# Patient Record
Sex: Female | Born: 1990 | Race: White | Hispanic: No | Marital: Married | Smoking: Current every day smoker
Health system: Southern US, Community
[De-identification: ages and names within clinical notes are randomized; demographics above are authoritative.]

## PROBLEM LIST (undated history)

## (undated) DIAGNOSIS — F4010 Social phobia, unspecified: Secondary | ICD-10-CM

## (undated) DIAGNOSIS — K219 Gastro-esophageal reflux disease without esophagitis: Secondary | ICD-10-CM

## (undated) DIAGNOSIS — J45909 Unspecified asthma, uncomplicated: Secondary | ICD-10-CM

## (undated) DIAGNOSIS — F319 Bipolar disorder, unspecified: Secondary | ICD-10-CM

## (undated) DIAGNOSIS — F411 Generalized anxiety disorder: Secondary | ICD-10-CM

## (undated) DIAGNOSIS — K589 Irritable bowel syndrome without diarrhea: Secondary | ICD-10-CM

## (undated) HISTORY — PX: FOOT SURGERY: SHX648

## (undated) HISTORY — DX: Gastro-esophageal reflux disease without esophagitis: K21.9

## (undated) HISTORY — DX: Social phobia, unspecified: F40.10

## (undated) HISTORY — PX: APPENDECTOMY: SHX54

## (undated) HISTORY — DX: Generalized anxiety disorder: F41.1

## (undated) HISTORY — DX: Bipolar disorder, unspecified: F31.9

## (undated) HISTORY — PX: BREAST LUMPECTOMY: SHX2

---

## 2010-10-31 DIAGNOSIS — M542 Cervicalgia: Secondary | ICD-10-CM | POA: Insufficient documentation

## 2014-01-26 DIAGNOSIS — R200 Anesthesia of skin: Secondary | ICD-10-CM | POA: Insufficient documentation

## 2014-01-26 DIAGNOSIS — R202 Paresthesia of skin: Secondary | ICD-10-CM

## 2014-03-18 DIAGNOSIS — N6310 Unspecified lump in the right breast, unspecified quadrant: Secondary | ICD-10-CM | POA: Insufficient documentation

## 2014-10-06 ENCOUNTER — Emergency Department (HOSPITAL_COMMUNITY): Payer: BC Managed Care – PPO

## 2014-10-06 ENCOUNTER — Emergency Department (HOSPITAL_COMMUNITY)
Admission: EM | Admit: 2014-10-06 | Discharge: 2014-10-06 | Disposition: A | Discharge status: Home / Self Care | Payer: BC Managed Care – PPO | Attending: Emergency Medicine | Admitting: Emergency Medicine

## 2014-10-06 ENCOUNTER — Encounter (HOSPITAL_COMMUNITY): Payer: Self-pay | Admitting: Emergency Medicine

## 2014-10-06 DIAGNOSIS — Z3202 Encounter for pregnancy test, result negative: Secondary | ICD-10-CM | POA: Diagnosis not present

## 2014-10-06 DIAGNOSIS — R0600 Dyspnea, unspecified: Secondary | ICD-10-CM | POA: Insufficient documentation

## 2014-10-06 DIAGNOSIS — R079 Chest pain, unspecified: Secondary | ICD-10-CM

## 2014-10-06 DIAGNOSIS — R0789 Other chest pain: Secondary | ICD-10-CM | POA: Diagnosis not present

## 2014-10-06 DIAGNOSIS — R0602 Shortness of breath: Secondary | ICD-10-CM

## 2014-10-06 DIAGNOSIS — Z880 Allergy status to penicillin: Secondary | ICD-10-CM | POA: Diagnosis not present

## 2014-10-06 LAB — D-DIMER, QUANTITATIVE: D-Dimer, Quant: 0.36 ug/mL-FEU (ref 0.00–0.48)

## 2014-10-06 LAB — BASIC METABOLIC PANEL
Anion gap: 10 (ref 5–15)
BUN: 8 mg/dL (ref 6–23)
CO2: 23 mmol/L (ref 19–32)
Calcium: 9.2 mg/dL (ref 8.4–10.5)
Chloride: 106 mmol/L (ref 96–112)
Creatinine, Ser: 0.71 mg/dL (ref 0.50–1.10)
GFR calc Af Amer: >90 mL/min (ref >90)
GFR calc non Af Amer: >90 mL/min (ref >90)
Glucose, Bld: 91 mg/dL (ref 70–99)
Potassium: 3.2 mmol/L — ABNORMAL LOW (ref 3.5–5.1)
Sodium: 139 mmol/L (ref 135–145)

## 2014-10-06 LAB — CBC
HCT: 39.5 % (ref 36.0–46.0)
Hemoglobin: 13.4 g/dL (ref 12.0–15.0)
MCH: 29.1 pg (ref 26.0–34.0)
MCHC: 33.9 g/dL (ref 30.0–36.0)
MCV: 85.7 fL (ref 78.0–100.0)
Platelets: 293 10*3/uL (ref 150–400)
RBC: 4.61 MIL/uL (ref 3.87–5.11)
RDW: 12.7 % (ref 11.5–15.5)
WBC: 11.5 10*3/uL — ABNORMAL HIGH (ref 4.0–10.5)

## 2014-10-06 LAB — BRAIN NATRIURETIC PEPTIDE: B Natriuretic Peptide: 16.8 pg/mL (ref 0.0–100.0)

## 2014-10-06 LAB — I-STAT TROPONIN, ED: Troponin i, poc: 0 ng/mL (ref 0.00–0.08)

## 2014-10-06 LAB — POC URINE PREG, ED: Preg Test, Ur: NEGATIVE

## 2014-10-06 LAB — PREGNANCY, URINE: Preg Test, Ur: NEGATIVE

## 2014-10-06 MED ORDER — POTASSIUM CHLORIDE CRYS ER 20 MEQ PO TBCR
40.0000 meq | EXTENDED_RELEASE_TABLET | Freq: Once | ORAL | Status: AC
  Administered 2014-10-06: 40 meq via ORAL
  Filled 2014-10-06: qty 2

## 2014-10-06 NOTE — ED Notes (Signed)
Patient reports SOB and chest pain starting Sunday after running. Went to [facility] where they diagnosed her with exercise induced asthma and prescribed an albuterol inhaler, but has found no relief.

## 2014-10-06 NOTE — Discharge Instructions (Signed)

## 2014-10-06 NOTE — ED Notes (Signed)
Pt. reports mid chest " heaviness"  with SOB onset Sunday this week seen at [facility] diagnosed with exercise induced asthma and was discharged home with prescription MDI . Pt. states persistent chest heaviness , SOB  with occasional dry cough .

## 2014-10-14 NOTE — ED Provider Notes (Signed)
CSN: [id number]     Arrival date & time 10/06/14  1905 History   First MD Initiated Contact with Patient 10/06/14 2058     No chief complaint on file.    (Consider location/radiation/quality/duration/timing/severity/associated sxs/prior Treatment) HPI   24 year old female with dyspnea. This is actually been ongoing for many months, but worse with the last few days to about a week. Symptoms worsened by exertion. Patient received an outside hospital recently and diagnosed with exercise-induced asthma. She was provided with an albuterol inhaler. She has tried using this feeling short of breath but has not had much relief with it. She describes her chest pain is a tightness. It feels like there is a band encircling her chest. No cough. No unusual leg pain or swelling. Denies past history of DVT/pulmonary embolism. No fevers or chills. Denies orthopnea.    History reviewed. No pertinent past medical history. Past Surgical History  Procedure Laterality Date  . Appendectomy    . Breast lumpectomy    . Foot surgery     No family history on file. History  Substance Use Topics  . Smoking status: Never Smoker   . Smokeless tobacco: Not on file  . Alcohol Use: No   OB History    No data available     Review of Systems  All systems reviewed and negative, other than as noted in HPI.   Allergies  Amoxicillin; Asa; and Sulfa antibiotics  Home Medications   Prior to Admission medications   Not on File   BP 120/72 mmHg  Pulse 106  Temp(Src) 98 F (36.7 C) (Oral)  Resp 12  Ht 5\' 3"  (1.6 m)  Wt 168 lb (76.204 kg)  BMI 29.77 kg/m2  SpO2 97%  LMP 09/29/2014 Physical Exam  Constitutional: She appears well-developed and well-nourished. No distress.  HENT:  Head: Normocephalic and atraumatic.  Eyes: Conjunctivae are normal. Right eye exhibits no discharge. Left eye exhibits no discharge.  Neck: Neck supple.  Cardiovascular: Normal rate, regular rhythm and normal heart sounds.  Exam  reveals no gallop and no friction rub.   No murmur heard. Pulmonary/Chest: Effort normal and breath sounds normal. No respiratory distress.  Abdominal: Soft. She exhibits no distension. There is no tenderness.  Musculoskeletal: She exhibits no edema or tenderness.  Lower extremities symmetric as compared to each other. No calf tenderness. Negative Homan's. No palpable cords.   Neurological: She is alert.  Skin: Skin is warm and dry.  Psychiatric: She has a normal mood and affect. Her behavior is normal. Thought content normal.  Nursing note and vitals reviewed.   ED Course  Procedures (including critical care time) Labs Review Labs Reviewed  CBC - Abnormal; Notable for the following:    WBC 11.5 (*)    All other components within normal limits  BASIC METABOLIC PANEL - Abnormal; Notable for the following:    Potassium 3.2 (*)    All other components within normal limits  BRAIN NATRIURETIC PEPTIDE  D-DIMER, QUANTITATIVE  PREGNANCY, URINE  I-STAT TROPOININ, ED  POC URINE PREG, ED    Imaging Review No results found.   Dg Chest 2 View  10/06/2014   CLINICAL DATA:  Chest pain and shortness of breath for the past 3 days.  EXAM: CHEST  2 VIEW  COMPARISON:  None.  FINDINGS: Normal sized heart.  Clear lungs.  Minimal scoliosis.  IMPRESSION: No acute abnormality.   Electronically Signed   By: Claudie Revering M.D.   On: 10/06/2014 20:01  EKG Interpretation   Date/Time:  Tuesday October 06 2014 19:15:55 EDT Ventricular Rate:  98 PR Interval:  142 QRS Duration: 72 QT Interval:  360 QTC Calculation: 459 R Axis:   46 Text Interpretation:  Normal sinus rhythm with sinus arrhythmia Normal ECG  Confirmed by Wilson Singer  MD, Tymeshia Awan ([id number]) on 10/06/2014 9:33:27 PM      MDM   Final diagnoses:  Dyspnea  Chest pain, unspecified chest pain type    24 year old female with chest pain and history of unknown etiology.. Low suspicion for ACS. Minimally tachycardic, but unlikely PE D-dimer is normal.  No clinical signs or symptoms of DVT Chest x-ray without acute abnormality. She is afebrile. She does have a mild leukocytosis, but this is nonspecific. The potentially exercise-induced asthma as previously diagnosed. I currently do not appreciate any wheezing on my exam. Consider reflux. Discussed with patient that she may benefit from a trial of PPI or H2 blocker.It has been determined that no acute conditions requiring further emergency intervention are present at this time. The patient has been advised of the diagnosis and plan. I reviewed any labs and imaging including any potential incidental findings. We have discussed signs and symptoms that warrant return to the ED and they are listed in the discharge instructions.      Virgel Manifold, MD 10/14/14 [phone]

## 2015-07-05 ENCOUNTER — Encounter (HOSPITAL_COMMUNITY): Payer: Self-pay | Admitting: Emergency Medicine

## 2015-07-05 ENCOUNTER — Emergency Department (HOSPITAL_COMMUNITY): Payer: 59

## 2015-07-05 ENCOUNTER — Emergency Department (HOSPITAL_COMMUNITY)
Admission: EM | Admit: 2015-07-05 | Discharge: 2015-07-05 | Disposition: A | Discharge status: Home / Self Care | Payer: 59 | Attending: Emergency Medicine | Admitting: Emergency Medicine

## 2015-07-05 DIAGNOSIS — Z7951 Long term (current) use of inhaled steroids: Secondary | ICD-10-CM | POA: Insufficient documentation

## 2015-07-05 DIAGNOSIS — Z3202 Encounter for pregnancy test, result negative: Secondary | ICD-10-CM | POA: Insufficient documentation

## 2015-07-05 DIAGNOSIS — R0602 Shortness of breath: Secondary | ICD-10-CM | POA: Diagnosis present

## 2015-07-05 DIAGNOSIS — J209 Acute bronchitis, unspecified: Secondary | ICD-10-CM

## 2015-07-05 DIAGNOSIS — Z880 Allergy status to penicillin: Secondary | ICD-10-CM | POA: Diagnosis not present

## 2015-07-05 DIAGNOSIS — J45901 Unspecified asthma with (acute) exacerbation: Secondary | ICD-10-CM | POA: Diagnosis not present

## 2015-07-05 DIAGNOSIS — Z79899 Other long term (current) drug therapy: Secondary | ICD-10-CM | POA: Diagnosis not present

## 2015-07-05 HISTORY — DX: Unspecified asthma, uncomplicated: J45.909

## 2015-07-05 LAB — CBC WITH DIFFERENTIAL/PLATELET
BASOS ABS: 0 10*3/uL (ref 0.0–0.1)
BASOS PCT: 0 %
EOS PCT: 3 %
Eosinophils Absolute: 0.4 10*3/uL (ref 0.0–0.7)
HCT: 41.9 % (ref 36.0–46.0)
Hemoglobin: 14.6 g/dL (ref 12.0–15.0)
Lymphocytes Relative: 19 %
Lymphs Abs: 2.2 10*3/uL (ref 0.7–4.0)
MCH: 29.4 pg (ref 26.0–34.0)
MCHC: 34.8 g/dL (ref 30.0–36.0)
MCV: 84.3 fL (ref 78.0–100.0)
MONO ABS: 1.2 10*3/uL — AB (ref 0.1–1.0)
Monocytes Relative: 10 %
Neutro Abs: 7.8 10*3/uL — ABNORMAL HIGH (ref 1.7–7.7)
Neutrophils Relative %: 68 %
PLATELETS: 292 10*3/uL (ref 150–400)
RBC: 4.97 MIL/uL (ref 3.87–5.11)
RDW: 13.1 % (ref 11.5–15.5)
WBC: 11.6 10*3/uL — ABNORMAL HIGH (ref 4.0–10.5)

## 2015-07-05 LAB — BASIC METABOLIC PANEL
ANION GAP: 9 (ref 5–15)
BUN: 6 mg/dL (ref 6–20)
CO2: 26 mmol/L (ref 22–32)
Calcium: 9.2 mg/dL (ref 8.9–10.3)
Chloride: 102 mmol/L (ref 101–111)
Creatinine, Ser: 0.65 mg/dL (ref 0.44–1.00)
GFR calc Af Amer: >60 mL/min (ref >60)
GFR calc non Af Amer: >60 mL/min (ref >60)
Glucose, Bld: 96 mg/dL (ref 65–99)
Potassium: 3.4 mmol/L — ABNORMAL LOW (ref 3.5–5.1)
Sodium: 137 mmol/L (ref 135–145)

## 2015-07-05 LAB — POC URINE PREG, ED: Preg Test, Ur: NEGATIVE

## 2015-07-05 LAB — TROPONIN I

## 2015-07-05 LAB — D-DIMER, QUANTITATIVE (NOT AT ARMC): D DIMER QUANT: 0.27 ug{FEU}/mL (ref 0.00–0.50)

## 2015-07-05 MED ORDER — HYDROCOD POLST-CPM POLST ER 10-8 MG/5ML PO SUER: 5.0000 mL | Freq: Two times a day (BID) | ORAL | Status: DC | PRN

## 2015-07-05 MED ORDER — HYDROCOD POLST-CPM POLST ER 10-8 MG/5ML PO SUER
5.0000 mL | Freq: Once | ORAL | Status: AC
  Administered 2015-07-05: 5 mL via ORAL
  Filled 2015-07-05: qty 5

## 2015-07-05 MED ORDER — PREDNISONE 50 MG PO TABS
60.0000 mg | ORAL_TABLET | Freq: Once | ORAL | Status: AC
  Administered 2015-07-05: 60 mg via ORAL
  Filled 2015-07-05: qty 1

## 2015-07-05 MED ORDER — BENZONATATE 100 MG PO CAPS
200.0000 mg | ORAL_CAPSULE | Freq: Once | ORAL | Status: DC
Start: 2015-07-05 — End: 2015-07-05

## 2015-07-05 MED ORDER — PREDNISONE 50 MG PO TABS: ORAL_TABLET | ORAL | Status: DC

## 2015-07-05 MED ORDER — AZITHROMYCIN 250 MG PO TABS: ORAL_TABLET | ORAL | Status: DC

## 2015-07-05 NOTE — ED Provider Notes (Signed)
CSN: [id number]     Arrival date & time 07/05/15  1209 History  By signing my name below, I, Randa Evens, attest that this documentation has been prepared under the direction and in the presence of [employer], PA-C. Electronically Signed: Randa Evens, ED Scribe. 07/05/2015. 4:49 PM.      Chief Complaint  Patient presents with  . Asthma   The history is provided by the patient. No language interpreter was used.   HPI Comments: Damani Bardin is a 25 y.o. female who presents to the Emergency Department complaining of  Productive cough onset 2 days prior. Pt states he has had associated chest pain, SOB and wheezing. She also reports subjective fever and chills. She describes the chest pain as a pressure on her chest that's worse when coughing but constant at rest. Pt states that she has a Hx of Asthma. She states that she has recently been diagnosed with asthma 6 months prior. She states that her asthmas has been exasperated the last 3 days. She states she has had transient relief with her rescue inhaler and at home breathing treatments. Pt denies, nausea, vomiting, leg swelling but endorses pain in her left calf.  She denies periods of prolonged inactivity, except yesterday when she spent the day sleeping.  Pt also reports URI symptoms 3 weeks prior that improved after taking OTC medications.LMP 2 weeks prior. Pt denies Hx of birth control use.     Past Medical History  Diagnosis Date  . Asthma    Past Surgical History  Procedure Laterality Date  . Appendectomy    . Breast lumpectomy    . Foot surgery     Family History  Problem Relation Age of Onset  . Asthma Father   . Diabetes Other   . Heart failure Other    Social History  Substance Use Topics  . Smoking status: Never Smoker   . Smokeless tobacco: Never Used  . Alcohol Use: No   OB History    No data available      Review of Systems  Constitutional: Positive for fever and chills.  Respiratory: Positive for cough,  shortness of breath and wheezing.   Cardiovascular: Positive for chest pain. Negative for leg swelling.  Gastrointestinal: Negative for vomiting.     Allergies  Amoxicillin; Asa; and Sulfa antibiotics  Home Medications   Prior to Admission medications   Medication Sig Start Date End Date Taking? Authorizing Provider  acetaminophen (TYLENOL) 500 MG tablet Take 500 mg by mouth every 6 (six) hours as needed.   Yes Historical Provider, MD  albuterol (PROVENTIL) (2.5 MG/3ML) 0.083% nebulizer solution Take 2.5 mg by nebulization every 6 (six) hours as needed for wheezing or shortness of breath.   Yes Historical Provider, MD  Albuterol Sulfate (PROAIR RESPICLICK) 123XX123 (90 Base) MCG/ACT AEPB Inhale 1-2 puffs into the lungs every 6 (six) hours as needed (shortness of breath).   Yes Historical Provider, MD  fluticasone (FLONASE) 50 MCG/ACT nasal spray Place 2 sprays into both nostrils daily as needed for allergies or rhinitis.   Yes Historical Provider, MD  Fluticasone Furoate-Vilanterol (BREO ELLIPTA) 100-25 MCG/INH AEPB Inhale 1 puff into the lungs daily.   Yes Historical Provider, MD  levocetirizine (XYZAL) 5 MG tablet Take 5 mg by mouth every evening.   Yes Historical Provider, MD  montelukast (SINGULAIR) 10 MG tablet Take 10 mg by mouth at bedtime.   Yes Historical Provider, MD  Phenylephrine-DM-GG-APAP (MUCINEX FAST-MAX COLD FLU) 5-10-200-325 MG/10ML LIQD Take 5-10  mLs by mouth daily as needed (for cold and cough).   Yes Historical Provider, MD  azithromycin (ZITHROMAX Z-PAK) 250 MG tablet Take 2 tablets by mouth on day one followed by one tablet daily for 4 days. 07/05/15   Evalee Jefferson, PA-C  chlorpheniramine-HYDROcodone (TUSSIONEX PENNKINETIC ER) 10-8 MG/5ML SUER Take 5 mLs by mouth every 12 (twelve) hours as needed for cough. 07/05/15   Evalee Jefferson, PA-C  predniSONE (DELTASONE) 50 MG tablet 1 tab PO daily for 4 days 07/05/15   Evalee Jefferson, PA-C   BP 96/61 mmHg  Pulse 106  Temp(Src) 98.4 F (36.9 C)  (Oral)  Resp 18  Ht 5\' 3"  (1.6 m)  Wt 76.658 kg  BMI 29.94 kg/m2  SpO2 100%  LMP 06/21/2015   Physical Exam  Constitutional: She is oriented to person, place, and time. She appears well-developed and well-nourished. No distress.  HENT:  Head: Normocephalic and atraumatic.  Eyes: Conjunctivae and EOM are normal.  Neck: Neck supple. No tracheal deviation present.  Cardiovascular: Normal rate.   Pulmonary/Chest: Effort normal. No respiratory distress. She has wheezes. She exhibits no tenderness.  Faint wheeze at left base.   Abdominal: Soft. She exhibits no distension. There is no tenderness.  Musculoskeletal: Normal range of motion.  Tenderness along left medial calf without edema, induration, cords or erythema, dorsalis pedal pulses are full bilaterally , no ankle edema.  Negative Homan's.  Neurological: She is alert and oriented to person, place, and time.  Skin: Skin is warm and dry.  Psychiatric: She has a normal mood and affect. Her behavior is normal.  Nursing note and vitals reviewed.   ED Course  Procedures (including critical care time) DIAGNOSTIC STUDIES: Oxygen Saturation is 98% on RA, normal by my interpretation.    COORDINATION OF CARE: 3:17 PM-Discussed treatment plan with pt at bedside and pt agreed to plan.     Labs Review Labs Reviewed  CBC WITH DIFFERENTIAL/PLATELET - Abnormal; Notable for the following:    WBC 11.6 (*)    Neutro Abs 7.8 (*)    Monocytes Absolute 1.2 (*)    All other components within normal limits  BASIC METABOLIC PANEL - Abnormal; Notable for the following:    Potassium 3.4 (*)    All other components within normal limits  D-DIMER, QUANTITATIVE (NOT AT [facility])  TROPONIN I  POC URINE PREG, ED    Imaging Review Dg Chest 2 View  07/05/2015  CLINICAL DATA:  25 year old female with cough, shortness of breath and chest pain for 2 days. EXAM: CHEST  2 VIEW COMPARISON:  11/29/2014. FINDINGS: The cardiomediastinal silhouette is unremarkable.  There is no evidence of focal airspace disease, pulmonary edema, suspicious pulmonary nodule/mass, pleural effusion, or pneumothorax. No acute bony abnormalities are identified. IMPRESSION: No active cardiopulmonary disease. Electronically Signed   By: Margarette Canada M.D.   On: 07/05/2015 14:45      EKG Interpretation   Date/Time:  Monday July 05 2015 15:24:09 EST Ventricular Rate:  98 PR Interval:  140 QRS Duration: 76 QT Interval:  334 QTC Calculation: 426 R Axis:   12 Text Interpretation:  Normal sinus rhythm Nonspecific ST abnormality  Abnormal ECG No significant change since last tracing Confirmed by KNAPP   MD-J, JON [id number]) on 07/05/2015 3:32:27 PM      MDM   Final diagnoses:  Acute bronchitis, unspecified organism    Patients labs reviewed.  Radiological studies were viewed, interpreted and considered during the medical decision making and disposition process. I agree  with radiologists reading.  Results were also discussed with patient. Pt with low risk for PE, low risk Wells score, normal range d dimer. Pt with acute bronchitis/ asthma flare.  She was given prednisone, prescribed pulse dosing.  Also will cover with z pack for bronchitis given asthma hx and lingering sx.  tussionex for cough, advised to continue other home meds.  Suspect calf soreness is muscle strain, no other physical exam findings to suggest dvt.    I personally performed the services described in this documentation, which was scribed in my presence. The recorded information has been reviewed and is accurate.     Evalee Jefferson, PA-C 07/05/15 1709  Dorie Rank, MD 07/08/15 [phone]

## 2015-07-05 NOTE — ED Notes (Signed)
Pt reports cough with greenish yellow sputum. Pt reports hx of asthma, states 3 attacks in last few days. NAD in Triage, O2 sat 98% on RA.

## 2015-07-05 NOTE — Discharge Instructions (Signed)
Acute Bronchitis Bronchitis is inflammation of the airways that extend from the windpipe into the lungs (bronchi). The inflammation often causes mucus to develop. This leads to a cough, which is the most common symptom of bronchitis.  In acute bronchitis, the condition usually develops suddenly and goes away over time, usually in a couple weeks. Smoking, allergies, and asthma can make bronchitis worse. Repeated episodes of bronchitis may cause further lung problems.  CAUSES Acute bronchitis is most often caused by the same virus that causes a cold. The virus can spread from person to person (contagious) through coughing, sneezing, and touching contaminated objects. SIGNS AND SYMPTOMS   Cough.   Fever.   Coughing up mucus.   Body aches.   Chest congestion.   Chills.   Shortness of breath.   Sore throat.  DIAGNOSIS  Acute bronchitis is usually diagnosed through a physical exam. Your health care provider will also ask you questions about your medical history. Tests, such as chest X-rays, are sometimes done to rule out other conditions.  TREATMENT  Acute bronchitis usually goes away in a couple weeks. Oftentimes, no medical treatment is necessary. Medicines are sometimes given for relief of fever or cough. Antibiotic medicines are usually not needed but may be prescribed in certain situations. In some cases, an inhaler may be recommended to help reduce shortness of breath and control the cough. A cool mist vaporizer may also be used to help thin bronchial secretions and make it easier to clear the chest.  HOME CARE INSTRUCTIONS  Get plenty of rest.   Drink enough fluids to keep your urine clear or pale yellow (unless you have a medical condition that requires fluid restriction). Increasing fluids may help thin your respiratory secretions (sputum) and reduce chest congestion, and it will prevent dehydration.   Take medicines only as directed by your health care provider.  If  you were prescribed an antibiotic medicine, finish it all even if you start to feel better.  Avoid smoking and secondhand smoke. Exposure to cigarette smoke or irritating chemicals will make bronchitis worse. If you are a smoker, consider using nicotine gum or skin patches to help control withdrawal symptoms. Quitting smoking will help your lungs heal faster.   Reduce the chances of another bout of acute bronchitis by washing your hands frequently, avoiding people with cold symptoms, and trying not to touch your hands to your mouth, nose, or eyes.   Keep all follow-up visits as directed by your health care provider.  SEEK MEDICAL CARE IF: Your symptoms do not improve after 1 week of treatment.  SEEK IMMEDIATE MEDICAL CARE IF:  You develop an increased fever or chills.   You have chest pain.   You have severe shortness of breath.  You have bloody sputum.   You develop dehydration.  You faint or repeatedly feel like you are going to pass out.  You develop repeated vomiting.  You develop a severe headache. MAKE SURE YOU:   Understand these instructions.  Will watch your condition.  Will get help right away if you are not doing well or get worse.   This information is not intended to replace advice given to you by your health care provider. Make sure you discuss any questions you have with your health care provider.   Document Released: 07/27/2004 Document Revised: 07/10/2014 Document Reviewed: 12/10/2012 Elsevier Interactive Patient Education 2016 [place] your next dose of prednisone tomorrow afternoon.  Continue using your home inhaler medicines.  Do not drive within 4 hours of taking tussionex as this contains a narcotic and can make you sleepy.

## 2016-06-03 ENCOUNTER — Emergency Department (HOSPITAL_COMMUNITY)
Admission: EM | Admit: 2016-06-03 | Discharge: 2016-06-03 | Disposition: A | Discharge status: Home / Self Care | Payer: 59 | Attending: Emergency Medicine | Admitting: Emergency Medicine

## 2016-06-03 ENCOUNTER — Encounter (HOSPITAL_COMMUNITY): Payer: Self-pay | Admitting: Emergency Medicine

## 2016-06-03 DIAGNOSIS — Z79899 Other long term (current) drug therapy: Secondary | ICD-10-CM | POA: Diagnosis not present

## 2016-06-03 DIAGNOSIS — J45909 Unspecified asthma, uncomplicated: Secondary | ICD-10-CM | POA: Insufficient documentation

## 2016-06-03 DIAGNOSIS — R110 Nausea: Secondary | ICD-10-CM | POA: Diagnosis not present

## 2016-06-03 DIAGNOSIS — J029 Acute pharyngitis, unspecified: Secondary | ICD-10-CM | POA: Diagnosis not present

## 2016-06-03 LAB — RAPID STREP SCREEN (MED CTR MEBANE ONLY): Streptococcus, Group A Screen (Direct): NEGATIVE

## 2016-06-03 MED ORDER — HYDROCODONE-ACETAMINOPHEN 7.5-325 MG/15ML PO SOLN: 7.5000 mL | Freq: Four times a day (QID) | ORAL | 0 refills | Status: DC | PRN

## 2016-06-03 NOTE — ED Triage Notes (Signed)
Pt reports sore throat since Thursday, looked at throat today and saw "bumps". Denies fever or difficulty swallowing, airway patent. Reports N/ and HA.

## 2016-06-03 NOTE — ED Provider Notes (Signed)
[facility] DEPT Provider Note   CSN: [id number] Arrival date & time: 06/03/16  1351     History   Chief Complaint Chief Complaint  Patient presents with  . Sore Throat    HPI Nicole Rodriguez is a 25 y.o. female.  HPI Patient presents with 2 days of sore throat. Saw some white bumps today. Pain with swallowing. Did not check her temperature percent feels if she had a fever. Denies possibly pregnancy. Has had slight nausea and headache. No cough. No sick contacts. She does have a young child at home but they are not sick now.   Past Medical History:  Diagnosis Date  . Asthma     There are no active problems to display for this patient.   Past Surgical History:  Procedure Laterality Date  . APPENDECTOMY    . BREAST LUMPECTOMY    . FOOT SURGERY      OB History    No data available       Home Medications    Prior to Admission medications   Medication Sig Start Date End Date Taking? Authorizing Provider  acetaminophen (TYLENOL) 500 MG tablet Take 500 mg by mouth every 6 (six) hours as needed.   Yes Historical Provider, MD  albuterol (PROVENTIL) (2.5 MG/3ML) 0.083% nebulizer solution Take 2.5 mg by nebulization every 6 (six) hours as needed for wheezing or shortness of breath.   Yes Historical Provider, MD  Albuterol Sulfate (PROAIR RESPICLICK) 123XX123 (90 Base) MCG/ACT AEPB Inhale 1-2 puffs into the lungs every 6 (six) hours as needed (shortness of breath).   Yes Historical Provider, MD  fluticasone (FLONASE) 50 MCG/ACT nasal spray Place 2 sprays into both nostrils daily as needed for allergies or rhinitis.   Yes Historical Provider, MD  Fluticasone Furoate-Vilanterol (BREO ELLIPTA) 100-25 MCG/INH AEPB Inhale 1 puff into the lungs daily.   Yes Historical Provider, MD  levocetirizine (XYZAL) 5 MG tablet Take 5 mg by mouth every evening.   Yes Historical Provider, MD  linaclotide (LINZESS) 145 MCG CAPS capsule Take 145 mcg by mouth daily before breakfast.   Yes Historical  Provider, MD  montelukast (SINGULAIR) 10 MG tablet Take 10 mg by mouth at bedtime.   Yes Historical Provider, MD  HYDROcodone-acetaminophen (HYCET) 7.5-325 mg/15 ml solution Take 7.5-15 mLs by mouth every 6 (six) hours as needed for moderate pain. 06/03/16   Davonna Belling, MD    Family History Family History  Problem Relation Age of Onset  . Asthma Father   . Diabetes Other   . Heart failure Other     Social History Social History  Substance Use Topics  . Smoking status: Never Smoker  . Smokeless tobacco: Never Used  . Alcohol use No     Allergies   Cefoxitin; Amoxicillin; Asa [aspirin]; and Sulfa antibiotics   Review of Systems Review of Systems  Constitutional: Negative for appetite change.  HENT: Positive for sore throat. Negative for congestion.   Respiratory: Negative for cough and shortness of breath.   Cardiovascular: Negative for chest pain.  Gastrointestinal: Positive for nausea.  Musculoskeletal: Negative for back pain.  Skin: Negative for rash.  Hematological: Negative for adenopathy.  Psychiatric/Behavioral: Negative for confusion.     Physical Exam Updated Vital Signs BP 129/80 (BP Location: Left Arm)   Pulse 95   Temp 97.8 F (36.6 C) (Oral)   Resp 18   Ht 5\' 3"  (1.6 m)   Wt 185 lb (83.9 kg)   LMP 05/18/2016   SpO2 100%  BMI 32.77 kg/m   Physical Exam  Constitutional: She appears well-developed.  HENT:  Head: Atraumatic.  Anterior cervical adenopathy. Posterior pharyngeal erethema without exudate. Uvula midline  Cardiovascular: Normal rate.   Pulmonary/Chest: Effort normal.  Lymphadenopathy:    She has cervical adenopathy.  Neurological: She is alert.  Skin: Capillary refill takes less than 2 seconds.  Psychiatric: She has a normal mood and affect.     ED Treatments / Results  Labs (all labs ordered are listed, but only abnormal results are displayed) Labs Reviewed  RAPID STREP SCREEN (NOT AT [facility])  CULTURE, GROUP A STREP  [facility])    EKG  EKG Interpretation None       Radiology No results found.  Procedures Procedures (including critical care time)  Medications Ordered in ED Medications - No data to display   Initial Impression / Assessment and Plan / ED Course  I have reviewed the triage vital signs and the nursing notes.  Pertinent labs & imaging results that were available during my care of the patient were reviewed by me and considered in my medical decision making (see chart for details).  Clinical Course     Patient with sore throat. Negative strep test. No peritonsillar abscess. Will treat symptomatically.  Final Clinical Impressions(s) / ED Diagnoses   Final diagnoses:  Pharyngitis, unspecified etiology    New Prescriptions Discharge Medication List as of 06/03/2016  2:48 PM    START taking these medications   Details  HYDROcodone-acetaminophen (HYCET) 7.5-325 mg/15 ml solution Take 7.5-15 mLs by mouth every 6 (six) hours as needed for moderate pain., Starting Sat 06/03/2016, Print         Davonna Belling, MD 06/03/16 2107

## 2016-06-06 LAB — CULTURE, GROUP A STREP (THRC)

## 2016-07-12 DIAGNOSIS — M5412 Radiculopathy, cervical region: Secondary | ICD-10-CM | POA: Diagnosis not present

## 2016-08-25 DIAGNOSIS — R29898 Other symptoms and signs involving the musculoskeletal system: Secondary | ICD-10-CM | POA: Diagnosis not present

## 2016-08-25 DIAGNOSIS — M6281 Muscle weakness (generalized): Secondary | ICD-10-CM | POA: Diagnosis not present

## 2016-08-25 DIAGNOSIS — M5410 Radiculopathy, site unspecified: Secondary | ICD-10-CM | POA: Diagnosis not present

## 2016-08-25 DIAGNOSIS — M5412 Radiculopathy, cervical region: Secondary | ICD-10-CM | POA: Diagnosis not present

## 2016-08-28 DIAGNOSIS — G8929 Other chronic pain: Secondary | ICD-10-CM | POA: Diagnosis not present

## 2016-08-28 DIAGNOSIS — M5412 Radiculopathy, cervical region: Secondary | ICD-10-CM | POA: Diagnosis not present

## 2016-08-28 DIAGNOSIS — M791 Myalgia: Secondary | ICD-10-CM | POA: Diagnosis not present

## 2016-09-07 DIAGNOSIS — M79602 Pain in left arm: Secondary | ICD-10-CM | POA: Diagnosis not present

## 2016-09-07 DIAGNOSIS — R202 Paresthesia of skin: Secondary | ICD-10-CM | POA: Diagnosis not present

## 2016-09-12 DIAGNOSIS — M5090 Cervical disc disorder, unspecified, unspecified cervical region: Secondary | ICD-10-CM | POA: Diagnosis not present

## 2016-09-14 ENCOUNTER — Encounter: Payer: Self-pay | Admitting: Obstetrics and Gynecology

## 2016-09-14 ENCOUNTER — Ambulatory Visit (INDEPENDENT_AMBULATORY_CARE_PROVIDER_SITE_OTHER): Payer: 59 | Admitting: Obstetrics and Gynecology

## 2016-09-14 VITALS — BP 126/74 | HR 112 | Ht 63.0 in | Wt 188.0 lb

## 2016-09-14 DIAGNOSIS — Z6833 Body mass index (BMI) 33.0-33.9, adult: Secondary | ICD-10-CM | POA: Diagnosis not present

## 2016-09-14 DIAGNOSIS — J3089 Other allergic rhinitis: Secondary | ICD-10-CM | POA: Diagnosis not present

## 2016-09-14 DIAGNOSIS — Z13220 Encounter for screening for lipoid disorders: Secondary | ICD-10-CM

## 2016-09-14 DIAGNOSIS — Z131 Encounter for screening for diabetes mellitus: Secondary | ICD-10-CM

## 2016-09-14 DIAGNOSIS — Z124 Encounter for screening for malignant neoplasm of cervix: Secondary | ICD-10-CM | POA: Diagnosis not present

## 2016-09-14 DIAGNOSIS — Z01419 Encounter for gynecological examination (general) (routine) without abnormal findings: Secondary | ICD-10-CM

## 2016-09-14 DIAGNOSIS — J4541 Moderate persistent asthma with (acute) exacerbation: Secondary | ICD-10-CM | POA: Diagnosis not present

## 2016-09-14 DIAGNOSIS — Z1329 Encounter for screening for other suspected endocrine disorder: Secondary | ICD-10-CM

## 2016-09-14 DIAGNOSIS — E669 Obesity, unspecified: Secondary | ICD-10-CM

## 2016-09-14 NOTE — Patient Instructions (Signed)
 Preventive Care 18-39 Years, Female Preventive care refers to lifestyle choices and visits with your health care provider that can promote health and wellness. What does preventive care include?  A yearly physical exam. This is also called an annual well check.  Dental exams once or twice a year.  Routine eye exams. Ask your health care provider how often you should have your eyes checked.  Personal lifestyle choices, including:  Daily care of your teeth and gums.  Regular physical activity.  Eating a healthy diet.  Avoiding tobacco and drug use.  Limiting alcohol use.  Practicing safe sex.  Taking vitamin and mineral supplements as recommended by your health care provider. What happens during an annual well check? The services and screenings done by your health care provider during your annual well check will depend on your age, overall health, lifestyle risk factors, and family history of disease. Counseling  Your health care provider may ask you questions about your:  Alcohol use.  Tobacco use.  Drug use.  Emotional well-being.  Home and relationship well-being.  Sexual activity.  Eating habits.  Work and work environment.  Method of birth control.  Menstrual cycle.  Pregnancy history. Screening  You may have the following tests or measurements:  Height, weight, and BMI.  Diabetes screening. This is done by checking your blood sugar (glucose) after you have not eaten for a while (fasting).  Blood pressure.  Lipid and cholesterol levels. These may be checked every 5 years starting at age 20.  Skin check.  Hepatitis C blood test.  Hepatitis B blood test.  Sexually transmitted disease (STD) testing.  BRCA-related cancer screening. This may be done if you have a family history of breast, ovarian, tubal, or peritoneal cancers.  Pelvic exam and Pap test. This may be done every 3 years starting at age 21. Starting at age 30, this may be done  every 5 years if you have a Pap test in combination with an HPV test. Discuss your test results, treatment options, and if necessary, the need for more tests with your health care provider. Vaccines  Your health care provider may recommend certain vaccines, such as:  Influenza vaccine. This is recommended every year.  Tetanus, diphtheria, and acellular pertussis (Tdap, Td) vaccine. You may need a Td booster every 10 years.  Varicella vaccine. You may need this if you have not been vaccinated.  HPV vaccine. If you are 26 or younger, you may need three doses over 6 months.  Measles, mumps, and rubella (MMR) vaccine. You may need at least one dose of MMR. You may also need a second dose.  Pneumococcal 13-valent conjugate (PCV13) vaccine. You may need this if you have certain conditions and were not previously vaccinated.  Pneumococcal polysaccharide (PPSV23) vaccine. You may need one or two doses if you smoke cigarettes or if you have certain conditions.  Meningococcal vaccine. One dose is recommended if you are age 19-21 years and a first-year college student living in a residence hall, or if you have one of several medical conditions. You may also need additional booster doses.  Hepatitis A vaccine. You may need this if you have certain conditions or if you travel or work in places where you may be exposed to hepatitis A.  Hepatitis B vaccine. You may need this if you have certain conditions or if you travel or work in places where you may be exposed to hepatitis B.  Haemophilus influenzae type b (Hib) vaccine. You may need   this if you have certain risk factors. Talk to your health care provider about which screenings and vaccines you need and how often you need them. This information is not intended to replace advice given to you by your health care provider. Make sure you discuss any questions you have with your health care provider. Document Released: 08/15/2001 Document Revised:  03/08/2016 Document Reviewed: 04/20/2015 Elsevier Interactive Patient Education  2017 [employer] Inc.  

## 2016-09-14 NOTE — Progress Notes (Addendum)
Patient ID: Nicole Rodriguez, female   DOB: [birth year], 26 y.o.   MRN: [id number]     Gynecology Annual Exam  PCP: No PCP Per Patient  Chief Complaint:  Chief Complaint  Patient presents with  . Gynecologic Exam    History of Present Illness: Patient is a 26 y.o. G0P0000 presents for annual exam. The patient has no complaints today.   LMP: Patient's last menstrual period was 08/17/2016 (approximate). Average Interval: regular, 30 days Duration of flow: 3 days Heavy Menses: no Clots: no Intermenstrual Bleeding: no Postcoital Bleeding: not applicable Dysmenorrhea: no  The patient is sexually active. She currently uses None for contraception (monogamous same sex relationship).  The patient does perform self breast exams.  There is no notable family history of breast or ovarian cancer in her family.  The patient wears seatbelts: yes.  The patient has regular exercise: not asked.    The patient denies current symptoms of depression.    Review of Systems: Review of Systems  Constitutional: Negative for chills and fever.  HENT: Negative for congestion.   Respiratory: Negative for cough and shortness of breath.   Cardiovascular: Negative for chest pain and palpitations.  Gastrointestinal: Negative for abdominal pain, constipation, diarrhea, heartburn, nausea and vomiting.  Genitourinary: Negative for dysuria, frequency and urgency.  Skin: Negative for itching and rash.  Neurological: Negative for dizziness and headaches.  Endo/Heme/Allergies: Negative for polydipsia.  Psychiatric/Behavioral: Negative for depression.    Past Medical History:  Past Medical History:  Diagnosis Date  . Asthma     Past Surgical History:  Past Surgical History:  Procedure Laterality Date  . APPENDECTOMY    . BREAST LUMPECTOMY    . FOOT SURGERY      Gynecologic History:  Patient's last menstrual period was 08/17/2016 (approximate). Contraception: none Last Pap: Results were: no abnormalities    Obstetric History: G0P0000  Family History:  Family History  Problem Relation Age of Onset  . Asthma Father   . Diabetes Other   . Heart failure Other     Social History:  Social History   Social History  . Marital status: Married    Spouse name: N/A  . Number of children: N/A  . Years of education: N/A   Occupational History  . Not on file.   Social History Main Topics  . Smoking status: Never Smoker  . Smokeless tobacco: Never Used  . Alcohol use No  . Drug use: No  . Sexual activity: Yes    Birth control/ protection: None   Other Topics Concern  . Not on file   Social History Narrative  . No narrative on file    Allergies:  Allergies  Allergen Reactions  . Cefoxitin Itching and Rash  . Amoxicillin Hives  . Asa [Aspirin] Hives  . Sulfa Antibiotics Hives    Medications: Prior to Admission medications   Medication Sig Start Date End Date Taking? Authorizing Provider  acetaminophen (TYLENOL) 500 MG tablet Take 500 mg by mouth every 6 (six) hours as needed.   Yes Historical Provider, MD  albuterol (PROVENTIL) (2.5 MG/3ML) 0.083% nebulizer solution Take 2.5 mg by nebulization every 6 (six) hours as needed for wheezing or shortness of breath.   Yes Historical Provider, MD  Albuterol Sulfate (PROAIR RESPICLICK) 211 (90 Base) MCG/ACT AEPB Inhale 1-2 puffs into the lungs every 6 (six) hours as needed (shortness of breath).   Yes Historical Provider, MD  fluticasone (FLONASE) 50 MCG/ACT nasal spray Place 2 sprays into both nostrils  daily as needed for allergies or rhinitis.   Yes Historical Provider, MD  Fluticasone Furoate-Vilanterol (BREO ELLIPTA) 100-25 MCG/INH AEPB Inhale 1 puff into the lungs daily.   Yes Historical Provider, MD  levocetirizine (XYZAL) 5 MG tablet Take 5 mg by mouth every evening.   Yes Historical Provider, MD  montelukast (SINGULAIR) 10 MG tablet Take 10 mg by mouth at bedtime.   Yes Historical Provider, MD    Physical Exam Vitals: Blood  pressure 126/74, pulse (!) 112, height 5\' 3"  (1.6 m), weight 188 lb (85.3 kg), last menstrual period 08/17/2016. Body mass index is 33.3 kg/m.   General: NAD HEENT: normocephalic, anicteric Thyroid: no enlargement, no palpable nodules Pulmonary: No increased work of breathing, CTAB Cardiovascular: RRR, distal pulses 2+ Breast: Breast symmetrical, no tenderness, no palpable nodules or masses, no skin or nipple retraction present, no nipple discharge.  No axillary or supraclavicular lymphadenopathy. Abdomen: NABS, soft, non-tender, non-distended.  Umbilicus without lesions.  No hepatomegaly, splenomegaly or masses palpable. No evidence of hernia  Genitourinary:  External: Normal external female genitalia.  Normal urethral meatus, normal  Bartholin's and Skene's glands.    Vagina: Normal vaginal mucosa, no evidence of prolapse.    Cervix: Grossly normal in appearance, no bleeding  Uterus: Non-enlarged, mobile, normal contour.  No CMT  Adnexa: ovaries non-enlarged, no adnexal masses  Rectal: deferred  Lymphatic: no evidence of inguinal lymphadenopathy Extremities: no edema, erythema, or tenderness Neurologic: Grossly intact Psychiatric: mood appropriate, affect full  Female chaperone present for pelvic and breast  portions of the physical exam    Assessment: 26 y.o. G0P0000 presenting for routine annual exam  Plan: Problem List Items Addressed This Visit    None    Visit Diagnoses    Thyroid disorder screen    -  Primary   Screening for malignant neoplasm of cervix       Relevant Orders   Pap IG w/ reflex to HPV when ASC-U   Encounter for gynecological examination without abnormal finding       Relevant Orders   Pap IG w/ reflex to HPV when ASC-U   CBC With Differential   Comprehensive metabolic panel   Lipid panel   TSH   Obesity (BMI 30-39.9)       Relevant Orders   Lipid panel   TSH   BMI 33.0-33.9,adult       Lipid screening       Relevant Orders   Lipid panel    Diabetes mellitus screening       Relevant Orders   Comprehensive metabolic panel      1) 4) Gardasil Series discussed and if applicable offered to patient - Patient has not previously completed 3 shot series   2) STI screening was offered and declined  3) ASCCP guidelines and rational discussed.  Patient opts for every 3 years screening interval  4) Contraception - declines contraception, married same sex relationship  5) Routine healthcare maintenance - lipid, thyroid, and DM screening done today  6) Follow up 1 year for routine annual exam

## 2016-09-15 ENCOUNTER — Encounter: Payer: Self-pay | Admitting: Obstetrics and Gynecology

## 2016-09-15 LAB — CBC WITH DIFFERENTIAL
BASOS ABS: 0 10*3/uL (ref 0.0–0.2)
Basos: 0 %
EOS (ABSOLUTE): 0.3 10*3/uL (ref 0.0–0.4)
EOS: 4 %
HEMATOCRIT: 40.4 % (ref 34.0–46.6)
HEMOGLOBIN: 13 g/dL (ref 11.1–15.9)
IMMATURE GRANULOCYTES: 0 %
Immature Grans (Abs): 0 10*3/uL (ref 0.0–0.1)
Lymphocytes Absolute: 3.2 10*3/uL — ABNORMAL HIGH (ref 0.7–3.1)
Lymphs: 36 %
MCH: 27.6 pg (ref 26.6–33.0)
MCHC: 32.2 g/dL (ref 31.5–35.7)
MCV: 86 fL (ref 79–97)
MONOCYTES: 10 %
Monocytes Absolute: 0.9 10*3/uL (ref 0.1–0.9)
Neutrophils Absolute: 4.6 10*3/uL (ref 1.4–7.0)
Neutrophils: 50 %
RBC: 4.71 x10E6/uL (ref 3.77–5.28)
RDW: 13.6 % (ref 12.3–15.4)
WBC: 9.1 10*3/uL (ref 3.4–10.8)

## 2016-09-15 LAB — COMPREHENSIVE METABOLIC PANEL
ALBUMIN: 4.5 g/dL (ref 3.5–5.5)
ALK PHOS: 68 IU/L (ref 39–117)
ALT: 31 IU/L (ref 0–32)
AST: 19 IU/L (ref 0–40)
Albumin/Globulin Ratio: 1.6 (ref 1.2–2.2)
BUN / CREAT RATIO: 14 (ref 9–23)
BUN: 11 mg/dL (ref 6–20)
Bilirubin Total: 0.3 mg/dL (ref 0.0–1.2)
CALCIUM: 9.3 mg/dL (ref 8.7–10.2)
CO2: 20 mmol/L (ref 18–29)
CREATININE: 0.8 mg/dL (ref 0.57–1.00)
Chloride: 100 mmol/L (ref 96–106)
GFR calc Af Amer: 118 mL/min/{1.73_m2} (ref >59)
GFR calc non Af Amer: 102 mL/min/{1.73_m2} (ref >59)
GLOBULIN, TOTAL: 2.8 g/dL (ref 1.5–4.5)
GLUCOSE: 92 mg/dL (ref 65–99)
Potassium: 3.7 mmol/L (ref 3.5–5.2)
Sodium: 137 mmol/L (ref 134–144)
Total Protein: 7.3 g/dL (ref 6.0–8.5)

## 2016-09-15 LAB — LIPID PANEL
CHOL/HDL RATIO: 3.9 ratio (ref 0.0–4.4)
CHOLESTEROL TOTAL: 181 mg/dL (ref 100–199)
HDL: 46 mg/dL (ref >39)
LDL CALC: 112 mg/dL — AB (ref 0–99)
Triglycerides: 114 mg/dL (ref 0–149)
VLDL CHOLESTEROL CAL: 23 mg/dL (ref 5–40)

## 2016-09-15 LAB — TSH: TSH: 1.5 u[IU]/mL (ref 0.450–4.500)

## 2016-09-16 LAB — PAP IG W/ RFLX HPV ASCU: PAP Smear Comment: 0

## 2016-09-18 ENCOUNTER — Telehealth: Payer: Self-pay | Admitting: Obstetrics and Gynecology

## 2016-09-18 ENCOUNTER — Other Ambulatory Visit: Payer: Self-pay | Admitting: Obstetrics and Gynecology

## 2016-09-18 MED ORDER — PHENTERMINE HCL 37.5 MG PO TABS: 37.5000 mg | ORAL_TABLET | Freq: Every day | ORAL | 0 refills | Status: DC

## 2016-09-18 NOTE — Telephone Encounter (Signed)
-----   Message from Malachy Mood, MD sent at 09/18/2016  2:04 PM EDT ----- Regarding: F/U Needs 4 week wt check

## 2016-09-18 NOTE — Telephone Encounter (Signed)
Called and left voicemail for pt to call to be scheduled for an 4 week wt check with AMS.

## 2016-09-18 NOTE — Telephone Encounter (Signed)
Pt scheduled  

## 2016-10-06 ENCOUNTER — Encounter: Payer: Self-pay | Admitting: Obstetrics and Gynecology

## 2016-10-09 ENCOUNTER — Encounter: Payer: Self-pay | Admitting: Obstetrics and Gynecology

## 2016-10-10 ENCOUNTER — Other Ambulatory Visit: Payer: Self-pay | Admitting: Obstetrics and Gynecology

## 2016-10-10 MED ORDER — NITROFURANTOIN MONOHYD MACRO 100 MG PO CAPS: 100.0000 mg | ORAL_CAPSULE | Freq: Two times a day (BID) | ORAL | 1 refills | Status: DC

## 2016-10-11 ENCOUNTER — Emergency Department (HOSPITAL_COMMUNITY): Payer: 59

## 2016-10-11 ENCOUNTER — Emergency Department (HOSPITAL_COMMUNITY)
Admission: EM | Admit: 2016-10-11 | Discharge: 2016-10-11 | Disposition: A | Discharge status: Home / Self Care | Payer: 59 | Attending: Emergency Medicine | Admitting: Emergency Medicine

## 2016-10-11 ENCOUNTER — Encounter (HOSPITAL_COMMUNITY): Payer: Self-pay

## 2016-10-11 DIAGNOSIS — R109 Unspecified abdominal pain: Secondary | ICD-10-CM

## 2016-10-11 DIAGNOSIS — J45909 Unspecified asthma, uncomplicated: Secondary | ICD-10-CM | POA: Insufficient documentation

## 2016-10-11 DIAGNOSIS — Z79899 Other long term (current) drug therapy: Secondary | ICD-10-CM | POA: Diagnosis not present

## 2016-10-11 DIAGNOSIS — N200 Calculus of kidney: Secondary | ICD-10-CM | POA: Insufficient documentation

## 2016-10-11 LAB — BASIC METABOLIC PANEL
Anion gap: 9 (ref 5–15)
BUN: 11 mg/dL (ref 6–20)
CALCIUM: 9.5 mg/dL (ref 8.9–10.3)
CO2: 23 mmol/L (ref 22–32)
CREATININE: 0.94 mg/dL (ref 0.44–1.00)
Chloride: 103 mmol/L (ref 101–111)
GFR calc non Af Amer: >60 mL/min (ref >60)
GLUCOSE: 97 mg/dL (ref 65–99)
Potassium: 3.5 mmol/L (ref 3.5–5.1)
Sodium: 135 mmol/L (ref 135–145)

## 2016-10-11 LAB — CBC WITH DIFFERENTIAL/PLATELET
BASOS PCT: 0 %
Basophils Absolute: 0.1 10*3/uL (ref 0.0–0.1)
Eosinophils Absolute: 0.1 10*3/uL (ref 0.0–0.7)
Eosinophils Relative: 1 %
HEMATOCRIT: 38.7 % (ref 36.0–46.0)
Hemoglobin: 13.7 g/dL (ref 12.0–15.0)
Lymphocytes Relative: 21 %
Lymphs Abs: 2.4 10*3/uL (ref 0.7–4.0)
MCH: 29.5 pg (ref 26.0–34.0)
MCHC: 35.4 g/dL (ref 30.0–36.0)
MCV: 83.4 fL (ref 78.0–100.0)
MONO ABS: 0.7 10*3/uL (ref 0.1–1.0)
Monocytes Relative: 6 %
NEUTROS ABS: 8.2 10*3/uL — AB (ref 1.7–7.7)
Neutrophils Relative %: 72 %
Platelets: 281 10*3/uL (ref 150–400)
RBC: 4.64 MIL/uL (ref 3.87–5.11)
RDW: 13.8 % (ref 11.5–15.5)
WBC: 11.5 10*3/uL — ABNORMAL HIGH (ref 4.0–10.5)

## 2016-10-11 LAB — URINALYSIS, ROUTINE W REFLEX MICROSCOPIC
BILIRUBIN URINE: NEGATIVE
GLUCOSE, UA: NEGATIVE mg/dL
KETONES UR: 80 mg/dL — AB
LEUKOCYTES UA: NEGATIVE
NITRITE: NEGATIVE
PROTEIN: 100 mg/dL — AB
Specific Gravity, Urine: 1.023 (ref 1.005–1.030)
pH: 5 (ref 5.0–8.0)

## 2016-10-11 LAB — PREGNANCY, URINE: Preg Test, Ur: NEGATIVE

## 2016-10-11 MED ORDER — ONDANSETRON HCL 4 MG/2ML IJ SOLN
4.0000 mg | Freq: Once | INTRAMUSCULAR | Status: AC
  Administered 2016-10-11: 4 mg via INTRAVENOUS
  Filled 2016-10-11: qty 2

## 2016-10-11 MED ORDER — MORPHINE SULFATE (PF) 4 MG/ML IV SOLN
4.0000 mg | Freq: Once | INTRAVENOUS | Status: AC
Start: 2016-10-11 — End: 2016-10-11
  Administered 2016-10-11: 4 mg via INTRAVENOUS
  Filled 2016-10-11: qty 1

## 2016-10-11 MED ORDER — KETOROLAC TROMETHAMINE 30 MG/ML IJ SOLN
30.0000 mg | Freq: Once | INTRAMUSCULAR | Status: AC
  Administered 2016-10-11: 30 mg via INTRAVENOUS
  Filled 2016-10-11: qty 1

## 2016-10-11 MED ORDER — SODIUM CHLORIDE 0.9 % IV BOLUS (SEPSIS)
500.0000 mL | Freq: Once | INTRAVENOUS | Status: AC
  Administered 2016-10-11: 500 mL via INTRAVENOUS

## 2016-10-11 MED ORDER — OXYCODONE-ACETAMINOPHEN 5-325 MG PO TABS: 1.0000 | ORAL_TABLET | ORAL | 0 refills | Status: DC | PRN

## 2016-10-11 MED ORDER — TAMSULOSIN HCL 0.4 MG PO CAPS: 0.4000 mg | ORAL_CAPSULE | Freq: Every day | ORAL | 0 refills | Status: DC

## 2016-10-11 MED ORDER — ONDANSETRON HCL 4 MG PO TABS: 4.0000 mg | ORAL_TABLET | Freq: Four times a day (QID) | ORAL | 0 refills | Status: DC

## 2016-10-11 NOTE — ED Triage Notes (Signed)
Pt reports UTI symptoms since Friday that were intermittent. Pt reports pressure and urgency. Started nitrofurantoin since Monday but conts to have pressure and urgency. Pain right flank started today and described as sharp. Denies chills or fever

## 2016-10-11 NOTE — Discharge Instructions (Signed)
You have a 4 mm kidney stone that is very near the bladder. Prescription for pain medicine, nausea medicine, medication to increase your urinary flow. Follow-up with urology if not getting better. Phone number given.

## 2016-10-12 NOTE — ED Provider Notes (Signed)
[facility] DEPT Provider Note   CSN: [id number] Arrival date & time: 10/11/16  1747     History   Chief Complaint Chief Complaint  Patient presents with  . Flank Pain    HPI Nicole Rodriguez is a 26 y.o. female.  Right flank pain since Sunday getting must worse today at approximately 1:30 PM. Decreased urinary output. History of kidney stones in the past. She started nitrofurantoin on Monday. No fever, chills, dysuria. Severity of pain is moderate.      Past Medical History:  Diagnosis Date  . Asthma     There are no active problems to display for this patient.   Past Surgical History:  Procedure Laterality Date  . APPENDECTOMY    . BREAST LUMPECTOMY    . FOOT SURGERY      OB History    Gravida Para Term Preterm AB Living   0 0 0 0 0 0   SAB TAB Ectopic Multiple Live Births   0 0 0 0 0       Home Medications    Prior to Admission medications   Medication Sig Start Date End Date Taking? Authorizing Provider  acetaminophen (TYLENOL) 500 MG tablet Take 500 mg by mouth every 6 (six) hours as needed for mild pain or moderate pain.    Yes Historical Provider, MD  albuterol (PROVENTIL) (2.5 MG/3ML) 0.083% nebulizer solution Take 2.5 mg by nebulization every 6 (six) hours as needed for wheezing or shortness of breath.   Yes Historical Provider, MD  Albuterol Sulfate (PROAIR RESPICLICK) 295 (90 Base) MCG/ACT AEPB Inhale 1-2 puffs into the lungs every 6 (six) hours as needed (shortness of breath).   Yes Historical Provider, MD  fluticasone (FLONASE) 50 MCG/ACT nasal spray Place 2 sprays into both nostrils daily as needed for allergies or rhinitis.   Yes Historical Provider, MD  Fluticasone Furoate-Vilanterol (BREO ELLIPTA) 100-25 MCG/INH AEPB Inhale 1 puff into the lungs daily.   Yes Historical Provider, MD  levocetirizine (XYZAL) 5 MG tablet Take 5 mg by mouth every evening.   Yes Historical Provider, MD  montelukast (SINGULAIR) 10 MG tablet Take 10 mg by mouth at  bedtime.   Yes Historical Provider, MD  nitrofurantoin, macrocrystal-monohydrate, (MACROBID) 100 MG capsule Take 1 capsule (100 mg total) by mouth 2 (two) times daily. 10/10/16  Yes Malachy Mood, MD  phentermine (ADIPEX-P) 37.5 MG tablet Take 1 tablet (37.5 mg total) by mouth daily before breakfast. 09/18/16  Yes Malachy Mood, MD  ondansetron (ZOFRAN) 4 MG tablet Take 1 tablet (4 mg total) by mouth every 6 (six) hours. 10/11/16   Nat Christen, MD  oxyCODONE-acetaminophen (PERCOCET) 5-325 MG tablet Take 1-2 tablets by mouth every 4 (four) hours as needed. 10/11/16   Nat Christen, MD  tamsulosin (FLOMAX) 0.4 MG CAPS capsule Take 1 capsule (0.4 mg total) by mouth daily. 10/11/16   Nat Christen, MD    Family History Family History  Problem Relation Age of Onset  . Asthma Father   . Diabetes Other   . Heart failure Other     Social History Social History  Substance Use Topics  . Smoking status: Never Smoker  . Smokeless tobacco: Never Used  . Alcohol use No     Allergies   Cefoxitin; Amoxicillin; Asa [aspirin]; and Sulfa antibiotics   Review of Systems Review of Systems  All other systems reviewed and are negative.    Physical Exam Updated Vital Signs BP 132/77 (BP Location: Right Arm)   Pulse 96  Temp 97.6 F (36.4 C) (Oral)   Resp 18   Ht 5\' 3"  (1.6 m)   Wt 183 lb (83 kg)   LMP 09/27/2016   SpO2 100%   BMI 32.42 kg/m   Physical Exam  Constitutional: She is oriented to person, place, and time. She appears well-developed and well-nourished.  HENT:  Head: Normocephalic and atraumatic.  Eyes: Conjunctivae are normal.  Neck: Neck supple.  Cardiovascular: Normal rate and regular rhythm.   Pulmonary/Chest: Effort normal and breath sounds normal.  Abdominal: Soft. Bowel sounds are normal.  Genitourinary:  Genitourinary Comments: Tender right flank  Musculoskeletal: Normal range of motion.  Neurological: She is alert and oriented to person, place, and time.  Skin: Skin  is warm and dry.  Psychiatric: She has a normal mood and affect. Her behavior is normal.  Nursing note and vitals reviewed.    ED Treatments / Results  Labs (all labs ordered are listed, but only abnormal results are displayed) Labs Reviewed  URINALYSIS, ROUTINE W REFLEX MICROSCOPIC - Abnormal; Notable for the following:       Result Value   Color, Urine AMBER (*)    APPearance HAZY (*)    Hgb urine dipstick MODERATE (*)    Ketones, ur 80 (*)    Protein, ur 100 (*)    Bacteria, UA RARE (*)    Squamous Epithelial / LPF 0-5 (*)    All other components within normal limits  CBC WITH DIFFERENTIAL/PLATELET - Abnormal; Notable for the following:    WBC 11.5 (*)    Neutro Abs 8.2 (*)    All other components within normal limits  PREGNANCY, URINE  BASIC METABOLIC PANEL    EKG  EKG Interpretation None       Radiology Ct Renal Stone Study  Result Date: 10/11/2016 CLINICAL DATA:  RIGHT flank pain which started today. EXAM: CT ABDOMEN AND PELVIS WITHOUT CONTRAST TECHNIQUE: Multidetector CT imaging of the abdomen and pelvis was performed following the standard protocol without IV contrast. COMPARISON:  None. FINDINGS: Lower chest: Lung bases are clear. Hepatobiliary: Small cysts in the inferior RIGHT hepatic lobe. No duct dilatation. Normal gallbladder. Pancreas: Pancreas is normal. No ductal dilatation. No pancreatic inflammation. Spleen: Normal spleen Adrenals/urinary tract: Adrenal glands are normal. Mild-to-moderate Hydronephrosis and hydroureter of the RIGHT collecting system. This obstructive uropathy is secondary to a obstructing calculus in the distal RIGHT ureter at the vesicoureteral junction. This calculus measures 4 mm (image 80, series 2). Two additional 1-2 mm calculi within the RIGHT kidney. No LEFT nephrolithiasis or ureterolithiasis. No bladder calculi other than the RIGHT vesicoureteral junction calculus. Stomach/Bowel: Stomach and small-bowel normal. Terminal ileum is  normal. Appendix is not identified. There is small amount fluid adjacent to the cecum. This is favored to emanate from the RIGHT adnexa rather than the bowel or appendiceal inflammation. The colon and rectosigmoid colon are normal. Vascular/Lymphatic: Abdominal aorta is normal caliber. There is no retroperitoneal or periportal lymphadenopathy. No pelvic lymphadenopathy. Reproductive: Uterus is normal. Ovaries normal. Smaller free fluid on the RIGHT. Other: Free fluid on the RIGHT. Musculoskeletal: No aggressive osseous lesion. IMPRESSION: 1. Obstructing calculus within the distal RIGHT ureter at the vesicoureteral junction. 2. Small RIGHT nephrolithiasis. 3. Small volume of fluid within the RIGHT pelvis adjacent to cecum is favored physiologic related to ovulation. Electronically Signed   By: Suzy Bouchard M.D.   On: 10/11/2016 21:03    Procedures Procedures (including critical care time)  Medications Ordered in ED Medications  sodium chloride  0.9 % bolus 500 mL (0 mLs Intravenous Stopped 10/11/16 2103)  ketorolac (TORADOL) 30 MG/ML injection 30 mg (30 mg Intravenous Given 10/11/16 2027)  morphine 4 MG/ML injection 4 mg (4 mg Intravenous Given 10/11/16 2027)  ondansetron (ZOFRAN) injection 4 mg (4 mg Intravenous Given 10/11/16 2026)     Initial Impression / Assessment and Plan / ED Course  I have reviewed the triage vital signs and the nursing notes.  Pertinent labs & imaging results that were available during my care of the patient were reviewed by me and considered in my medical decision making (see chart for details).    CT reveals a right 4 mm UVJ calculus. Pain management. Discharge medications Percocet, Zofran 4 mg, Flomax 0.4 mg. Follow-up with urology.   Final Clinical Impressions(s) / ED Diagnoses   Final diagnoses:  Right flank pain  Right kidney stone    New Prescriptions Discharge Medication List as of 10/11/2016 10:17 PM    START taking these medications   Details    ondansetron (ZOFRAN) 4 MG tablet Take 1 tablet (4 mg total) by mouth every 6 (six) hours., Starting Wed 10/11/2016, Print    oxyCODONE-acetaminophen (PERCOCET) 5-325 MG tablet Take 1-2 tablets by mouth every 4 (four) hours as needed., Starting Wed 10/11/2016, Print    tamsulosin (FLOMAX) 0.4 MG CAPS capsule Take 1 capsule (0.4 mg total) by mouth daily., Starting Wed 10/11/2016, Print         Nat Christen, MD 10/12/16 [phone]

## 2016-10-17 ENCOUNTER — Ambulatory Visit: Payer: 59 | Admitting: Obstetrics and Gynecology

## 2016-10-18 DIAGNOSIS — N202 Calculus of kidney with calculus of ureter: Secondary | ICD-10-CM | POA: Diagnosis not present

## 2016-10-23 ENCOUNTER — Ambulatory Visit: Payer: 59 | Admitting: Obstetrics and Gynecology

## 2016-10-23 ENCOUNTER — Ambulatory Visit (INDEPENDENT_AMBULATORY_CARE_PROVIDER_SITE_OTHER): Payer: 59 | Admitting: Obstetrics and Gynecology

## 2016-10-23 ENCOUNTER — Encounter: Payer: Self-pay | Admitting: Obstetrics and Gynecology

## 2016-10-23 VITALS — BP 110/74 | HR 95 | Wt 182.0 lb

## 2016-10-23 DIAGNOSIS — Z6832 Body mass index (BMI) 32.0-32.9, adult: Secondary | ICD-10-CM

## 2016-10-23 DIAGNOSIS — E669 Obesity, unspecified: Secondary | ICD-10-CM

## 2016-10-23 MED ORDER — PHENTERMINE HCL 37.5 MG PO TABS: 37.5000 mg | ORAL_TABLET | Freq: Every day | ORAL | 0 refills | Status: DC

## 2016-10-23 NOTE — Progress Notes (Signed)
Gynecology Office Visit  Chief Complaint:  Chief Complaint  Patient presents with  . Weight Check    History of Present Illness: Patientis a 26 y.o. [place] female, who presents for the evaluation of the desire to lose weight. She has lost 6 pounds. The patient states the following symptoms since starting her weight loss therapy: appetite suppression, energy, and weight loss.  The patient also reports no other ill effects. The patient specifically denies heart palpitations, anxiety, and insomnia.    Review of Systems: 10 point review of systems negative unless otherwise noted in HPI  Past Medical History:  Past Medical History:  Diagnosis Date  . Asthma     Past Surgical History:  Past Surgical History:  Procedure Laterality Date  . APPENDECTOMY    . BREAST LUMPECTOMY    . FOOT SURGERY      Gynecologic History: Patient's last menstrual period was 10/19/2016.  Obstetric History: G0P0000  Family History:  Family History  Problem Relation Age of Onset  . Asthma Father   . Diabetes Other   . Heart failure Other     Social History:  Social History   Social History  . Marital status: Married    Spouse name: N/A  . Number of children: N/A  . Years of education: N/A   Occupational History  . Not on file.   Social History Main Topics  . Smoking status: Never Smoker  . Smokeless tobacco: Never Used  . Alcohol use No  . Drug use: No  . Sexual activity: Yes    Birth control/ protection: None   Other Topics Concern  . Not on file   Social History Narrative  . No narrative on file    Allergies:  Allergies  Allergen Reactions  . Cefoxitin Itching and Rash  . Amoxicillin Hives  . Asa [Aspirin] Hives  . Sulfa Antibiotics Hives    Medications: Prior to Admission medications   Medication Sig Start Date End Date Taking? Authorizing Provider  acetaminophen (TYLENOL) 500 MG tablet Take 500 mg by mouth every 6 (six) hours as needed for mild pain or  moderate pain.     Historical Provider, MD  albuterol (PROVENTIL) (2.5 MG/3ML) 0.083% nebulizer solution Take 2.5 mg by nebulization every 6 (six) hours as needed for wheezing or shortness of breath.    Historical Provider, MD  Albuterol Sulfate (PROAIR RESPICLICK) 681 (90 Base) MCG/ACT AEPB Inhale 1-2 puffs into the lungs every 6 (six) hours as needed (shortness of breath).    Historical Provider, MD  fluticasone (FLONASE) 50 MCG/ACT nasal spray Place 2 sprays into both nostrils daily as needed for allergies or rhinitis.    Historical Provider, MD  Fluticasone Furoate-Vilanterol (BREO ELLIPTA) 100-25 MCG/INH AEPB Inhale 1 puff into the lungs daily.    Historical Provider, MD  levocetirizine (XYZAL) 5 MG tablet Take 5 mg by mouth every evening.    Historical Provider, MD  montelukast (SINGULAIR) 10 MG tablet Take 10 mg by mouth at bedtime.    Historical Provider, MD  nitrofurantoin, macrocrystal-monohydrate, (MACROBID) 100 MG capsule Take 1 capsule (100 mg total) by mouth 2 (two) times daily. 10/10/16   Malachy Mood, MD  ondansetron (ZOFRAN) 4 MG tablet Take 1 tablet (4 mg total) by mouth every 6 (six) hours. 10/11/16   Nat Christen, MD  oxyCODONE-acetaminophen (PERCOCET) 5-325 MG tablet Take 1-2 tablets by mouth every 4 (four) hours as needed. 10/11/16   Nat Christen, MD  phentermine (ADIPEX-P) 37.5 MG tablet  Take 1 tablet (37.5 mg total) by mouth daily before breakfast. 09/18/16   Malachy Mood, MD  tamsulosin (FLOMAX) 0.4 MG CAPS capsule Take 1 capsule (0.4 mg total) by mouth daily. 10/11/16   Nat Christen, MD    Physical Exam Vitals:  Vitals:   10/23/16 1411  BP: 110/74  Pulse: 95   Patient's last menstrual period was 10/19/2016. Filed Weights   10/23/16 1411  Weight: 182 lb (82.6 kg)   Body mass index is 32.24 kg/m.  General: NAD HEENT: normocephalic, anicteric Thyroid: no enlargement Pulmonary: no increased work of breathing Neurologic: Grossly intact Psychiatric: mood  appropriate, affect full  Assessment: 26 y.o. G0P0000 weight loss follow up  Plan: Problem List Items Addressed This Visit    None    Visit Diagnoses    BMI 32.0-32.9,adult    -  Primary   Class 1 obesity without serious comorbidity with body mass index (BMI) of 32.0 to 32.9 in adult, unspecified obesity type       Relevant Medications   phentermine (ADIPEX-P) 37.5 MG tablet      1) 1500 Calorie ADA Diet  2) Patient education given regarding appropriate lifestyle changes for weight loss including: regular physical activity, healthy coping strategies, caloric restriction and healthy eating patterns.  3) Patient will be started on weight loss medication. The risks and benefits and side effects of medication, such as Adipex (Phenteramine) ,  Tenuate (Diethylproprion), Belviq (lorcarsin), Contrave (buproprion/naltrexone), Qsymia (phentermine/topiramate), and Saxenda (liraglutide) is discussed. The pros and cons of suppressing appetite and boosting metabolism is discussed. Risks of tolerence and addiction is discussed for selected agents discussed. Use of medicine will ne short term, such as 3-4 months at a time followed by a period of time off of the medicine to avoid these risks and side effects for Adipex, Qsymia, and Tenuate discussed. Pt to call with any negative side effects and agrees to keep follow up appts.  4) Patient to take medication, with the benefits of appetite suppression and metabolism boost d/w pt, along with the side effects and risk factors of long term use that will be avoided with our use of short bursts of therapy. Rx provided.    5) 15 minutes face-to-face; with counseling/coordination of care > 50 percent of visit related to obesity and ongoing management/treatment   6) Follow up in 4 weeks to assess response

## 2016-10-23 NOTE — Patient Instructions (Signed)

## 2016-11-10 DIAGNOSIS — R0782 Intercostal pain: Secondary | ICD-10-CM | POA: Diagnosis not present

## 2016-11-28 ENCOUNTER — Ambulatory Visit: Payer: 59 | Admitting: Obstetrics and Gynecology

## 2017-01-01 ENCOUNTER — Ambulatory Visit: Payer: 59 | Admitting: Obstetrics and Gynecology

## 2017-01-18 ENCOUNTER — Ambulatory Visit (INDEPENDENT_AMBULATORY_CARE_PROVIDER_SITE_OTHER): Payer: 59 | Admitting: Obstetrics and Gynecology

## 2017-01-18 ENCOUNTER — Encounter: Payer: Self-pay | Admitting: Obstetrics and Gynecology

## 2017-01-18 VITALS — BP 110/74 | HR 101 | Ht 63.0 in | Wt 170.0 lb

## 2017-01-18 DIAGNOSIS — E669 Obesity, unspecified: Secondary | ICD-10-CM

## 2017-01-18 DIAGNOSIS — Z6830 Body mass index (BMI) 30.0-30.9, adult: Secondary | ICD-10-CM | POA: Diagnosis not present

## 2017-01-18 MED ORDER — PHENTERMINE HCL 37.5 MG PO TABS: 37.5000 mg | ORAL_TABLET | Freq: Every day | ORAL | 0 refills | Status: DC

## 2017-01-18 NOTE — Progress Notes (Signed)
Gynecology Office Visit  Chief Complaint:  Chief Complaint  Patient presents with  . Weight Check    History of Present Illness: Patientis a 26 y.o. [place] female, who presents for the evaluation of the desire to lose weight. She has lost 12 pounds, for total weight loss of 18lbs since April. The patient states the following symptoms since starting her weight loss therapy: appetite suppression, energy, and weight loss.  The patient also reports no other ill effects. The patient specifically denies heart palpitations, anxiety, and insomnia.    Review of Systems: 10 point review of systems negative unless otherwise noted in HPI  Past Medical History:  Past Medical History:  Diagnosis Date  . Asthma     Past Surgical History:  Past Surgical History:  Procedure Laterality Date  . APPENDECTOMY    . BREAST LUMPECTOMY    . FOOT SURGERY      Gynecologic History: Patient's last menstrual period was 01/11/2017.  Obstetric History: G0P0000  Family History:  Family History  Problem Relation Age of Onset  . Asthma Father   . Diabetes Other   . Heart failure Other     Social History:  Social History   Social History  . Marital status: Married    Spouse name: N/A  . Number of children: N/A  . Years of education: N/A   Occupational History  . Not on file.   Social History Main Topics  . Smoking status: Never Smoker  . Smokeless tobacco: Never Used  . Alcohol use No  . Drug use: No  . Sexual activity: Yes    Birth control/ protection: None   Other Topics Concern  . Not on file   Social History Narrative  . No narrative on file    Allergies:  Allergies  Allergen Reactions  . Cefoxitin Itching and Rash  . Amoxicillin Hives  . Asa [Aspirin] Hives  . Sulfa Antibiotics Hives    Medications: Prior to Admission medications   Medication Sig Start Date End Date Taking? Authorizing Provider  acetaminophen (TYLENOL) 500 MG tablet Take 500 mg by mouth every 6  (six) hours as needed for mild pain or moderate pain.    Yes [provider]  albuterol (PROVENTIL) (2.5 MG/3ML) 0.083% nebulizer solution Take 2.5 mg by nebulization every 6 (six) hours as needed for wheezing or shortness of breath.   Yes [provider]  Albuterol Sulfate (PROAIR RESPICLICK) 782 (90 Base) MCG/ACT AEPB Inhale 1-2 puffs into the lungs every 6 (six) hours as needed (shortness of breath).   Yes [provider]  fluticasone (FLONASE) 50 MCG/ACT nasal spray Place 2 sprays into both nostrils daily as needed for allergies or rhinitis.   Yes [provider]  Fluticasone Furoate-Vilanterol (BREO ELLIPTA) 100-25 MCG/INH AEPB Inhale 1 puff into the lungs daily.   Yes [provider]  levocetirizine (XYZAL) 5 MG tablet Take 5 mg by mouth every evening.   Yes [provider]  montelukast (SINGULAIR) 10 MG tablet Take 10 mg by mouth at bedtime.   Yes [provider]  phentermine (ADIPEX-P) 37.5 MG tablet Take 1 tablet (37.5 mg total) by mouth daily before breakfast. 10/23/16  Yes Malachy Mood, MD    Physical Exam Blood pressure 110/74, pulse (!) 101, height 5\' 3"  (1.6 m), weight 170 lb (77.1 kg), last menstrual period 01/11/2017. Body mass index is 30.11 kg/m.  General: NAD HEENT: normocephalic, anicteric Thyroid: no enlargement Pulmonary: no increased work of breathing Neurologic:  Grossly intact Psychiatric: mood appropriate, affect full  Assessment: 26 y.o. G0P0000 medical weight loss follow up  Plan: Problem List Items Addressed This Visit    None    Visit Diagnoses    Class 1 obesity without serious comorbidity with body mass index (BMI) of 30.0 to 30.9 in adult, unspecified obesity type    -  Primary   Relevant Medications   phentermine (ADIPEX-P) 37.5 MG tablet      1) 1500 Calorie ADA Diet  2) Patient education given regarding appropriate lifestyle changes for weight loss including: regular physical  activity, healthy coping strategies, caloric restriction and healthy eating patterns.  3) Patient will be started on weight loss medication. The risks and benefits and side effects of medication, such as Adipex (Phenteramine) ,  Tenuate (Diethylproprion), Belviq (lorcarsin), Contrave (buproprion/naltrexone), Qsymia (phentermine/topiramate), and Saxenda (liraglutide) is discussed. The pros and cons of suppressing appetite and boosting metabolism is discussed. Risks of tolerence and addiction is discussed for selected agents discussed. Use of medicine will ne short term, such as 3-4 months at a time followed by a period of time off of the medicine to avoid these risks and side effects for Adipex, Qsymia, and Tenuate discussed. Pt to call with any negative side effects and agrees to keep follow up appts.  4) Patient to take medication, with the benefits of appetite suppression and metabolism boost d/w pt, along with the side effects and risk factors of long term use that will be avoided with our use of short bursts of therapy. Rx provided.    5) 15 minutes face-to-face; with counseling/coordination of care > 50 percent of visit related to obesity and ongoing management/treatment   6) Follow up in 4 weeks to assess response

## 2017-01-19 ENCOUNTER — Emergency Department (HOSPITAL_COMMUNITY)
Admission: EM | Admit: 2017-01-19 | Discharge: 2017-01-19 | Disposition: A | Discharge status: Home Health | Payer: Worker's Compensation | Attending: Emergency Medicine | Admitting: Emergency Medicine

## 2017-01-19 ENCOUNTER — Emergency Department (HOSPITAL_COMMUNITY): Payer: Worker's Compensation

## 2017-01-19 ENCOUNTER — Encounter (HOSPITAL_COMMUNITY): Payer: Self-pay

## 2017-01-19 DIAGNOSIS — Y990 Civilian activity done for income or pay: Secondary | ICD-10-CM | POA: Insufficient documentation

## 2017-01-19 DIAGNOSIS — J45909 Unspecified asthma, uncomplicated: Secondary | ICD-10-CM | POA: Diagnosis not present

## 2017-01-19 DIAGNOSIS — X500XXA Overexertion from strenuous movement or load, initial encounter: Secondary | ICD-10-CM | POA: Insufficient documentation

## 2017-01-19 DIAGNOSIS — Z79899 Other long term (current) drug therapy: Secondary | ICD-10-CM | POA: Insufficient documentation

## 2017-01-19 DIAGNOSIS — Y9289 Other specified places as the place of occurrence of the external cause: Secondary | ICD-10-CM | POA: Insufficient documentation

## 2017-01-19 DIAGNOSIS — S46912A Strain of unspecified muscle, fascia and tendon at shoulder and upper arm level, left arm, initial encounter: Secondary | ICD-10-CM | POA: Diagnosis not present

## 2017-01-19 DIAGNOSIS — Y9389 Activity, other specified: Secondary | ICD-10-CM | POA: Insufficient documentation

## 2017-01-19 DIAGNOSIS — S4992XA Unspecified injury of left shoulder and upper arm, initial encounter: Secondary | ICD-10-CM | POA: Diagnosis present

## 2017-01-19 MED ORDER — METHOCARBAMOL 500 MG PO TABS: 500.0000 mg | ORAL_TABLET | Freq: Two times a day (BID) | ORAL | 0 refills | Status: DC

## 2017-01-19 MED ORDER — IBUPROFEN 800 MG PO TABS: 800.0000 mg | ORAL_TABLET | Freq: Three times a day (TID) | ORAL | 0 refills | Status: DC

## 2017-01-19 NOTE — Discharge Instructions (Signed)
Return if any problems.

## 2017-01-19 NOTE — ED Triage Notes (Signed)
Pt works at [employer] and was picking up a box approx 16 lbs  And heard a pop. Pain with all movement and worse with lifting arm up . Injury occurrred approx 12 pm today

## 2017-01-19 NOTE — ED Provider Notes (Signed)
[facility] DEPT Provider Note   CSN: [id number] Arrival date & time: 01/19/17  1641     History   Chief Complaint Chief Complaint  Patient presents with  . Shoulder Pain    HPI Nicole Rodriguez is a 26 y.o. female.  The history is provided by the patient. No language interpreter was used.  Shoulder Pain   This is a new problem. The current episode started more than 1 week ago. The problem occurs constantly. The problem has been gradually worsening. The pain is present in the left shoulder. The quality of the pain is described as aching. The pain is moderate.   Pt reports she was lifting and heard a loud pop.  Pt reports she has been having shoulder pain since.  Past Medical History:  Diagnosis Date  . Asthma     There are no active problems to display for this patient.   Past Surgical History:  Procedure Laterality Date  . APPENDECTOMY    . BREAST LUMPECTOMY    . FOOT SURGERY      OB History    Gravida Para Term Preterm AB Living   0 0 0 0 0 0   SAB TAB Ectopic Multiple Live Births   0 0 0 0 0       Home Medications    Prior to Admission medications   Medication Sig Start Date End Date Taking? Authorizing Provider  acetaminophen (TYLENOL) 500 MG tablet Take 500 mg by mouth every 6 (six) hours as needed for mild pain or moderate pain.     [provider]  albuterol (PROVENTIL) (2.5 MG/3ML) 0.083% nebulizer solution Take 2.5 mg by nebulization every 6 (six) hours as needed for wheezing or shortness of breath.    [provider]  Albuterol Sulfate (PROAIR RESPICLICK) 944 (90 Base) MCG/ACT AEPB Inhale 1-2 puffs into the lungs every 6 (six) hours as needed (shortness of breath).    [provider]  fluticasone (FLONASE) 50 MCG/ACT nasal spray Place 2 sprays into both nostrils daily as needed for allergies or rhinitis.    [provider]  Fluticasone Furoate-Vilanterol (BREO ELLIPTA) 100-25 MCG/INH AEPB Inhale 1 puff into the  lungs daily.    [provider]  ibuprofen (ADVIL,MOTRIN) 800 MG tablet Take 1 tablet (800 mg total) by mouth 3 (three) times daily. 01/19/17   Fransico Meadow, PA-C  levocetirizine (XYZAL) 5 MG tablet Take 5 mg by mouth every evening.    [provider]  methocarbamol (ROBAXIN) 500 MG tablet Take 1 tablet (500 mg total) by mouth 2 (two) times daily. 01/19/17   Fransico Meadow, PA-C  montelukast (SINGULAIR) 10 MG tablet Take 10 mg by mouth at bedtime.    [provider]  phentermine (ADIPEX-P) 37.5 MG tablet Take 1 tablet (37.5 mg total) by mouth daily before breakfast. 01/18/17   Malachy Mood, MD    Family History Family History  Problem Relation Age of Onset  . Asthma Father   . Diabetes Other   . Heart failure Other     Social History Social History  Substance Use Topics  . Smoking status: Never Smoker  . Smokeless tobacco: Never Used  . Alcohol use No     Allergies   Cefoxitin; Amoxicillin; Asa [aspirin]; and Sulfa antibiotics   Review of Systems Review of Systems  All other systems reviewed and are negative.    Physical Exam Updated Vital Signs BP 133/85 (BP Location: Right Arm)   Pulse (!) 109  Temp (!) 97.5 F (36.4 C) (Oral)   Resp 18   Wt 77.1 kg (170 lb)   LMP 01/11/2017   SpO2 100%   BMI 30.11 kg/m   Physical Exam  Constitutional: She is oriented to person, place, and time. She appears well-developed and well-nourished.  HENT:  Head: Normocephalic.  Cardiovascular: Normal rate.   Pulmonary/Chest: Effort normal.  Musculoskeletal: She exhibits tenderness.  Tender left scapula,  Pain with range of motion,  nv and ns intact,    Neurological: She is alert and oriented to person, place, and time.  Skin: Skin is warm.  Psychiatric: She has a normal mood and affect.  Nursing note and vitals reviewed.    ED Treatments / Results  Labs (all labs ordered are listed, but only abnormal results are displayed) Labs Reviewed -  No data to display  EKG  EKG Interpretation None       Radiology Dg Shoulder Left  Result Date: 01/19/2017 CLINICAL DATA:  Pain after picking up a 16 pound box at work. Patient heard a pop. EXAM: LEFT SHOULDER - 2+ VIEW COMPARISON:  None. FINDINGS: There is no evidence of fracture or dislocation. There is no evidence of arthropathy or other focal bone abnormality. Soft tissues are unremarkable. IMPRESSION: No acute osseous abnormality. [facility] and glenohumeral joints are intact. Electronically Signed   By: Ashley Royalty M.D.   On: 01/19/2017 17:47    Procedures Procedures (including critical care time)  Medications Ordered in ED Medications - No data to display   Initial Impression / Assessment and Plan / ED Course  I have reviewed the triage vital signs and the nursing notes.  Pertinent labs & imaging results that were available during my care of the patient were reviewed by me and considered in my medical decision making (see chart for details).     I counseled on Muscle strain,  I advised ice, rest,   Final Clinical Impressions(s) / ED Diagnoses   Final diagnoses:  Shoulder strain, left, initial encounter    New Prescriptions Discharge Medication List as of 01/19/2017  6:46 PM    START taking these medications   Details  ibuprofen (ADVIL,MOTRIN) 800 MG tablet Take 1 tablet (800 mg total) by mouth 3 (three) times daily., Starting Fri 01/19/2017, Print    methocarbamol (ROBAXIN) 500 MG tablet Take 1 tablet (500 mg total) by mouth 2 (two) times daily., Starting Fri 01/19/2017, Print      An After Visit Summary was printed and given to the patient.    Sidney Ace 01/19/17 2140    Davonna Belling, MD 01/20/17 [id number]

## 2017-01-29 DIAGNOSIS — Z0000 Encounter for general adult medical examination without abnormal findings: Secondary | ICD-10-CM | POA: Diagnosis not present

## 2017-01-30 ENCOUNTER — Encounter: Payer: Self-pay | Admitting: Obstetrics and Gynecology

## 2017-01-30 ENCOUNTER — Other Ambulatory Visit: Payer: Self-pay | Admitting: Obstetrics and Gynecology

## 2017-01-30 DIAGNOSIS — M50322 Other cervical disc degeneration at C5-C6 level: Secondary | ICD-10-CM | POA: Diagnosis not present

## 2017-01-30 DIAGNOSIS — Z139 Encounter for screening, unspecified: Secondary | ICD-10-CM

## 2017-02-01 ENCOUNTER — Other Ambulatory Visit: Payer: 59

## 2017-02-01 ENCOUNTER — Other Ambulatory Visit: Payer: Self-pay | Admitting: Obstetrics and Gynecology

## 2017-02-01 DIAGNOSIS — Z0000 Encounter for general adult medical examination without abnormal findings: Secondary | ICD-10-CM | POA: Diagnosis not present

## 2017-02-01 DIAGNOSIS — Z139 Encounter for screening, unspecified: Secondary | ICD-10-CM | POA: Diagnosis not present

## 2017-02-02 LAB — BASIC METABOLIC PANEL
BUN/Creatinine Ratio: 15 (ref 9–23)
BUN: 10 mg/dL (ref 6–20)
CALCIUM: 9 mg/dL (ref 8.7–10.2)
CO2: 21 mmol/L (ref 20–29)
CREATININE: 0.68 mg/dL (ref 0.57–1.00)
Chloride: 103 mmol/L (ref 96–106)
GFR calc Af Amer: 140 mL/min/{1.73_m2} (ref >59)
GFR, EST NON AFRICAN AMERICAN: 121 mL/min/{1.73_m2} (ref >59)
Glucose: 86 mg/dL (ref 65–99)
Potassium: 4.2 mmol/L (ref 3.5–5.2)
Sodium: 137 mmol/L (ref 134–144)

## 2017-02-02 LAB — LIPID PANEL
Chol/HDL Ratio: 3.6 ratio (ref 0.0–4.4)
Cholesterol, Total: 159 mg/dL (ref 100–199)
HDL: 44 mg/dL (ref >39)
LDL CALC: 98 mg/dL (ref 0–99)
TRIGLYCERIDES: 85 mg/dL (ref 0–149)
VLDL Cholesterol Cal: 17 mg/dL (ref 5–40)

## 2017-02-02 LAB — HEMOGLOBIN A1C
ESTIMATED AVERAGE GLUCOSE: 103 mg/dL
Hgb A1c MFr Bld: 5.2 % (ref 4.8–5.6)

## 2017-02-05 ENCOUNTER — Encounter: Payer: Self-pay | Admitting: Obstetrics and Gynecology

## 2017-02-05 DIAGNOSIS — M50322 Other cervical disc degeneration at C5-C6 level: Secondary | ICD-10-CM | POA: Diagnosis not present

## 2017-02-05 DIAGNOSIS — M5412 Radiculopathy, cervical region: Secondary | ICD-10-CM | POA: Diagnosis not present

## 2017-02-05 DIAGNOSIS — M542 Cervicalgia: Secondary | ICD-10-CM | POA: Diagnosis not present

## 2017-02-19 ENCOUNTER — Ambulatory Visit: Payer: 59 | Admitting: Obstetrics and Gynecology

## 2017-02-20 DIAGNOSIS — M542 Cervicalgia: Secondary | ICD-10-CM | POA: Diagnosis not present

## 2017-02-20 DIAGNOSIS — M50322 Other cervical disc degeneration at C5-C6 level: Secondary | ICD-10-CM | POA: Diagnosis not present

## 2017-02-20 DIAGNOSIS — M5412 Radiculopathy, cervical region: Secondary | ICD-10-CM | POA: Diagnosis not present

## 2017-03-09 ENCOUNTER — Ambulatory Visit (INDEPENDENT_AMBULATORY_CARE_PROVIDER_SITE_OTHER): Payer: 59 | Admitting: Obstetrics and Gynecology

## 2017-03-09 ENCOUNTER — Encounter: Payer: Self-pay | Admitting: Obstetrics and Gynecology

## 2017-03-09 VITALS — BP 122/76 | HR 97 | Wt 163.0 lb

## 2017-03-09 DIAGNOSIS — Z111 Encounter for screening for respiratory tuberculosis: Secondary | ICD-10-CM

## 2017-03-09 DIAGNOSIS — E663 Overweight: Secondary | ICD-10-CM | POA: Diagnosis not present

## 2017-03-09 DIAGNOSIS — Z6828 Body mass index (BMI) 28.0-28.9, adult: Secondary | ICD-10-CM | POA: Diagnosis not present

## 2017-03-09 MED ORDER — PHENTERMINE HCL 37.5 MG PO TABS: 37.5000 mg | ORAL_TABLET | Freq: Every day | ORAL | 0 refills | Status: DC

## 2017-03-09 NOTE — Progress Notes (Signed)
Gynecology Office Visit  Chief Complaint:  Chief Complaint  Patient presents with  . Weight Check    History of Present Illness: Patientis a 26 y.o. [place] female, who presents for the evaluation of the desire to lose weight. She has lost 7 pounds. The patient states the following symptoms since starting her weight loss therapy: appetite suppression, energy, and weight loss.  The patient also reports no other ill effects. The patient specifically denies heart palpitations, anxiety, and insomnia.    Review of Systems: 10 point review of systems negative unless otherwise noted in HPI  Past Medical History:  Past Medical History:  Diagnosis Date  . Asthma     Past Surgical History:  Past Surgical History:  Procedure Laterality Date  . APPENDECTOMY    . BREAST LUMPECTOMY    . FOOT SURGERY      Gynecologic History: Patient's last menstrual period was 02/08/2017.  Obstetric History: G0P0000  Family History:  Family History  Problem Relation Age of Onset  . Asthma Father   . Diabetes Other   . Heart failure Other     Social History:  Social History   Social History  . Marital status: Married    Spouse name: N/A  . Number of children: N/A  . Years of education: N/A   Occupational History  . Not on file.   Social History Main Topics  . Smoking status: Never Smoker  . Smokeless tobacco: Never Used  . Alcohol use No  . Drug use: No  . Sexual activity: Yes    Birth control/ protection: None   Other Topics Concern  . Not on file   Social History Narrative  . No narrative on file    Allergies:  Allergies  Allergen Reactions  . Cefoxitin Itching and Rash  . Amoxicillin Hives  . Asa [Aspirin] Hives  . Sulfa Antibiotics Hives    Medications: Prior to Admission medications   Medication Sig Start Date End Date Taking? Authorizing Provider  albuterol (PROVENTIL) (2.5 MG/3ML) 0.083% nebulizer solution Take 2.5 mg by nebulization every 6 (six) hours as  needed for wheezing or shortness of breath.   Yes [provider]  Albuterol Sulfate (PROAIR RESPICLICK) 323 (90 Base) MCG/ACT AEPB Inhale 1-2 puffs into the lungs every 6 (six) hours as needed (shortness of breath).   Yes [provider]  fluticasone (FLONASE) 50 MCG/ACT nasal spray Place 2 sprays into both nostrils daily as needed for allergies or rhinitis.   Yes [provider]  Fluticasone Furoate-Vilanterol (BREO ELLIPTA) 100-25 MCG/INH AEPB Inhale 1 puff into the lungs daily.   Yes [provider]  levocetirizine (XYZAL) 5 MG tablet Take 5 mg by mouth every evening.   Yes [provider]  montelukast (SINGULAIR) 10 MG tablet Take 10 mg by mouth at bedtime.   Yes [provider]  phentermine (ADIPEX-P) 37.5 MG tablet Take 1 tablet (37.5 mg total) by mouth daily before breakfast. 01/18/17  Yes Malachy Mood, MD    Physical Exam Blood pressure 122/76, pulse 97, weight 163 lb (73.9 kg), last menstrual period 02/08/2017. Body mass index is 28.87 kg/m.  General: NAD HEENT: normocephalic, anicteric Thyroid: no enlargement Pulmonary: no increased work of breathing Neurologic: Grossly intact Psychiatric: mood appropriate, affect full  Assessment: 26 y.o. G0P0000 No problem-specific Assessment & Plan notes found for this encounter.   Plan: Problem List Items Addressed This Visit    None    Visit Diagnoses    Tuberculosis  screening    -  Primary   Relevant Orders   Quantiferon tb gold assay (blood)   Overweight (BMI 25.0-29.9)       BMI 28.0-28.9,adult          1) 1500 Calorie ADA Diet  2) Patient education given regarding appropriate lifestyle changes for weight loss including: regular physical activity, healthy coping strategies, caloric restriction and healthy eating patterns.  3) Patient will be started on weight loss medication. The risks and benefits and side effects of medication, such as Adipex (Phenteramine) ,   Tenuate (Diethylproprion), Belviq (lorcarsin), Contrave (buproprion/naltrexone), Qsymia (phentermine/topiramate), and Saxenda (liraglutide) is discussed. The pros and cons of suppressing appetite and boosting metabolism is discussed. Risks of tolerence and addiction is discussed for selected agents discussed. Use of medicine will ne short term, such as 3-4 months at a time followed by a period of time off of the medicine to avoid these risks and side effects for Adipex, Qsymia, and Tenuate discussed. Pt to call with any negative side effects and agrees to keep follow up appts.  4) Patient to take medication, with the benefits of appetite suppression and metabolism boost d/w pt, along with the side effects and risk factors of long term use that will be avoided with our use of short bursts of therapy. Rx provided.    5) 15 minutes face-to-face; with counseling/coordination of care > 50 percent of visit related to obesity and ongoing management/treatment   6) Follow up in 4 weeks to assess response

## 2017-03-20 LAB — QUANTIFERON IN TUBE
QFT TB AG MINUS NIL VALUE: 0 IU/mL
QUANTIFERON MITOGEN VALUE: >10 IU/mL
QUANTIFERON NIL VALUE: 0.04 [IU]/mL
QUANTIFERON TB AG VALUE: 0.04 [IU]/mL
QUANTIFERON TB GOLD: NEGATIVE

## 2017-03-20 LAB — QUANTIFERON TB GOLD ASSAY (BLOOD)

## 2017-06-04 ENCOUNTER — Ambulatory Visit: Payer: 59 | Admitting: Obstetrics and Gynecology

## 2017-07-23 DIAGNOSIS — J4540 Moderate persistent asthma, uncomplicated: Secondary | ICD-10-CM | POA: Diagnosis not present

## 2017-11-12 DIAGNOSIS — J4540 Moderate persistent asthma, uncomplicated: Secondary | ICD-10-CM | POA: Diagnosis not present

## 2017-11-19 DIAGNOSIS — J4540 Moderate persistent asthma, uncomplicated: Secondary | ICD-10-CM | POA: Diagnosis not present

## 2017-11-24 ENCOUNTER — Encounter (HOSPITAL_COMMUNITY): Payer: Self-pay | Admitting: Emergency Medicine

## 2017-11-24 ENCOUNTER — Emergency Department (HOSPITAL_COMMUNITY)
Admission: EM | Admit: 2017-11-24 | Discharge: 2017-11-24 | Disposition: A | Discharge status: Home / Self Care | Payer: 59 | Attending: Emergency Medicine | Admitting: Emergency Medicine

## 2017-11-24 ENCOUNTER — Emergency Department (HOSPITAL_COMMUNITY): Payer: 59

## 2017-11-24 ENCOUNTER — Other Ambulatory Visit: Payer: Self-pay

## 2017-11-24 DIAGNOSIS — J4541 Moderate persistent asthma with (acute) exacerbation: Secondary | ICD-10-CM

## 2017-11-24 DIAGNOSIS — Z79899 Other long term (current) drug therapy: Secondary | ICD-10-CM | POA: Diagnosis not present

## 2017-11-24 DIAGNOSIS — J40 Bronchitis, not specified as acute or chronic: Secondary | ICD-10-CM | POA: Diagnosis not present

## 2017-11-24 DIAGNOSIS — R05 Cough: Secondary | ICD-10-CM | POA: Diagnosis not present

## 2017-11-24 DIAGNOSIS — R42 Dizziness and giddiness: Secondary | ICD-10-CM | POA: Diagnosis not present

## 2017-11-24 DIAGNOSIS — R0602 Shortness of breath: Secondary | ICD-10-CM | POA: Diagnosis present

## 2017-11-24 LAB — CBC WITH DIFFERENTIAL/PLATELET
BASOS PCT: 0 %
Basophils Absolute: 0 10*3/uL (ref 0.0–0.1)
Eosinophils Absolute: 0.4 10*3/uL (ref 0.0–0.7)
Eosinophils Relative: 2 %
HEMATOCRIT: 40.6 % (ref 36.0–46.0)
Hemoglobin: 13.8 g/dL (ref 12.0–15.0)
Lymphocytes Relative: 41 %
Lymphs Abs: 7.7 10*3/uL — ABNORMAL HIGH (ref 0.7–4.0)
MCH: 29.7 pg (ref 26.0–34.0)
MCHC: 34 g/dL (ref 30.0–36.0)
MCV: 87.3 fL (ref 78.0–100.0)
MONO ABS: 2.2 10*3/uL — AB (ref 0.1–1.0)
Monocytes Relative: 12 %
NEUTROS ABS: 8.4 10*3/uL — AB (ref 1.7–7.7)
NEUTROS PCT: 45 %
PLATELETS: 315 10*3/uL (ref 150–400)
RBC: 4.65 MIL/uL (ref 3.87–5.11)
RDW: 13.1 % (ref 11.5–15.5)
WBC: 18.7 10*3/uL — ABNORMAL HIGH (ref 4.0–10.5)

## 2017-11-24 LAB — BASIC METABOLIC PANEL
Anion gap: 10 (ref 5–15)
BUN: 14 mg/dL (ref 6–20)
CALCIUM: 8.9 mg/dL (ref 8.9–10.3)
CO2: 27 mmol/L (ref 22–32)
CREATININE: 0.77 mg/dL (ref 0.44–1.00)
Chloride: 101 mmol/L (ref 101–111)
GFR calc non Af Amer: >60 mL/min (ref >60)
Glucose, Bld: 97 mg/dL (ref 65–99)
Potassium: 3.2 mmol/L — ABNORMAL LOW (ref 3.5–5.1)
Sodium: 138 mmol/L (ref 135–145)

## 2017-11-24 LAB — D-DIMER, QUANTITATIVE: D-Dimer, Quant: 0.27 ug/mL-FEU (ref 0.00–0.50)

## 2017-11-24 MED ORDER — DEXAMETHASONE SODIUM PHOSPHATE 4 MG/ML IJ SOLN
10.0000 mg | Freq: Once | INTRAMUSCULAR | Status: AC
  Administered 2017-11-24: 10 mg via INTRAMUSCULAR
  Filled 2017-11-24: qty 3

## 2017-11-24 MED ORDER — LORAZEPAM 1 MG PO TABS: 1.0000 mg | ORAL_TABLET | Freq: Three times a day (TID) | ORAL | 0 refills | Status: DC | PRN

## 2017-11-24 MED ORDER — BENZONATATE 100 MG PO CAPS: 100.0000 mg | ORAL_CAPSULE | Freq: Three times a day (TID) | ORAL | 0 refills | Status: DC

## 2017-11-24 MED ORDER — PREDNISONE 10 MG PO TABS: 40.0000 mg | ORAL_TABLET | Freq: Every day | ORAL | 0 refills | Status: DC

## 2017-11-24 MED ORDER — POTASSIUM CHLORIDE CRYS ER 20 MEQ PO TBCR
40.0000 meq | EXTENDED_RELEASE_TABLET | Freq: Once | ORAL | Status: AC
  Administered 2017-11-24: 40 meq via ORAL
  Filled 2017-11-24: qty 2

## 2017-11-24 MED ORDER — ALBUTEROL SULFATE (2.5 MG/3ML) 0.083% IN NEBU
5.0000 mg | INHALATION_SOLUTION | Freq: Once | RESPIRATORY_TRACT | Status: AC
  Administered 2017-11-24: 5 mg via RESPIRATORY_TRACT
  Filled 2017-11-24: qty 6

## 2017-11-24 NOTE — ED Triage Notes (Signed)
Pt c/o persistent SOB and chest tightness x 2 weeks. Pt with hx of asthma and seasonal allergies. Pt reports no relief with inhaler and 2 does of steroids.

## 2017-11-24 NOTE — Discharge Instructions (Addendum)
Please take the medications prescribed for your asthma exacerbation.  We think that the underlying cause is bronchitis, and that it is a matter time before symptoms get better. If you feel like there is an element of anxiety causing your symptoms, take Ativan as needed and is prescribed.

## 2017-11-24 NOTE — ED Provider Notes (Signed)
[facility] EMERGENCY DEPARTMENT Provider Note   CSN: [id number] Arrival date & time: 11/24/17  1449     History   Chief Complaint Chief Complaint  Patient presents with  . Shortness of Breath    HPI Nicole Rodriguez is a 27 y.o. female.  HPI 27 year old female with history of asthma comes in with chief complaint of shortness of breath.  Patient states that over the past 2 weeks she has had worsening shortness of breath and cough along with chest pain that is described as tightness and which is worse with inspiration and cough.  Patient has been seeing her PCP and she is status post 2 rounds of steroids and even a round of antibiotics without any relief.   Pt has no hx of PE, DVT and denies any exogenous hormone (testosterone / estrogen) use, long distance travels or surgery in the past 6 weeks, active cancer, recent immobilization.   Past Medical History:  Diagnosis Date  . Asthma     There are no active problems to display for this patient.   Past Surgical History:  Procedure Laterality Date  . APPENDECTOMY    . BREAST LUMPECTOMY    . FOOT SURGERY       OB History    Gravida  0   Para  0   Term  0   Preterm  0   AB  0   Living  0     SAB  0   TAB  0   Ectopic  0   Multiple  0   Live Births  0            Home Medications    Prior to Admission medications   Medication Sig Start Date End Date Taking? Authorizing Provider  albuterol (PROVENTIL) (2.5 MG/3ML) 0.083% nebulizer solution Take 2.5 mg by nebulization every 6 (six) hours as needed for wheezing or shortness of breath.   Yes [provider]  Albuterol Sulfate (PROAIR RESPICLICK) 834 (90 Base) MCG/ACT AEPB Inhale 1-2 puffs into the lungs every 6 (six) hours as needed (shortness of breath).   Yes [provider]  fluticasone (FLONASE) 50 MCG/ACT nasal spray Place 1 spray into both nostrils daily.    Yes [provider]  fluticasone furoate-vilanterol (BREO  ELLIPTA) 200-25 MCG/INH AEPB Inhale 1 puff into the lungs daily.    Yes [provider]  levocetirizine (XYZAL) 5 MG tablet Take 5 mg by mouth every evening.   Yes [provider]  montelukast (SINGULAIR) 10 MG tablet Take 10 mg by mouth at bedtime.   Yes [provider]  benzonatate (TESSALON) 100 MG capsule Take 1 capsule (100 mg total) by mouth every 8 (eight) hours. 11/24/17   Varney Biles, MD  LORazepam (ATIVAN) 1 MG tablet Take 1 tablet (1 mg total) by mouth 3 (three) times daily as needed for anxiety. 11/24/17   Varney Biles, MD  predniSONE (DELTASONE) 10 MG tablet Take 4 tablets (40 mg total) by mouth daily. 11/25/17   Varney Biles, MD    Family History Family History  Problem Relation Age of Onset  . Asthma Father   . Diabetes Other   . Heart failure Other     Social History Social History   Tobacco Use  . Smoking status: Never Smoker  . Smokeless tobacco: Never Used  Substance Use Topics  . Alcohol use: Yes    Comment: socially   . Drug use: No     Allergies  Cefoxitin; Amoxicillin; Asa [aspirin]; and Sulfa antibiotics   Review of Systems Review of Systems  Constitutional: Positive for activity change.  Respiratory: Positive for shortness of breath.   Cardiovascular: Positive for chest pain.  Neurological: Positive for dizziness.  All other systems reviewed and are negative.    Physical Exam Updated Vital Signs BP 116/73   Pulse 99   Temp 98.1 F (36.7 C) (Oral)   Resp (!) 23   Ht 5\' 3"  (1.6 m)   Wt 78 kg (172 lb)   LMP 11/04/2017 (Approximate)   SpO2 98%   BMI 30.47 kg/m   Physical Exam  Constitutional: She is oriented to person, place, and time. She appears well-developed.  HENT:  Head: Normocephalic and atraumatic.  Eyes: EOM are normal.  Neck: Normal range of motion. Neck supple.  Cardiovascular: Regular rhythm.  Tachycardia  Pulmonary/Chest: Breath sounds normal. Tachypnea noted.  Abdominal: Bowel  sounds are normal.  Neurological: She is alert and oriented to person, place, and time.  Skin: Skin is warm and dry.  Nursing note and vitals reviewed.    ED Treatments / Results  Labs (all labs ordered are listed, but only abnormal results are displayed) Labs Reviewed  BASIC METABOLIC PANEL - Abnormal; Notable for the following components:      Result Value   Potassium 3.2 (*)    All other components within normal limits  CBC WITH DIFFERENTIAL/PLATELET - Abnormal; Notable for the following components:   WBC 18.7 (*)    Neutro Abs 8.4 (*)    Lymphs Abs 7.7 (*)    Monocytes Absolute 2.2 (*)    All other components within normal limits  D-DIMER, QUANTITATIVE (NOT AT [facility])  PATHOLOGIST SMEAR REVIEW      Radiology Dg Chest 2 View  Result Date: 11/24/2017 CLINICAL DATA:  Cough and shortness of breath for the past 2 weeks. EXAM: CHEST - 2 VIEW COMPARISON:  07/05/2015. FINDINGS: Normal sized heart. Clear lungs. Stable mild diffuse peribronchial thickening. Unremarkable bones. IMPRESSION: Stable mild bronchitic changes. Electronically Signed   By: Claudie Revering M.D.   On: 11/24/2017 15:43   EKG:  EKG Interpretation  Date/Time:  Saturday Nov 24 2017 14:59:02 EDT Ventricular Rate:  104 PR Interval:  122 QRS Duration: 76 QT Interval:  328 QTC Calculation: 431 R Axis:   144 Text Interpretation:  Unusual P axis, possible ectopic atrial tachycardia Right axis deviation Abnormal ECG Reconfirmed by Varney Biles [phone]) on 11/24/2017 4:50:21 PM        Procedures Procedures (including critical care time)  Medications Ordered in ED Medications  dexamethasone (DECADRON) injection 10 mg (has no administration in time range)  potassium chloride SA (K-DUR,KLOR-CON) CR tablet 40 mEq (has no administration in time range)  albuterol (PROVENTIL) (2.5 MG/3ML) 0.083% nebulizer solution 5 mg (5 mg Nebulization Given 11/24/17 1537)     Initial Impression / Assessment and Plan / ED Course    I have reviewed the triage vital signs and the nursing notes.  Pertinent labs & imaging results that were available during my care of the patient were reviewed by me and considered in my medical decision making (see chart for details).  Clinical Course as of Nov 24 1745  Sat Nov 24, 2017  1746 Likely due to prednisone  WBC(!): 18.7 [AN]  1746 Persistent bronchitic picture on the x-ray. I spoke with patient's PCP.  He advised that he will see the patient on Tuesday, however he thinks that it could be an element of  anxiety and request that we give her Ativan until patient is seen by him to see if there is any improvement with it.  DG Chest 2 View [AN]  5300 Results from the ER workup discussed with the patient face to face and all questions answered to the best of my ability.    [AN]    Clinical Course User Index [AN] Varney Biles, MD   27 year old female comes in with chief complaint of shortness of breath. She has history of asthma and has undergone multiple rounds of outpatient steroids without any significant relief.  She states that she has been taking her nebulizer without significant relief.  Patient has associated cough, wheezing and pleuritic chest pain.  On my exam her lungs are clear, however she is status post 1 round of albuterol treatment by RT prior to my ED evaluation.  We will get a d-dimer to rule out PE given that her lungs are clear however she continues to have chest tightness and shortness of breath.  Basic labs have also been ordered.  X-ray shows bronchitis-like picture -so this could be all part of her viral prodrome or hypersensitivity due to an allergen. Final Clinical Impressions(s) / ED Diagnoses   Final diagnoses:  Bronchitis  Moderate persistent asthma with exacerbation    ED Discharge Orders        Ordered    predniSONE (DELTASONE) 10 MG tablet  Daily     11/24/17 1745    benzonatate (TESSALON) 100 MG capsule  Every 8 hours     11/24/17 1745     LORazepam (ATIVAN) 1 MG tablet  3 times daily PRN     11/24/17 1745       Varney Biles, MD 11/24/17 1747

## 2017-11-27 DIAGNOSIS — J4540 Moderate persistent asthma, uncomplicated: Secondary | ICD-10-CM | POA: Diagnosis not present

## 2017-11-28 LAB — PATHOLOGIST SMEAR REVIEW

## 2017-11-29 ENCOUNTER — Encounter: Payer: Self-pay | Admitting: Emergency Medicine

## 2017-11-29 ENCOUNTER — Encounter: Payer: Self-pay | Admitting: *Deleted

## 2017-11-29 ENCOUNTER — Ambulatory Visit (INDEPENDENT_AMBULATORY_CARE_PROVIDER_SITE_OTHER): Payer: 59 | Admitting: Emergency Medicine

## 2017-11-29 VITALS — BP 122/92 | HR 115 | Ht 63.0 in | Wt 174.0 lb

## 2017-11-29 DIAGNOSIS — J45909 Unspecified asthma, uncomplicated: Secondary | ICD-10-CM | POA: Diagnosis not present

## 2017-11-29 DIAGNOSIS — R05 Cough: Secondary | ICD-10-CM | POA: Diagnosis not present

## 2017-11-29 DIAGNOSIS — R059 Cough, unspecified: Secondary | ICD-10-CM

## 2017-11-29 DIAGNOSIS — R053 Chronic cough: Secondary | ICD-10-CM

## 2017-11-29 MED ORDER — PANTOPRAZOLE SODIUM 40 MG PO TBEC: 40.0000 mg | DELAYED_RELEASE_TABLET | Freq: Every day | ORAL | 5 refills | Status: DC

## 2017-11-29 NOTE — Patient Instructions (Addendum)
Please continue Singulair, Xyzal, Flonase nasal spray as you have been using them. Please start Protonix 40 mg daily until our next visit.  Take this medication 1 hour before or 1 hour after eating. Please stop your Pulmicort now. Continue Breo 1 inhalation daily.  Remember to rinse and gargle after using this medication. Use albuterol either 2 puffs or 1 nebulizer treatment up to every 4 hours if needed for chest tightness, cough, wheezing, shortness of breath. You may return to work on Monday, June 3.  You need to remember to avoid dust exposure,  usual personal protective equipment when indicated. We will perform full pulmonary function testing. Follow with Dr Lamonte Sakai in 1 month or next available with full PFT on the same day

## 2017-11-29 NOTE — Assessment & Plan Note (Signed)
This is her most persistent and bothersome symptom.  Certainly could reflect manifestations of asthma but I suspect she also has upper airway irritation based on her failure to always respond to bronchodilators, failure to always respond to prednisone.  She denies a lot of GERD but this may be a contributor.  I like to treat empirically and she is willing to do so.  She is on a good allergy regimen and we will continue this.  I am concerned that the Brio may be irritating her upper airway and we may need to change to an alternative in the future.  We will perform pulmonary function testing to quantify how much upper versus lower airways involvement is present.

## 2017-11-29 NOTE — Assessment & Plan Note (Signed)
She carries a diagnosis of asthma and some of her symptoms are characteristic including wheezing, chest tightness, dyspnea.  Unclear whether her cough is a separate issue or if she has superimposed upper airway disease.  She needs pulmonary function testing to quantify her degree of airflow obstruction.  I will stop her Pulmicort, continue the [facility] for now although she may need to substitute as we go forward.  Treat potential contributors to sustained symptoms including GERD, allergic rhinitis.  Identify triggers and attempt to avoid.  I told her it is okay to go back to work as long she wears required PPE.  Please continue Singulair, Xyzal, Flonase nasal spray as you have been using them. Please start Protonix 40 mg daily until our next visit.  Take this medication 1 hour before or 1 hour after eating. Please stop your Pulmicort now. Continue Breo 1 inhalation daily.  Remember to rinse and gargle after using this medication. Use albuterol either 2 puffs or 1 nebulizer treatment up to every 4 hours if needed for chest tightness, cough, wheezing, shortness of breath. You may return to work on Monday, June 3.  You need to remember to avoid dust exposure,  usual personal protective equipment when indicated. We will perform full pulmonary function testing. Follow with Dr Lamonte Sakai in 1 month or next available with full PFT on the same day

## 2017-11-29 NOTE — Progress Notes (Signed)
Subjective:    Patient ID: Nicole Rodriguez, female    DOB: [birth year], 27 y.o.   MRN: [id number]  HPI 27 year old never smoker with a history of asthma that was made clinically in 2016.  She has been dealing with chronic cough, chest tightness and heaviness. The heaviness is worst w activity and at night. The cough is worst at night, can produce thick mucous.  Happens with exertion, cold air, fumes, smoke. She has some allergic rhinitis. Rare reflux sx. Interestingly her sx are not reliably relieved by BD or by prednisone. pulmicort was added to breo recently.   She works at [employer], breathes in dusts, solvents.   Review of Systems  Constitutional: Negative for fever and unexpected weight change.  HENT: Negative for congestion, dental problem, ear pain, nosebleeds, postnasal drip, rhinorrhea, sinus pressure, sneezing, sore throat and trouble swallowing.   Eyes: Negative for redness and itching.  Respiratory: Positive for cough, chest tightness, shortness of breath and wheezing.   Cardiovascular: Negative for palpitations and leg swelling.  Gastrointestinal: Negative for nausea and vomiting.  Genitourinary: Negative for dysuria.  Musculoskeletal: Negative for joint swelling.  Skin: Negative for rash.  Neurological: Negative for headaches.  Hematological: Does not bruise/bleed easily.  Psychiatric/Behavioral: Negative for dysphoric mood. The patient is not nervous/anxious.     Past Medical History:  Diagnosis Date  . Asthma      Family History  Problem Relation Age of Onset  . Asthma Father   . Diabetes Other   . Heart failure Other      Social History   Socioeconomic History  . Marital status: Married    Spouse name: Not on file  . Number of children: Not on file  . Years of education: Not on file  . Highest education level: Not on file  Occupational History  . Not on file  Social Needs  . Financial resource strain: Not on file  . Food insecurity:    Worry: Not on file    Inability: Not on file  . Transportation needs:    Medical: Not on file    Non-medical: Not on file  Tobacco Use  . Smoking status: Never Smoker  . Smokeless tobacco: Never Used  Substance and Sexual Activity  . Alcohol use: Yes    Comment: socially   . Drug use: No  . Sexual activity: Yes    Birth control/protection: None  Lifestyle  . Physical activity:    Days per week: Not on file    Minutes per session: Not on file  . Stress: Not on file  Relationships  . Social connections:    Talks on phone: Not on file    Gets together: Not on file    Attends religious service: Not on file    Active member of club or organization: Not on file    Attends meetings of clubs or organizations: Not on file    Relationship status: Not on file  . Intimate partner violence:    Fear of current or ex partner: Not on file    Emotionally abused: Not on file    Physically abused: Not on file    Forced sexual activity: Not on file  Other Topics Concern  . Not on file  Social History Narrative  . Not on file  has lived in [place], [place]; no mold exposure.  She works at [employer], breathes in dusts, solvents.   Allergies  Allergen Reactions  . Cefoxitin Itching and Rash  . Amoxicillin Hives  .  Asa [Aspirin] Hives  . Sulfa Antibiotics Hives     Outpatient Medications Prior to Visit  Medication Sig Dispense Refill  . albuterol (PROVENTIL) (2.5 MG/3ML) 0.083% nebulizer solution Take 2.5 mg by nebulization every 6 (six) hours as needed for wheezing or shortness of breath.    . Albuterol Sulfate (PROAIR RESPICLICK) 381 (90 Base) MCG/ACT AEPB Inhale 1-2 puffs into the lungs every 6 (six) hours as needed (shortness of breath).    . benzonatate (TESSALON) 100 MG capsule Take 1 capsule (100 mg total) by mouth every 8 (eight) hours. 21 capsule 0  . fluticasone (FLONASE) 50 MCG/ACT nasal spray Place 1 spray into both nostrils daily.     . fluticasone furoate-vilanterol (BREO ELLIPTA) 200-25 MCG/INH AEPB Inhale 1  puff into the lungs daily.     Marland Kitchen levocetirizine (XYZAL) 5 MG tablet Take 5 mg by mouth every evening.    Marland Kitchen LORazepam (ATIVAN) 0.5 MG tablet Take 0.5 mg by mouth every 8 (eight) hours.    . montelukast (SINGULAIR) 10 MG tablet Take 10 mg by mouth at bedtime.    Marland Kitchen LORazepam (ATIVAN) 1 MG tablet Take 1 tablet (1 mg total) by mouth 3 (three) times daily as needed for anxiety. 10 tablet 0  . predniSONE (DELTASONE) 10 MG tablet Take 4 tablets (40 mg total) by mouth daily. 16 tablet 0   No facility-administered medications prior to visit.         Objective:   Physical Exam Vitals:   11/29/17 1607 11/29/17 1608  BP:  (!) 122/92  Pulse:  (!) 115  SpO2:  97%  Weight: 174 lb (78.9 kg)   Height: 5\' 3"  (1.6 m)    Gen: Pleasant, well-nourished, in no distress,  normal affect, coughing throughout the visit  ENT: No lesions,  mouth clear,  oropharynx clear, no postnasal drip  Neck: No JVD, no stridor  Lungs: No use of accessory muscles, no wheeze  Cardiovascular: RRR, heart sounds normal, no murmur or gallops, no peripheral edema  Musculoskeletal: No deformities, no cyanosis or clubbing  Neuro: alert, non focal  Skin: Warm, no lesions or rash    Assessment & Plan:  Chronic cough This is her most persistent and bothersome symptom.  Certainly could reflect manifestations of asthma but I suspect she also has upper airway irritation based on her failure to always respond to bronchodilators, failure to always respond to prednisone.  She denies a lot of GERD but this may be a contributor.  I like to treat empirically and she is willing to do so.  She is on a good allergy regimen and we will continue this.  I am concerned that the Brio may be irritating her upper airway and we may need to change to an alternative in the future.  We will perform pulmonary function testing to quantify how much upper versus lower airways involvement is present.    Asthma She carries a diagnosis of asthma and some  of her symptoms are characteristic including wheezing, chest tightness, dyspnea.  Unclear whether her cough is a separate issue or if she has superimposed upper airway disease.  She needs pulmonary function testing to quantify her degree of airflow obstruction.  I will stop her Pulmicort, continue the [facility] for now although she may need to substitute as we go forward.  Treat potential contributors to sustained symptoms including GERD, allergic rhinitis.  Identify triggers and attempt to avoid.  I told her it is okay to go back to work as  long she wears required PPE.  Please continue Singulair, Xyzal, Flonase nasal spray as you have been using them. Please start Protonix 40 mg daily until our next visit.  Take this medication 1 hour before or 1 hour after eating. Please stop your Pulmicort now. Continue Breo 1 inhalation daily.  Remember to rinse and gargle after using this medication. Use albuterol either 2 puffs or 1 nebulizer treatment up to every 4 hours if needed for chest tightness, cough, wheezing, shortness of breath. You may return to work on Monday, June 3.  You need to remember to avoid dust exposure,  usual personal protective equipment when indicated. We will perform full pulmonary function testing. Follow with Dr Lamonte Sakai in 1 month or next available with full PFT on the same day   Baltazar Apo, MD, PhD 11/29/2017, 5:00 PM [facility] [phone] or if no answer [phone]

## 2017-12-04 ENCOUNTER — Encounter: Payer: Self-pay | Admitting: Emergency Medicine

## 2017-12-04 NOTE — Telephone Encounter (Signed)
Have her try taking her reflux medication bid for a week, then go back to QD. We will see if this makes a difference

## 2017-12-04 NOTE — Telephone Encounter (Signed)
Hello Dr. Lamonte Sakai,  I am finally getting some relief with the acid reflux medicine. However, I'm noticing that at times it still feels like the acid is coming up into my throat. Do you think it would be best to start taking the acid reflux medicine twice a day instead of once? Just curious or would you rather me just eat some tums and wait until after my PFT?   Have a great day.   Thank you,  Nicole Rodriguez   RB please advise. Thanks

## 2017-12-14 ENCOUNTER — Ambulatory Visit (INDEPENDENT_AMBULATORY_CARE_PROVIDER_SITE_OTHER): Payer: 59 | Admitting: Emergency Medicine

## 2017-12-14 DIAGNOSIS — R059 Cough, unspecified: Secondary | ICD-10-CM

## 2017-12-14 DIAGNOSIS — R05 Cough: Secondary | ICD-10-CM

## 2017-12-14 LAB — PULMONARY FUNCTION TEST
DL/VA % pred: 110 %
DL/VA: 5.02 ml/min/mmHg/L
DLCO COR % PRED: 99 %
DLCO UNC: 21.63 ml/min/mmHg
DLCO cor: 21.37 ml/min/mmHg
DLCO unc % pred: 100 %
FEF 25-75 POST: 4.18 L/s
FEF 25-75 Pre: 3.89 L/sec
FEF2575-%Change-Post: 7 %
FEF2575-%Pred-Post: 122 %
FEF2575-%Pred-Pre: 114 %
FEV1-%Change-Post: 1 %
FEV1-%PRED-POST: 95 %
FEV1-%PRED-PRE: 93 %
FEV1-POST: 2.88 L
FEV1-PRE: 2.83 L
FEV1FVC-%Change-Post: 0 %
FEV1FVC-%Pred-Pre: 104 %
FEV6-%Change-Post: 2 %
FEV6-%PRED-PRE: 90 %
FEV6-%Pred-Post: 92 %
FEV6-POST: 3.27 L
FEV6-Pre: 3.2 L
FEV6FVC-%PRED-POST: 100 %
FEV6FVC-%Pred-Pre: 100 %
FVC-%CHANGE-POST: 2 %
FVC-%PRED-PRE: 90 %
FVC-%Pred-Post: 92 %
FVC-POST: 3.28 L
FVC-PRE: 3.2 L
POST FEV6/FVC RATIO: 100 %
PRE FEV1/FVC RATIO: 88 %
Post FEV1/FVC ratio: 88 %
Pre FEV6/FVC Ratio: 100 %
RV % pred: 139 %
RV: 1.72 L
TLC % PRED: 106 %
TLC: 5.08 L

## 2017-12-14 NOTE — Progress Notes (Signed)
Patient completed full PFT today. 

## 2017-12-17 ENCOUNTER — Encounter: Payer: Self-pay | Admitting: Emergency Medicine

## 2017-12-17 MED ORDER — PANTOPRAZOLE SODIUM 40 MG PO TBEC: 40.0000 mg | DELAYED_RELEASE_TABLET | Freq: Two times a day (BID) | ORAL | 5 refills | Status: DC

## 2017-12-27 ENCOUNTER — Ambulatory Visit (INDEPENDENT_AMBULATORY_CARE_PROVIDER_SITE_OTHER): Payer: 59 | Admitting: Emergency Medicine

## 2017-12-27 ENCOUNTER — Encounter: Payer: Self-pay | Admitting: Emergency Medicine

## 2017-12-27 DIAGNOSIS — R05 Cough: Secondary | ICD-10-CM

## 2017-12-27 DIAGNOSIS — J45909 Unspecified asthma, uncomplicated: Secondary | ICD-10-CM

## 2017-12-27 DIAGNOSIS — R053 Chronic cough: Secondary | ICD-10-CM

## 2017-12-27 NOTE — Patient Instructions (Addendum)
Please continue Protonix 40 mg twice a day for now.  We will consider decreasing in the future Please continue Singulair, Xyzal, Flonase as you have been taking them. Stop Breo for now.  Please call our office if you notice any decline in your breathing while you are off of the medication. Keep your albuterol available to use 2 puffs if needed for shortness of breath, coughing spells, wheezing. If your breathing changes or if we need to investigate further then we can consider provocational pulmonary function testing Follow with Dr Lamonte Sakai in 4 months or sooner if you have any problems.

## 2017-12-27 NOTE — Progress Notes (Signed)
Subjective:    Patient ID: Nicole Rodriguez, female    DOB: 09-10-1990, 27 y.o.   MRN: [id number]  HPI 27 year old never smoker with a history of asthma that was made clinically in 2016.  She has been dealing with chronic cough, chest tightness and heaviness. The heaviness is worst w activity and at night. The cough is worst at night, can produce thick mucous.  Happens with exertion, cold air, fumes, smoke. She has some allergic rhinitis. Rare reflux sx. Interestingly her sx are not reliably relieved by BD or by prednisone. pulmicort was added to breo recently.   She works at [employer], breathes in dusts, solvents.   ROV 12/27/17 --follow-up visit for never smoker with a history of clinical asthma.  She also has allergic rhinitis and chronic cough.  Symptoms include chest tightness and heaviness especially with exertion or with particular exposures.  She is been treated with bronchodilators, prednisone without much response.  At her initial visit I continued Breo and stopped Pulmicort.  We tried to treat potential contributors like GERD and allergic rhinitis.  She had pulmonary function testing done on 6/14 that showed normal spirometry without a bronchodilator response, borderline hyperinflated lung volumes and normal DLCO. Her cough is better > the protonix bid has been very helpful.     Review of Systems  Constitutional: Negative for fever and unexpected weight change.  HENT: Negative for congestion, dental problem, ear pain, nosebleeds, postnasal drip, rhinorrhea, sinus pressure, sneezing, sore throat and trouble swallowing.   Eyes: Negative for redness and itching.  Respiratory: Positive for cough, chest tightness, shortness of breath and wheezing.   Cardiovascular: Negative for palpitations and leg swelling.  Gastrointestinal: Negative for nausea and vomiting.  Genitourinary: Negative for dysuria.  Musculoskeletal: Negative for joint swelling.  Skin: Negative for rash.  Neurological: Negative  for headaches.  Hematological: Does not bruise/bleed easily.  Psychiatric/Behavioral: Negative for dysphoric mood. The patient is not nervous/anxious.     Past Medical History:  Diagnosis Date  . Asthma      Family History  Problem Relation Age of Onset  . Asthma Father   . Diabetes Other   . Heart failure Other      Social History   Socioeconomic History  . Marital status: Married    Spouse name: Not on file  . Number of children: Not on file  . Years of education: Not on file  . Highest education level: Not on file  Occupational History  . Not on file  Social Needs  . Financial resource strain: Not on file  . Food insecurity:    Worry: Not on file    Inability: Not on file  . Transportation needs:    Medical: Not on file    Non-medical: Not on file  Tobacco Use  . Smoking status: Never Smoker  . Smokeless tobacco: Never Used  Substance and Sexual Activity  . Alcohol use: Yes    Comment: socially   . Drug use: No  . Sexual activity: Yes    Birth control/protection: None  Lifestyle  . Physical activity:    Days per week: Not on file    Minutes per session: Not on file  . Stress: Not on file  Relationships  . Social connections:    Talks on phone: Not on file    Gets together: Not on file    Attends religious service: Not on file    Active member of club or organization: Not on file  Attends meetings of clubs or organizations: Not on file    Relationship status: Not on file  . Intimate partner violence:    Fear of current or ex partner: Not on file    Emotionally abused: Not on file    Physically abused: Not on file    Forced sexual activity: Not on file  Other Topics Concern  . Not on file  Social History Narrative  . Not on file  has lived in [place], [place]; no mold exposure.  She works at [employer], breathes in dusts, solvents.   Allergies  Allergen Reactions  . Cefoxitin Itching and Rash  . Amoxicillin Hives  . Asa [Aspirin] Hives  . Sulfa Antibiotics  Hives     Outpatient Medications Prior to Visit  Medication Sig Dispense Refill  . albuterol (PROVENTIL) (2.5 MG/3ML) 0.083% nebulizer solution Take 2.5 mg by nebulization every 6 (six) hours as needed for wheezing or shortness of breath.    . Albuterol Sulfate (PROAIR RESPICLICK) 194 (90 Base) MCG/ACT AEPB Inhale 1-2 puffs into the lungs every 6 (six) hours as needed (shortness of breath).    . fluticasone (FLONASE) 50 MCG/ACT nasal spray Place 1 spray into both nostrils daily.     . fluticasone furoate-vilanterol (BREO ELLIPTA) 200-25 MCG/INH AEPB Inhale 1 puff into the lungs daily.     Marland Kitchen levocetirizine (XYZAL) 5 MG tablet Take 5 mg by mouth every evening.    Marland Kitchen LORazepam (ATIVAN) 0.5 MG tablet Take 0.5 mg by mouth every 8 (eight) hours.    . montelukast (SINGULAIR) 10 MG tablet Take 10 mg by mouth at bedtime.    . pantoprazole (PROTONIX) 40 MG tablet Take 1 tablet (40 mg total) by mouth 2 (two) times daily. 60 tablet 5  . benzonatate (TESSALON) 100 MG capsule Take 1 capsule (100 mg total) by mouth every 8 (eight) hours. 21 capsule 0   No facility-administered medications prior to visit.         Objective:   Physical Exam Vitals:   12/27/17 1559  BP: 118/70  Pulse: 87  SpO2: 95%  Weight: 179 lb 9.6 oz (81.5 kg)  Height: 5\' 2"  (1.575 m)   Gen: Pleasant, well-nourished, in no distress,  normal affect, not coughing today  ENT: No lesions,  mouth clear,  oropharynx clear, no postnasal drip  Neck: No JVD, no stridor  Lungs: No use of accessory muscles, no wheeze  Cardiovascular: RRR, heart sounds normal, no murmur or gallops, no peripheral edema  Musculoskeletal: No deformities, no cyanosis or clubbing  Neuro: alert, non focal  Skin: Warm, no lesions or rash    Assessment & Plan:  Chronic cough Much improved with the addition of Protonix.  She benefited even more when she increase it to twice a day.  We will continue this as well as her allergy regimen.  I will stop the  Brio as it may be irritating her throat to some degree.  Hopefully at some point as we go forward we will be able to decrease or stop the Protonix.  We will give her information about a reflux diet  Asthma No obstruction on her pulmonary function testing.  This was done on Breo but it is a reassuring finding.  We will try stopping the [facility] and see if she tolerates.  If so then we will concentrate on treating her upper airway and her cough.  If her symptoms return, breathing worsens or our suspicion for asthma increases then we can perform a methacholine  challenge to clarify.  Baltazar Apo, MD, PhD 12/27/2017, 4:26 PM [facility] [phone] or if no answer [phone]

## 2017-12-27 NOTE — Assessment & Plan Note (Signed)
Much improved with the addition of Protonix.  She benefited even more when she increase it to twice a day.  We will continue this as well as her allergy regimen.  I will stop the Brio as it may be irritating her throat to some degree.  Hopefully at some point as we go forward we will be able to decrease or stop the Protonix.  We will give her information about a reflux diet

## 2017-12-27 NOTE — Assessment & Plan Note (Signed)
No obstruction on her pulmonary function testing.  This was done on Breo but it is a reassuring finding.  We will try stopping the [facility] and see if she tolerates.  If so then we will concentrate on treating her upper airway and her cough.  If her symptoms return, breathing worsens or our suspicion for asthma increases then we can perform a methacholine challenge to clarify.

## 2018-01-06 ENCOUNTER — Encounter: Payer: Self-pay | Admitting: Emergency Medicine

## 2018-01-07 NOTE — Telephone Encounter (Signed)
Per 6.27.19 office visit with RB: Patient Instructions  Please continue Protonix 40 mg twice a day for now.  We will consider decreasing in the future Please continue Singulair, Xyzal, Flonase as you have been taking them. Stop Breo for now.  Please call our office if you notice any decline in your breathing while you are off of the medication. Keep your albuterol available to use 2 puffs if needed for shortness of breath, coughing spells, wheezing. If your breathing changes or if we need to investigate further then we can consider provocational pulmonary function testing Follow with Dr Lamonte Sakai in 4 months or sooner if you have any problems.   Per 7.7.19 e-mail from patient, she has begun having increased symptoms: Hey Dr. Lamonte Sakai,   Since stopping my Breo, I'm noticing now that it's starting to get hard for me to breathe. I feel like I'm not getting enough air when I breathe in and when I walk around and stuff my breathing gets labored. I need to know what you would like for me to do? I know you told me I needed to contact you if I started feeling this way.    Thank you,   [name]     Dr. Lamonte Sakai please advise, thank you.

## 2018-01-08 NOTE — Telephone Encounter (Signed)
Additional information received from patient, please see e-mail RB paged to assist with symptoms/recommendations  Dr Lamonte Sakai please advise, thank you

## 2018-01-19 ENCOUNTER — Encounter: Payer: Self-pay | Admitting: Emergency Medicine

## 2018-01-24 MED ORDER — FLUTICASONE FUROATE-VILANTEROL 200-25 MCG/INH IN AEPB: 1.0000 | INHALATION_SPRAY | Freq: Every day | RESPIRATORY_TRACT | 3 refills | Status: DC

## 2018-01-24 NOTE — Telephone Encounter (Signed)
Have her restart the breo once daily to see if she notices any change.  Make sure she is still on protonix. If so, and if she continues to have GERD sx then we may need to refer her for GI eval.

## 2018-01-24 NOTE — Telephone Encounter (Signed)
hello,  Yes I am still off the Breo. He told me to stop it. The last nurse said she was sending the messages to dr byrum and seeing what he wanted to do. But no one ever got back to me. It's worse when I go outside in the heat and walking around quite a bit. Or like when I go up and down the stairs at work. 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 I've been there since February and until I started having bad problems with acid reflux I was pretty good. I know he told me to let him know if I started to have difficulty.    RB please advise for this pt.  Thanks

## 2018-03-27 DIAGNOSIS — Z0000 Encounter for general adult medical examination without abnormal findings: Secondary | ICD-10-CM | POA: Diagnosis not present

## 2018-05-06 DIAGNOSIS — M542 Cervicalgia: Secondary | ICD-10-CM | POA: Diagnosis not present

## 2018-05-06 DIAGNOSIS — Z6832 Body mass index (BMI) 32.0-32.9, adult: Secondary | ICD-10-CM | POA: Diagnosis not present

## 2018-05-15 DIAGNOSIS — S161XXA Strain of muscle, fascia and tendon at neck level, initial encounter: Secondary | ICD-10-CM | POA: Diagnosis not present

## 2018-05-15 DIAGNOSIS — X58XXXA Exposure to other specified factors, initial encounter: Secondary | ICD-10-CM | POA: Diagnosis not present

## 2018-07-01 DIAGNOSIS — K529 Noninfective gastroenteritis and colitis, unspecified: Secondary | ICD-10-CM | POA: Diagnosis not present

## 2018-07-01 DIAGNOSIS — Z6832 Body mass index (BMI) 32.0-32.9, adult: Secondary | ICD-10-CM | POA: Diagnosis not present

## 2018-08-02 DIAGNOSIS — J209 Acute bronchitis, unspecified: Secondary | ICD-10-CM | POA: Diagnosis not present

## 2018-08-02 DIAGNOSIS — R05 Cough: Secondary | ICD-10-CM | POA: Diagnosis not present

## 2018-08-02 DIAGNOSIS — J40 Bronchitis, not specified as acute or chronic: Secondary | ICD-10-CM | POA: Diagnosis not present

## 2018-08-05 DIAGNOSIS — J40 Bronchitis, not specified as acute or chronic: Secondary | ICD-10-CM | POA: Diagnosis not present

## 2018-08-05 DIAGNOSIS — Z6831 Body mass index (BMI) 31.0-31.9, adult: Secondary | ICD-10-CM | POA: Diagnosis not present

## 2018-08-15 DIAGNOSIS — Z6832 Body mass index (BMI) 32.0-32.9, adult: Secondary | ICD-10-CM | POA: Diagnosis not present

## 2018-08-15 DIAGNOSIS — J45909 Unspecified asthma, uncomplicated: Secondary | ICD-10-CM | POA: Diagnosis not present

## 2018-08-19 ENCOUNTER — Ambulatory Visit (INDEPENDENT_AMBULATORY_CARE_PROVIDER_SITE_OTHER): Payer: 59 | Admitting: Emergency Medicine

## 2018-08-19 ENCOUNTER — Encounter: Payer: Self-pay | Admitting: *Deleted

## 2018-08-19 ENCOUNTER — Encounter: Payer: Self-pay | Admitting: Emergency Medicine

## 2018-08-19 DIAGNOSIS — J309 Allergic rhinitis, unspecified: Secondary | ICD-10-CM | POA: Insufficient documentation

## 2018-08-19 DIAGNOSIS — K219 Gastro-esophageal reflux disease without esophagitis: Secondary | ICD-10-CM | POA: Diagnosis not present

## 2018-08-19 DIAGNOSIS — J302 Other seasonal allergic rhinitis: Secondary | ICD-10-CM | POA: Insufficient documentation

## 2018-08-19 DIAGNOSIS — J45909 Unspecified asthma, uncomplicated: Secondary | ICD-10-CM | POA: Diagnosis not present

## 2018-08-19 DIAGNOSIS — J301 Allergic rhinitis due to pollen: Secondary | ICD-10-CM

## 2018-08-19 MED ORDER — PREDNISONE 10 MG PO TABS: ORAL_TABLET | ORAL | 0 refills | Status: DC

## 2018-08-19 MED ORDER — AZITHROMYCIN 250 MG PO TABS: ORAL_TABLET | ORAL | 0 refills | Status: AC

## 2018-08-19 NOTE — Assessment & Plan Note (Signed)
With an apparent acute exacerbation in the setting of viral upper respiratory infection, probably flu based on a family exposure.  She was treated with Medrol, has residual symptoms.  This could reflect just the latency of the disease with predicted timing of improvement.  Could consider undertreated flare.  She has no wheeze on exam today which is reassuring.  I will treat her with prednisone and antibiotics, continue her maintenance therapy as below.  Please take prednisone until completely gone. Please take azithromycin as directed until completely gone. Continue your Breo 1 inhalation daily.  Remember to rinse and gargle after you use this. Keep your albuterol available to use 2 puffs if needed for shortness of breath, chest tightness, wheezing. After this illness resolves would recommend that you get your flu shot Follow with Dr Lamonte Sakai in 6 months or sooner if you have any problems

## 2018-08-19 NOTE — Patient Instructions (Signed)
Please take prednisone until completely gone. Please take azithromycin as directed until completely gone. Continue your Breo 1 inhalation daily.  Remember to rinse and gargle after you use this. Keep your albuterol available to use 2 puffs if needed for shortness of breath, chest tightness, wheezing. After this illness resolves would recommend that you get your flu shot Continue Singulair, Flonase, Xyzal as you have been taking them. Follow with Dr Lamonte Sakai in 6 months or sooner if you have any problems

## 2018-08-19 NOTE — Assessment & Plan Note (Signed)
Continue Singulair, Flonase, Xyzal as you have been taking them.

## 2018-08-19 NOTE — Assessment & Plan Note (Signed)
She is benefiting from PPI twice daily, cough and GERD much less bothersome.

## 2018-08-19 NOTE — Progress Notes (Signed)
Subjective:    Patient ID: Nicole Rodriguez, female    DOB: [birth year], 28 y.o.   MRN: [id number]  HPI 28 year old never smoker with a history of asthma that was made clinically in 2016.  She has been dealing with chronic cough, chest tightness and heaviness. The heaviness is worst w activity and at night. The cough is worst at night, can produce thick mucous.  Happens with exertion, cold air, fumes, smoke. She has some allergic rhinitis. Rare reflux sx. Interestingly her sx are not reliably relieved by BD or by prednisone. pulmicort was added to breo recently.   She works at [employer], breathes in dusts, solvents.   ROV 12/27/17 --follow-up visit for never smoker with a history of clinical asthma.  She also has allergic rhinitis and chronic cough.  Symptoms include chest tightness and heaviness especially with exertion or with particular exposures.  She is been treated with bronchodilators, prednisone without much response.  At her initial visit I continued Breo and stopped Pulmicort.  We tried to treat potential contributors like GERD and allergic rhinitis.  She had pulmonary function testing done on 6/14 that showed normal spirometry without a bronchodilator response, borderline hyperinflated lung volumes and normal DLCO. Her cough is better > the protonix bid has been very helpful.    ROV 08/19/18 --28 year old woman, never smoker with chronic cough in the setting of GERD, allergic rhinitis.  She carries a history of clinical asthma although pulmonary function testing 12/2017 showed no evidence for obstruction.  At that time we tried stopping Breo to see how she would tolerate - was restarted in July.  Had been well until this illness. Cough had been better on the protonix.  She returns today reporting that she had a viral infection, diagnosed with flu 1/29. She was then seen in ED 2/6 for cough, dyspnea, purulent sputum. Took some hydromet.  Received medrol pack, no abx. Sputum now back to white. She is on  her xyzal, flonase, singulair - having some breakthrough rhinitis.   Review of Systems  Constitutional: Negative for fever and unexpected weight change.  HENT: Negative for congestion, dental problem, ear pain, nosebleeds, postnasal drip, rhinorrhea, sinus pressure, sneezing, sore throat and trouble swallowing.   Eyes: Negative for redness and itching.  Respiratory: Positive for cough, chest tightness, shortness of breath and wheezing.   Cardiovascular: Negative for palpitations and leg swelling.  Gastrointestinal: Negative for nausea and vomiting.  Genitourinary: Negative for dysuria.  Musculoskeletal: Negative for joint swelling.  Skin: Negative for rash.  Neurological: Negative for headaches.  Hematological: Does not bruise/bleed easily.  Psychiatric/Behavioral: Negative for dysphoric mood. The patient is not nervous/anxious.       Objective:   Physical Exam Vitals:   08/19/18 0917  BP: 114/72  Pulse: 90  SpO2: 98%  Weight: 186 lb 6.4 oz (84.6 kg)  Height: 5\' 2"  (1.575 m)   Gen: Pleasant, well-nourished, in no distress,  normal affect, not coughing today  ENT: No lesions,  mouth clear,  oropharynx clear, no postnasal drip  Neck: No JVD, no stridor  Lungs: No use of accessory muscles, no wheeze  Cardiovascular: RRR, heart sounds normal, no murmur or gallops, no peripheral edema  Musculoskeletal: No deformities, no cyanosis or clubbing  Neuro: alert, non focal  Skin: Warm, no lesions or rash    Assessment & Plan:  Asthma With an apparent acute exacerbation in the setting of viral upper respiratory infection, probably flu based on a family exposure.  She was treated with  Medrol, has residual symptoms.  This could reflect just the latency of the disease with predicted timing of improvement.  Could consider undertreated flare.  She has no wheeze on exam today which is reassuring.  I will treat her with prednisone and antibiotics, continue her maintenance therapy as  below.  Please take prednisone until completely gone. Please take azithromycin as directed until completely gone. Continue your Breo 1 inhalation daily.  Remember to rinse and gargle after you use this. Keep your albuterol available to use 2 puffs if needed for shortness of breath, chest tightness, wheezing. After this illness resolves would recommend that you get your flu shot Follow with Dr Lamonte Sakai in 6 months or sooner if you have any problems  Allergic rhinitis Continue Singulair, Flonase, Xyzal as you have been taking them.  GERD (gastroesophageal reflux disease) She is benefiting from PPI twice daily, cough and GERD much less bothersome.  Baltazar Apo, MD, PhD 08/19/2018, 9:40 AM [facility] [phone] or if no answer [phone]

## 2018-09-17 ENCOUNTER — Ambulatory Visit: Payer: 59 | Admitting: Podiatry

## 2018-10-03 NOTE — Telephone Encounter (Signed)
10/03/2018 1448  I am unsure what is being asked.  If the patient needs a letter simply stating that she has asthma I am fine signing that if we have been following her for that.  As far as for her symptoms she needs to isolate.  Her mother-in-law may also needs to isolate.  Patient should isolate for at least 7 days since onset of symptoms and 3 days without fever without Tylenol or other antipyretics.  Wyn Quaker, FNP

## 2018-10-03 NOTE — Telephone Encounter (Signed)
Aaron Edelman,  Please advise on the 3 emails from the patient. Patient stated that she has SOB and a cough mostly at night. Patient has no fever. Patient has last taken her temp 5 minutes ago. Patient denies no travel. Patients mother in law works at an apartment complex where two attendants have tested positive for Kiana. Patient was around mother in law all weekend. Patients mother in law has no current symptoms. Patients daughter has a cough and a temp of 100.2 who was also around the mother in law all weekend. Patient is worried she may have been infected. Patient would like a letter for her job since she is considered high risk with her dx of asthma. Thank you.

## 2018-10-04 NOTE — Telephone Encounter (Signed)
Aaron Edelman, this message was received this afternoon.  Ok. Can he put in the letter that I need to self isolate for 14 days? Because otherwise they will not honor it. They all know I have asthma.   Are you ok with this being placed in the my chart letter?

## 2018-10-04 NOTE — Telephone Encounter (Signed)
Addressed in other message received.

## 2018-10-09 NOTE — Telephone Encounter (Signed)
Good morning,  I still haven't heard back from the previous messages I sent last week. So I just wanted to follow up.   Thank you, [name]  Previous message for Aaron Edelman, NP- 10/03/18  Ok. Can he put in the letter that I need to self isolate for 14 days? Because otherwise they will not honor it. They all know I have asthma.   Are you ok with this being placed in the my chart letter?  Message routed to [place], NP (provider of the day)

## 2018-10-09 NOTE — Telephone Encounter (Signed)
I saw the email . But why is she self isolating for 14 days  Aaron Edelman gave the parameters in previous email from date of exposure on 4/2.  Did something change,. If she is sick with symptoms then will need televisit  If nto can write Brian's instructions previously from date of possible exposure .  I can sign

## 2018-10-10 ENCOUNTER — Encounter: Payer: Self-pay | Admitting: *Deleted

## 2018-10-10 ENCOUNTER — Other Ambulatory Visit: Payer: Self-pay

## 2018-10-10 ENCOUNTER — Telehealth: Payer: Self-pay

## 2018-10-10 ENCOUNTER — Ambulatory Visit (INDEPENDENT_AMBULATORY_CARE_PROVIDER_SITE_OTHER): Payer: 59 | Admitting: Pulmonary Disease

## 2018-10-10 ENCOUNTER — Encounter: Payer: Self-pay | Admitting: Pulmonary Disease

## 2018-10-10 VITALS — HR 110 | Temp 98.6°F

## 2018-10-10 DIAGNOSIS — J301 Allergic rhinitis due to pollen: Secondary | ICD-10-CM

## 2018-10-10 DIAGNOSIS — R079 Chest pain, unspecified: Secondary | ICD-10-CM | POA: Diagnosis not present

## 2018-10-10 DIAGNOSIS — J45909 Unspecified asthma, uncomplicated: Secondary | ICD-10-CM | POA: Diagnosis not present

## 2018-10-10 NOTE — Patient Instructions (Addendum)
You are reporting to me that you are having 6 hours of persistent chest pain I recommend that you present to an emergency room or urgent care immediately for further evaluation as well as with a EKG >>> Wear a mask >>> Although it is a low suspicion for your COVID-19 exposure please communicate with the emergency room that you are on a 14-day quarantine from a suspected exposure that you have remained asymptomatic as well as her mother-in-law  Continue Breo Ellipta 200 >>> Take 1 puff daily in the morning right when you wake up >>>Rinse your mouth out after use >>>This is a daily maintenance inhaler, NOT a rescue inhaler >>>Contact our office if you are having difficulties affording or obtaining this medication >>>It is important for you to be able to take this daily and not miss any doses  Only use your albuterol as a rescue medication to be used if you can't catch your breath by resting or doing a relaxed purse lip breathing pattern.  - The less you use it, the better it will work when you need it. - Ok to use up to 2 puffs  every 4 hours if you must but call for immediate appointment if use goes up over your usual need - Don't leave home without it !!  (think of it like the spare tire for your car)    Continue Xyzal daily Continue Singulair daily   Continue to be on quarantine for suspected COVID-19 exposure until 10/17/2018, work note generated for you to return to work at that point in time  Return if symptoms worsen or fail to improve, for Follow up with Dr. Lamonte Sakai.   Coronavirus (COVID-19) Are you at risk?  Are you at risk for the Coronavirus (COVID-19)?  To be considered HIGH RISK for Coronavirus (COVID-19), you have to meet the following criteria:  . Traveled to [place], [place], [place], [name] or [place]; or in the [place] to [place], [place], [place], or [place]; and have fever, cough, and shortness of breath within the last 2 weeks of travel OR . Been in close  contact with a person diagnosed with COVID-19 within the last 2 weeks and have fever, cough, and shortness of breath . IF YOU DO NOT MEET THESE CRITERIA, YOU ARE CONSIDERED LOW RISK FOR COVID-19.  What to do if you are HIGH RISK for COVID-19?  Marland Kitchen If you are having a medical emergency, call 911. . Seek medical care right away. Before you go to a doctor's office, urgent care or emergency department, call ahead and tell them about your recent travel, contact with someone diagnosed with COVID-19, and your symptoms. You should receive instructions from your physician's office regarding next steps of care.  . When you arrive at healthcare provider, tell the healthcare staff immediately you have returned from visiting [place], [name], [place], [place] or [place]; or traveled in the [place] to [place], [place], [place], or [place]; in the last two weeks or you have been in close contact with a person diagnosed with COVID-19 in the last 2 weeks.   . Tell the health care staff about your symptoms: fever, cough and shortness of breath. . After you have been seen by a medical provider, you will be either: o Tested for (COVID-19) and discharged home on quarantine except to seek medical care if symptoms worsen, and asked to  - Stay home and avoid contact with others until you get your results (4-5 days)  -  Avoid travel on public transportation if possible (such as bus, train, or airplane) or o Sent to the Emergency Department by EMS for evaluation, COVID-19 testing, and possible admission depending on your condition and test results.  What to do if you are LOW RISK for COVID-19?  Reduce your risk of any infection by using the same precautions used for avoiding the common cold or flu:  Marland Kitchen Wash your hands often with soap and warm water for at least 20 seconds.  If soap and water are not readily available, use an alcohol-based hand sanitizer with at least 60% alcohol.  . If coughing or sneezing, cover  your mouth and nose by coughing or sneezing into the elbow areas of your shirt or coat, into a tissue or into your sleeve (not your hands). . Avoid shaking hands with others and consider head nods or verbal greetings only. . Avoid touching your eyes, nose, or mouth with unwashed hands.  . Avoid close contact with people who are sick. . Avoid places or events with large numbers of people in one location, like concerts or sporting events. . Carefully consider travel plans you have or are making. . If you are planning any travel outside or inside the [place], visit the CDC's Travelers' Health webpage for the latest health notices. . If you have some symptoms but not all symptoms, continue to monitor at home and seek medical attention if your symptoms worsen. . If you are having a medical emergency, call 911.   [place] / e-Visit: [email or web address]         [facility]: [facility]: [phone]                   [facility]: [phone]           It is flu season:   >>> Best ways to protect herself from the flu: Receive the yearly flu vaccine, practice good hand hygiene washing with soap and also using hand sanitizer when available, eat a nutritious meals, get adequate rest, hydrate appropriately   Please contact the office if your symptoms worsen or you have concerns that you are not improving.   Thank you for choosing [facility] for your healthcare, and for allowing [place] to partner with you on your healthcare journey. I am thankful to be able to provide care to you today.   Wyn Quaker FNP-C   10/17/2018

## 2018-10-10 NOTE — Telephone Encounter (Signed)
Pt is calling back [phone]

## 2018-10-10 NOTE — Assessment & Plan Note (Signed)
Assessment: Managed in our office for asthma Reports adherence to Breo Ellipta 200 Known allergy symptoms Patient denies increased shortness of breath or wheezing No audible wheezing when telephonically assessing the patient today  Plan: Continue Breo Ellipta 200 Could consider steroids after further evaluation at emergency room or urgent care Follow-up in 3 months with Dr. Lamonte Sakai

## 2018-10-10 NOTE — Assessment & Plan Note (Signed)
Assessment: 6 hours of persistent chest pain starting today Patient reports she has significant pressure on her chest She denies acid reflux symptoms She denies wheezing Patient with known chronic anxiety and is currently very anxious regarding the COVID-19 pandemic Patient denies numbness Patient reports she is able to use all of her extremities Heart rate is tachycardic today at 110 per patient  Plan: Difficult etiology and I suspect the patient likely is having chest pain driven from her anxiety.  Unfortunately its hard to differentiate via telephone visit.  I discussed this with the patient.  She needs to present to an emergency room or urgent care for further evaluation.  She reports that she will present to the emergency room or urgent care for further evaluation of her chest pain

## 2018-10-10 NOTE — Assessment & Plan Note (Signed)
Plan: Continue Singulair Continue Xyzal Continue Flonase

## 2018-10-10 NOTE — Telephone Encounter (Signed)
LMTCB

## 2018-10-10 NOTE — Telephone Encounter (Signed)
After further reviewing all the emails back and forth, pt should be scheduled for a televisit.  Called and spoke with pt stating this to her. Pt expressed understanding. televisit has been scheduled for pt with Wyn Quaker, NP at 1:30. Nothing further needed.

## 2018-10-10 NOTE — Telephone Encounter (Signed)
Patient emailed the office with her BP reading  Nicole Rodriguez, Nicole Rodriguez to Lauraine Rinne, NP 10/10/18 2:39 PM  120/78 was my  Blood pressure

## 2018-10-10 NOTE — Progress Notes (Signed)
Virtual Visit via Telephone Note  I connected with Nicole Rodriguez on 10/10/18 at  1:30 PM EDT by telephone and verified that I am speaking with the correct person using two identifiers.   I discussed the limitations, risks, security and privacy concerns of performing an evaluation and management service by telephone and the availability of in person appointments. I also discussed with the patient that there may be a patient responsible charge related to this service. The patient expressed understanding and agreed to proceed.   History of Present Illness: 28 year old never smoker followed in our office for Asthma Maintenance: Breo 200 Pt of Dr. Lamonte Sakai   Patient consented to consult via telephone: Yes People present and their role in pt care: Pt   Chief complaint: Asthma / Covid19 anxiety   28 year old female never smoker followed in our office for asthma.  She is followed by Dr. Lamonte Sakai.  There have been multiple my chart messages back and forth between the patient in our office over the past week regarding I suspected COVID-19 exposure.  Patient has had significant anxiety regarding this.  Patient reports that her mother-in-law works at an apartment complex where 2 flight attendants have tested positive for COVID-19.  Her mother-in-law has had exposure to this attendants.  Her mother-in-law also watched the patient's child at this time.  The patient's child had a low-grade fever.  Her mother-in-law as well as her have remained asymptomatic.  At the time of this exposure was 10/03/2018 and patient was recommended to start a quarantine.  Samule Dry will be lifted on 10/17/2018 if patient remains asymptomatic.  Patient reports that they have been following the quarantine.  The only person to leave the house is her wife to go out and purchase groceries from [employer] or the grocery store.  Patient reports that her and her wife have been practicing adequate hand hygiene and practicing social distancing.   Patient reports that she was outside longer than usual yesterday and is reporting minor allergy symptoms such as itchy eyes.  She reports that she is adherent to her Singulair as well as her Xyzal.  Patient denies increased shortness of breath or wheezing.  She had to use her rescue inhaler 1 time yesterday.  She denies having to use it today.  Unfortunately, patient reports that around 8 AM today she started develop persistent chest pain.  This is been ongoing since 8 AM today.  Has improved slightly but continues to persist.  Patient reports she feels that she has significant pressure on her chest.  Patient reports that she is tachycardic today heart rate is 110.  She denies increased shortness of breath.  She denies arm numbness.  She denies vision changes.   Observations/Objective:  10/10/18 - HR - 110 10/10/18 - Temp -  98.6 10/10/18-SPO2-96% (room air)  Patient unable to take blood pressure as they cannot find a blood pressure machine at home  No results found for: NITRICOXIDE  Assessment and Plan:  Asthma Assessment: Managed in our office for asthma Reports adherence to Breo Ellipta 200 Known allergy symptoms Patient denies increased shortness of breath or wheezing No audible wheezing when telephonically assessing the patient today  Plan: Continue Breo Ellipta 200 Could consider steroids after further evaluation at emergency room or urgent care Follow-up in 3 months with Dr. Lamonte Sakai    Chest pain Assessment: 6 hours of persistent chest pain starting today Patient reports she has significant pressure on her chest She denies acid reflux symptoms She denies  wheezing Patient with known chronic anxiety and is currently very anxious regarding the COVID-19 pandemic Patient denies numbness Patient reports she is able to use all of her extremities Heart rate is tachycardic today at 110 per patient  Plan: Difficult etiology and I suspect the patient likely is having chest pain driven  from her anxiety.  Unfortunately its hard to differentiate via telephone visit.  I discussed this with the patient.  She needs to present to an emergency room or urgent care for further evaluation.  She reports that she will present to the emergency room or urgent care for further evaluation of her chest pain  Allergic rhinitis Plan: Continue Singulair Continue Xyzal Continue Flonase   Follow Up Instructions:  Return in about 3 months (around 01/09/2019), or if symptoms worsen or fail to improve, for Follow up with Dr. Lamonte Sakai.    I discussed the assessment and treatment plan with the patient. The patient was provided an opportunity to ask questions and all were answered. The patient agreed with the plan and demonstrated an understanding of the instructions.   The patient was advised to call back or seek an in-person evaluation if the symptoms worsen or if the condition fails to improve as anticipated.  I provided 30 minutes of non-face-to-face time during this encounter.   Lauraine Rinne, NP

## 2018-10-14 ENCOUNTER — Encounter: Payer: Self-pay | Admitting: Podiatry

## 2018-10-14 ENCOUNTER — Ambulatory Visit (INDEPENDENT_AMBULATORY_CARE_PROVIDER_SITE_OTHER): Payer: 59

## 2018-10-14 ENCOUNTER — Other Ambulatory Visit: Payer: Self-pay

## 2018-10-14 ENCOUNTER — Ambulatory Visit: Payer: 59 | Admitting: Podiatry

## 2018-10-14 VITALS — Temp 97.7°F

## 2018-10-14 DIAGNOSIS — M722 Plantar fascial fibromatosis: Secondary | ICD-10-CM

## 2018-10-14 MED ORDER — TRIAMCINOLONE ACETONIDE 10 MG/ML IJ SUSP: 10.0000 mg | Freq: Once | INTRAMUSCULAR | Status: DC

## 2018-10-14 MED ORDER — CICLOPIROX 8 % EX SOLN: Freq: Every day | CUTANEOUS | 4 refills | Status: DC

## 2018-10-14 NOTE — Patient Instructions (Signed)

## 2018-10-15 ENCOUNTER — Encounter: Payer: Self-pay | Admitting: Podiatry

## 2018-10-15 ENCOUNTER — Encounter: Payer: Self-pay | Admitting: *Deleted

## 2018-10-15 DIAGNOSIS — M722 Plantar fascial fibromatosis: Secondary | ICD-10-CM | POA: Insufficient documentation

## 2018-10-15 HISTORY — DX: Plantar fascial fibromatosis: M72.2

## 2018-10-15 NOTE — Progress Notes (Addendum)
Subjective:   Patient ID: Nicole Rodriguez, female   DOB: 28 y.o.   MRN: [id number]   HPI 28 year old female presents the office today for concerns of bilateral heel pain with the right side worse than the left.  This is been ongoing about 6 months.  She denies any recent injury or trauma to the area.  Gets worse after rest/sitting.  Denies any numbness or tingling.  The pain does not wake her up at night.  She has been taking Tylenol, ibuprofen, without any significant im help provement. She states her nails have started to become discolored. She has no other concerns today.   Review of Systems  All other systems reviewed and are negative.  Past Medical History:  Diagnosis Date  . Asthma     Past Surgical History:  Procedure Laterality Date  . APPENDECTOMY    . BREAST LUMPECTOMY    . FOOT SURGERY       Current Outpatient Medications:  .  albuterol (PROVENTIL) (2.5 MG/3ML) 0.083% nebulizer solution, Take 2.5 mg by nebulization every 6 (six) hours as needed for wheezing or shortness of breath., Disp: , Rfl:  .  Albuterol Sulfate (PROAIR RESPICLICK) 956 (90 Base) MCG/ACT AEPB, Inhale 1-2 puffs into the lungs every 6 (six) hours as needed (shortness of breath)., Disp: , Rfl:  .  budesonide (PULMICORT) 0.25 MG/2ML nebulizer solution, VVN BID, Disp: , Rfl:  .  ciclopirox (PENLAC) 8 % solution, Apply topically at bedtime. Apply over nail and surrounding skin. Apply daily over previous coat. After seven (7) days, may remove with alcohol and continue cycle., Disp: 6.6 mL, Rfl: 4 .  cyclobenzaprine (FLEXERIL) 10 MG tablet, Take 10 mg by mouth 2 (two) times daily., Disp: , Rfl:  .  fluticasone (FLONASE) 50 MCG/ACT nasal spray, Place 1 spray into both nostrils daily. , Disp: , Rfl:  .  fluticasone furoate-vilanterol (BREO ELLIPTA) 200-25 MCG/INH AEPB, Inhale 1 puff into the lungs daily., Disp: 60 each, Rfl: 3 .  HYDROMET 5-1.5 MG/5ML syrup, TK 5 MLS PO TID PRN, Disp: , Rfl:  .  levocetirizine  (XYZAL) 5 MG tablet, Take 5 mg by mouth every evening., Disp: , Rfl:  .  LORazepam (ATIVAN) 0.5 MG tablet, Take 0.5 mg by mouth every 8 (eight) hours., Disp: , Rfl:  .  methylPREDNISolone (MEDROL DOSEPAK) 4 MG TBPK tablet, FPD, Disp: , Rfl:  .  montelukast (SINGULAIR) 10 MG tablet, Take 10 mg by mouth at bedtime., Disp: , Rfl:  .  ondansetron (ZOFRAN) 4 MG tablet, TK 1 T PO QID PRN, Disp: , Rfl:  .  pantoprazole (PROTONIX) 40 MG tablet, Take 1 tablet (40 mg total) by mouth 2 (two) times daily., Disp: 60 tablet, Rfl: 5 .  predniSONE (DELTASONE) 10 MG tablet, Take 3 tablets for 3 days, 2 tablets for 3 days, 1 tablet for 3 days, Disp: 18 tablet, Rfl: 0 .  traMADol (ULTRAM) 50 MG tablet, TK 1 T PO BID, Disp: , Rfl:   Current Facility-Administered Medications:  .  triamcinolone acetonide (KENALOG) 10 MG/ML injection 10 mg, 10 mg, Other, Once, Trula Slade, DPM  Allergies  Allergen Reactions  . Cefoxitin Itching and Rash  . Amoxicillin Hives  . Asa [Aspirin] Hives  . Sulfa Antibiotics Hives         Objective:  Physical Exam  General: AAO x3, NAD  Dermatological: The left hallux toenail in particular has yellow and brown discoloration and mild incurvation. There is no pain, drainage or signs  of infection. No open lesions  Vascular: Dorsalis Pedis artery and Posterior Tibial artery pedal pulses are 2/4 bilateral with immedate capillary fill time. Pedal hair growth present. No varicosities and no lower extremity edema present bilateral. There is no pain with calf compression, swelling, warmth, erythema.   Neruologic: Grossly intact via light touch bilateral. Protective threshold with Semmes Wienstein monofilament intact to all pedal sites bilateral. Negative tinel sign.   Musculoskeletal: Tenderness to palpation along the plantar medial tubercle of the calcaneus at the insertion of plantar fascia on the right >> left foot. There is no pain along the course of the plantar fascia within  the arch of the foot. Plantar fascia appears to be intact. There is no pain with lateral compression of the calcaneus or pain with vibratory sensation. There is no pain along the course or insertion of the achilles tendon. No other areas of tenderness to bilateral lower extremities.  Muscular strength 5/5 in all groups tested bilateral.  Gait: Unassisted, Nonantalgic.       Assessment:   Bilateral heel pain, plantar fasciitis right side worse than left    Plan:  -Treatment options discussed including all alternatives, risks, and complications -Etiology of symptoms were discussed -X-rays were obtained and reviewed with the patient. No evidence of acute fracture/stres fracture -Steroid injection right foot. See procedure note below.  -Bilateral plantar fascia brace b/l -Stretching/icing daily -Discussed getting a night splint.  -Shoe modifications/possible inserts. -Penlac  Procedure: Injection Tendon/Ligament Discussed alternatives, risks, complications and verbal consent was obtained.  Location: RIGHT plantar fascia at the glabrous junction; medial approach. Skin Prep: Alcohol  Injectate: 0.5cc 0.5% marcaine plain, 0.5 cc 2% lidocaine plain and, 1 cc kenalog 10. Disposition: Patient tolerated procedure well. Injection site dressed with a band-aid.  Post-injection care was discussed and return precautions discussed.   Return in about 4 weeks (around 11/11/2018).  Trula Slade DPM

## 2018-10-17 ENCOUNTER — Encounter: Payer: Self-pay | Admitting: Podiatry

## 2018-10-18 ENCOUNTER — Other Ambulatory Visit: Payer: Self-pay | Admitting: Podiatry

## 2018-10-18 MED ORDER — METHYLPREDNISOLONE 4 MG PO TBPK: ORAL_TABLET | ORAL | 0 refills | Status: DC

## 2018-10-22 ENCOUNTER — Encounter: Payer: Self-pay | Admitting: Podiatry

## 2018-10-28 ENCOUNTER — Encounter: Payer: Self-pay | Admitting: Podiatry

## 2018-10-30 ENCOUNTER — Encounter: Payer: Self-pay | Admitting: *Deleted

## 2018-11-05 ENCOUNTER — Ambulatory Visit: Payer: 59 | Admitting: Podiatry

## 2018-11-05 ENCOUNTER — Encounter

## 2018-11-06 ENCOUNTER — Telehealth: Payer: Self-pay | Admitting: *Deleted

## 2018-11-06 NOTE — Telephone Encounter (Signed)
Halina Andreas request office notes to be faxed to [phone] and clarification of the work status.

## 2018-11-07 ENCOUNTER — Encounter: Payer: Self-pay | Admitting: Podiatry

## 2018-11-07 ENCOUNTER — Ambulatory Visit: Payer: 59 | Admitting: Podiatry

## 2018-11-07 ENCOUNTER — Other Ambulatory Visit: Payer: Self-pay

## 2018-11-07 VITALS — Temp 97.3°F

## 2018-11-07 DIAGNOSIS — M722 Plantar fascial fibromatosis: Secondary | ICD-10-CM

## 2018-11-07 DIAGNOSIS — B351 Tinea unguium: Secondary | ICD-10-CM

## 2018-11-07 DIAGNOSIS — M79676 Pain in unspecified toe(s): Secondary | ICD-10-CM

## 2018-11-07 MED ORDER — MELOXICAM 15 MG PO TABS: 15.0000 mg | ORAL_TABLET | Freq: Every day | ORAL | 2 refills | Status: DC

## 2018-11-07 MED ORDER — TRIAMCINOLONE ACETONIDE 10 MG/ML IJ SUSP
10.0000 mg | Freq: Once | INTRAMUSCULAR | Status: AC
  Administered 2018-11-07: 10 mg

## 2018-11-08 NOTE — Progress Notes (Signed)
Subjective: Nicole Rodriguez presents to the office today for follow-up evaluation of heel pain.  She states that she still gets pain to the arch of the right foot as well as the bottom of the heel.  Injection helped some.  She still has been stretching as well as a plantar fascial brace.  She also purchased a night splint.  The left foot is having no pain.  She is also been using the Penlac and this is been helpful with the toenail. She has no new concerns.  Denies any systemic complaints such as fevers, chills, nausea, vomiting. No acute changes since last appointment, and no other complaints at this time.   Objective: AAO x3, NAD DP/PT pulses palpable bilaterally, CRT less than 3 seconds There is tenderness palpation on the plantar medial tubercle of the calcaneus at insertion of plantar fascia on the right side as well as the arch of the foot.  No pain in the left foot.  Is no pain with lateral compression of calcaneus.  Flexor, extensor tendons appear intact.  Achilles tendon appears to be intact.  The left hallux toenail appears to be improved and there is no discoloration of the nail. No signs of infection.  No open lesions or pre-ulcerative lesions.  No pain with calf compression, swelling, warmth, erythema  Assessment: Right foot pain, plantar fasciitis, onychomycosis  Plan: -All treatment options discussed with the patient including all alternatives, risks, complications.  -Today steroid injections of the right foot.  See procedure note below.  Plantar fascial strapping was also applied.  Continue with stretching, ice exercises daily.  Prescription meloxicam discussed side effects the medication.  Continue with supportive shoes and orthotics as well. -Continue Penlac. -She is currently not returned to work due to the discomfort when wearing shoes.  Clinically with return to work in the next week. -Patient encouraged to call the office with any questions, concerns, change in symptoms.    Procedure: Injection Tendon/Ligament Discussed alternatives, risks, complications and verbal consent was obtained.  Location: RIGHT plantar fascia at the glabrous junction; medial approach. Skin Prep: Alcohol  Injectate: 0.5cc 0.5% marcaine plain, 0.5 cc 2% lidocaine plain and, 1 cc kenalog 10. Disposition: Patient tolerated procedure well. Injection site dressed with a band-aid.  Post-injection care was discussed and return precautions discussed.    Trula Slade DPM

## 2018-11-11 ENCOUNTER — Ambulatory Visit: Payer: 59 | Admitting: Podiatry

## 2018-11-17 ENCOUNTER — Encounter: Payer: Self-pay | Admitting: Podiatry

## 2018-11-18 ENCOUNTER — Ambulatory Visit: Payer: Self-pay

## 2018-11-18 ENCOUNTER — Other Ambulatory Visit: Payer: Self-pay

## 2018-11-18 DIAGNOSIS — M79676 Pain in unspecified toe(s): Secondary | ICD-10-CM

## 2018-11-18 DIAGNOSIS — M722 Plantar fascial fibromatosis: Secondary | ICD-10-CM

## 2018-11-18 NOTE — Progress Notes (Signed)
Plantar fascial taping, dispensed pro-steps. Patient is to keep her current follow up with Dr Jacqualyn Posey

## 2018-11-19 ENCOUNTER — Encounter: Payer: Self-pay | Admitting: *Deleted

## 2018-11-19 ENCOUNTER — Encounter: Payer: Self-pay | Admitting: Podiatry

## 2018-11-19 NOTE — Telephone Encounter (Signed)
Faxed return to work note for 11/26/2018 to employer.

## 2018-11-27 ENCOUNTER — Encounter: Payer: Self-pay | Admitting: Podiatry

## 2018-12-05 ENCOUNTER — Ambulatory Visit: Payer: 59 | Admitting: Podiatry

## 2018-12-24 ENCOUNTER — Ambulatory Visit: Payer: 59 | Admitting: Podiatry

## 2018-12-24 NOTE — Telephone Encounter (Signed)
Please route to RB.   Aaron Edelman

## 2018-12-24 NOTE — Telephone Encounter (Signed)
Aaron Edelman can we refill Zyzal? We didn't originally prescribe it. Please advise.

## 2018-12-26 MED ORDER — LEVOCETIRIZINE DIHYDROCHLORIDE 5 MG PO TABS: 5.0000 mg | ORAL_TABLET | Freq: Every evening | ORAL | 5 refills | Status: DC

## 2018-12-26 NOTE — Telephone Encounter (Signed)
Previous mychart message received by pt requesting to have her Xyzal refilled. Dr. Lamonte Sakai, please advise if you are okay with [place] refilling med for pt. Thank you!

## 2018-12-26 NOTE — Telephone Encounter (Signed)
Yes, Ok to refill 

## 2018-12-30 NOTE — Telephone Encounter (Signed)
Yes, please refill xyzal if it hasn't already been done. Thanks

## 2019-01-09 ENCOUNTER — Telehealth: Payer: 59 | Admitting: Physician Assistant

## 2019-01-09 ENCOUNTER — Encounter (INDEPENDENT_AMBULATORY_CARE_PROVIDER_SITE_OTHER): Payer: Self-pay

## 2019-01-09 DIAGNOSIS — Z20822 Contact with and (suspected) exposure to covid-19: Secondary | ICD-10-CM

## 2019-01-09 NOTE — Progress Notes (Signed)
I have spent 5 minutes in review of e-visit questionnaire, review and updating patient chart, medical decision making and response to patient.   Hommer Cunliffe Cody Donyale Berthold, PA-C    

## 2019-01-09 NOTE — Progress Notes (Signed)
E-Visit for [employer] Virus Screening   Your current symptoms could be consistent with the coronavirus (mild case) or potentially another run of the mill virus. Giving recent travel, especially to the beach where risk is higher for exposure due to number of people, I recommend that you be tested.  Call your health care provider or local health department to request and arrange formal testing. Many health care providers can now test patients at their office but not all are.  Please quarantine yourself while awaiting your test results.  [place] [phone], [place], [place] [phone] or visit [email or web address]  and You have been enrolled in [place] for COVID-19.  Daily you will receive a questionnaire within the [facility] website. Our COVID-19 response team will be monitoring your responses daily.    COVID-19 is a respiratory illness with symptoms that are similar to the flu. Symptoms are typically mild to moderate, but there have been cases of severe illness and death due to the virus. The following symptoms may appear 2-14 days after exposure: . Fever . Cough . Shortness of breath or difficulty breathing . Chills . Repeated shaking with chills . Muscle pain . Headache . Sore throat . New loss of taste or smell . Fatigue . Congestion or runny nose . Nausea or vomiting . Diarrhea  It is vitally important that if you feel that you have an infection such as this virus or any other virus that you stay home and away from places where you may spread it to others.  You should self-quarantine for 14 days if you have symptoms that could potentially be coronavirus or have been in close contact a with a person diagnosed with COVID-19 within the last 2 weeks. You should avoid contact with people age 5 and older.   You should wear a mask or  cloth face covering over your nose and mouth if you must be around other people or animals, including pets (even at home). Try to stay at least 6 feet away from other people. This will protect the people around you.  You may also take acetaminophen (Tylenol) as needed for fever.   Reduce your risk of any infection by using the same precautions used for avoiding the common cold or flu:  Marland Kitchen Wash your hands often with soap and warm water for at least 20 seconds.  If soap and water are not readily available, use an alcohol-based hand sanitizer with at least 60% alcohol.  . If coughing or sneezing, cover your mouth and nose by coughing or sneezing into the elbow areas of your shirt or coat, into a tissue or into your sleeve (not your hands). . Avoid shaking hands with others and consider head nods or verbal greetings only. . Avoid touching your eyes, nose, or mouth with unwashed hands.  . Avoid close contact with people who are sick. . Avoid places or events with large numbers of people in one location, like concerts or sporting events. . Carefully consider travel plans you have or are making. . If you are planning any travel outside or inside the [place], visit the CDC's Travelers' Health webpage for the latest health notices. . If you have some symptoms but not all symptoms, continue to monitor at home and seek medical attention if your symptoms worsen. . If you are having a medical emergency, call 911.  HOME CARE . Only take medications as instructed by your medical team. . Drink plenty of  fluids and get plenty of rest. . A steam or ultrasonic humidifier can help if you have congestion.   GET HELP RIGHT AWAY IF YOU HAVE EMERGENCY WARNING SIGNS** FOR COVID-19. If you or someone is showing any of these signs seek emergency medical care immediately. Call 911 or proceed to your closest emergency facility if: . You develop worsening high fever. . Trouble breathing . Bluish lips or face . Persistent pain  or pressure in the chest . New confusion . Inability to wake or stay awake . You cough up blood. . Your symptoms become more severe  **This list is not all possible symptoms. Contact your medical provider for any symptoms that are sever or concerning to you.   MAKE SURE YOU   Understand these instructions.  Will watch your condition.  Will get help right away if you are not doing well or get worse.  Your e-visit answers were reviewed by a board certified advanced clinical practitioner to complete your personal care plan.  Depending on the condition, your plan could have included both over the counter or prescription medications.  If there is a problem please reply once you have received a response from your provider.  Your safety is important to [place].  If you have drug allergies check your prescription carefully.    You can use MyChart to ask questions about today's visit, request a non-urgent call back, or ask for a work or school excuse for 24 hours related to this e-Visit. If it has been greater than 24 hours you will need to follow up with your provider, or enter a new e-Visit to address those concerns. You will get an e-mail in the next two days asking about your experience.  I hope that your e-visit has been valuable and will speed your recovery. Thank you for using e-visits.

## 2019-01-10 ENCOUNTER — Other Ambulatory Visit: Payer: Self-pay

## 2019-01-10 ENCOUNTER — Encounter (INDEPENDENT_AMBULATORY_CARE_PROVIDER_SITE_OTHER): Payer: Self-pay

## 2019-01-10 DIAGNOSIS — Z20822 Contact with and (suspected) exposure to covid-19: Secondary | ICD-10-CM

## 2019-01-10 NOTE — Telephone Encounter (Signed)
So I put in the request to the [facility] for testing. Patient sent email back stating she went to a non-referral site at [facility] and got tested today , does she still need to get scheduled with the [facility]?  Beth please advise

## 2019-01-10 NOTE — Telephone Encounter (Signed)
Patient's work states she should be tested for [employer].  Her symptoms are "I do have a little cough and a little bit of a sore throat. I have had a headache since Monday that hasn't really went away. I just overall don't feel good either. Shortness of breath is even when sitting down. I feel like I have something sitting on my chest. I just got back from [facility] on Sunday. I didn't really go out other than to the grocery stores and I did wear a mask. I did go in the store at [facility] to get ice twice and had forgotten my mask. I did sanitize my hands when I came out. Sorry I didn't give enough information before.   Nicole Rodriguez "   Beth please advise

## 2019-01-10 NOTE — Telephone Encounter (Signed)
Yes please order covid testing and advise she remain quarantined until results back. Can give her a note for work if she would like

## 2019-01-10 NOTE — Telephone Encounter (Signed)
As long as it was done by a registered nurse or CMA and patient did not swab themself I am find with them getting it done at [facility]. Make sure results are sent her for follow-up.

## 2019-01-10 NOTE — Telephone Encounter (Signed)
Sending patient to get tested her Geraldo Pitter NP.

## 2019-01-11 ENCOUNTER — Encounter (HOSPITAL_COMMUNITY): Payer: Self-pay | Admitting: Emergency Medicine

## 2019-01-11 ENCOUNTER — Encounter (INDEPENDENT_AMBULATORY_CARE_PROVIDER_SITE_OTHER): Payer: Self-pay

## 2019-01-11 ENCOUNTER — Emergency Department (HOSPITAL_COMMUNITY): Payer: 59

## 2019-01-11 ENCOUNTER — Telehealth: Payer: Self-pay

## 2019-01-11 ENCOUNTER — Other Ambulatory Visit: Payer: Self-pay

## 2019-01-11 ENCOUNTER — Emergency Department (HOSPITAL_COMMUNITY)
Admission: EM | Admit: 2019-01-11 | Discharge: 2019-01-11 | Disposition: A | Discharge status: Home / Self Care | Payer: 59 | Attending: Emergency Medicine | Admitting: Emergency Medicine

## 2019-01-11 DIAGNOSIS — R05 Cough: Secondary | ICD-10-CM | POA: Diagnosis present

## 2019-01-11 DIAGNOSIS — Z20822 Contact with and (suspected) exposure to covid-19: Secondary | ICD-10-CM

## 2019-01-11 DIAGNOSIS — B9789 Other viral agents as the cause of diseases classified elsewhere: Secondary | ICD-10-CM

## 2019-01-11 DIAGNOSIS — J45909 Unspecified asthma, uncomplicated: Secondary | ICD-10-CM | POA: Insufficient documentation

## 2019-01-11 DIAGNOSIS — Z79899 Other long term (current) drug therapy: Secondary | ICD-10-CM | POA: Diagnosis not present

## 2019-01-11 DIAGNOSIS — J988 Other specified respiratory disorders: Secondary | ICD-10-CM | POA: Diagnosis not present

## 2019-01-11 LAB — COMPREHENSIVE METABOLIC PANEL
ALT: 17 U/L (ref 0–44)
AST: 14 U/L — ABNORMAL LOW (ref 15–41)
Albumin: 4.2 g/dL (ref 3.5–5.0)
Alkaline Phosphatase: 60 U/L (ref 38–126)
Anion gap: 7 (ref 5–15)
BUN: 9 mg/dL (ref 6–20)
CO2: 25 mmol/L (ref 22–32)
Calcium: 8.8 mg/dL — ABNORMAL LOW (ref 8.9–10.3)
Chloride: 104 mmol/L (ref 98–111)
Creatinine, Ser: 0.67 mg/dL (ref 0.44–1.00)
GFR calc Af Amer: >60 mL/min (ref >60)
GFR calc non Af Amer: >60 mL/min (ref >60)
Glucose, Bld: 111 mg/dL — ABNORMAL HIGH (ref 70–99)
Potassium: 3.8 mmol/L (ref 3.5–5.1)
Sodium: 136 mmol/L (ref 135–145)
Total Bilirubin: 0.5 mg/dL (ref 0.3–1.2)
Total Protein: 7.8 g/dL (ref 6.5–8.1)

## 2019-01-11 LAB — CBC WITH DIFFERENTIAL/PLATELET
Abs Immature Granulocytes: 0.03 10*3/uL (ref 0.00–0.07)
Basophils Absolute: 0 10*3/uL (ref 0.0–0.1)
Basophils Relative: 0 %
Eosinophils Absolute: 0.1 10*3/uL (ref 0.0–0.5)
Eosinophils Relative: 1 %
HCT: 43.3 % (ref 36.0–46.0)
Hemoglobin: 14.4 g/dL (ref 12.0–15.0)
Immature Granulocytes: 0 %
Lymphocytes Relative: 28 %
Lymphs Abs: 3.2 10*3/uL (ref 0.7–4.0)
MCH: 28.8 pg (ref 26.0–34.0)
MCHC: 33.3 g/dL (ref 30.0–36.0)
MCV: 86.6 fL (ref 80.0–100.0)
Monocytes Absolute: 0.7 10*3/uL (ref 0.1–1.0)
Monocytes Relative: 6 %
Neutro Abs: 7.3 10*3/uL (ref 1.7–7.7)
Neutrophils Relative %: 65 %
Platelets: 352 10*3/uL (ref 150–400)
RBC: 5 MIL/uL (ref 3.87–5.11)
RDW: 12.4 % (ref 11.5–15.5)
WBC: 11.3 10*3/uL — ABNORMAL HIGH (ref 4.0–10.5)
nRBC: 0 % (ref 0.0–0.2)

## 2019-01-11 MED ORDER — SODIUM CHLORIDE 0.9 % IV BOLUS
1000.0000 mL | Freq: Once | INTRAVENOUS | Status: AC
  Administered 2019-01-11: 1000 mL via INTRAVENOUS

## 2019-01-11 NOTE — Telephone Encounter (Signed)
Pt c/o SOB at rest and has underlying asthma. Pt advised to seek medical care in the ED. Pt also c/o new weakness. Pt advised that if she has worsening weakness with inability to stand or if patient has to hold on to something to get balance, advise to call 911 and seek treatment in ED. Pt verbalized understanding.

## 2019-01-11 NOTE — Discharge Instructions (Signed)
For now you should self quarantine until you get the test results of your coronavirus testing back.  If you develop vomiting, severe trouble breathing, or any other new/concerning symptoms then return to the ER for evaluation.

## 2019-01-11 NOTE — ED Provider Notes (Signed)
[facility] EMERGENCY DEPARTMENT Provider Note   CSN: [id number] Arrival date & time: 01/11/19  1710    History   Chief Complaint Chief Complaint  Patient presents with  . Cough    HPI Nicole Rodriguez is a 28 y.o. female.     HPI  28 year old female presents with cough and shortness of breath.  She was recently at [facility] and came back on 7/5.  Next day or so she developed a headache and has had progressive symptoms including continuing to have the headache, nonproductive cough, sore throat, and generalized fatigue.  She is short of breath.  The shortness of breath worsens when she does an activity but also is present at rest.  She is tried her nebulizers with no significant relief.  No significant chest pain though occasionally when she coughs she gets chest pain.  No leg swelling or history of DVT/PE.  She is not on birth control.  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 and because she reported her shortness of breath was worse today they recommended she come to the ER.  She was tested for the novel coronavirus yesterday but does not know the results yet.  Past Medical History:  Diagnosis Date  . Asthma     Patient Active Problem List   Diagnosis Date Noted  . Plantar fasciitis 10/15/2018  . Chest pain 10/10/2018  . Allergic rhinitis 08/19/2018  . GERD (gastroesophageal reflux disease) 08/19/2018  . Chronic cough 11/29/2017  . Asthma 11/29/2017  . Breast mass, right 03/18/2014  . Numbness and tingling in both hands 01/26/2014  . Neck pain 10/31/2010    Past Surgical History:  Procedure Laterality Date  . APPENDECTOMY    . BREAST LUMPECTOMY    . FOOT SURGERY       OB History    Gravida  0   Para  0   Term  0   Preterm  0   AB  0   Living  0     SAB  0   TAB  0   Ectopic  0   Multiple  0   Live Births  0            Home Medications    Prior to Admission medications   Medication Sig Start Date End Date Taking? Authorizing  Provider  albuterol (PROVENTIL) (2.5 MG/3ML) 0.083% nebulizer solution Take 2.5 mg by nebulization every 6 (six) hours as needed for wheezing or shortness of breath.    [provider]  Albuterol Sulfate (PROAIR RESPICLICK) 462 (90 Base) MCG/ACT AEPB Inhale 1-2 puffs into the lungs every 6 (six) hours as needed (shortness of breath).    [provider]  budesonide (PULMICORT) 0.25 MG/2ML nebulizer solution VVN BID 08/07/18   [provider]  ciclopirox (PENLAC) 8 % solution Apply topically at bedtime. Apply over nail and surrounding skin. Apply daily over previous coat. After seven (7) days, may remove with alcohol and continue cycle. 10/14/18   Trula Slade, DPM  cyclobenzaprine (FLEXERIL) 10 MG tablet Take 10 mg by mouth 2 (two) times daily. 05/06/18   [provider]  fluticasone (FLONASE) 50 MCG/ACT nasal spray Place 1 spray into both nostrils daily.     [provider]  fluticasone furoate-vilanterol (BREO ELLIPTA) 200-25 MCG/INH AEPB Inhale 1 puff into the lungs daily. 01/24/18   Collene Gobble, MD  HYDROMET 5-1.5 MG/5ML syrup TK 5 MLS PO TID PRN 08/05/18   [provider]  levocetirizine (XYZAL) 5 MG tablet Take 1 tablet (5 mg total) by mouth every evening. 12/26/18   Byrum, Rose Fillers, MD  LORazepam (ATIVAN) 0.5 MG tablet Take 0.5 mg by mouth every 8 (eight) hours.    [provider]  meloxicam (MOBIC) 15 MG tablet Take 1 tablet (15 mg total) by mouth daily. 11/07/18 11/07/19  Trula Slade, DPM  methylPREDNISolone (MEDROL DOSEPAK) 4 MG TBPK tablet Take as directed 10/18/18   Trula Slade, DPM  montelukast (SINGULAIR) 10 MG tablet Take 10 mg by mouth at bedtime.    [provider]  ondansetron (ZOFRAN) 4 MG tablet TK 1 T PO QID PRN 07/01/18   [provider]  pantoprazole (PROTONIX) 40 MG tablet Take 1 tablet (40 mg total) by mouth 2 (two) times daily. 12/17/17   Collene Gobble, MD  predniSONE (DELTASONE) 10  MG tablet Take 3 tablets for 3 days, 2 tablets for 3 days, 1 tablet for 3 days 08/19/18   Collene Gobble, MD  traMADol Veatrice Bourbon) 50 MG tablet TK 1 T PO BID 05/06/18   [provider]    Family History Family History  Problem Relation Age of Onset  . Asthma Father   . Diabetes Other   . Heart failure Other     Social History Social History   Tobacco Use  . Smoking status: Never Smoker  . Smokeless tobacco: Never Used  Substance Use Topics  . Alcohol use: Yes    Comment: socially   . Drug use: No     Allergies   Cefoxitin, Amoxicillin, Asa [aspirin], and Sulfa antibiotics   Review of Systems Review of Systems  Constitutional: Positive for fatigue. Negative for fever.  HENT: Positive for sore throat.   Respiratory: Positive for cough and shortness of breath. Negative for wheezing.   Cardiovascular: Negative for chest pain.  Gastrointestinal: Negative for diarrhea and vomiting.  Neurological: Positive for headaches.  All other systems reviewed and are negative.    Physical Exam Updated Vital Signs BP (!) 129/101 (BP Location: Right Arm)   Pulse (!) 109   Temp 97.9 F (36.6 C) (Oral)   Resp 19   Ht 5\' 3"  (1.6 m)   Wt 83.9 kg   LMP 12/02/2018   SpO2 100%   BMI 32.77 kg/m   Physical Exam Vitals signs and nursing note reviewed.  Constitutional:      General: She is not in acute distress.    Appearance: She is well-developed. She is not ill-appearing or diaphoretic.  HENT:     Head: Normocephalic and atraumatic.     Right Ear: External ear normal.     Left Ear: External ear normal.     Nose: Nose normal.  Eyes:     General:        Right eye: No discharge.        Left eye: No discharge.  Cardiovascular:     Rate and Rhythm: Regular rhythm. Tachycardia present.     Heart sounds: Normal heart sounds.  Pulmonary:     Effort: Pulmonary effort is normal. No tachypnea, accessory muscle usage or respiratory distress.     Breath sounds: Normal breath  sounds. No wheezing or rales.  Abdominal:     Palpations: Abdomen is soft.     Tenderness: There is no abdominal tenderness.  Skin:    General: Skin is warm and dry.  Neurological:     Mental Status: She is alert.  Psychiatric:  Mood and Affect: Mood is not anxious.      ED Treatments / Results  Labs (all labs ordered are listed, but only abnormal results are displayed) Labs Reviewed  COMPREHENSIVE METABOLIC PANEL - Abnormal; Notable for the following components:      Result Value   Glucose, Bld 111 (*)    Calcium 8.8 (*)    AST 14 (*)    All other components within normal limits  CBC WITH DIFFERENTIAL/PLATELET - Abnormal; Notable for the following components:   WBC 11.3 (*)    All other components within normal limits    EKG EKG Interpretation  Date/Time:  Saturday January 11 2019 18:06:40 EDT Ventricular Rate:  91 PR Interval:    QRS Duration: 81 QT Interval:  349 QTC Calculation: 430 R Axis:   43 Text Interpretation:  Sinus rhythm no acute ST/T changes no significant change since 2019 Confirmed by Sherwood Gambler [phone]) on 01/11/2019 6:29:07 PM   Radiology Dg Chest Portable 1 View  Result Date: 01/11/2019 CLINICAL DATA:  Cough and shortness of breath EXAM: PORTABLE CHEST 1 VIEW COMPARISON:  08/02/2018 FINDINGS: The heart size and mediastinal contours are within normal limits. Both lungs are clear. The visualized skeletal structures are unremarkable. IMPRESSION: No active disease. Electronically Signed   By: Inez Catalina M.D.   On: 01/11/2019 18:40    Procedures Procedures (including critical care time)  Medications Ordered in ED Medications  sodium chloride 0.9 % bolus 1,000 mL (1,000 mLs Intravenous New Bag/Given 01/11/19 1812)     Initial Impression / Assessment and Plan / ED Course  I have reviewed the triage vital signs and the nursing notes.  Pertinent labs & imaging results that were available during my care of the patient were reviewed by me and  considered in my medical decision making (see chart for details).        Patient appears well.  She was given IV fluids for the mild tachycardia which is likely related to some mild dehydration from what is probably a viral respiratory illness.  Could be COVID-19 given the current pandemic.  She is currently being tested and states these results are not back yet I do not think we need to retest.  Otherwise, my suspicion for ACS, PE, occult pneumonia, etc. is pretty low.  No asthma at this time.  Discussed supportive care and need to self isolate as well as return precautions.  Evanne Matsunaga was evaluated in Emergency Department on 01/11/2019 for the symptoms described in the history of present illness. She was evaluated in the context of the global COVID-19 pandemic, which necessitated consideration that the patient might be at risk for infection with the SARS-CoV-2 virus that causes COVID-19. Institutional protocols and algorithms that pertain to the evaluation of patients at risk for COVID-19 are in a state of rapid change based on information released by regulatory bodies including the CDC and federal and state organizations. These policies and algorithms were followed during the patient's care in the ED.   Final Clinical Impressions(s) / ED Diagnoses   Final diagnoses:  Viral respiratory infection  Suspected Covid-19 Virus Infection    ED Discharge Orders    None       Sherwood Gambler, MD 01/11/19 1905

## 2019-01-11 NOTE — ED Notes (Signed)
Pt received d/c papers. Pt ambulated out door

## 2019-01-11 NOTE — ED Triage Notes (Signed)
Pt was at [employer] 06/27-07/05.  Monday pt had headache. Wednesday pt reports cough and sob that continues to get worse.

## 2019-01-12 ENCOUNTER — Encounter (INDEPENDENT_AMBULATORY_CARE_PROVIDER_SITE_OTHER): Payer: Self-pay

## 2019-01-13 ENCOUNTER — Encounter (INDEPENDENT_AMBULATORY_CARE_PROVIDER_SITE_OTHER): Payer: Self-pay

## 2019-01-14 ENCOUNTER — Encounter (INDEPENDENT_AMBULATORY_CARE_PROVIDER_SITE_OTHER): Payer: Self-pay

## 2019-01-15 ENCOUNTER — Encounter (INDEPENDENT_AMBULATORY_CARE_PROVIDER_SITE_OTHER): Payer: Self-pay

## 2019-01-16 ENCOUNTER — Other Ambulatory Visit: Payer: Self-pay

## 2019-01-16 ENCOUNTER — Encounter (INDEPENDENT_AMBULATORY_CARE_PROVIDER_SITE_OTHER): Payer: Self-pay

## 2019-01-16 DIAGNOSIS — Z20822 Contact with and (suspected) exposure to covid-19: Secondary | ICD-10-CM

## 2019-01-16 LAB — NOVEL CORONAVIRUS, NAA: SARS-CoV-2, NAA: NOT DETECTED

## 2019-01-17 ENCOUNTER — Encounter (INDEPENDENT_AMBULATORY_CARE_PROVIDER_SITE_OTHER): Payer: Self-pay

## 2019-01-18 ENCOUNTER — Telehealth: Payer: Self-pay | Admitting: Emergency Medicine

## 2019-01-18 ENCOUNTER — Encounter (INDEPENDENT_AMBULATORY_CARE_PROVIDER_SITE_OTHER): Payer: Self-pay

## 2019-01-18 ENCOUNTER — Other Ambulatory Visit: Payer: Self-pay | Admitting: Emergency Medicine

## 2019-01-18 NOTE — Telephone Encounter (Signed)
Attempted call to patient, no answer.  Left message to return call with this RN's number until 7am.  Will attempt again shortly, and leave for morning due to time of night if no answer.

## 2019-01-19 ENCOUNTER — Encounter (INDEPENDENT_AMBULATORY_CARE_PROVIDER_SITE_OTHER): Payer: Self-pay

## 2019-01-19 NOTE — Telephone Encounter (Signed)
Spoke with patient after using two identifiers, when patient returned this RNs call concerning symptoms changes on COVID questionnaire.  Patient verbalized that she had experience a sudden increase in her SOB.  Patient with a hx of asthma.  Has home nebulizer, as well as SpO2 monitor.  Reports SpO2 dropped to 92% right before submitting questionnaire.  States used a breathing treatment, and improved to 94%.  Patient speaking in full sentences on the phone.  Patient states she feels better, but coughing frequently during call.  This RN used Aeronautical engineer and made recommendations for home treatments (tea, honey, cough drops), which patient states she has at home and will begin using.  This RN also reviewed SOB protocols with patient.  Patient states SOB with exertion.  Patient placed SpO2 with RN on phone and states 94%.  Patient's temp taken on phone with RN, temp 98.6.  Patient states HR after breathing treatment 100.  Patient states she understands SOB monitoring with home SpO2 monitor.  ER precautions reviewed, reiterated, and patient verbalized understanding.  Patient states will follow up with PCP or [facility] tomorrow if symptoms not improving.  Patient encouraged to monitor SpO2 and temp closely through out evening.  Patient to return call with any questions, or needing further guidance.

## 2019-01-20 ENCOUNTER — Ambulatory Visit: Payer: Self-pay | Admitting: *Deleted

## 2019-01-20 ENCOUNTER — Encounter (INDEPENDENT_AMBULATORY_CARE_PROVIDER_SITE_OTHER): Payer: Self-pay

## 2019-01-20 NOTE — Telephone Encounter (Signed)
Pt called in requesting COVID-19 test result.    The result is not back yet.   The agent is going to let the pt know this

## 2019-01-21 ENCOUNTER — Telehealth: Payer: Self-pay | Admitting: Emergency Medicine

## 2019-01-21 ENCOUNTER — Other Ambulatory Visit: Payer: Self-pay

## 2019-01-21 ENCOUNTER — Ambulatory Visit: Payer: 59 | Admitting: Podiatry

## 2019-01-21 LAB — NOVEL CORONAVIRUS, NAA: SARS-CoV-2, NAA: NOT DETECTED

## 2019-01-21 MED ORDER — PREDNISONE 10 MG PO TABS: ORAL_TABLET | ORAL | 0 refills | Status: DC

## 2019-01-21 MED ORDER — BREO ELLIPTA 200-25 MCG/INH IN AEPB: 1.0000 | INHALATION_SPRAY | Freq: Every day | RESPIRATORY_TRACT | 3 refills | Status: DC

## 2019-01-21 MED ORDER — MONTELUKAST SODIUM 10 MG PO TABS: 10.0000 mg | ORAL_TABLET | Freq: Every day | ORAL | 2 refills | Status: DC

## 2019-01-21 NOTE — Telephone Encounter (Signed)
Called and spoke with pt who stated she has had a cough off and on since July 9 but cough has become progressively worse.  Pt stated she had an asthma attack 4 nights ago and had to use her nebulizer to help with symptoms. Pt stated she is still SOB but not as bad as she was when she had the asthma attack.  Pt does have some tightness in chest and also has some wheezing.  Pt had covid test performed twice and each time results came back negative.  Pt stated work is not letting her come back due to still having the SOB and cough even though she believes it is related to her asthma.  Pt has not had any fever.  Pt wants to know what we recommend to help with her symptoms. Pt does have a follow up appt scheduled with [place] next Wed. 7/29 but wants to know if something might be able to be prescribed to help hold her over until that appt. Dr. Lamonte Sakai, please advise on this. Thanks!

## 2019-01-21 NOTE — Telephone Encounter (Signed)
I believe we should treat her for a possible asthma flare, and once resolved I will arrange a methacholine challenge for her to definitively determine whether there is any asthma present.   Please give her pred 40mg  daily for 3 days, then 30mg  daily for 3 days, then 20mg  daily for 3 days, then 10mg  daily for 3 days, then stop

## 2019-01-21 NOTE — Telephone Encounter (Signed)
Called and pose with Patient. RB recommendations given.Undersatnding stated.  Prescription sent to requested pharmacy.  Patient stated she had to have OV visit to stated she could return to work, and this is a asthma related flare, not viral, or covid. Patient scheduled OV/televisit with Tonya, NO 0900, 01/22/19. Nothing further at this time.

## 2019-01-21 NOTE — Telephone Encounter (Signed)
Left message for patient to call back  

## 2019-01-21 NOTE — Telephone Encounter (Signed)
Pt is calling back  [phone]

## 2019-01-22 ENCOUNTER — Other Ambulatory Visit: Payer: Self-pay

## 2019-01-22 ENCOUNTER — Ambulatory Visit (INDEPENDENT_AMBULATORY_CARE_PROVIDER_SITE_OTHER): Payer: 59 | Admitting: Nurse Practitioner

## 2019-01-22 ENCOUNTER — Encounter: Payer: Self-pay | Admitting: General Surgery

## 2019-01-22 ENCOUNTER — Encounter: Payer: Self-pay | Admitting: Nurse Practitioner

## 2019-01-22 ENCOUNTER — Other Ambulatory Visit: Payer: Self-pay | Admitting: Emergency Medicine

## 2019-01-22 DIAGNOSIS — J45909 Unspecified asthma, uncomplicated: Secondary | ICD-10-CM

## 2019-01-22 DIAGNOSIS — J45901 Unspecified asthma with (acute) exacerbation: Secondary | ICD-10-CM

## 2019-01-22 NOTE — Patient Instructions (Addendum)
Continue prednisone 40mg  daily for 3 days, then 30mg  daily for 3 days, then 20mg  daily for 3 days, then 10mg  daily for 3 days, then stop Will give were negative patient to return to work. Will send through my chart.   Continue Breo Ellipta 200 1 puff daily in the morning right when you wake up >>>Rinse your mouth out after use >>>This is a daily maintenance inhaler, NOT a rescue inhaler >>>It is important for you to take this daily and not miss any doses  Only use your albuterol as a rescue medication to be used if you can't catch your breath by resting or doing a relaxed purse lip breathing pattern.  - The less you use it, the better it will work when you need it. - Ok to use up to 2 puffs every 4 hours if you must but call for immediate appointment if use goes up over your usual need - Don't leave home without it !! (think of it like the spare tire for your car)    Continue Xyzal daily Continue Singulair daily  Follow up: Follow up with Dr. Lamonte Sakai in 4-6 weeks with Methacholine challenge before visit Follow up sooner if symptoms

## 2019-01-22 NOTE — Progress Notes (Signed)
Virtual Visit via Telephone Note  I connected with Nicole Rodriguez on 01/22/19 at  9:00 AM EDT by telephone and verified that I am speaking with the correct person using two identifiers.  Location: Patient: home Provider: office   I discussed the limitations, risks, security and privacy concerns of performing an evaluation and management service by telephone and the availability of in person appointments. I also discussed with the patient that there may be a patient responsible charge related to this service. The patient expressed understanding and agreed to proceed.   History of Present Illness: 28 year old female never smoker with asthma followed by Dr. Lamonte Sakai. Maintenance: Breo 200  Patient has a tele-visit today for an acute visit.  Patient states that she has had a nonproductive cough, shortness of breath, wheezing.  Patient has a history of asthma.  Symptoms started around July 9.  Patient called the office yesterday due to symptoms progressively worsening.  Dr. Lamonte Sakai called in a prednisone taper and patient states that she is doing much better this morning.  Patient is requesting a letter for work to return back to work.  She has recently had a COVID test performed that was negative.  Patient is compliant with Breo, albuterol, Xyzal, and Singulair.  He does have albuterol and Pulmicort nebulizers if needed.  She denies any recent fever. Denies f/c/s, n/v/d, hemoptysis, PND, leg swelling.     Observations/Objective: CXR 01/11/19 -no active disease  PFT: PFT Results Latest Ref Rng & Units 12/14/2017  FVC-Pre L 3.20  FVC-Predicted Pre % 90  FVC-Post L 3.28  FVC-Predicted Post % 92  Pre FEV1/FVC % % 88  Post FEV1/FCV % % 88  FEV1-Pre L 2.83  FEV1-Predicted Pre % 93  FEV1-Post L 2.88  DLCO UNC% % 100  DLCO COR %Predicted % 110  TLC L 5.08  TLC % Predicted % 106  RV % Predicted % 139     Assessment and Plan: Asthma Exacerbation: Patient has a tele-visit today for an acute  visit.  Patient states that she has had a nonproductive cough, shortness of breath, wheezing.  Patient has a history of asthma.  Symptoms started around July 9.  Patient called the office yesterday due to symptoms progressively worsening.  Dr. Lamonte Sakai called in a prednisone taper and patient states that she is doing much better this morning.  Patient is requesting a letter for work to return back to work.  She has recently had a COVID test performed that was negative.  Patient is compliant with Breo, albuterol, Xyzal, and Singulair.  He does have albuterol and Pulmicort nebulizers if needed.   Patient Instructions  Continue prednisone 40mg  daily for 3 days, then 30mg  daily for 3 days, then 20mg  daily for 3 days, then 10mg  daily for 3 days, then stop Will give were negative patient to return to work. Will send through my chart.  Will order methacholine challenge per Dr. Agustina Caroli note yesterday  Continue Breo Ellipta 200 1 puff daily in the morning right when you wake up >>>Rinse your mouth out after use >>>This is a daily maintenance inhaler, NOT a rescue inhaler >>>It is important for you to take this daily and not miss any doses  Only use your albuterol as a rescue medication to be used if you can't catch your breath by resting or doing a relaxed purse lip breathing pattern.  - The less you use it, the better it will work when you need it. - Ok to use up to 2  puffs every 4 hours if you must but call for immediate appointment if use goes up over your usual need - Don't leave home without it !! (think of it like the spare tire for your car)    Continue Xyzal daily Continue Singulair daily    Follow Up Instructions: Follow up with Dr. Lamonte Sakai in 4-6 weeks with Methacholine challenge before visit Follow up sooner if symptoms    I discussed the assessment and treatment plan with the patient. The patient was provided an opportunity to ask questions and all were answered. The patient agreed  with the plan and demonstrated an understanding of the instructions.   The patient was advised to call back or seek an in-person evaluation if the symptoms worsen or if the condition fails to improve as anticipated.  I provided 22 minutes of non-face-to-face time during this encounter.   Fenton Foy, NP

## 2019-01-22 NOTE — Assessment & Plan Note (Signed)
Asthma Exacerbation: Patient has a tele-visit today for an acute visit.  Patient states that she has had a nonproductive cough, shortness of breath, wheezing.  Patient has a history of asthma.  Symptoms started around July 9.  Patient called the office yesterday due to symptoms progressively worsening.  Dr. Lamonte Sakai called in a prednisone taper and patient states that she is doing much better this morning.  Patient is requesting a letter for work to return back to work.  She has recently had a COVID test performed that was negative.  Patient is compliant with Breo, albuterol, Xyzal, and Singulair.  He does have albuterol and Pulmicort nebulizers if needed.   Patient Instructions  Continue prednisone 40mg  daily for 3 days, then 30mg  daily for 3 days, then 20mg  daily for 3 days, then 10mg  daily for 3 days, then stop Will give were negative patient to return to work. Will send through my chart.  Will order methacholine challenge per Dr. Agustina Caroli note yesterday  Continue Breo Ellipta 200 1 puff daily in the morning right when you wake up >>>Rinse your mouth out after use >>>This is a daily maintenance inhaler, NOT a rescue inhaler >>>It is important for you to take this daily and not miss any doses  Only use your albuterol as a rescue medication to be used if you can't catch your breath by resting or doing a relaxed purse lip breathing pattern.  - The less you use it, the better it will work when you need it. - Ok to use up to 2 puffs every 4 hours if you must but call for immediate appointment if use goes up over your usual need - Don't leave home without it !! (think of it like the spare tire for your car)    Continue Xyzal daily Continue Singulair daily  Follow up: Follow up with Dr. Lamonte Sakai in 4-6 weeks with Methacholine challenge before visit Follow up sooner if symptoms

## 2019-01-27 ENCOUNTER — Other Ambulatory Visit (HOSPITAL_COMMUNITY)
Admission: RE | Admit: 2019-01-27 | Discharge: 2019-01-27 | Disposition: A | Discharge status: Home / Self Care | Payer: 59 | Source: Ambulatory Visit | Attending: Emergency Medicine | Admitting: Emergency Medicine

## 2019-01-27 ENCOUNTER — Other Ambulatory Visit: Payer: Self-pay

## 2019-01-27 DIAGNOSIS — Z20828 Contact with and (suspected) exposure to other viral communicable diseases: Secondary | ICD-10-CM | POA: Insufficient documentation

## 2019-01-28 LAB — SARS CORONAVIRUS 2 (TAT 6-24 HRS): SARS Coronavirus 2: NEGATIVE

## 2019-01-29 ENCOUNTER — Ambulatory Visit: Payer: Self-pay | Admitting: Emergency Medicine

## 2019-01-29 ENCOUNTER — Ambulatory Visit (HOSPITAL_COMMUNITY)
Admission: RE | Admit: 2019-01-29 | Discharge: 2019-01-29 | Disposition: A | Discharge status: Home / Self Care | Payer: 59 | Source: Ambulatory Visit | Attending: Emergency Medicine | Admitting: Emergency Medicine

## 2019-01-29 ENCOUNTER — Other Ambulatory Visit: Payer: Self-pay

## 2019-01-29 ENCOUNTER — Ambulatory Visit: Payer: 59 | Admitting: Nurse Practitioner

## 2019-01-29 DIAGNOSIS — J45909 Unspecified asthma, uncomplicated: Secondary | ICD-10-CM | POA: Diagnosis present

## 2019-01-29 LAB — PULMONARY FUNCTION TEST
FEF 25-75 Post: 3.27 L/sec
FEF 25-75 Pre: 3.76 L/sec
FEF2575-%Change-Post: -13 %
FEF2575-%Pred-Post: 95 %
FEF2575-%Pred-Pre: 110 %
FEV1-%Change-Post: -3 %
FEV1-%Pred-Post: 90 %
FEV1-%Pred-Pre: 94 %
FEV1-Post: 2.75 L
FEV1-Pre: 2.86 L
FEV1FVC-%Change-Post: -4 %
FEV1FVC-%Pred-Pre: 102 %
FEV6-%Change-Post: 1 %
FEV6-%Pred-Post: 93 %
FEV6-%Pred-Pre: 92 %
FEV6-Post: 3.31 L
FEV6-Pre: 3.26 L
FEV6FVC-%Pred-Post: 100 %
FEV6FVC-%Pred-Pre: 100 %
FVC-%Change-Post: 0 %
FVC-%Pred-Post: 93 %
FVC-%Pred-Pre: 92 %
FVC-Post: 3.31 L
FVC-Pre: 3.29 L
Post FEV1/FVC ratio: 83 %
Post FEV6/FVC ratio: 100 %
Pre FEV1/FVC ratio: 87 %
Pre FEV6/FVC Ratio: 100 %

## 2019-01-29 MED ORDER — ALBUTEROL SULFATE (2.5 MG/3ML) 0.083% IN NEBU
2.5000 mg | INHALATION_SOLUTION | Freq: Once | RESPIRATORY_TRACT | Status: AC
  Administered 2019-01-29: 2.5 mg via RESPIRATORY_TRACT

## 2019-01-29 MED ORDER — SODIUM CHLORIDE 0.9 % IN NEBU
3.0000 mL | INHALATION_SOLUTION | Freq: Once | RESPIRATORY_TRACT | Status: AC
  Administered 2019-01-29: 3 mL via RESPIRATORY_TRACT
  Filled 2019-01-29: qty 3

## 2019-01-29 MED ORDER — METHACHOLINE 0.25 MG/ML NEB SOLN
2.0000 mL | Freq: Once | RESPIRATORY_TRACT | Status: AC
  Administered 2019-01-29: 0.5 mg via RESPIRATORY_TRACT
  Filled 2019-01-29: qty 2

## 2019-01-29 MED ORDER — METHACHOLINE 16 MG/ML NEB SOLN
2.0000 mL | Freq: Once | RESPIRATORY_TRACT | Status: AC
  Administered 2019-01-29: 32 mg via RESPIRATORY_TRACT
  Filled 2019-01-29: qty 2

## 2019-01-29 MED ORDER — METHACHOLINE 1 MG/ML NEB SOLN
2.0000 mL | Freq: Once | RESPIRATORY_TRACT | Status: AC
  Administered 2019-01-29: 2 mg via RESPIRATORY_TRACT
  Filled 2019-01-29: qty 2

## 2019-01-29 MED ORDER — METHACHOLINE 0.0625 MG/ML NEB SOLN
2.0000 mL | Freq: Once | RESPIRATORY_TRACT | Status: AC
  Administered 2019-01-29: 0.125 mg via RESPIRATORY_TRACT
  Filled 2019-01-29: qty 2

## 2019-01-29 MED ORDER — METHACHOLINE 4 MG/ML NEB SOLN
2.0000 mL | Freq: Once | RESPIRATORY_TRACT | Status: AC
  Administered 2019-01-29: 8 mg via RESPIRATORY_TRACT
  Filled 2019-01-29: qty 2

## 2019-01-31 ENCOUNTER — Ambulatory Visit: Payer: 59 | Admitting: Podiatry

## 2019-02-14 ENCOUNTER — Other Ambulatory Visit: Payer: Self-pay | Admitting: Podiatry

## 2019-02-17 NOTE — Telephone Encounter (Signed)
She needs to come in to be seen before refilled.

## 2019-02-26 ENCOUNTER — Other Ambulatory Visit: Payer: Self-pay

## 2019-02-26 ENCOUNTER — Encounter: Payer: Self-pay | Admitting: Emergency Medicine

## 2019-02-26 ENCOUNTER — Ambulatory Visit: Payer: 59 | Admitting: Emergency Medicine

## 2019-02-26 DIAGNOSIS — Z23 Encounter for immunization: Secondary | ICD-10-CM | POA: Diagnosis not present

## 2019-02-26 DIAGNOSIS — J301 Allergic rhinitis due to pollen: Secondary | ICD-10-CM

## 2019-02-26 DIAGNOSIS — K219 Gastro-esophageal reflux disease without esophagitis: Secondary | ICD-10-CM | POA: Diagnosis not present

## 2019-02-26 DIAGNOSIS — J45909 Unspecified asthma, uncomplicated: Secondary | ICD-10-CM | POA: Diagnosis not present

## 2019-02-26 NOTE — Assessment & Plan Note (Signed)
Continue same regimen of Xyzal, Singulair, fluticasone nasal spray

## 2019-02-26 NOTE — Assessment & Plan Note (Signed)
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  We talked about this in detail today.  I showed her her methacholine challenge.  We talked about upper airway relaxation techniques if and when she develops stridor or dyspnea.  We're going to avoid all powdered bronchodilators, stop Breo today.  She does have some possible evidence for hyperresponsiveness based on FEF 25-75%, unclear significance.  She will keep an albuterol inhaler available to use as needed.  Continue to treat her underlying contributors to upper airway irritation including allergic rhinitis and GERD.

## 2019-02-26 NOTE — Patient Instructions (Addendum)
Please stop Breo. Please keep albuterol available so that you can use 2 puffs if needed for shortness of breath, chest tightness, wheezing.  You should not use powdered inhaler (Respi-click). Please continue Xyzal, Singulair, fluticasone nasal spray as you have been taking them. Please continue Protonix twice a day. Follow with Dr Lamonte Sakai in 6 months or sooner if you have any problems

## 2019-02-26 NOTE — Assessment & Plan Note (Signed)
- 

## 2019-02-26 NOTE — Progress Notes (Signed)
Subjective:    Patient ID: Nicole Rodriguez, female    DOB: Apr 17, 1991, 28 y.o.   MRN: [id number]  HPI 28 year old never smoker with a history of asthma that was made clinically in 2016.  She has been dealing with chronic cough, chest tightness and heaviness. The heaviness is worst w activity and at night. The cough is worst at night, can produce thick mucous.  Happens with exertion, cold air, fumes, smoke. She has some allergic rhinitis. Rare reflux sx. Interestingly her sx are not reliably relieved by BD or by prednisone. pulmicort was added to breo recently.   She works at [employer], breathes in dusts, solvents.   ROV 12/27/17 --28follow-up visit for never smoker with a history of clinical asthma.  She also has allergic rhinitis and chronic cough.  Symptoms include chest tightness and heaviness especially with exertion or with particular exposures.  She is been treated with bronchodilators, prednisone without much response.  At her initial visit I continued Breo and stopped Pulmicort.  We tried to treat potential contributors like GERD and allergic rhinitis.  She had pulmonary function testing done on 6/14 that showed normal spirometry without a bronchodilator response, borderline hyperinflated lung volumes and normal DLCO. Her cough is better > the protonix bid has been very helpful.    ROV 08/19/18 --28 year old woman, never smoker with chronic cough in the setting of GERD, allergic rhinitis.  She carries a history of clinical asthma although pulmonary function testing 12/2017 showed no evidence for obstruction.  At that time we tried stopping Breo to see how she would tolerate - was restarted in July.  Had been well until this illness. Cough had been better on the protonix.  She returns today reporting that she had a viral infection, diagnosed with flu 1/29. She was then seen in ED 2/6 for cough, dyspnea, purulent sputum. Took some hydromet.  Received medrol pack, no abx. Sputum now back to white. She is on  her xyzal, flonase, singulair - having some breakthrough rhinitis.   ROV 02/26/2019 --[name] follows up today for possible asthma.  She has had chronic recurrent cough, sputum production, chest heaviness as her principal symptoms.  Usually happens with exposures and is responsive to corticosteroids.  She has been managed on Xyzal, Singulair, fluticasone nasal spray for chronic rhinitis.  She was started on Breo.  She has albuterol available and uses approximately . She had a methacholine challenge that showed no evidence for hyper-responsiveness based on FEV1.  There may be some evidence for mid flow hyperresponsiveness based on FEF 25-75%, but unclear whether this carries true significance.   Review of Systems  Constitutional: Negative for fever and unexpected weight change.  HENT: Negative for congestion, dental problem, ear pain, nosebleeds, postnasal drip, rhinorrhea, sinus pressure, sneezing, sore throat and trouble swallowing.   Eyes: Negative for redness and itching.  Respiratory: Positive for cough, chest tightness, shortness of breath and wheezing.   Cardiovascular: Negative for palpitations and leg swelling.  Gastrointestinal: Negative for nausea and vomiting.  Genitourinary: Negative for dysuria.  Musculoskeletal: Negative for joint swelling.  Skin: Negative for rash.  Neurological: Negative for headaches.  Hematological: Does not bruise/bleed easily.  Psychiatric/Behavioral: Negative for dysphoric mood. The patient is not nervous/anxious.        Objective:   Physical Exam Vitals:   02/26/19 1641  BP: 124/80  Pulse: 90  SpO2: 98%  Weight: 190 lb (86.2 kg)  Height: 5\' 2"  (1.575 m)   Gen: Pleasant, well-nourished, in no distress,  normal affect, not coughing today  ENT: No lesions,  mouth clear,  oropharynx clear, no postnasal drip  Neck: No JVD, no stridor  Lungs: No use of accessory muscles, no wheeze  Cardiovascular: RRR, heart sounds normal, no murmur or gallops,  no peripheral edema  Musculoskeletal: No deformities, no cyanosis or clubbing  Neuro: alert, non focal  Skin: Warm, no lesions or rash    Assessment & Plan:  Asthma Based on her methacholine challenge I believe that the majority of her symptoms and flares have been due to 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  We talked about this in detail today.  I showed her her methacholine challenge.  We talked about upper airway relaxation techniques if and when she develops stridor or dyspnea.  We're going to avoid all powdered bronchodilators, stop Breo today.  She does have some possible evidence for hyperresponsiveness based on FEF 25-75%, unclear significance.  She will keep an albuterol inhaler available to use as needed.  Continue to treat her underlying contributors to upper airway irritation including allergic rhinitis and GERD.  Allergic rhinitis Continue same regimen of Xyzal, Singulair, fluticasone nasal spray  GERD (gastroesophageal reflux disease) Continue Protonix twice daily  Baltazar Apo, MD, PhD 02/26/2019, 5:15 PM [facility] [phone] or if no answer [phone]

## 2019-02-28 ENCOUNTER — Ambulatory Visit: Payer: 59 | Admitting: Podiatry

## 2019-02-28 ENCOUNTER — Encounter: Payer: Self-pay | Admitting: Podiatry

## 2019-02-28 MED ORDER — FLUTICASONE PROPIONATE 50 MCG/ACT NA SUSP: 1.0000 | Freq: Every day | NASAL | 11 refills | Status: DC

## 2019-03-04 ENCOUNTER — Ambulatory Visit: Payer: 59 | Admitting: Podiatry

## 2019-03-14 ENCOUNTER — Encounter: Payer: Self-pay | Admitting: Obstetrics and Gynecology

## 2019-03-14 ENCOUNTER — Ambulatory Visit (INDEPENDENT_AMBULATORY_CARE_PROVIDER_SITE_OTHER): Payer: 59 | Admitting: Obstetrics and Gynecology

## 2019-03-14 ENCOUNTER — Other Ambulatory Visit: Payer: Self-pay

## 2019-03-14 VITALS — BP 128/76 | HR 103 | Ht 62.0 in | Wt 189.0 lb

## 2019-03-14 DIAGNOSIS — Z1239 Encounter for other screening for malignant neoplasm of breast: Secondary | ICD-10-CM

## 2019-03-14 DIAGNOSIS — Z01419 Encounter for gynecological examination (general) (routine) without abnormal findings: Secondary | ICD-10-CM

## 2019-03-14 NOTE — Progress Notes (Signed)
Gynecology Annual Exam   PCP: Neale Burly, MD  Chief Complaint:  Chief Complaint  Patient presents with  . Gynecologic Exam    History of Present Illness: Patient is a 28 y.o. G0P0000 presents for annual exam. The patient has no complaints today.   LMP: Patient's last menstrual period was 02/24/2019 (exact date). Average Interval: regular monthly Duration of flow: 5 days Heavy Menses: no Clots: no Intermenstrual Bleeding: no Postcoital Bleeding: not applicable Dysmenorrhea: no  The patient is sexually active. She currently uses none for contraception as she is in a same sex relationshiop. There is no notable family history of breast or ovarian cancer in her family.  The patient wears seatbelts: yes.   The patient has regular exercise: not asked.    The patient denies current symptoms of depression.    Review of Systems: Review of Systems  Constitutional: Negative for chills and fever.  HENT: Negative for congestion.   Respiratory: Negative for cough and shortness of breath.   Cardiovascular: Negative for chest pain and palpitations.  Gastrointestinal: Negative for abdominal pain, constipation, diarrhea, heartburn, nausea and vomiting.  Genitourinary: Negative for dysuria, frequency and urgency.  Skin: Negative for itching and rash.  Neurological: Negative for dizziness and headaches.  Endo/Heme/Allergies: Negative for polydipsia.  Psychiatric/Behavioral: Negative for depression.    Past Medical History:  Past Medical History:  Diagnosis Date  . Asthma     Past Surgical History:  Past Surgical History:  Procedure Laterality Date  . APPENDECTOMY    . BREAST LUMPECTOMY    . FOOT SURGERY      Gynecologic History:  Patient's last menstrual period was 02/24/2019 (exact date). Contraception: none Last Pap: Results were:09/14/2016 no abnormalities   Obstetric History: G0P0000  Family History:  Family History  Problem Relation Age of Onset  . Asthma  Father   . Diabetes Other   . Heart failure Other     Social History:  Social History   Socioeconomic History  . Marital status: Married    Spouse name: Not on file  . Number of children: Not on file  . Years of education: Not on file  . Highest education level: Not on file  Occupational History  . Not on file  Social Needs  . Financial resource strain: Not on file  . Food insecurity    Worry: Not on file    Inability: Not on file  . Transportation needs    Medical: Not on file    Non-medical: Not on file  Tobacco Use  . Smoking status: Never Smoker  . Smokeless tobacco: Never Used  Substance and Sexual Activity  . Alcohol use: Yes    Comment: socially   . Drug use: No  . Sexual activity: Yes    Birth control/protection: None  Lifestyle  . Physical activity    Days per week: Not on file    Minutes per session: Not on file  . Stress: Not on file  Relationships  . Social Herbalist on phone: Not on file    Gets together: Not on file    Attends religious service: Not on file    Active member of club or organization: Not on file    Attends meetings of clubs or organizations: Not on file    Relationship status: Not on file  . Intimate partner violence    Fear of current or ex partner: Not on file    Emotionally abused: Not on  file    Physically abused: Not on file    Forced sexual activity: Not on file  Other Topics Concern  . Not on file  Social History Narrative  . Not on file    Allergies:  Allergies  Allergen Reactions  . Cefoxitin Itching and Rash  . Amoxicillin Hives  . Asa [Aspirin] Hives  . Sulfa Antibiotics Hives    Medications: Prior to Admission medications   Medication Sig Start Date End Date Taking? Authorizing Provider  albuterol (PROVENTIL) (2.5 MG/3ML) 0.083% nebulizer solution Take 2.5 mg by nebulization every 6 (six) hours as needed for wheezing or shortness of breath.    [provider]  Albuterol Sulfate (PROAIR  RESPICLICK) 123XX123 (90 Base) MCG/ACT AEPB Inhale 1-2 puffs into the lungs every 6 (six) hours as needed (shortness of breath).    [provider]  ciclopirox (PENLAC) 8 % solution Apply topically at bedtime. Apply over nail and surrounding skin. Apply daily over previous coat. After seven (7) days, may remove with alcohol and continue cycle. 10/14/18   Trula Slade, DPM  fluticasone (FLONASE) 50 MCG/ACT nasal spray Place 1 spray into both nostrils daily. 02/28/19   Collene Gobble, MD  fluticasone furoate-vilanterol (BREO ELLIPTA) 200-25 MCG/INH AEPB Inhale 1 puff into the lungs daily. 01/21/19   Collene Gobble, MD  levocetirizine (XYZAL) 5 MG tablet Take 1 tablet (5 mg total) by mouth every evening. 12/26/18   Byrum, Rose Fillers, MD  LORazepam (ATIVAN) 0.5 MG tablet Take 0.5 mg by mouth every 8 (eight) hours.    [provider]  meloxicam (MOBIC) 15 MG tablet Take 1 tablet (15 mg total) by mouth daily. 11/07/18 11/07/19  Trula Slade, DPM  montelukast (SINGULAIR) 10 MG tablet Take 1 tablet (10 mg total) by mouth at bedtime. 01/21/19   Collene Gobble, MD  pantoprazole (PROTONIX) 40 MG tablet TAKE 1 TABLET(40 MG) BY MOUTH TWICE DAILY 01/20/19   Collene Gobble, MD    Physical Exam Vitals: Blood pressure 128/76, pulse (!) 103, height 5\' 2"  (1.575 m), weight 189 lb (85.7 kg), last menstrual period 02/24/2019.   General: NAD HEENT: normocephalic, anicteric Thyroid: no enlargement, no palpable nodules Pulmonary: No increased work of breathing, CTAB Cardiovascular: RRR, distal pulses 2+ Breast: Breast symmetrical, no tenderness, no palpable nodules or masses, no skin or nipple retraction present, no nipple discharge.  No axillary or supraclavicular lymphadenopathy. Abdomen: NABS, soft, non-tender, non-distended.  Umbilicus without lesions.  No hepatomegaly, splenomegaly or masses palpable. No evidence of hernia  Genitourinary:  External: Normal external female genitalia.  Normal  urethral meatus, normal Bartholin's and Skene's glands.    Vagina: Normal vaginal mucosa, no evidence of prolapse.    Cervix: Grossly normal in appearance, no bleeding  Uterus: Non-enlarged, mobile, normal contour.  No CMT  Adnexa: ovaries non-enlarged, no adnexal masses  Rectal: deferred  Lymphatic: no evidence of inguinal lymphadenopathy Extremities: no edema, erythema, or tenderness Neurologic: Grossly intact Psychiatric: mood appropriate, affect full  Female chaperone present for pelvic and breast  portions of the physical exam    Assessment: 28 y.o. G0P0000 routine annual exam  Plan: Problem List Items Addressed This Visit    None    Visit Diagnoses    Encounter for gynecological examination without abnormal finding    -  Primary   Breast screening          2) STI screening  was not offered and therefore not obtained  2)  ASCCP guidelines  and rational discussed.  Patient opts for every 3 years screening interval  3) Contraception - N/A married in same sex relationship.  S  4) Routine healthcare maintenance including cholesterol, diabetes screening discussed managed by PCP  5) Return in about 1 year (around 03/13/2020) for annual.   Malachy Mood, MD, [place], [facility] 03/14/2019, 1:01 PM

## 2019-03-17 ENCOUNTER — Other Ambulatory Visit: Payer: Self-pay | Admitting: Obstetrics and Gynecology

## 2019-03-17 ENCOUNTER — Telehealth: Payer: Self-pay | Admitting: Obstetrics and Gynecology

## 2019-03-17 MED ORDER — SERTRALINE HCL 50 MG PO TABS: 50.0000 mg | ORAL_TABLET | Freq: Every day | ORAL | 2 refills | Status: DC

## 2019-03-17 NOTE — Telephone Encounter (Signed)
Telephone medication follow up 2 weeks

## 2019-03-17 NOTE — Telephone Encounter (Signed)
Called and left voicemail for patient to call back to be schedule. Mychart message sent.

## 2019-03-17 NOTE — Progress Notes (Signed)
GAD-7 15 PHQ-9 15 start zoloft

## 2019-03-17 NOTE — Telephone Encounter (Signed)
Called and left voicemail to call back to be schedule for 2 week teleavisit medication follow up with [facility]

## 2019-04-01 ENCOUNTER — Other Ambulatory Visit: Payer: Self-pay

## 2019-04-01 ENCOUNTER — Ambulatory Visit (INDEPENDENT_AMBULATORY_CARE_PROVIDER_SITE_OTHER): Payer: 59 | Admitting: Obstetrics and Gynecology

## 2019-04-01 ENCOUNTER — Encounter: Payer: Self-pay | Admitting: Obstetrics and Gynecology

## 2019-04-01 DIAGNOSIS — F329 Major depressive disorder, single episode, unspecified: Secondary | ICD-10-CM | POA: Diagnosis not present

## 2019-04-01 DIAGNOSIS — F32A Depression, unspecified: Secondary | ICD-10-CM

## 2019-04-01 DIAGNOSIS — F419 Anxiety disorder, unspecified: Secondary | ICD-10-CM | POA: Diagnosis not present

## 2019-04-01 NOTE — Progress Notes (Signed)
I connected with Nicole Rodriguez on 04/02/19 at 11:30 AM EDT by telephone and verified that I am speaking with the correct person using two identifiers.   I discussed the limitations, risks, security and privacy concerns of performing an evaluation and management service by telephone and the availability of in person appointments. I also discussed with the patient that there may be a patient responsible charge related to this service. The patient expressed understanding and agreed to proceed.  The patient was at home I spoke with the patient from my workstation phone The names of people involved in this encounter were: Nicole Rodriguez , and [facility]   Chief Complaint:  Chief Complaint  Patient presents with  . Follow-up    Medication anxiety/depression    History of Present Illness: The patient is a 28 y.o. female presenting follow up for symptoms of anxiety and depression.  The patient is currently taking Zoloft 50mg  for the management of her symptoms.  She has not had any recent situational stressors.  She reports complete resolution of symptoms other thanf decreased appetite.  She denies anhedonia, day time somnolence, insomnia, risk taking behavior, irritability, increased appetite, social anxiety, agorophobia, feelings of guilt, feelings of worthlessness, suicidal ideation, homicidal ideation, auditory hallucinations and visual hallucinations. Symptoms have improved since last visit.     Initially noted significant nausea on starting that has subsided as she has continued on the Zoloft.  Review of Systems: Review of Systems  Constitutional: Negative.   Gastrointestinal: Negative for nausea.  Neurological: Negative for headaches.  Psychiatric/Behavioral: Negative.      Past Medical History:  Past Medical History:  Diagnosis Date  . Asthma     Past Surgical History:  Past Surgical History:  Procedure Laterality Date  .  APPENDECTOMY    . BREAST LUMPECTOMY    . FOOT SURGERY      Gynecologic History: Patient's last menstrual period was 03/26/2019 (exact date).  Obstetric History: G0P0000  Family History:  Family History  Problem Relation Age of Onset  . Asthma Father   . Diabetes Other   . Heart failure Other     Social History:  Social History   Socioeconomic History  . Marital status: Married    Spouse name: Not on file  . Number of children: Not on file  . Years of education: Not on file  . Highest education level: Not on file  Occupational History  . Not on file  Social Needs  . Financial resource strain: Not on file  . Food insecurity    Worry: Not on file    Inability: Not on file  . Transportation needs    Medical: Not on file    Non-medical: Not on file  Tobacco Use  . Smoking status: Never Smoker  . Smokeless tobacco: Never Used  Substance and Sexual Activity  . Alcohol use: Yes    Comment: socially   . Drug use: No  . Sexual activity: Yes    Birth control/protection: None  Lifestyle  . Physical activity    Days per week: Not on file    Minutes per session: Not on file  . Stress: Not on file  Relationships  . Social Herbalist on phone: Not on file    Gets together: Not on file    Attends religious service: Not on file    Active member of club or organization: Not on file    Attends  meetings of clubs or organizations: Not on file    Relationship status: Not on file  . Intimate partner violence    Fear of current or ex partner: Not on file    Emotionally abused: Not on file    Physically abused: Not on file    Forced sexual activity: Not on file  Other Topics Concern  . Not on file  Social History Narrative  . Not on file    Allergies:  Allergies  Allergen Reactions  . Cefoxitin Itching and Rash  . Amoxicillin Hives  . Asa [Aspirin] Hives  . Sulfa Antibiotics Hives    Medications: Prior to Admission medications   Medication Sig Start  Date End Date Taking? Authorizing Provider  albuterol (PROVENTIL) (2.5 MG/3ML) 0.083% nebulizer solution Take 2.5 mg by nebulization every 6 (six) hours as needed for wheezing or shortness of breath.    [provider]  Albuterol Sulfate (PROAIR RESPICLICK) 123XX123 (90 Base) MCG/ACT AEPB Inhale 1-2 puffs into the lungs every 6 (six) hours as needed (shortness of breath).    [provider]  ciclopirox (PENLAC) 8 % solution Apply topically at bedtime. Apply over nail and surrounding skin. Apply daily over previous coat. After seven (7) days, may remove with alcohol and continue cycle. 10/14/18   Trula Slade, DPM  fluticasone (FLONASE) 50 MCG/ACT nasal spray Place 1 spray into both nostrils daily. 02/28/19   Collene Gobble, MD  levocetirizine (XYZAL) 5 MG tablet Take 1 tablet (5 mg total) by mouth every evening. 12/26/18   Byrum, Rose Fillers, MD  LORazepam (ATIVAN) 0.5 MG tablet Take 0.5 mg by mouth every 8 (eight) hours.    [provider]  meloxicam (MOBIC) 15 MG tablet Take 1 tablet (15 mg total) by mouth daily. 11/07/18 11/07/19  Trula Slade, DPM  montelukast (SINGULAIR) 10 MG tablet Take 1 tablet (10 mg total) by mouth at bedtime. 01/21/19   Collene Gobble, MD  pantoprazole (PROTONIX) 40 MG tablet TAKE 1 TABLET(40 MG) BY MOUTH TWICE DAILY 01/20/19   Collene Gobble, MD  sertraline (ZOLOFT) 50 MG tablet Take 1 tablet (50 mg total) by mouth daily. 03/17/19 03/16/20  Malachy Mood, MD  SYMBICORT 160-4.5 MCG/ACT inhaler INHALE 2 PUFF PO BID 02/27/19   [provider]    Physical Exam Vitals: There were no vitals filed for this visit. Patient's last menstrual period was 03/26/2019 (exact date).  No physical exam as this was a remote telephone visit to promote social distancing during the current COVID-19 Pandemic   GAD 7 : Generalized Anxiety Score 04/01/2019  Nervous, Anxious, on Edge 0  Control/stop worrying 0  Worry too much - different things 0  Trouble  relaxing 0  Restless 0  Easily annoyed or irritable 0  Afraid - awful might happen 0  Total GAD 7 Score 0  Anxiety Difficulty Not difficult at all    Depression screen [facility] 2/9 04/01/2019  Decreased Interest 0  Down, Depressed, Hopeless 0  PHQ - 2 Score 0  Altered sleeping 0  Tired, decreased energy 0  Change in appetite 3  Feeling bad or failure about yourself  0  Trouble concentrating 0  Moving slowly or fidgety/restless 0  Suicidal thoughts 0  PHQ-9 Score 3  Difficult doing work/chores Not difficult at all    Depression screen [facility] 2/9 04/01/2019  Decreased Interest 0  Down, Depressed, Hopeless 0  PHQ - 2 Score 0  Altered sleeping 0  Tired, decreased energy  0  Change in appetite 3  Feeling bad or failure about yourself  0  Trouble concentrating 0  Moving slowly or fidgety/restless 0  Suicidal thoughts 0  PHQ-9 Score 3  Difficult doing work/chores Not difficult at all     Assessment: 28 y.o. G0P000  follow up anxiety and depression  Plan: Problem List Items Addressed This Visit    None    Visit Diagnoses    Anxiety and depression    -  Primary      1) Anxiety/Depression - continue Zoloft at 50mg , will check back in around Thanksgiving.  To represent earlier if relapse in symptoms.    2) Thyroid and B12 screen has not been obtained previously  3) Telephone time 5:32 min  4) Return in about 8 weeks (around 05/27/2019), or Medication follow up phone.    Malachy Mood, MD, [facility], [facility] 04/01/2019, 12:01 PM

## 2019-05-05 ENCOUNTER — Other Ambulatory Visit: Payer: Self-pay | Admitting: Emergency Medicine

## 2019-05-05 NOTE — Telephone Encounter (Signed)
Per chart, this was sent in this morning. Sent message to patient to make aware.

## 2019-05-27 ENCOUNTER — Ambulatory Visit: Payer: 59 | Admitting: Obstetrics and Gynecology

## 2019-05-28 ENCOUNTER — Ambulatory Visit: Payer: 59 | Admitting: Obstetrics and Gynecology

## 2019-06-02 ENCOUNTER — Other Ambulatory Visit: Payer: Self-pay | Admitting: Obstetrics and Gynecology

## 2019-06-02 MED ORDER — SERTRALINE HCL 100 MG PO TABS: 100.0000 mg | ORAL_TABLET | Freq: Every day | ORAL | 2 refills | Status: DC

## 2019-06-03 ENCOUNTER — Other Ambulatory Visit: Payer: Self-pay | Admitting: Obstetrics and Gynecology

## 2019-06-03 ENCOUNTER — Encounter: Payer: Self-pay | Admitting: Obstetrics and Gynecology

## 2019-06-03 DIAGNOSIS — F332 Major depressive disorder, recurrent severe without psychotic features: Secondary | ICD-10-CM

## 2019-06-03 DIAGNOSIS — F411 Generalized anxiety disorder: Secondary | ICD-10-CM

## 2019-06-03 MED ORDER — BUSPIRONE HCL 7.5 MG PO TABS: 7.5000 mg | ORAL_TABLET | Freq: Two times a day (BID) | ORAL | 2 refills | Status: DC

## 2019-06-03 NOTE — Progress Notes (Signed)
Patient reports worsening anxiety and anhedonia.  Currently on 50mg  of Zoloft, increased to 100mg  this week.  PHQ-9 is 15 GAD-97 is 16.  Patient request out of work for the next two weeks do to troubles concentrating and anhedonia.    Will also initiate referral to counseling.

## 2019-06-05 ENCOUNTER — Ambulatory Visit (INDEPENDENT_AMBULATORY_CARE_PROVIDER_SITE_OTHER): Payer: 59 | Admitting: Obstetrics and Gynecology

## 2019-06-05 ENCOUNTER — Other Ambulatory Visit: Payer: Self-pay

## 2019-06-05 DIAGNOSIS — F419 Anxiety disorder, unspecified: Secondary | ICD-10-CM | POA: Diagnosis not present

## 2019-06-05 DIAGNOSIS — F329 Major depressive disorder, single episode, unspecified: Secondary | ICD-10-CM | POA: Diagnosis not present

## 2019-06-05 DIAGNOSIS — F32A Depression, unspecified: Secondary | ICD-10-CM

## 2019-06-05 NOTE — Progress Notes (Signed)
I connected with Nicole Rodriguez on 06/05/19 at  8:10 AM EST by telephone and verified that I am speaking with the correct person using two identifiers.   I discussed the limitations, risks, security and privacy concerns of performing an evaluation and management service by telephone and the availability of in person appointments. I also discussed with the patient that there may be a patient responsible charge related to this service. The patient expressed understanding and agreed to proceed.  The patient was at home I spoke with the patient from my workstation phone The names of people involved in this encounter were: Nicole Rodriguez , and [facility] Visit   Chief Complaint:  Chief Complaint  Patient presents with  . Follow-up    Anxiety/depression    History of Present Illness: The patient is a 28 y.o. female presenting follow up for symptoms of anxiety and depression.  The patient is currently taking Zoloft 50mg  for the management of her symptoms.  She has not had any recent situational stressors.  She reports symptoms of anhedonia, day time somnolence, insomnia, irritability, social anxiety, agorophobia, feelings of guilt and feelings of worthlessness.  She denies risk taking behavior, suicidal ideation, homicidal ideation, auditory hallucinations and visual hallucinations. Symptoms have worsened since last visit.    The patient does have a pre-existing history of depression and anxiety.  She  does not a prior history of suicide attempts.    Review of Systems: Review of Systems  Constitutional: Negative for chills and fever.  HENT: Negative for congestion.   Respiratory: Negative for cough and shortness of breath.   Cardiovascular: Negative for chest pain and palpitations.  Gastrointestinal: Negative for abdominal pain, constipation, diarrhea, heartburn, nausea and vomiting.  Genitourinary: Negative for dysuria, frequency and urgency.  Skin:  Negative for itching and rash.  Neurological: Negative for dizziness and headaches.  Endo/Heme/Allergies: Negative for polydipsia.  Psychiatric/Behavioral: Negative for depression.    Past Medical History:  Past Medical History:  Diagnosis Date  . Asthma     Past Surgical History:  Past Surgical History:  Procedure Laterality Date  . APPENDECTOMY    . BREAST LUMPECTOMY    . FOOT SURGERY      Gynecologic History: No LMP recorded.  Obstetric History: G0P0000  Family History:  Family History  Problem Relation Age of Onset  . Asthma Father   . Diabetes Other   . Heart failure Other     Social History:  Social History   Socioeconomic History  . Marital status: Married    Spouse name: Not on file  . Number of children: Not on file  . Years of education: Not on file  . Highest education level: Not on file  Occupational History  . Not on file  Social Needs  . Financial resource strain: Not on file  . Food insecurity    Worry: Not on file    Inability: Not on file  . Transportation needs    Medical: Not on file    Non-medical: Not on file  Tobacco Use  . Smoking status: Never Smoker  . Smokeless tobacco: Never Used  Substance and Sexual Activity  . Alcohol use: Yes    Comment: socially   . Drug use: No  . Sexual activity: Yes    Birth control/protection: None  Lifestyle  . Physical activity    Days per week: Not on file    Minutes per session: Not on file  . Stress:  Not on file  Relationships  . Social Herbalist on phone: Not on file    Gets together: Not on file    Attends religious service: Not on file    Active member of club or organization: Not on file    Attends meetings of clubs or organizations: Not on file    Relationship status: Not on file  . Intimate partner violence    Fear of current or ex partner: Not on file    Emotionally abused: Not on file    Physically abused: Not on file    Forced sexual activity: Not on file  Other  Topics Concern  . Not on file  Social History Narrative  . Not on file    Allergies:  Allergies  Allergen Reactions  . Cefoxitin Itching and Rash  . Amoxicillin Hives  . Asa [Aspirin] Hives  . Sulfa Antibiotics Hives    Medications: Prior to Admission medications   Medication Sig Start Date End Date Taking? Authorizing Provider  albuterol (PROVENTIL) (2.5 MG/3ML) 0.083% nebulizer solution Take 2.5 mg by nebulization every 6 (six) hours as needed for wheezing or shortness of breath.   Yes [provider]  Albuterol Sulfate (PROAIR RESPICLICK) 123XX123 (90 Base) MCG/ACT AEPB Inhale 1-2 puffs into the lungs every 6 (six) hours as needed (shortness of breath).   Yes [provider]  busPIRone (BUSPAR) 7.5 MG tablet Take 1 tablet (7.5 mg total) by mouth 2 (two) times daily. 06/03/19  Yes Malachy Mood, MD  ciclopirox [facility]) 8 % solution Apply topically at bedtime. Apply over nail and surrounding skin. Apply daily over previous coat. After seven (7) days, may remove with alcohol and continue cycle. 10/14/18  Yes Trula Slade, DPM  fluticasone (FLONASE) 50 MCG/ACT nasal spray Place 1 spray into both nostrils daily. 02/28/19  Yes Collene Gobble, MD  levocetirizine (XYZAL) 5 MG tablet Take 1 tablet (5 mg total) by mouth every evening. 12/26/18  Yes Byrum, Rose Fillers, MD  LORazepam (ATIVAN) 0.5 MG tablet Take 0.5 mg by mouth every 8 (eight) hours.   Yes [provider]  meloxicam (MOBIC) 15 MG tablet Take 1 tablet (15 mg total) by mouth daily. 11/07/18 11/07/19 Yes Trula Slade, DPM  montelukast (SINGULAIR) 10 MG tablet TAKE 1 TABLET(10 MG) BY MOUTH AT BEDTIME 05/05/19  Yes Collene Gobble, MD  pantoprazole (PROTONIX) 40 MG tablet TAKE 1 TABLET(40 MG) BY MOUTH TWICE DAILY 01/20/19  Yes Collene Gobble, MD  sertraline (ZOLOFT) 100 MG tablet Take 1 tablet (100 mg total) by mouth daily. 06/02/19 06/01/20 Yes Malachy Mood, MD  SYMBICORT 160-4.5 MCG/ACT inhaler INHALE  2 PUFF PO BID 02/27/19  Yes [provider]    Physical Exam Vitals: There were no vitals filed for this visit. No LMP recorded.  No physical exam as this was a remote telephone visit to promote social distancing during the current COVID-19 Pandemic  GAD 7 : Generalized Anxiety Score 06/05/2019 04/01/2019  Nervous, Anxious, on Edge 3 0  Control/stop worrying 3 0  Worry too much - different things 3 0  Trouble relaxing 3 0  Restless 1 0  Easily annoyed or irritable 3 0  Afraid - awful might happen 3 0  Total GAD 7 Score 19 0  Anxiety Difficulty Very difficult Not difficult at all    Depression screen [facility] 2/9 06/05/2019 04/01/2019  Decreased Interest 3 0  Down, Depressed, Hopeless 3 0  PHQ - 2 Score  6 0  Altered sleeping 3 0  Tired, decreased energy 3 0  Change in appetite 3 3  Feeling bad or failure about yourself  3 0  Trouble concentrating 0 0  Moving slowly or fidgety/restless 0 0  Suicidal thoughts 1 0  PHQ-9 Score 19 3  Difficult doing work/chores Very difficult Not difficult at all    Depression screen [facility] 2/9 06/05/2019 04/01/2019  Decreased Interest 3 0  Down, Depressed, Hopeless 3 0  PHQ - 2 Score 6 0  Altered sleeping 3 0  Tired, decreased energy 3 0  Change in appetite 3 3  Feeling bad or failure about yourself  3 0  Trouble concentrating 0 0  Moving slowly or fidgety/restless 0 0  Suicidal thoughts 1 0  PHQ-9 Score 19 3  Difficult doing work/chores Very difficult Not difficult at all   Assessment: 28 y.o. G0P0000 follow up anxiety and depression  Plan: Problem List Items Addressed This Visit    None    Visit Diagnoses    Anxiety and depression    -  Primary      1) Zoloft has been increased to 100mg , addition of Buspar 7.5mg  po bid.  Written out of work for next 2 weeks.  Also referral to psychology has previously been initiated  2) Thyroid and B12 screen has not been obtained previously  3)  Telephone time 16:02 minutes  4) Return in  about 4 weeks (around 07/03/2019) for medication follow up.    Malachy Mood, MD, [facility], [facility] 06/05/2019, 9:08 AM

## 2019-06-18 ENCOUNTER — Other Ambulatory Visit: Payer: Self-pay

## 2019-06-18 ENCOUNTER — Encounter: Payer: Self-pay | Admitting: Obstetrics and Gynecology

## 2019-06-18 ENCOUNTER — Ambulatory Visit (INDEPENDENT_AMBULATORY_CARE_PROVIDER_SITE_OTHER): Payer: 59 | Admitting: Obstetrics and Gynecology

## 2019-06-18 VITALS — Ht 63.0 in | Wt 174.0 lb

## 2019-06-18 DIAGNOSIS — F419 Anxiety disorder, unspecified: Secondary | ICD-10-CM

## 2019-06-18 DIAGNOSIS — F329 Major depressive disorder, single episode, unspecified: Secondary | ICD-10-CM

## 2019-06-18 DIAGNOSIS — F32A Depression, unspecified: Secondary | ICD-10-CM

## 2019-06-18 MED ORDER — BUPROPION HCL ER (XL) 150 MG PO TB24: 150.0000 mg | ORAL_TABLET | Freq: Every day | ORAL | 2 refills | Status: DC

## 2019-06-18 NOTE — Progress Notes (Signed)
I connected with Nicole Rodriguez on 06/18/19 at 11:10 AM EST by telephone and verified that I am speaking with the correct person using two identifiers.   I discussed the limitations, risks, security and privacy concerns of performing an evaluation and management service by telephone and the availability of in person appointments. I also discussed with the patient that there may be a patient responsible charge related to this service. The patient expressed understanding and agreed to proceed.  The patient was at home I spoke with the patient from my workstation phone The names of people involved in this encounter were: Nicole Rodriguez , and [facility] Visit   Chief Complaint:  Chief Complaint  Patient presents with  . Follow-up    Anxiety/depression    History of Present Illness: The patient is a 28 y.o. female presenting follow up for symptoms of anxiety and depression.  The patient is currently taking Zoloft 100mg  po daily and buspar 7.5mg  po bidfor the management of her symptoms.  She has not had any recent situational stressors.  She reports symptoms of anhedonia, day time somnolence, insomnia, irritability, social anxiety, agorophobia and visual hallucinations.  She denies risk taking behavior, suicidal ideation, homicidal ideation, auditory hallucinations and visual hallucinations. Symptoms have remained unchanged since last visit.     The patient does have a pre-existing history of depression and anxiety.  She  does not a prior history of suicide attempts.    Review of Systems: Review of Systems  Constitutional: Negative.   Gastrointestinal: Negative for nausea.  Neurological: Negative for headaches.  Psychiatric/Behavioral: Positive for depression. Negative for hallucinations, substance abuse and suicidal ideas. The patient is nervous/anxious and has insomnia.      Past Medical History:  Past Medical History:  Diagnosis Date  .  Asthma     Past Surgical History:  Past Surgical History:  Procedure Laterality Date  . APPENDECTOMY    . BREAST LUMPECTOMY    . FOOT SURGERY      Gynecologic History: Patient's last menstrual period was 05/19/2019.  Obstetric History: G0P0000  Family History:  Family History  Problem Relation Age of Onset  . Asthma Father   . Diabetes Other   . Heart failure Other     Social History:  Social History   Socioeconomic History  . Marital status: Married    Spouse name: Not on file  . Number of children: Not on file  . Years of education: Not on file  . Highest education level: Not on file  Occupational History  . Not on file  Tobacco Use  . Smoking status: Never Smoker  . Smokeless tobacco: Never Used  Substance and Sexual Activity  . Alcohol use: Yes    Comment: socially   . Drug use: No  . Sexual activity: Yes    Birth control/protection: None  Other Topics Concern  . Not on file  Social History Narrative  . Not on file   Social Determinants of Health   Financial Resource Strain:   . Difficulty of Paying Living Expenses: Not on file  Food Insecurity:   . Worried About Charity fundraiser in the Last Year: Not on file  . Ran Out of Food in the Last Year: Not on file  Transportation Needs:   . Lack of Transportation (Medical): Not on file  . Lack of Transportation (Non-Medical): Not on file  Physical Activity:   . Days of Exercise per Week: Not on  file  . Minutes of Exercise per Session: Not on file  Stress:   . Feeling of Stress : Not on file  Social Connections:   . Frequency of Communication with Friends and Family: Not on file  . Frequency of Social Gatherings with Friends and Family: Not on file  . Attends Religious Services: Not on file  . Active Member of Clubs or Organizations: Not on file  . Attends Archivist Meetings: Not on file  . Marital Status: Not on file  Intimate Partner Violence:   . Fear of Current or Ex-Partner: Not on  file  . Emotionally Abused: Not on file  . Physically Abused: Not on file  . Sexually Abused: Not on file    Allergies:  Allergies  Allergen Reactions  . Cefoxitin Itching and Rash  . Amoxicillin Hives  . Asa [Aspirin] Hives  . Sulfa Antibiotics Hives    Medications: Prior to Admission medications   Medication Sig Start Date End Date Taking? Authorizing Provider  albuterol (PROVENTIL) (2.5 MG/3ML) 0.083% nebulizer solution Take 2.5 mg by nebulization every 6 (six) hours as needed for wheezing or shortness of breath.   Yes [provider]  Albuterol Sulfate (PROAIR RESPICLICK) 123XX123 (90 Base) MCG/ACT AEPB Inhale 1-2 puffs into the lungs every 6 (six) hours as needed (shortness of breath).   Yes [provider]  busPIRone (BUSPAR) 7.5 MG tablet Take 1 tablet (7.5 mg total) by mouth 2 (two) times daily. 06/03/19  Yes Malachy Mood, MD  ciclopirox [facility]) 8 % solution Apply topically at bedtime. Apply over nail and surrounding skin. Apply daily over previous coat. After seven (7) days, may remove with alcohol and continue cycle. 10/14/18  Yes Trula Slade, DPM  fluticasone (FLONASE) 50 MCG/ACT nasal spray Place 1 spray into both nostrils daily. 02/28/19  Yes Collene Gobble, MD  levocetirizine (XYZAL) 5 MG tablet Take 1 tablet (5 mg total) by mouth every evening. 12/26/18  Yes Byrum, Rose Fillers, MD  LORazepam (ATIVAN) 0.5 MG tablet Take 0.5 mg by mouth every 8 (eight) hours.   Yes [provider]  meloxicam (MOBIC) 15 MG tablet Take 1 tablet (15 mg total) by mouth daily. 11/07/18 11/07/19 Yes Trula Slade, DPM  montelukast (SINGULAIR) 10 MG tablet TAKE 1 TABLET(10 MG) BY MOUTH AT BEDTIME 05/05/19  Yes Collene Gobble, MD  pantoprazole (PROTONIX) 40 MG tablet TAKE 1 TABLET(40 MG) BY MOUTH TWICE DAILY 01/20/19  Yes Collene Gobble, MD  sertraline (ZOLOFT) 100 MG tablet Take 1 tablet (100 mg total) by mouth daily. 06/02/19 06/01/20 Yes Malachy Mood, MD   SYMBICORT 160-4.5 MCG/ACT inhaler INHALE 2 PUFF PO BID 02/27/19  Yes [provider]    Physical Exam Vitals: There were no vitals filed for this visit. Patient's last menstrual period was 05/19/2019.  No physical exam as this was a remote telephone visit to promote social distancing during the current COVID-19 Pandemic   GAD 7 : Generalized Anxiety Score 06/18/2019 06/05/2019 04/01/2019  Nervous, Anxious, on Edge 3 3 0  Control/stop worrying 3 3 0  Worry too much - different things 3 3 0  Trouble relaxing 3 3 0  Restless 2 1 0  Easily annoyed or irritable 3 3 0  Afraid - awful might happen 3 3 0  Total GAD 7 Score 20 19 0  Anxiety Difficulty Very difficult Very difficult Not difficult at all    Depression screen [facility] 2/9 06/18/2019 06/05/2019 04/01/2019  Decreased Interest  3 3 0  Down, Depressed, Hopeless 2 3 0  PHQ - 2 Score 5 6 0  Altered sleeping 3 3 0  Tired, decreased energy 3 3 0  Change in appetite 3 3 3   Feeling bad or failure about yourself  3 3 0  Trouble concentrating 1 0 0  Moving slowly or fidgety/restless 0 0 0  Suicidal thoughts 2 1 0  PHQ-9 Score 20 19 3   Difficult doing work/chores Very difficult Very difficult Not difficult at all    Depression screen [facility] 2/9 06/18/2019 06/05/2019 04/01/2019  Decreased Interest 3 3 0  Down, Depressed, Hopeless 2 3 0  PHQ - 2 Score 5 6 0  Altered sleeping 3 3 0  Tired, decreased energy 3 3 0  Change in appetite 3 3 3   Feeling bad or failure about yourself  3 3 0  Trouble concentrating 1 0 0  Moving slowly or fidgety/restless 0 0 0  Suicidal thoughts 2 1 0  PHQ-9 Score 20 19 3   Difficult doing work/chores Very difficult Very difficult Not difficult at all     Assessment: 28 y.o. G0P0000 follow up anxiety and depression  Plan: Problem List Items Addressed This Visit    None    Visit Diagnoses    Anxiety and depression    -  Primary   Relevant Medications   buPROPion (WELLBUTRIN XL) 150 MG 24 hr tablet       1) Switch Wellbutrin XL 150mg , discontinue Zoloft 100mg  as no improvement with dose increase.  Continue Buspar  2) Thyroid and B12 screen has not been obtained previously  3) Telephone 10:10  4) Return in about 1 year (around 06/17/2020) for medication.   Malachy Mood, MD, [facility], [facility] 06/18/2019, 12:02 PM

## 2019-06-30 ENCOUNTER — Other Ambulatory Visit: Payer: Self-pay | Admitting: Emergency Medicine

## 2019-07-01 ENCOUNTER — Other Ambulatory Visit: Payer: Self-pay | Admitting: Obstetrics and Gynecology

## 2019-07-01 DIAGNOSIS — F322 Major depressive disorder, single episode, severe without psychotic features: Secondary | ICD-10-CM

## 2019-07-01 MED ORDER — BUPROPION HCL ER (XL) 300 MG PO TB24: 300.0000 mg | ORAL_TABLET | Freq: Every day | ORAL | 2 refills | Status: DC

## 2019-07-01 MED ORDER — LORAZEPAM 0.5 MG PO TABS: 0.5000 mg | ORAL_TABLET | Freq: Two times a day (BID) | ORAL | 0 refills | Status: DC | PRN

## 2019-07-09 ENCOUNTER — Ambulatory Visit: Payer: 59 | Admitting: Obstetrics and Gynecology

## 2019-07-09 ENCOUNTER — Other Ambulatory Visit: Payer: Self-pay

## 2019-07-09 NOTE — Progress Notes (Deleted)
Obstetrics & Gynecology Office Visit   Chief Complaint: No chief complaint on file.   History of Present Illness: The patient is a 29 y.o. female presenting {Blank single:19197::"follow up","initial evaluation"} for symptoms of {Blank single:19197::"depression","anxiety","anxiety and depression"}.  The patient is currently taking *** for the management of her symptoms.  She {HAS HAS [id number] had any recent situational stressors.  She reports symptoms of {Blank multiple:19196::"anhedonia","day time somnolence","insomnia","risk taking behavior","irritability","increased appetite","decreased appetite","social anxiety","agorophobia","feelings of guilt","feelings of worthlessness","suicidal ideation","homicidal ideation","auditory hallucinations","visual hallucinations"}.  She denies {Blank multiple:19196::"anhedonia","day time somnolence","insomnia","risk taking behavior","irritability","increased appetite","decreased appetite","social anxiety","agorophobia","feelings of guilt","feelings of worthlessness","suicidal ideation","homicidal ideation","auditory hallucinations","visual hallucinations"}. Symptoms have {Blank single:19197::"improved","remained unchanged","worsened"} since {Blank single:19197::""initial onset ***","last visit"}.     The patient {DOES_DOES [id number] have a pre-existing history of depression and anxiety.  She  {DOES_DOES [id number] a prior history of suicide attempts.  Previous treatment tied include {Blank  multiple:19196::Zoloft","Lexapro","Celexa","Prozac","Welbutrin","Buspirone","Benzodiazapines","CBT","Counseling",'***;.  Review of Systems: ***  Past Medical History:  Past Medical History:  Diagnosis Date  . Asthma     Past Surgical History:  Past Surgical History:  Procedure Laterality Date  . APPENDECTOMY    . BREAST LUMPECTOMY    . FOOT SURGERY      Gynecologic History: No LMP recorded.  Obstetric History: G0P0000  Family History:  Family History    Problem Relation Age of Onset  . Asthma Father   . Diabetes Other   . Heart failure Other     Social History:  Social History   Socioeconomic History  . Marital status: Married    Spouse name: Not on file  . Number of children: Not on file  . Years of education: Not on file  . Highest education level: Not on file  Occupational History  . Not on file  Tobacco Use  . Smoking status: Never Smoker  . Smokeless tobacco: Never Used  Substance and Sexual Activity  . Alcohol use: Yes    Comment: socially   . Drug use: No  . Sexual activity: Yes    Birth control/protection: None  Other Topics Concern  . Not on file  Social History Narrative  . Not on file   Social Determinants of Health   Financial Resource Strain:   . Difficulty of Paying Living Expenses: Not on file  Food Insecurity:   . Worried About Charity fundraiser in the Last Year: Not on file  . Ran Out of Food in the Last Year: Not on file  Transportation Needs:   . Lack of Transportation (Medical): Not on file  . Lack of Transportation (Non-Medical): Not on file  Physical Activity:   . Days of Exercise per Week: Not on file  . Minutes of Exercise per Session: Not on file  Stress:   . Feeling of Stress : Not on file  Social Connections:   . Frequency of Communication with Friends and Family: Not on file  . Frequency of Social Gatherings with Friends and Family: Not on file  . Attends Religious Services: Not on file  . Active Member of Clubs or Organizations: Not on file  . Attends Archivist Meetings: Not on file  . Marital Status: Not on file  Intimate Partner Violence:   . Fear of Current or Ex-Partner: Not on file  . Emotionally Abused: Not on file  . Physically Abused: Not on file  . Sexually Abused: Not on file    Allergies:  Allergies  Allergen Reactions  . Cefoxitin Itching and Rash  . Amoxicillin Hives  .  Asa [Aspirin] Hives  . Sulfa Antibiotics Hives    Medications: Prior to  Admission medications   Medication Sig Start Date End Date Taking? Authorizing Provider  albuterol (PROVENTIL) (2.5 MG/3ML) 0.083% nebulizer solution Take 2.5 mg by nebulization every 6 (six) hours as needed for wheezing or shortness of breath.    [provider]  Albuterol Sulfate (PROAIR RESPICLICK) 123XX123 (90 Base) MCG/ACT AEPB Inhale 1-2 puffs into the lungs every 6 (six) hours as needed (shortness of breath).    [provider]  buPROPion (WELLBUTRIN XL) 300 MG 24 hr tablet Take 1 tablet (300 mg total) by mouth daily. 07/01/19   Malachy Mood, MD  busPIRone (BUSPAR) 7.5 MG tablet Take 1 tablet (7.5 mg total) by mouth 2 (two) times daily. 06/03/19   Malachy Mood, MD  ciclopirox (PENLAC) 8 % solution Apply topically at bedtime. Apply over nail and surrounding skin. Apply daily over previous coat. After seven (7) days, may remove with alcohol and continue cycle. 10/14/18   Trula Slade, DPM  fluticasone (FLONASE) 50 MCG/ACT nasal spray Place 1 spray into both nostrils daily. 02/28/19   Collene Gobble, MD  levocetirizine (XYZAL) 5 MG tablet TAKE 1 TABLET(5 MG) BY MOUTH EVERY EVENING 06/30/19   Byrum, Rose Fillers, MD  LORazepam (ATIVAN) 0.5 MG tablet Take 1 tablet (0.5 mg total) by mouth 2 (two) times daily as needed for anxiety. 07/01/19   Malachy Mood, MD  meloxicam (MOBIC) 15 MG tablet Take 1 tablet (15 mg total) by mouth daily. 11/07/18 11/07/19  Trula Slade, DPM  montelukast (SINGULAIR) 10 MG tablet TAKE 1 TABLET(10 MG) BY MOUTH AT BEDTIME 05/05/19   Collene Gobble, MD  pantoprazole (PROTONIX) 40 MG tablet TAKE 1 TABLET(40 MG) BY MOUTH TWICE DAILY 01/20/19   Collene Gobble, MD  SYMBICORT 160-4.5 MCG/ACT inhaler INHALE 2 PUFF PO BID 02/27/19   [provider]    Physical Exam Vitals: There were no vitals filed for this visit. No LMP recorded.  No physical exam as this was a remote telephone visit to promote social distancing during the current COVID-19  Pandemic    GAD 7 : Generalized Anxiety Score 06/18/2019 06/05/2019 04/01/2019  Nervous, Anxious, on Edge 3 3 0  Control/stop worrying 3 3 0  Worry too much - different things 3 3 0  Trouble relaxing 3 3 0  Restless 2 1 0  Easily annoyed or irritable 3 3 0  Afraid - awful might happen 3 3 0  Total GAD 7 Score 20 19 0  Anxiety Difficulty Very difficult Very difficult Not difficult at all    Depression screen [facility] 2/9 06/18/2019 06/05/2019 04/01/2019  Decreased Interest 3 3 0  Down, Depressed, Hopeless 2 3 0  PHQ - 2 Score 5 6 0  Altered sleeping 3 3 0  Tired, decreased energy 3 3 0  Change in appetite 3 3 3   Feeling bad or failure about yourself  3 3 0  Trouble concentrating 1 0 0  Moving slowly or fidgety/restless 0 0 0  Suicidal thoughts 2 1 0  PHQ-9 Score 20 19 3   Difficult doing work/chores Very difficult Very difficult Not difficult at all    Depression screen [facility] 2/9 06/18/2019 06/05/2019 04/01/2019  Decreased Interest 3 3 0  Down, Depressed, Hopeless 2 3 0  PHQ - 2 Score 5 6 0  Altered sleeping 3 3 0  Tired, decreased energy 3 3 0  Change in appetite 3 3 3  Feeling bad or failure about yourself  3 3 0  Trouble concentrating 1 0 0  Moving slowly or fidgety/restless 0 0 0  Suicidal thoughts 2 1 0  PHQ-9 Score 20 19 3   Difficult doing work/chores Very difficult Very difficult Not difficult at all     Assessment: 29 y.o. G0P0000***  Plan: Problem List Items Addressed This Visit    None      1) ***  2) Thyroid and B12 screen {HAS/HAS NOT:20194} been obtained previously   Malachy Mood, MD, [place], [facility] 07/09/2019, 8:31 AM

## 2019-07-11 ENCOUNTER — Other Ambulatory Visit: Payer: Self-pay | Admitting: Emergency Medicine

## 2019-07-12 ENCOUNTER — Ambulatory Visit (HOSPITAL_COMMUNITY): Payer: Self-pay | Admitting: Psychiatry

## 2019-07-17 ENCOUNTER — Other Ambulatory Visit: Payer: Self-pay

## 2019-07-17 ENCOUNTER — Ambulatory Visit: Payer: 59 | Attending: Internal Medicine

## 2019-07-17 DIAGNOSIS — Z20822 Contact with and (suspected) exposure to covid-19: Secondary | ICD-10-CM | POA: Insufficient documentation

## 2019-07-18 LAB — NOVEL CORONAVIRUS, NAA: SARS-CoV-2, NAA: NOT DETECTED

## 2019-07-19 ENCOUNTER — Encounter (HOSPITAL_COMMUNITY): Payer: Self-pay | Admitting: Psychiatry

## 2019-07-19 ENCOUNTER — Other Ambulatory Visit: Payer: Self-pay

## 2019-07-19 ENCOUNTER — Ambulatory Visit (INDEPENDENT_AMBULATORY_CARE_PROVIDER_SITE_OTHER): Payer: 59 | Admitting: Psychiatry

## 2019-07-19 DIAGNOSIS — F3181 Bipolar II disorder: Secondary | ICD-10-CM | POA: Diagnosis not present

## 2019-07-19 DIAGNOSIS — F4010 Social phobia, unspecified: Secondary | ICD-10-CM | POA: Insufficient documentation

## 2019-07-19 DIAGNOSIS — F410 Panic disorder [episodic paroxysmal anxiety] without agoraphobia: Secondary | ICD-10-CM | POA: Diagnosis not present

## 2019-07-19 DIAGNOSIS — F411 Generalized anxiety disorder: Secondary | ICD-10-CM | POA: Diagnosis not present

## 2019-07-19 MED ORDER — LURASIDONE HCL 20 MG PO TABS: 20.0000 mg | ORAL_TABLET | Freq: Every day | ORAL | 0 refills | Status: DC

## 2019-07-19 MED ORDER — TRAZODONE HCL 50 MG PO TABS: 50.0000 mg | ORAL_TABLET | Freq: Every evening | ORAL | 0 refills | Status: DC | PRN

## 2019-07-19 MED ORDER — LAMOTRIGINE 25 MG PO TABS: ORAL_TABLET | ORAL | 0 refills | Status: DC

## 2019-07-19 MED ORDER — BUSPIRONE HCL 15 MG PO TABS: 15.0000 mg | ORAL_TABLET | Freq: Two times a day (BID) | ORAL | 1 refills | Status: DC
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
## 2019-07-24 ENCOUNTER — Telehealth (HOSPITAL_COMMUNITY): Payer: Self-pay | Admitting: Psychiatry

## 2019-07-24 ENCOUNTER — Telehealth (HOSPITAL_COMMUNITY): Payer: Self-pay | Admitting: Professional

## 2019-07-24 ENCOUNTER — Other Ambulatory Visit (HOSPITAL_COMMUNITY): Payer: 59 | Attending: Psychiatry | Admitting: Professional

## 2019-07-24 ENCOUNTER — Other Ambulatory Visit: Payer: Self-pay

## 2019-07-24 DIAGNOSIS — F3181 Bipolar II disorder: Secondary | ICD-10-CM

## 2019-07-24 NOTE — Telephone Encounter (Signed)
D:  Pt referred to [facility] by Loistine Chance, LPCA.  A:  Placed call to orient and provide pt with a start date, but there was no answer.  Left vm for patient to call case manager back.

## 2019-07-25 ENCOUNTER — Telehealth (HOSPITAL_COMMUNITY): Payer: Self-pay | Admitting: Psychiatry
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
## 2019-07-25 NOTE — Telephone Encounter (Signed)
D:  Phoned pt to orient and answer questions.  Pt states she can't start MH-IOP on 07-28-19 d/t childcare issues (ie. Keeping infant niece).  Pt rescheduled to start on 08-04-19 @ 9 a.m.

## 2019-07-29 ENCOUNTER — Other Ambulatory Visit: Payer: Self-pay | Admitting: Obstetrics and Gynecology

## 2019-07-29 MED ORDER — DOXYCYCLINE HYCLATE 100 MG PO CAPS: 100.0000 mg | ORAL_CAPSULE | Freq: Two times a day (BID) | ORAL | 0 refills | Status: DC

## 2019-07-29 NOTE — Progress Notes (Signed)
Sinusitis symptoms no fever,  Rx doxycyline given PCN allergy.  Given handout on covid testing sites should she develop additional symptoms besides rhinorrhea

## 2019-08-04 ENCOUNTER — Other Ambulatory Visit: Payer: Self-pay

## 2019-08-04 ENCOUNTER — Other Ambulatory Visit (HOSPITAL_COMMUNITY): Payer: 59 | Attending: Family | Admitting: Psychiatry

## 2019-08-04 ENCOUNTER — Encounter (HOSPITAL_COMMUNITY): Payer: Self-pay | Admitting: Psychiatry

## 2019-08-04 DIAGNOSIS — F410 Panic disorder [episodic paroxysmal anxiety] without agoraphobia: Secondary | ICD-10-CM | POA: Insufficient documentation

## 2019-08-04 DIAGNOSIS — G479 Sleep disorder, unspecified: Secondary | ICD-10-CM | POA: Diagnosis not present

## 2019-08-04 DIAGNOSIS — J45909 Unspecified asthma, uncomplicated: Secondary | ICD-10-CM | POA: Diagnosis not present

## 2019-08-04 DIAGNOSIS — R4584 Anhedonia: Secondary | ICD-10-CM | POA: Insufficient documentation

## 2019-08-04 DIAGNOSIS — Z791 Long term (current) use of non-steroidal anti-inflammatories (NSAID): Secondary | ICD-10-CM | POA: Diagnosis not present

## 2019-08-04 DIAGNOSIS — Z79899 Other long term (current) drug therapy: Secondary | ICD-10-CM | POA: Insufficient documentation

## 2019-08-04 DIAGNOSIS — F3181 Bipolar II disorder: Secondary | ICD-10-CM | POA: Diagnosis not present

## 2019-08-04 DIAGNOSIS — F419 Anxiety disorder, unspecified: Secondary | ICD-10-CM | POA: Insufficient documentation

## 2019-08-04 NOTE — Progress Notes (Signed)
Patient ID: Nicole Rodriguez, female   DOB: January 08, 1991, 29 y.o.   MRN: [id number] Virtual Visit via Telephone Note  I connected with Nicole Rodriguez on @TODAY @ at  9:00 AM EST by telephone and verified that I am speaking with the correct person using two identifiers. I discussed the limitations, risks, security and privacy concerns of performing an evaluation and management service by telephone and the availability of in person appointments. I also discussed with the patient that there may be a patient responsible charge related to this service. The patient expressed understanding and agreed to proceed. I discussed the assessment and treatment plan with the patient. The patient was provided an opportunity to ask questions and all were answered. The patient agreed with the plan and demonstrated an understanding of the instructions.   The patient was advised to call back or seek an in-person evaluation if the symptoms worsen or if the condition fails to improve as anticipated.  I provided 15 minutes of non-face-to-face time during this encounter.  Per previous CCA on 07-24-19:   Pt reports to Nicole Rodriguez as referral from Nicole Rodriguez. Pt reports she has recently struggled with increased depression and anxiety. Pt was written out of work as of 06/03/19 due to symptoms. Pt started treatment with medications for anxiety in 2016 by OBGYN. Pt started meds for Depression in Sept 2020. Pt has not had success with medications. Pt saw Nicole Rodriguez on 1/16 and was prescribed new medications. Pt denies any counseling treatment; pt is scheduled with Nicole Rodriguez on 2/3 for first apt. Pt reports new meds are already helping. Pt denies any previous attempts or hospitalizations; pt endorses passive SI in recent past; pt denies since starting new medications. Pt denies HI/AVH. Pt denies self-harm hx. Pt states she picks at her fingers when anxious which causes fingers to hurt. Pt is unable to identify specific stressors that may have  contributed to increase in depression/anxiety. Pt reports she was in college and had to take a semester off this year due to symptoms. Pt has good support in wife of 5 years, in-laws, mother, and 61yo daughter.  Pt started MH-IOP today.  She couldn't start last Monday due to childcare issues: A:  Oriented pt to virtual MH-IOP.  Answered all questions.  Pt gave verbal consent for treatment to release chart information to referred providers and to complete any forms if needed.  Pt also gave consent for attending group virtually d/t COVID-19 social distancing restrictions.  Encouraged support groups.  Strongly recommend pt to contact list of providers she was given by PHP.  F/U with Nicole Flood, LCSW.  Reiterated group commitments R:  Patient receptive.  Nicole Rodriguez, M.Ed,CNA

## 2019-08-04 NOTE — Progress Notes (Signed)
Virtual Visit via Video Note  I connected with Nicole Rodriguez on 08/04/19 at  9:00 AM EST by a video enabled telemedicine application and verified that I am speaking with the correct person using two identifiers.  Location: Patient: Patient Home Provider: Home Office  Case Manger discussed the limitations of evaluation and management by telemedicine and the availability of in person appointments during orientation. The patient expressed understanding and agreed to proceed.   History of Present Illness: Bipolar 2 DO   Observations/Objective: Case Manager checked in with all participants to review discharge dates, insurance authorizations, work-related documents and needs for the treatment team. Counselor processed current mood and functioning and discussed how participants spent their time since last session and if skills were applied. Patient participated in her first IOP treatment session, sharing about her need for treatment, goals for treatment, life stressors and about her support system. Nicole Rodriguez shared that she is experiencing intense depression and anxiety, causing her to have loss of interest, lack of drive and motivation. She identified that her source of stress is related to childhood traumas and would like to work to [employer] of those experiences. She would like to work on stabilization of medication management and develop healthier coping skills. Nicole Rodriguez presents with severe anxiety and moderate depression.   Counselor provided psychoeducation on Cognitive Distortions, sharing terms, concepts, application and impact, reviewing a video, worksheets and assessments for open discussion within the group. Patient engaged in activities and discussion, sharing that she related most to black and white thinking and blaming. She connected this to how she was treated and viewed in her childhood and how it has impacted her self-esteem and aggressiveness towards others.  Counselor assigned  reflection homework to group members for further application and processing.   Counselor ended session by acknowledging a graduating group member by prompting graduating member to reflect on progress made, takeaways from treatment and plan for stepping down. Counselor and group members shared observations of growth, encouragement and support as she transitions out of the program.   Assessment and Plan: Counselor recommends that patient remains in IOP treatment to better manage mental health symptoms and continue to address treatment plan goals. Counselor recommends adherence to crisis/safety plan, taking medications as prescribed and following up with medical professionals if any issues arise.   Follow Up Instructions: Counselor will send Webex link for next session.  The patient was advised to call back or seek an in-person evaluation if the symptoms worsen or if the condition fails to improve as anticipated.  I provided 180 minutes of non-face-to-face time during this encounter.   Lise Auer, LCSW
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
## 2019-08-05 ENCOUNTER — Other Ambulatory Visit: Payer: Self-pay

## 2019-08-05 ENCOUNTER — Other Ambulatory Visit (HOSPITAL_COMMUNITY): Payer: 59 | Admitting: Psychiatry

## 2019-08-05 ENCOUNTER — Encounter (HOSPITAL_COMMUNITY): Payer: Self-pay

## 2019-08-05 DIAGNOSIS — F3181 Bipolar II disorder: Secondary | ICD-10-CM | POA: Diagnosis not present

## 2019-08-05 NOTE — Progress Notes (Signed)
Virtual Visit via Video Note  I connected with Nicole Rodriguez on 08/05/19 at  9:00 AM EST by a video enabled telemedicine application and verified that I am speaking with the correct person using two identifiers.  Location: Patient: Patient Home Provider: Home Office  Case Manger discussed the limitations of evaluation and management by telemedicine and the availability of in person appointments during orientation. The patient expressed understanding and agreed to proceed.   History of Present Illness: MDD   Observations/Objective: Case Manager checked in with all participants to review discharge dates, insurance authorizations, work-related documents and needs for the treatment team. Counselor processed current mood and functioning and discussed how participants spent their time since last session and if skills were applied. Patient shared that she enjoyed group yesterday, however, afterwards she noted being "on edge", irritable and agitated with others. She noted experiencing mood swings. She identified her spouse as a supportive person to process emotions with. She noted that medications are stabilizing and sleep was well. Nicole Rodriguez presents with moderate depression and high anxiety.   Counselor prompted group members to identify 5 different strengths they possess. Patient shared that she is a good listener, trustworthy, respectful, forgiving and helpful. Counselor provided psychoeducation on self-esteem boosters in positive psychology.   Counselor reviewed information shared on Socratic Questioning and Anxiety Igniting Thought Assessment. Counselor engaged the group in a CBT intervention of [place], prompted the patients to think of a trigger, an automatic thought and then create a new thought. Patient shared their example of being triggered at work in relation to how she feels viewed. Nicole Rodriguez was able to change to a new, more rational and healthy thought. Counselor then  shared a video and reviewed an article of 12 additional skills to apply in Thought Stopping/Challenging. Patients engaged in practicing skills and chose one they would be willing to try for a week to test its effectiveness. Patient chose to practice scattered counting when feeling upset or anxious.  Counselor prompted group members to share their plans for the afternoon, encouraging self-care practice and/or a productivity activity to alleviate stress and anxiety. Patient plans to complete household chores to reduce anxiety and to play video games to relax.   Assessment and Plan: Counselor recommends that patient remains in IOP treatment to better manage mental health symptoms and continue to address treatment plan goals. Counselor recommends adherence to crisis/safety plan, taking medications as prescribed and following up with medical professionals if any issues arise.   Follow Up Instructions: Counselor will send Webex link for next session.  The patient was advised to call back or seek an in-person evaluation if the symptoms worsen or if the condition fails to improve as anticipated.  I provided 180 minutes of non-face-to-face time during this encounter.   Lise Auer, LCSW

## 2019-08-06 ENCOUNTER — Other Ambulatory Visit (HOSPITAL_COMMUNITY): Payer: 59 | Admitting: Psychiatry

## 2019-08-06 ENCOUNTER — Ambulatory Visit (HOSPITAL_COMMUNITY): Payer: 59 | Admitting: Licensed Clinical Social Worker

## 2019-08-06 DIAGNOSIS — F411 Generalized anxiety disorder: Secondary | ICD-10-CM

## 2019-08-06 DIAGNOSIS — F3181 Bipolar II disorder: Secondary | ICD-10-CM

## 2019-08-07 ENCOUNTER — Other Ambulatory Visit: Payer: Self-pay

## 2019-08-07 ENCOUNTER — Encounter (HOSPITAL_COMMUNITY): Payer: Self-pay

## 2019-08-07 ENCOUNTER — Other Ambulatory Visit (HOSPITAL_COMMUNITY): Payer: 59

## 2019-08-07 ENCOUNTER — Telehealth (HOSPITAL_COMMUNITY): Payer: Self-pay | Admitting: Psychiatry

## 2019-08-07 NOTE — Progress Notes (Signed)
Virtual Visit via Video Note  I connected with [name] on 08/07/19 at  9:00 AM EST by a video enabled telemedicine application and verified that I am speaking with the correct person using two identifiers.  Location: Patient: Patient Home Provider: Home Office   Case Manger discussed the limitations of evaluation and management by telemedicine and the availability of in person appointments during orientation. The patient expressed understanding and agreed to proceed.  History of Present Illness: Bipolar 2 DO and Anxiety  Observations/Objective: Case Manager checked in with all participants to review discharge dates, insurance authorizations, work-related documents and needs for the treatment team. Case Manager introduced guest speaker, Rolin Barry, [employer], to facilitate a discussion around Grief and Loss topics. Patient participated in discussion and shared insights about their own needs regarding this topic. [name] shared about an unexpected loss of a close friend and became aware that she has not allowed herself to grieve his death. She would like to spend more time in individual therapy with grief and loss issues. Counselor allowed time for reflection and to journal on the topic of grief/loss and promoted continuing this work in individual therapy and local support groups.   Counselor facilitated a check in with group members to gage mood and current functioning as well as their takeaways from the presentation. [name] shared that she is feeling less on edge than a few days ago. For self-care and to improve self-esteem [name] discussed her decision to get braces and it looking forward to the outcomes and process. [name] presents with moderate depression and moderate anxiety.   Counselor presented information, resources and psychoeducation on Self-Compassion by researcher Dr. Cordella Register. Counselor engaged the group in discussion and therapeutic activities to practice  self-compassion skills in times of distress. Patient noted that she believes self-compassion is choosing not to beat herself up over decisions or interactions with others. She connected with her hand over her heart and doing a bear hug in the self-soothing activity.   Counselor presented the [employer] and prompted group members to highlight a which they could practice and implement into their daily routines for more balanced self-care. Patient identified that she would like to implement writing in her journal more, sharing her reflections with her spouse, and allowing herself to let her grief out.   Counselor ended session by acknowledging a graduating group member by prompting graduating member to reflect on progress made, takeaways from treatment and plan for stepping down. Counselor and group members shared observations of growth, encouragement and support as she transitions out of the program.   Assessment and Plan: Counselor recommends that patient remains in IOP treatment to better manage mental health symptoms and continue to address treatment plan goals. Counselor recommends adherence to crisis/safety plan, taking medications as prescribed and following up with medical professionals if any issues arise.   Follow Up Instructions: Counselor will send Webex link for next session.    I discussed the assessment and treatment plan with the patient. The patient was provided an opportunity to ask questions and all were answered. The patient agreed with the plan and demonstrated an understanding of the instructions.   The patient was advised to call back or seek an in-person evaluation if the symptoms worsen or if the condition fails to improve as anticipated.  I provided 180 minutes of non-face-to-face time during this encounter.   Lise Auer, LCSW

## 2019-08-08 ENCOUNTER — Other Ambulatory Visit (HOSPITAL_COMMUNITY): Payer: 59 | Admitting: Psychiatry

## 2019-08-08 ENCOUNTER — Other Ambulatory Visit: Payer: Self-pay

## 2019-08-08 ENCOUNTER — Encounter (HOSPITAL_COMMUNITY): Payer: Self-pay | Admitting: Psychiatry

## 2019-08-08 DIAGNOSIS — F3181 Bipolar II disorder: Secondary | ICD-10-CM | POA: Diagnosis not present

## 2019-08-08 DIAGNOSIS — F411 Generalized anxiety disorder: Secondary | ICD-10-CM
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
## 2019-08-11 ENCOUNTER — Other Ambulatory Visit: Payer: Self-pay

## 2019-08-11 ENCOUNTER — Other Ambulatory Visit (HOSPITAL_COMMUNITY): Payer: 59

## 2019-08-12 ENCOUNTER — Encounter (HOSPITAL_COMMUNITY): Payer: Self-pay

## 2019-08-12 ENCOUNTER — Other Ambulatory Visit: Payer: Self-pay

## 2019-08-12 ENCOUNTER — Other Ambulatory Visit (HOSPITAL_COMMUNITY): Payer: 59 | Admitting: Psychiatry

## 2019-08-12 DIAGNOSIS — F411 Generalized anxiety disorder: Secondary | ICD-10-CM

## 2019-08-12 DIAGNOSIS — F3181 Bipolar II disorder: Secondary | ICD-10-CM

## 2019-08-12 NOTE — Progress Notes (Signed)
Virtual Visit via Video Note  I connected with Nicole Rodriguez on 08/12/19 at  9:00 AM EST by a video enabled telemedicine application and verified that I am speaking with the correct person using two identifiers.  Location: Patient: Patient Home Provider: Home Office  Case Manger discussed the limitations of evaluation and management by telemedicine and the availability of in person appointments during orientation. The patient expressed understanding and agreed to proceed.   History of Present Illness: MDD and GAD   Observations/Objective: Case Manager checked in with all participants to review discharge dates, insurance authorizations, work-related documents and needs for the treatment team. Counselor processed current mood and functioning and discussed how participants spent their time since last session and if skills were applied. Patient shared that she continues to experience pain from getting braces applied to teeth yesterday. Nicole Rodriguez shared that getting braces is a form of self-care for her, because it is something she has always wanted and anticipates her self-esteem improving with the results of straighter teeth. Nicole Rodriguez shared positive outcomes of her family gathering over the weekend. She was able to regulate emotions and have appropriate interactions with family members. Nicole Rodriguez reports feelings of "being on edge", irritable and agitated in the afternoons. Counselor encouraged communications with providers about impact of medications and discussed application of coping strategies. Nicole Rodriguez presents with moderate anxiety and moderate depression.   Counselor provided psychoeducation on the emotional brain vs the logical brain and emotional intelligence. Counselor provided two assessments for the group to complete which revealed if they operate more left brained or right brained and their level of emotional intelligence. Nicole Rodriguez identified that she is left brained and connected with the  characteristics. She scored in the average range for emotional intelligence and would like to grow in self-regulation. Counselor provided information on this subject using two articles to help identify strategies for patients to use to have balanced reactions to triggers and how to more effectively express and interpret emotions. Nicole Rodriguez noted benefits from the alternate nostril breathing in assisting with calming anxiety responses.   Counselor ended session by acknowledging a graduating group member by prompting graduating member to reflect on progress made, takeaways from treatment and plan for stepping down. Counselor and group members shared observations of growth, encouragement and support as she transitions out of the program.   Assessment and Plan: Counselor recommends that patient remains in IOP treatment to better manage mental health symptoms and continue to address treatment plan goals. Counselor recommends adherence to crisis/safety plan, taking medications as prescribed and following up with medical professionals if any issues arise.   Follow Up Instructions: Counselor will send Webex link for next session.  The patient was advised to call back or seek an in-person evaluation if the symptoms worsen or if the condition fails to improve as anticipated.  I provided 180 minutes of non-face-to-face time during this encounter.   Lise Auer, LCSW

## 2019-08-13 ENCOUNTER — Other Ambulatory Visit: Payer: Self-pay

## 2019-08-13 ENCOUNTER — Other Ambulatory Visit: Payer: Self-pay | Admitting: Emergency Medicine

## 2019-08-13 ENCOUNTER — Other Ambulatory Visit (HOSPITAL_COMMUNITY): Payer: 59 | Admitting: Psychiatry

## 2019-08-13 ENCOUNTER — Encounter (HOSPITAL_COMMUNITY): Payer: Self-pay

## 2019-08-13 DIAGNOSIS — F411 Generalized anxiety disorder: Secondary | ICD-10-CM

## 2019-08-13 DIAGNOSIS — F3181 Bipolar II disorder: Secondary | ICD-10-CM

## 2019-08-13 NOTE — Progress Notes (Signed)
Virtual Visit via Video Note  I connected with Nicole Rodriguez on 08/13/19 at  9:00 AM EST by a video enabled telemedicine application and verified that I am speaking with the correct person using two identifiers.  Location: Patient: Patient Home Provider: Home Office  Case Manger discussed the limitations of evaluation and management by telemedicine and the availability of in person appointments during orientation. The patient expressed understanding and agreed to proceed.   History of Present Illness: Bipolar 2 DO and GAD   Observations/Objective: Case Manager checked in with all participants to review discharge dates, insurance authorizations, work-related documents and needs for the treatment team.   Counselor introduced our guest speaker, Nicole Rodriguez, [facility] Pharmacist, who shared about psychiatric medications, side effects, treatment considerations and how to communicate with medical professionals. Group Members asked questions and shared medication concerns. Counselor prompted group members to reference a worksheet called, "Body Scan" to jot down questions and concerns about their physical health in preparation for their upcoming appointments with medical professionals. Nicole Rodriguez asked questions about her medications and discussed her irritability in the afternoons. She noted that she would like to continue seeking treatment to straighten teeth, work on weight loss and nutrition needs. Counselor encouraged routine medical check-ups, preparing for appointments, following up with recommendations and seeking specialist if needed.   Counselor processed current mood and functioning and discussed how participants spent their time since last session and if skills were applied. Patient shared that she continues to be in pain from her braces and is attempting to eat more solid foods without success. When experiencing anxiety she is utilizing scattered counting, journaling before bed and playing video  games. Nicole Rodriguez presents with moderate depression and moderate anxiety.   Case Manager introduced guest speaker, Nicole Rodriguez, [employer], to facilitate a discussion around Grief and Loss topics. Patient participated in discussion and shared insights about their own needs regarding this topic. Nicole Rodriguez discussed the gains and losses that have occurred for her during the pandemic. Counselor allowed time for reflection and to journal on the topic of grief/loss and promoted continuing this work in individual therapy and local support groups.  Assessment and Plan: Counselor recommends that patient remains in IOP treatment to better manage mental health symptoms and continue to address treatment plan goals. Counselor recommends adherence to crisis/safety plan, taking medications as prescribed and following up with medical professionals if any issues arise.   Follow Up Instructions: Counselor will send Webex link for next session.  The patient was advised to call back or seek an in-person evaluation if the symptoms worsen or if the condition fails to improve as anticipated.  I provided 180 minutes of non-face-to-face time during this encounter.   Nicole Auer, LCSW

## 2019-08-14 ENCOUNTER — Other Ambulatory Visit (HOSPITAL_COMMUNITY): Payer: 59 | Admitting: Psychiatry

## 2019-08-14 ENCOUNTER — Other Ambulatory Visit: Payer: Self-pay

## 2019-08-14 ENCOUNTER — Encounter (HOSPITAL_COMMUNITY): Payer: Self-pay

## 2019-08-14 DIAGNOSIS — F3181 Bipolar II disorder: Secondary | ICD-10-CM

## 2019-08-14 DIAGNOSIS — F411 Generalized anxiety disorder: Secondary | ICD-10-CM

## 2019-08-14 NOTE — Progress Notes (Signed)
Virtual Visit via Video Note  I connected with Nicole Rodriguez on 08/14/19 at  9:00 AM EST by a video enabled telemedicine application and verified that I am speaking with the correct person using two identifiers.  Location: Patient: Patient Home Provider: Home Office  Case Manger discussed the limitations of evaluation and management by telemedicine and the availability of in person appointments during orientation. The patient expressed understanding and agreed to proceed.   History of Present Illness: MDD and GAD   Observations/Objective: Case Manager checked in with all participants to review discharge dates, insurance authorizations, work-related documents and needs for the treatment team. Counselor processed current mood and functioning and discussed how participants spent their time since last session and if skills were applied. Patient discussed the difference in her depression and anxiety. She notes that journaling her thoughts and feelings to recap her day has been very helpful. She also allows her wife to read journal entries to connect better and communicate needs. Nicole Rodriguez presents with moderate anxiety and moderate depression.    Counselor engaged the group in a therapeutic activity, prompting them to reflect on and identify individuals that inspire/motivate them and who are the people who look up to them as mentors or inspiring. Nicole Rodriguez identified family members as her inspiration and as those who look up to her. She expressed a deep connection and affection for people in her life.   Counselor introduced Designer, fashion/clothing from [place] to present on their programming. Alex shared about Peer Support, Support Groups and Wellness Programs to [employer] of mental health symptoms. Group members gathered information and shared about which events most sparked their interest and their plans to engage in services. Nicole Rodriguez is interested in joining the [facility]  Support Group.  Counselor engaged group members in creating mental health crisis/safety plans, which include identifying triggers, coping strategies, support people/contacts, professional contacts, environmental safety and motivators for living. Patient noted that creating the plan was helpful in knowing how to better care for herself and to communicate with others during a crisis. She plans to share her document with her wife and closest supports to be preventative.    Counselor prompted group members to share a self-care task and productivity activity they will do between now and the next group treatment session to alleviate stress.  Nicole Rodriguez plans to do household chores and noted that her mood improved over the course of the group.   Assessment and Plan: Counselor recommends that patient remains in IOP treatment to better manage mental health symptoms and continue to address treatment plan goals. Counselor recommends adherence to crisis/safety plan, taking medications as prescribed and following up with medical professionals if any issues arise.   Follow Up Instructions: Counselor will send Webex link for next session.  The patient was advised to call back or seek an in-person evaluation if the symptoms worsen or if the condition fails to improve as anticipated.  I provided 180 minutes of non-face-to-face time during this encounter.   Lise Auer, LCSW

## 2019-08-15 ENCOUNTER — Other Ambulatory Visit: Payer: Self-pay

## 2019-08-15 ENCOUNTER — Encounter (HOSPITAL_COMMUNITY): Payer: Self-pay

## 2019-08-15 ENCOUNTER — Other Ambulatory Visit (HOSPITAL_COMMUNITY): Payer: 59 | Admitting: Psychiatry

## 2019-08-15 DIAGNOSIS — F411 Generalized anxiety disorder: Secondary | ICD-10-CM

## 2019-08-15 DIAGNOSIS — F3181 Bipolar II disorder: Secondary | ICD-10-CM | POA: Diagnosis not present

## 2019-08-15 NOTE — Progress Notes (Addendum)
Virtual Visit via Video Note  I connected with Nicole Rodriguez on 08/15/19 at  9:00 AM EST by a video enabled telemedicine application and verified that I am speaking with the correct person using two identifiers.  Location: Patient: Patient Home Provider: Home Office  Case Manger discussed the limitations of evaluation and management by telemedicine and the availability of in person appointments during orientation. The patient expressed understanding and agreed to proceed.   History of Present Illness: MDD and GAD   Observations/Objective: Case Manager checked in with all participants to review discharge dates, insurance authorizations, work-related documents and needs for the treatment team. Counselor processed current mood and functioning and discussed how participants spent their time since last session and if skills were applied. Patient shared that she could relate to the trauma responses another group member was experiencing and identified patterns in her mood. She stated that she worked on a project with her spouse yesterday and had reduced anxiety and good nights rest. Nicole Rodriguez presents with moderate anxiety and moderate depression.    Counselor engaged the group in a [employer], where they each had the opportunity to share about any coping skills, tools, resources, agencies or strategies they use or are a part of in managing their mental health. After they each shared, Counselor shared information from a variety of local, state, national and other resources from an exhaustive list to expand their tool knowledge of potential resources to connect with after group treatment ends. Counselor prompted the group members to share which they are most likely to access or utilize with the patient identifying an interest in the Disability Rights of [place] and the crisis text line.   Counselor prompted group members to share a self-care task and productivity activity they will do between now and the  next group treatment session to alleviate stress.  Nicole Rodriguez plans to have a relaxing weekend and to do something nice with her daughter and wife for valentine's day.   Assessment and Plan: Counselor recommends that patient remains in IOP treatment to better manage mental health symptoms and continue to address treatment plan goals. Counselor recommends adherence to crisis/safety plan, taking medications as prescribed and following up with medical professionals if any issues arise.   Follow Up Instructions: Counselor will send Webex link for next session.  The patient was advised to call back or seek an in-person evaluation if the symptoms worsen or if the condition fails to improve as anticipated.  I provided 180 minutes of non-face-to-face time during this encounter.   Lise Auer, LCSW

## 2019-08-18 ENCOUNTER — Ambulatory Visit (INDEPENDENT_AMBULATORY_CARE_PROVIDER_SITE_OTHER): Payer: 59 | Admitting: Psychiatry

## 2019-08-18 ENCOUNTER — Other Ambulatory Visit: Payer: Self-pay

## 2019-08-18 DIAGNOSIS — F3181 Bipolar II disorder: Secondary | ICD-10-CM

## 2019-08-18 DIAGNOSIS — F410 Panic disorder [episodic paroxysmal anxiety] without agoraphobia: Secondary | ICD-10-CM | POA: Diagnosis not present

## 2019-08-18 DIAGNOSIS — F411 Generalized anxiety disorder: Secondary | ICD-10-CM | POA: Diagnosis not present

## 2019-08-18 DIAGNOSIS — F4010 Social phobia, unspecified: Secondary | ICD-10-CM

## 2019-08-18 MED ORDER — LAMOTRIGINE 200 MG PO TABS: 200.0000 mg | ORAL_TABLET | Freq: Every day | ORAL | 2 refills | Status: DC

## 2019-08-18 MED ORDER — LURASIDONE HCL 20 MG PO TABS: 20.0000 mg | ORAL_TABLET | Freq: Every day | ORAL | 0 refills | Status: DC

## 2019-08-18 MED ORDER — TRAZODONE HCL 50 MG PO TABS: 50.0000 mg | ORAL_TABLET | Freq: Every evening | ORAL | 0 refills | Status: DC | PRN

## 2019-08-18 MED ORDER — LORAZEPAM 0.5 MG PO TABS: 0.5000 mg | ORAL_TABLET | Freq: Two times a day (BID) | ORAL | 2 refills | Status: DC | PRN

## 2019-08-18 MED ORDER — PANTOPRAZOLE SODIUM 40 MG PO TBEC: 40.0000 mg | DELAYED_RELEASE_TABLET | Freq: Two times a day (BID) | ORAL | 0 refills | Status: DC
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
## 2019-08-20 ENCOUNTER — Encounter (HOSPITAL_COMMUNITY): Payer: Self-pay

## 2019-08-20 ENCOUNTER — Other Ambulatory Visit (HOSPITAL_COMMUNITY): Payer: 59 | Admitting: Psychiatry

## 2019-08-20 DIAGNOSIS — F411 Generalized anxiety disorder: Secondary | ICD-10-CM

## 2019-08-20 DIAGNOSIS — F3181 Bipolar II disorder: Secondary | ICD-10-CM

## 2019-08-20 NOTE — Progress Notes (Signed)
Virtual Visit via Video Note  I connected with Nicole Rodriguez on 08/20/19 at  9:00 AM EST by a video enabled telemedicine application and verified that I am speaking with the correct person using two identifiers.  Location: Patient: Patient Home Provider: Home Office  Case Manger discussed the limitations of evaluation and management by telemedicine and the availability of in person appointments during orientation. The patient expressed understanding and agreed to proceed.   History of Present Illness: Bipolar 2 and GAD   Observations/Objective: Case Manager checked in with all participants to review discharge dates, insurance authorizations, work-related documents and needs for the treatment team. Counselor processed current mood and functioning and discussed how participants spent their time since last session and if skills were applied. Patient shared she is seeing positive impacts from her medication changes, less irritable, agitated and regulation of mood swings. Nicole Rodriguez experienced power outages over the weekend and into this week, sharing that it caused her to focus on her relationship with her spouse and daughter, feeling reconnected again. Nicole Rodriguez is continuing to find benefits from nightly journaling and sharing entries with her spouse. Nicole Rodriguez presents with mild depression and moderate anxiety.    Counselor prompted group members to identify one new behavior they would like to add to their daily routine and one behavior they would like to eliminate over the past month to worm healthier habits. Nicole Rodriguez would like to be more active in the afternoons to reduce anxiety patterns.   Counselor provided a journal prompt for group members called, "5 Common Emotional Wounds from Childhood", giving them the 5 types to reflect on and additional questions to consider, for discussion in an upcoming session. Nicole Rodriguez briefly shared about her complex relationships with caregivers and that this is  an area she would like to work more on in individual therapy.   Counselor introduced Santiago Bumpers, Mud[employer] of [employer], for a presentation on Self-Care through [employer]. Roselyn Reef provided psychoeducation and activities for self-assessment and self-reflection. Roselyn Reef encouraged creating of customized goals for each group member and application of information. Patient participated and engaged in discussion and activities.   Assessment and Plan: Counselor recommends that patient remains in IOP treatment to better manage mental health symptoms and continue to address treatment plan goals. Counselor recommends adherence to crisis/safety plan, taking medications as prescribed and following up with medical professionals if any issues arise.   Follow Up Instructions: Counselor will send Webex link for next session.  The patient was advised to call back or seek an in-person evaluation if the symptoms worsen or if the condition fails to improve as anticipated.  I provided 180 minutes of non-face-to-face time during this encounter.   Nicole Auer, LCSW

## 2019-08-21 ENCOUNTER — Encounter (HOSPITAL_COMMUNITY): Payer: Self-pay

## 2019-08-21 ENCOUNTER — Other Ambulatory Visit: Payer: Self-pay

## 2019-08-21 ENCOUNTER — Other Ambulatory Visit (HOSPITAL_COMMUNITY): Payer: 59 | Admitting: Psychiatry

## 2019-08-21 DIAGNOSIS — F3181 Bipolar II disorder: Secondary | ICD-10-CM | POA: Diagnosis not present

## 2019-08-21 DIAGNOSIS — F411 Generalized anxiety disorder: Secondary | ICD-10-CM

## 2019-08-21 NOTE — Progress Notes (Signed)
Virtual Visit via Video Note  I connected with Nicole Rodriguez on 08/21/19 at  9:00 AM EST by a video enabled telemedicine application and verified that I am speaking with the correct person using two identifiers.  Location: Patient: Patient Home Provider: Home Office   Case Manger discussed the limitations of evaluation and management by telemedicine and the availability of in person appointments during orientation. The patient expressed understanding and agreed to proceed.  History of Present Illness: Bipolar 2 DO and GAD  Observations/Objective: Case Manager checked in with all participants to review discharge dates, insurance authorizations, work-related documents and needs for the treatment team. Counselor introduced guest speaker, Rolin Barry, [employer] Educator, to facilitate a discussion around Grief and Loss topics. Patient participated in discussion and shared insights about their own needs regarding this topic. Counselor allowed time for reflection and to journal on the topic of grief/loss and promoted continuing this work in individual therapy and local support groups.   Counselor facilitated a check in with group members to gage mood and current functioning as well as their takeaways from the presentation. Nicole Rodriguez happily reported that yesterday she experienced balanced mood, verses mood swings, irritability and agitations. She stated that she would like to start walking daily combat anxiety and would like to do practices learned in treatment with her partner. Nicole Rodriguez expressed intentions to work on Administrator, arts relationship with her father, discussing challenges, fears and plan to address more in depth in individual therapy. Nicole Rodriguez presents with mild depression and mild anxiety.   Counselor introduced Field seismologist, Jan Fireman, Yoga Instructor, to guide the group in a yoga practice to promote mind and body connection, mindfulness and relaxation.  Counselor checked in with all participants to assess the benefits.  Assessment and Plan: Counselor recommends that patient remains in IOP treatment to better manage mental health symptoms and continue to address treatment plan goals. Counselor recommends adherence to crisis/safety plan, taking medications as prescribed and following up with medical professionals if any issues arise.   Follow Up Instructions: Counselor will send Webex link for next session.    I discussed the assessment and treatment plan with the patient. The patient was provided an opportunity to ask questions and all were answered. The patient agreed with the plan and demonstrated an understanding of the instructions.   The patient was advised to call back or seek an in-person evaluation if the symptoms worsen or if the condition fails to improve as anticipated.  I provided 180 minutes of non-face-to-face time during this encounter.   Lise Auer, LCSW

## 2019-08-22 ENCOUNTER — Other Ambulatory Visit (HOSPITAL_COMMUNITY): Payer: 59 | Admitting: Psychiatry

## 2019-08-22 ENCOUNTER — Other Ambulatory Visit: Payer: Self-pay

## 2019-08-22 ENCOUNTER — Encounter (HOSPITAL_COMMUNITY): Payer: Self-pay

## 2019-08-22 DIAGNOSIS — F3181 Bipolar II disorder: Secondary | ICD-10-CM | POA: Diagnosis not present

## 2019-08-22 DIAGNOSIS — F411 Generalized anxiety disorder: Secondary | ICD-10-CM

## 2019-08-22 NOTE — Progress Notes (Signed)
Virtual Visit via Video Note  I connected with Nicole Rodriguez on 08/22/19 at  9:00 AM EST by a video enabled telemedicine application and verified that I am speaking with the correct person using two identifiers.  Location: Patient: Patient Home Provider: Home Office  Case Manger discussed the limitations of evaluation and management by telemedicine and the availability of in person appointments during orientation. The patient expressed understanding and agreed to proceed.   History of Present Illness: MDD and GAD   Observations/Objective: Case Manager checked in with all participants to review discharge dates, insurance authorizations, work-related documents and needs for the treatment team. Counselor processed current mood and functioning and discussed how participants spent their time since last session and if skills were applied. Patient shared noted that she enjoyed the deep breathing aspect of yoga. She processed a stressful situation that increased her anxiety, but was able to note progress in her response, as she better know how to self-sooth, express feelings and thoughts and be more solution focused that emotionally charged. Counselor acknowledged progress in management of anxiety. Nicole Rodriguez presents with moderate anxiety and mild depression.  Counselor engaged the group in an [place] called, Inside [employer]. Counselor prompted group members to create images/visualizations of what they present to others externally, vs what they are experiencing on the inside. Counselor shared examples to spark ideas and allowed time for their creations. Nicole Rodriguez created a list of opposites form what she shows others to the feelings she suppresses. She would like to work on showing her best side to her family and learning healthy outlets in the workplace.  Counselor prompted group members to share a self-care task and productivity activity they will do between now and the next group  treatment session to alleviate stress.  Nicole Rodriguez plans to work on finding alternative housing and focusing on positive thoughts.   Assessment and Plan: Counselor recommends that patient remains in IOP treatment to better manage mental health symptoms and continue to address treatment plan goals. Counselor recommends adherence to crisis/safety plan, taking medications as prescribed and following up with medical professionals if any issues arise.   Follow Up Instructions: Counselor will send Webex link for next session.  The patient was advised to call back or seek an in-person evaluation if the symptoms worsen or if the condition fails to improve as anticipated.  I provided 180 minutes of non-face-to-face time during this encounter.   Lise Auer, LCSW

## 2019-08-25 ENCOUNTER — Other Ambulatory Visit: Payer: Self-pay

## 2019-08-25 ENCOUNTER — Encounter (HOSPITAL_COMMUNITY): Payer: Self-pay | Admitting: Psychiatry

## 2019-08-25 ENCOUNTER — Other Ambulatory Visit (HOSPITAL_COMMUNITY): Payer: 59 | Admitting: Psychiatry

## 2019-08-25 DIAGNOSIS — F3181 Bipolar II disorder: Secondary | ICD-10-CM

## 2019-08-25 DIAGNOSIS — F411 Generalized anxiety disorder: Secondary | ICD-10-CM

## 2019-08-25 NOTE — Progress Notes (Deleted)
Virtual Visit via Telephone Note  I connected with [name] on 08/25/19 at  9:00 AM EST by telephone and verified that I am speaking with the correct person using two identifiers.   I discussed the limitations, risks, security and privacy concerns of performing an evaluation and management service by telephone and the availability of in person appointments. I also discussed with the patient that there may be a patient responsible charge related to this service. The patient expressed understanding and agreed to proceed.    I discussed the assessment and treatment plan with the patient. The patient was provided an opportunity to ask questions and all were answered. The patient agreed with the plan and demonstrated an understanding of the instructions.   The patient was advised to call back or seek an in-person evaluation if the symptoms worsen or if the condition fails to improve as anticipated.  I provided 30 minutes of non-face-to-face time during this encounter.   Derrill Center, NP   [facility] Discharge Summary  Nicole Rodriguez [id number]  Admission date: 08/04/2019 Discharge date: 08/30/2019  Reason for admission: Per admission assessment note: Nicole Rodriguez is a 29 year old Caucasian female presents with worsening depression and anxiety for the past 5 to 6 years.States she has a 46 year old daughter and is  married to her wife for 5 years who is supportive. Reports she is currently employed by [employer], has a Scientist, product/process development. Stated she used to enjoy working however due to her depression symptoms, her job has become overwhelming and stressful   Family of Origin Issues:   Progress in Program Toward Treatment Goals:  Ongoing. Patient has attended and participated with daily group session with active and engaged participation.   Progress (rationale): Keep follow-up with Pucilowski MD  .   Take all medications as  prescribed. Keep all follow-up appointments as scheduled.  Do not consume alcohol or use illegal drugs while on prescription medications. Report any adverse effects from your medications to your primary care provider promptly.  In the event of recurrent symptoms or worsening symptoms, call 911, a crisis hotline, or go to the nearest emergency department for evaluation.   Derrill Center, NP 08/25/2019

## 2019-08-25 NOTE — Progress Notes (Signed)
Virtual Visit via Video Note  I connected with [name] on 08/25/19 at  9:00 AM EST by a video enabled telemedicine application and verified that I am speaking with the correct person using two identifiers.  Location: Patient: Patient Home Provider: Home Office  Case Manger discussed the limitations of evaluation and management by telemedicine and the availability of in person appointments during orientation. The patient expressed understanding and agreed to proceed.   History of Present Illness: Bipolar 2 and GAD   Observations/Objective: Case Manager checked in with all participants to review discharge dates, insurance authorizations, work-related documents and needs for the treatment team. Counselor processed current mood and functioning and discussed how participants spent their time since last session and if skills were applied. Patient shared that she was severely depressed and anxious over the weekend due to housing issue and unhealthy communications from family members. [name] discussed how she communicated boundaries with the family member and reached out to her spouse for emotional support and understanding. [name] reports feeling "on edge" at wake, throughout the day and into the night. She stated that her spouse was concerned enough to stay home with her today. Counselor and Case Manager worked to reassess therapeutic needs in order to request additional time in treatment program. [name] presents with severe anxiety and moderate depression.   During the session, group members all identified a spike in depressive symptoms due to events over the weekend. Counselor allowed space for group processing, feedback sharing and skill identification between group members and counselor. Themes discussed: boundary setting, trauma triggers/responses, communicating needs, grief and loss, ongoing mental health needs/treatment options. Patient participated in group processing.  Counselor  provided psychoeducation on childhood trauma's, using the Adverse Childhood Experience Study. Counselor facilitated the ACE's study, prompting group members to share their results. Counselor explored childhood trauma's and highlighted the findings of the study connected with risk of physical, social and emotional challenges in adulthood. Patient shared that her score was a 5/10 and that she could like depression for her self and within her family context related to a history of childhood traumas.   Counselor promoted self-compassion and self-soothing exercises for group members to decrease depressive symptoms and due to the heaviness of the topic, which could be triggering. Counselor to continue with topic and coping skills for tomorrow.  Counselor prompted group members to share a self-care task and productivity activity they will do between now and the next group treatment session to alleviate stress. [name] reported that she needed to attend a dental appointment and that she would attempt to rest and relax afterwards to reduce anxiety.   Assessment and Plan: Counselor recommends that patient remains in IOP treatment to better manage mental health symptoms and continue to address treatment plan goals. Counselor recommends adherence to crisis/safety plan, taking medications as prescribed and following up with medical professionals if any issues arise.   Follow Up Instructions: Counselor will send Webex link for next session.  The patient was advised to call back or seek an in-person evaluation if the symptoms worsen or if the condition fails to improve as anticipated.  I provided 180 minutes of non-face-to-face time during this encounter.   Lise Auer, LCSW

## 2019-08-26 ENCOUNTER — Ambulatory Visit (HOSPITAL_COMMUNITY): Payer: 59 | Admitting: Licensed Clinical Social Worker

## 2019-08-26 ENCOUNTER — Other Ambulatory Visit (HOSPITAL_COMMUNITY): Payer: 59 | Admitting: Psychiatry

## 2019-08-26 ENCOUNTER — Encounter (HOSPITAL_COMMUNITY): Payer: Self-pay

## 2019-08-26 ENCOUNTER — Other Ambulatory Visit: Payer: Self-pay

## 2019-08-26 DIAGNOSIS — F3181 Bipolar II disorder: Secondary | ICD-10-CM | POA: Diagnosis not present

## 2019-08-26 DIAGNOSIS — F411 Generalized anxiety disorder: Secondary | ICD-10-CM

## 2019-08-26 NOTE — Progress Notes (Signed)
Virtual Visit via Video Note  I connected with [name] on 08/26/19 at  9:00 AM EST by a video enabled telemedicine application and verified that I am speaking with the correct person using two identifiers.  Location: Patient: Patient Home Provider: Home Office  Case Manger discussed the limitations of evaluation and management by telemedicine and the availability of in person appointments during orientation. The patient expressed understanding and agreed to proceed.   History of Present Illness: Bipolar 2 and GAD   Observations/Objective: Case Manager checked in with all participants to review discharge dates, insurance authorizations, work-related documents and needs for the treatment team. Counselor processed current mood and functioning and discussed how participants spent their time since last session and if skills were applied. Patient shared she was able to get an extension from insurance to continue in treatment, due to current depressive episode. [name] noted that her irritability and agitation stayed at same level as reported yesterday, and was thankful it had not increased. [name] requested support of spouse to monitor her mood swings and to be present in case symptoms worsened, with spouse making arrangements to do so. [name] presents with moderate depression and high anxiety.   Counselor provided reviewed content from yesterday's session, looping back on impact of Adverse Childhood Experiences and how they were able to attend to their emotional reactions yesterday. Counselor provided psychoeducation to group members about Hulan Fess Stages of Development and discussed how their development was impacted in relation to timing of traumas in their life span. Counselor shared a presentation on Adult Children of Alcoholic's, broadening the information to include dysfunctional parenting due to addiction, neglect, abuse, abandonment and untreated mental illness. Group members  engaged in discussion sharing about their personal family life experiences in relation to the information. Patient shared that her core traumas occurred between the ages for 3-5 and 12-15, noting a connection in her delay in development in those areas. She identifies a parentified and the "Lost Child" in her family.    Counselor promoted self-compassion and self-soothing exercises for group members to decrease depressive symptoms and due to the heaviness of the topic, which could be triggering. Counselor to continue with topic and coping skills for tomorrow.  Counselor prompted group members to share a self-care task and productivity activity they will do between now and the next group treatment session to alleviate stress.  [name] plans to take a nap, as she experienced restless and anxious sleep overnight and reports feeling emotionally overwhelmed.   Assessment and Plan: Counselor recommends that patient remains in IOP treatment to better manage mental health symptoms and continue to address treatment plan goals. Counselor recommends adherence to crisis/safety plan, taking medications as prescribed and following up with medical professionals if any issues arise.   Follow Up Instructions: Counselor will send Webex link for next session.  The patient was advised to call back or seek an in-person evaluation if the symptoms worsen or if the condition fails to improve as anticipated.  I provided 180 minutes of non-face-to-face time during this encounter.   Lise Auer, LCSW

## 2019-08-27 ENCOUNTER — Other Ambulatory Visit: Payer: Self-pay

## 2019-08-27 ENCOUNTER — Other Ambulatory Visit (HOSPITAL_COMMUNITY): Payer: 59 | Admitting: Psychiatry

## 2019-08-27 ENCOUNTER — Encounter (HOSPITAL_COMMUNITY): Payer: Self-pay

## 2019-08-27 DIAGNOSIS — F411 Generalized anxiety disorder: Secondary | ICD-10-CM

## 2019-08-27 DIAGNOSIS — F3181 Bipolar II disorder: Secondary | ICD-10-CM | POA: Diagnosis not present

## 2019-08-27 NOTE — Progress Notes (Signed)
Virtual Visit via Video Note  I connected with Nicole Rodriguez on 08/27/19 at  9:00 AM EST by a video enabled telemedicine application and verified that I am speaking with the correct person using two identifiers.  Location: Patient: Patient Home Provider: Home Office   Case Manger discussed the limitations of evaluation and management by telemedicine and the availability of in person appointments during orientation. The patient expressed understanding and agreed to proceed.  History of Present Illness: MDD and GAD   Observations/Objective: Case Manager checked in with all participants to review discharge dates, insurance authorizations, work-related documents and needs for the treatment team.   Counselor introduced our guest speaker, Einar Grad, [facility] Pharmacist, who shared about psychiatric medications, side effects, treatment considerations and how to communicate with medical professionals. Group Members asked questions and shared medication concerns. Counselor prompted group members to reference a worksheet called, "Body Scan" to jot down questions and concerns about their physical health in preparation for their upcoming appointments with medical professionals. Nicole Rodriguez shared that she is closely monitoring her depression and anxiety with professionals and is in good health otherwise. Counselor encouraged routine medical check-ups, preparing for appointments, following up with recommendations and seeking specialist if needed.   Counselor introduced the topic of Resiliency to the group, prompting them to share their understanding of the concept. Counselor then facilitated a Resiliency Questionnaire for the patients to assessed their score based on experiences and interactions with caregivers/trusted adults in childhood. Counselor shared psychoeducation on the components of Resiliency and prompted group members to share about their resiliency score and what they attribute to their resiliency.  Patient shared that she scored a 12/14 for her resiliency assessment. She noted close connections with her grandparents, and with her father, up to a certain age. She found safety and love in those relationships.   Counselor shifted to discussing the concept of self-compassion, sharing information from Dr. Cyril Mourning Neff's research on the components and assessing the patients understanding of how to implement practices within their own lives. Patient shared that she would like to not be so hard on herself, more loving and accepting of herself and her feelings. She wants to get better at attending to her own needs before things get out of control.   Counselor prompted group members to share a self-care task and productivity activity they will do between now and the next group treatment session to alleviate stress. Today, since the weather is nice, she plans to go for a walk outside with her dog and her daughter. She also made a hair appointment for self-care.   Assessment and Plan: Counselor recommends that patient remains in IOP treatment to better manage mental health symptoms and continue to address treatment plan goals. Counselor recommends adherence to crisis/safety plan, taking medications as prescribed and following up with medical professionals if any issues arise.   Follow Up Instructions: Counselor will send Webex link for next session.    I discussed the assessment and treatment plan with the patient. The patient was provided an opportunity to ask questions and all were answered. The patient agreed with the plan and demonstrated an understanding of the instructions.   The patient was advised to call back or seek an in-person evaluation if the symptoms worsen or if the condition fails to improve as anticipated.  I provided 180 minutes of non-face-to-face time during this encounter.   Nicole Auer, LCSW

## 2019-08-28 ENCOUNTER — Ambulatory Visit (INDEPENDENT_AMBULATORY_CARE_PROVIDER_SITE_OTHER): Payer: 59 | Admitting: Psychology

## 2019-08-28 ENCOUNTER — Other Ambulatory Visit: Payer: Self-pay

## 2019-08-28 ENCOUNTER — Encounter (HOSPITAL_COMMUNITY): Payer: Self-pay | Admitting: Psychology

## 2019-08-28 DIAGNOSIS — F411 Generalized anxiety disorder: Secondary | ICD-10-CM

## 2019-08-28 DIAGNOSIS — F3181 Bipolar II disorder: Secondary | ICD-10-CM | POA: Diagnosis not present

## 2019-08-28 NOTE — Progress Notes (Signed)
Virtual Visit via Video Note  I connected with Nicole Rodriguez on 08/28/19 at  9:00 AM EST by a video enabled telemedicine application and verified that I am speaking with the correct person using two identifiers.   I discussed the limitations of evaluation and management by telemedicine and the availability of in person appointments. The patient expressed understanding and agreed to proceed.    I discussed the assessment and treatment plan with the patient. The patient was provided an opportunity to ask questions and all were answered. The patient agreed with the plan and demonstrated an understanding of the instructions.   The patient was advised to call back or seek an in-person evaluation if the symptoms worsen or if the condition fails to improve as anticipated.  I provided 60 minutes of non-face-to-face time during this encounter.   Jan Fireman [facility]    THERAPIST PROGRESS NOTE  Session Time: 9am-10am  Participation Level: Active  Behavioral Response: Well GroomedAlertaffect wnl  Type of Therapy: Individual Therapy  Treatment Goals addressed: Diagnosis: Bipolar 1 d/o, anxiety and goal 1.  Interventions: CBT, Strength-based and Supportive  Summary: Nicole Rodriguez is a 29 y.o. female who presents with affect wnl.  pt was able to share her hx of emotional and mental health and the transitions and relationships in her life that have impacted her.  Pt shared depressive episodes that resulted in being taken out from work on [employer] and brought her to tx w/ Dr. Montel Culver and w/ IOP.  Pt reported that she has been gaining skills and learning to express emotions through IOP and was approved for more days.  Pt discussed her want to continue w/ individual counseling to further address anxiety, depression and hypomanic symptoms experiences and struggles w/ trust and getting close to people and coping through stressors.  Recent stressor was just being informed by landlord that she is selling  house they are renting and have to be out by end of March.  Pt discussed her supports of wife, her dog, enjoying her family time, enjoying video games and benefiting from journaling.   Suicidal/Homicidal: Nowithout intent/plan  Therapist Response: Assessed pt current functioning per pt report.  Focused on building rapport and allowing pt to discuss her tx hx and things she is gaining from IOP and next steps she is looking for from counseling. Reflected positives, supports and reiterated healthy coping skills and boundaries.  Plan: Continue and complete IOP. Return again in 2 weeks for individual counseling.  F/u as scheduled w/ Dr. Montel Culver.  Diagnosis: Bipolar 1 d/o and GAD   Jan Fireman [facility] 08/28/2019

## 2019-08-29 ENCOUNTER — Other Ambulatory Visit (HOSPITAL_COMMUNITY): Payer: 59 | Admitting: Licensed Clinical Social Worker

## 2019-08-29 ENCOUNTER — Other Ambulatory Visit: Payer: Self-pay

## 2019-08-29 DIAGNOSIS — F3181 Bipolar II disorder: Secondary | ICD-10-CM | POA: Diagnosis not present

## 2019-08-29 DIAGNOSIS — F411 Generalized anxiety disorder: Secondary | ICD-10-CM

## 2019-08-29 NOTE — Progress Notes (Signed)
Virtual Visit via Video Note  I connected withBrittany Rodriguez on 08/29/19 at  9:00 AM EST by a video enabled telemedicine application and verified that I am speaking with the correct person using two identifiers.  Location: Patient: Patient Home Provider: [place] Office  Case Manager discussed the limitations of evaluation and management by telemedicine and the availability of in person appointments during orientation. The patient expressed understanding and agreed to proceed.  History of Present Illness: BPD II and GAD  Observations/Objective: Case Manager checked in with all participants to review discharge dates, insurance authorizations, work-related documents and needs for the treatment team.   Counselor co-facilitated group with Lise Auer, LCSW today.  Counselor facilitated a check-in with group members to gauge mood and current functioning as well as identify recent progress towards treatment goals. Nicole Rodriguez reported that she has made progress in exercising more regularly, and walked x2 in past week for 25-30 minutes each time.  She reported that she has been unable to locate suitable housing yet, but she is continuing to work on maintaining healthier boundaries with toxic family members.  She also reported that her sleep is improving, and she is trying to manage her depression better.  Nicole Rodriguez reported that her anxiety remains high, but she did recently get a hair cut for self care.      Counselor engaged the group in discussion on self-care today, including various coping skills and hobbies members have utilized for mood management in past and present.  Nicole Rodriguez reported that she has begun journaling nightly before bed to get troubling thoughts out, in addition to walking the dog, and spending time with daughter and wife.  Nicole Rodriguez reported desire to learn new anxiety coping skills while in treatment.    Counselor concluded session with demonstration of relaxation technique  known as mindful breathing to help members increase sense of calm, resiliency, and control.  Clinician guided members through process of getting comfortable, achieving a relaxed breathing rhythm, and focusing on this for several minutes, allowing troubling thoughts and feelings to come and go without rumination.  Nicole Rodriguez successfully participated in exercise and reported that although she believes it could be helpful, she was distracted by her daughter playing in the other room.  Counselor encouraged her to consider use of a sound machine or relaxing sounds on Youtube to assist with improving effectiveness of practice.    Assessment and Plan: Counselor recommends that patient remains in IOP treatment to better manage mental health symptoms and continue to address treatment plan goals. Counselor recommends adherence to crisis/safety plan, taking medications as prescribed and following up with medical professionals if any issues arise.   Follow Up Instructions: Counselor will send Webex link for next session.   I discussed the assessment and treatment plan with the patient. The patient was provided an opportunity to ask questions and all were answered. The patient agreed with the plan and demonstrated an understanding of the instructions.  The patient was advised to call back or seek an in-person evaluation if the symptoms worsen or if the condition fails to improve as anticipated.  I provided 180 minutes of non-face-to-face time during this encounter.  Shade Flood, LCSW, LCAS

## 2019-09-01 ENCOUNTER — Other Ambulatory Visit (HOSPITAL_COMMUNITY): Payer: 59 | Attending: Family | Admitting: Psychiatry

## 2019-09-01 ENCOUNTER — Other Ambulatory Visit: Payer: Self-pay

## 2019-09-01 ENCOUNTER — Ambulatory Visit: Payer: Self-pay | Admitting: Emergency Medicine

## 2019-09-01 DIAGNOSIS — F419 Anxiety disorder, unspecified: Secondary | ICD-10-CM | POA: Diagnosis not present

## 2019-09-01 DIAGNOSIS — F3181 Bipolar II disorder: Secondary | ICD-10-CM

## 2019-09-01 DIAGNOSIS — Z79899 Other long term (current) drug therapy: Secondary | ICD-10-CM | POA: Insufficient documentation

## 2019-09-01 DIAGNOSIS — F411 Generalized anxiety disorder: Secondary | ICD-10-CM

## 2019-09-01 NOTE — Progress Notes (Signed)
Virtual Visit via Video Note   I connected with Nicole Rodriguez on 09/01/19 at  9:00 AM EST by a video enabled telemedicine application and verified that I am speaking with the correct person using two identifiers.   Location: Patient: Patient Home Provider: [place] Office   Case Manager discussed the limitations of evaluation and management by telemedicine and the availability of in person appointments during orientation. The patient expressed understanding and agreed to proceed.   History of Present Illness: BPD II and GAD   Observations/Objective: Case Manager checked in with all participants to review discharge dates, insurance authorizations, work-related documents and needs for the treatment team.    Counselor co-facilitated group with Lise Auer, LCSW today.  [employer] students were also allowed to observe with approval from group members.  Counselor facilitated a check-in with group members to gauge current mood, functioning, and also identify recent progress made towards treatment goals.  Nicole Rodriguez reported that she was feeling well today and had a pleasant weekend, spending time with her partner and daughter going to the science center and zoo.  Nicole Rodriguez reported that she also received good news about housing, since her father in law will assist them financially while allowing them to live with them in the meantime.  Nicole Rodriguez reported that this was akin to a large weight being lifted from her shoulders, and her luck 'turned a corner'.  Nicole Rodriguez also reported that she will begin school today for pre-calculus, and is equally excited and nervous.     Counselor engaged the group in discussion on managing work/life balance today to improve mental health and wellness.  Counselor explained how finding balance between responsibilities at home and work place can be challenging, lead to increased stress, and this has been further complicated by recent pandemic leading to unemployment, more  virtual work, and blurring of lines between home as a place of rest or work duties.  Counselor facilitated discussion on what challenges members have faced with this issue historically, as well as what, if any, issues have arisen following pandemic.  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  Nicole Rodriguez reported that she decided to take a leap of faith and applied for a position at another company which she lacked experience for, but this turned out to be a wise move, as they have been very supportive, and led her down a new career path.     Counselor also discussed strategies for improving work/life balance while members work on their mental health during treatment.  Some of these included keeping track of time management; creating a list of priorities and scaling importance; setting realistic, measurable goals each day; establishing boundaries; taking care of health needs; and nurturing relationships at home and work for support.  Counselor inquired about areas where members feel they are excelling, as well as areas they could focus on during treatment.  Nicole Rodriguez reported that she has a good work/life balance at this time, and loves the other employees she works with, so adapting to the pandemic in this regard has not been too difficult.  She reported that transitioning to a new job has also provided her with resources to pay for college and advance her career, so she will need to consider finding balance between school, work, and home life now.  Nicole Rodriguez was encouraged to continue setting realistic goals,  prioritizing most important tasks each day, and relying upon her support system as she begins this new adjustment to schedule.     Assessment and Plan: Counselor recommends that patient remains in IOP treatment to better manage mental health  symptoms and continue to address treatment plan goals. Counselor recommends adherence to crisis/safety plan, taking medications as prescribed and following up with medical professionals if any issues arise.    Follow Up Instructions: Counselor will send Webex link for next session.    I discussed the assessment and treatment plan with the patient. The patient was provided an opportunity to ask questions and all were answered. The patient agreed with the plan and demonstrated an understanding of the instructions.   The patient was advised to call back or seek an in-person evaluation if the symptoms worsen or if the condition fails to improve as anticipated.   I provided 180 minutes of non-face-to-face time during this encounter.  Shade Flood, LCSW, LCAS

## 2019-09-02 ENCOUNTER — Encounter (HOSPITAL_COMMUNITY): Payer: Self-pay | Admitting: Family

## 2019-09-02 ENCOUNTER — Other Ambulatory Visit (HOSPITAL_COMMUNITY): Payer: 59 | Admitting: Family

## 2019-09-02 ENCOUNTER — Other Ambulatory Visit: Payer: Self-pay

## 2019-09-02 DIAGNOSIS — F3181 Bipolar II disorder: Secondary | ICD-10-CM | POA: Diagnosis not present

## 2019-09-02 DIAGNOSIS — F411 Generalized anxiety disorder: Secondary | ICD-10-CM

## 2019-09-02 NOTE — Patient Instructions (Signed)
D:  Patient completed MH-IOP today.  A:  Discharge today.  Follow up with Dr. Montel Culver on 09-16-19 @ 2:30 pm and Jan Fireman 09-23-19 @ 12:30 pm.  Encouraged support groups.  R:  Patient receptive.

## 2019-09-02 NOTE — Progress Notes (Signed)
Virtual Visit via Telephone Note  I connected with Nicole Rodriguez on 09/02/19 at  9:00 AM EST by telephone and verified that I am speaking with the correct person using two identifiers.   I discussed the limitations, risks, security and privacy concerns of performing an evaluation and management service by telephone and the availability of in person appointments. I also discussed with the patient that there may be a patient responsible charge related to this service. The patient expressed understanding and agreed to proceed.  I discussed the assessment and treatment plan with the patient. The patient was provided an opportunity to ask questions and all were answered. The patient agreed with the plan and demonstrated an understanding of the instructions.   The patient was advised to call back or seek an in-person evaluation if the symptoms worsen or if the condition fails to improve as anticipated.  I provided 15 minutes of non-face-to-face time during this encounter.   Derrill Center, NP   [facility] Discharge Summary  Nicole Rodriguez [id number]  Admission date: 08/04/2019 Discharge date: 09/02/2019  Reason for admission: Per admission assessment note: Luddie Gerhold is a 29 year old Caucasian female presents with worsening depression and anxiety for the past 5 to 6 years.States she has a 69 year old daughter and is  married to her wife for 5 years who is supportive. Reports she is currently employed by [employer], has a Scientist, product/process development. Stated she used to enjoy working however due to her depression symptoms, her job has become overwhelming and stressful.[name] she is followed by Dr. Bary Leriche where she was recently diagnosis of bipolar 2 disorder due to her worsening depression and anxiety symptoms. Cites mood swings, poor concentration, panic attacks and anhedonia..  Reported she is prescribed Wellbutrin 300mg , BuSpar 15 mg BID, Lamictal  50mg   recently started Latuda 40 mg  Trazodone 50mg  PRN , Ativan 0.5mg    Chemical Use History: Denied   Family of Origin Issues: [name] reported her wife has been supportive during her admission.  Reported she is looking forward to relocating.  Progress in Program Toward Treatment Goals:  Ongoing, patient attended and participated with daily group session with active and engaged participation.  Reports overall her mood has improved.  Reports dealing with recent stressors related to housing however states she is recently found a living situation for she and her family.  During this assessment she denies suicidal or homicidal ideations.  Denies auditory or visual hallucinations.  Reports taking and tolerating medications well.  Patient reports learning distraction techniques for anxiety.  Support,encouragement and reassurance was provided.  Progress (rationale):  Patient to follow-up with [facility]  Take all medications as prescribed. Keep all follow-up appointments as scheduled.  Do not consume alcohol or use illegal drugs while on prescription medications. Report any adverse effects from your medications to your primary care provider promptly.  In the event of recurrent symptoms or worsening symptoms, call 911, a crisis hotline, or go to the nearest emergency department for evaluation.   Derrill Center, NP 09/02/2019

## 2019-09-02 NOTE — Progress Notes (Signed)
Patient ID: Nicole Rodriguez, female   DOB: 17-Jul-1990, 29 y.o.   MRN: [id number] Virtual Visit via Video Note  I connected with Nicole Rodriguez on @TODAY @ at 0830 by a video enabled telemedicine application and verified that I am speaking with the correct person using two identifiers.   I discussed the limitations of evaluation and management by telemedicine and the availability of in person appointments. The patient expressed understanding and agreed to proceed.  I discussed the assessment and treatment plan with the patient. The patient was provided an opportunity to ask questions and all were answered. The patient agreed with the plan and demonstrated an understanding of the instructions.   The patient was advised to call back or seek an in-person evaluation if the symptoms worsen or if the condition fails to improve as anticipated.  I provided 20 minutes of non-face-to-face time during this encounter.   Per previous CCA on 07-24-19:   Pt reports to [facility] as referral from [facility]. Pt reports she has recently struggled with increased depression and anxiety. Pt was written out of work as of 06/03/19 due to symptoms. Pt started treatment with medications for anxiety in 2016 by OBGYN. Pt started meds for Depression in Sept 2020. Pt has not had success with medications. Pt saw Dr. Montel Culver on 1/16 and was prescribed new medications. Pt denies any counseling treatment; pt is scheduled with Jaclynn Major on 2/3 for first apt. Pt reports new meds are already helping. Pt denies any previous attempts or hospitalizations; pt endorses passive SI in recent past; pt denies since starting new medications. Pt denies HI/AVH. Pt denies self-harm hx. Pt states she picks at her fingers when anxious which causes fingers to hurt. Pt is unable to identify specific stressors that may have contributed to increase in depression/anxiety. Pt reports she was in college and had to take a semester off this year due to symptoms. Pt has  good support in wife of 5 years, in-laws, mother, and 65yo daughter.   Pt attended all scheduled MH-IOP days and a few extra days d/t SI.  As of today, pt states she is overall stabilizing.  Reports decreased anxiety.  "I have better coping skills and I've started journaling.  Denies SI/HI and A/V hallucinations.  On a scale from 1-10 (10 being the worst):  Pt rated depression as a 3 and anxiety as a 5.  According to pt, she feels better since the housing situation has gotten taken care of. A:  D/C today.  F/U with Dr. Montel Culver on 09-16-19 @ 2pm and Jan Fireman, [facility] on 09-23-19 @ 12:30 pm.  Encouraged support groups.  R:  Pt receptive.  Dellia Nims, M.Ed,CNA

## 2019-09-02 NOTE — Progress Notes (Signed)
Virtual Visit via Video Note  I connected with Nicole Rodriguez on 09/02/19 at  9:00 AM EST by a video enabled telemedicine application and verified that I am speaking with the correct person using two identifiers.  Location: Patient: Patient Home Provider: Home Office  Case Manger discussed the limitations of evaluation and management by telemedicine and the availability of in person appointments during orientation. The patient expressed understanding and agreed to proceed.   History of Present Illness: _   Observations/Objective: Case Manager checked in with all participants to review discharge dates, insurance authorizations, work-related documents and needs for the treatment team. Counselor processed current mood and functioning and discussed how participants spent their time since last session and if skills were applied. Patient shared that she was able to spend quality time with her 30 yo which brings her joy. She acknowledged that her anxiety has reduced significantly, as her housing situation is looking more optimistic. Nicole Rodriguez shared that she engaged in self-care by going out to eat at her favorite restaurant. Nicole Rodriguez stated feeling better overall and prepared to graduate from the program today. Nicole Rodriguez presents with mild depression and mild anxiety.   Counselor provided psychoeducation on cognitive distortions, exploring their baseline understanding, providing a educational video of 15 cognitive distortions, presenting 10 strategies for changing cognitive distortions to rational thinking. Counselor facilitated a therapeutic CBT activity to rewrite negative automatic thoughts using techniques learned from the 10 strategies. Group members shared their work with each other highlighting new thoughts and challenges they faced with the activity. Patient shared that she is aware that she naturally "njumps to conclusions", she noted that this happens when she neglects to go to the source to talk  things out. She created a new thought by depersonalizing the trigger.    Counselor ended session by acknowledging Nicole Rodriguez as a graduating group member by prompting her to reflect on progress made, takeaways from treatment and plan for stepping down. Nicole Rodriguez identified that the program has been "extremely helpful" in lowering her anxiety. She noted that the daily practice of journaling and scattered counting has been the more instrumental tools to her growth. She encouraged other group members to "trust the process" and implement skills discussed. She noted feeling more confident and optimistic to take on future life stressors.  Counselor and group members shared observations of growth, encouragement and support as she transitions out of the program.   Counselor prompted group members to share a self-care task and productivity activity they will do between now and the next group treatment session to alleviate stress. Nicole Rodriguez plans to attend a doctors appointment and to walk with her pets and family this afternoon.   Assessment and Plan: Counselor recommends that patient step down from IOP treatment to individual therapy and medication management to continue management of mental health symptoms and continue to address treatment plan goals. Counselor recommends adherence to crisis/safety plan, taking medications as prescribed and following up with medical professionals if any issues arise.   Follow Up Instructions: Counselor will send Webex link for next session.  The patient was advised to call back or seek an in-person evaluation if the symptoms worsen or if the condition fails to improve as anticipated.  I provided 180 minutes of non-face-to-face time during this encounter.   Lise Auer, LCSW

## 2019-09-03 ENCOUNTER — Other Ambulatory Visit: Payer: Self-pay

## 2019-09-03 ENCOUNTER — Other Ambulatory Visit (HOSPITAL_COMMUNITY): Payer: 59

## 2019-09-05 ENCOUNTER — Ambulatory Visit (HOSPITAL_COMMUNITY): Payer: 59

## 2019-09-08 ENCOUNTER — Ambulatory Visit (HOSPITAL_COMMUNITY): Payer: 59

## 2019-09-09 ENCOUNTER — Ambulatory Visit (INDEPENDENT_AMBULATORY_CARE_PROVIDER_SITE_OTHER): Payer: 59 | Admitting: Psychology

## 2019-09-09 ENCOUNTER — Other Ambulatory Visit: Payer: Self-pay

## 2019-09-09 DIAGNOSIS — F411 Generalized anxiety disorder: Secondary | ICD-10-CM

## 2019-09-09 DIAGNOSIS — F3181 Bipolar II disorder: Secondary | ICD-10-CM | POA: Diagnosis not present

## 2019-09-09 NOTE — Progress Notes (Signed)
Virtual Visit via Video Note  I connected with [name] on 09/09/19 at  8:00 AM EST by a video enabled telemedicine application and verified that I am speaking with the correct person using two identifiers.   I discussed the limitations of evaluation and management by telemedicine and the availability of in person appointments. The patient expressed understanding and agreed to proceed.   I discussed the assessment and treatment plan with the patient. The patient was provided an opportunity to ask questions and all were answered. The patient agreed with the plan and demonstrated an understanding of the instructions.   The patient was advised to call back or seek an in-person evaluation if the symptoms worsen or if the condition fails to improve as anticipated.  I provided 48 minutes of non-face-to-face time during this encounter.   Jan Fireman [facility]    THERAPIST PROGRESS NOTE  Session Time: 8am-8.48am  Participation Level: Active  Behavioral Response: Well GroomedAlertaffect wnl.    Type of Therapy: Individual Therapy  Treatment Goals addressed: Diagnosis: bipolar d/o and GAD and goal 1.  Interventions: CBT and Strength-based  Summary: Zaiyah Tank is a 29 y.o. female who presents with affect wnl.  pt reported that she gained a lot from IOP. Pt reported that has been using journaling w/ positive outcomes. Pt discussed stressor of move- have to be out of rental by end of month, have found a house to buy, first time home buyers and having to move in temporarily w/ her in laws.  Pt discussed that she is cautiously optimistic about house looking to buy- currently looking to get preapproved and know buying a home is process.  Pt reported that also living w/ in laws will be a challenge w/limited space and tension at times w/ mother in law.  Pt had good perspective of how this will be a challenge and stress on both sides.  Pt also discussed ways to get break and own family time as  needed. Pt reported she has started back to school at [employer] and taking one class to not overwhelm self.   Suicidal/Homicidal: Nowithout intent/plan  Therapist Response: Assessed pt current functioning per pt report.  Processed w/pt coping w/ stressors and upcoming transition w/ living.  Explored w/pt use of coping skills and planned individual family time during stressful time.   Plan: Return again in 1 week, via webex.  F/u as scheduled w/ Dr. Montel Culver.  Diagnosis: Bipolar 1 d/o and GAD   Jan Fireman [facility] 09/09/2019

## 2019-09-10 ENCOUNTER — Ambulatory Visit (HOSPITAL_COMMUNITY): Payer: 59

## 2019-09-11 ENCOUNTER — Ambulatory Visit (HOSPITAL_COMMUNITY): Payer: 59

## 2019-09-12 ENCOUNTER — Ambulatory Visit (HOSPITAL_COMMUNITY): Payer: 59

## 2019-09-15 ENCOUNTER — Ambulatory Visit (HOSPITAL_COMMUNITY): Payer: 59

## 2019-09-16 ENCOUNTER — Other Ambulatory Visit: Payer: Self-pay

## 2019-09-16 ENCOUNTER — Ambulatory Visit (INDEPENDENT_AMBULATORY_CARE_PROVIDER_SITE_OTHER): Payer: 59 | Admitting: Psychiatry

## 2019-09-16 ENCOUNTER — Ambulatory Visit (HOSPITAL_COMMUNITY): Payer: 59 | Admitting: Psychology

## 2019-09-16 DIAGNOSIS — F411 Generalized anxiety disorder: Secondary | ICD-10-CM

## 2019-09-16 DIAGNOSIS — F3181 Bipolar II disorder: Secondary | ICD-10-CM

## 2019-09-16 DIAGNOSIS — F4010 Social phobia, unspecified: Secondary | ICD-10-CM | POA: Diagnosis not present

## 2019-09-16 DIAGNOSIS — F410 Panic disorder [episodic paroxysmal anxiety] without agoraphobia: Secondary | ICD-10-CM | POA: Diagnosis not present

## 2019-09-16 MED ORDER — TRAZODONE HCL 50 MG PO TABS: 50.0000 mg | ORAL_TABLET | Freq: Every evening | ORAL | 2 refills | Status: DC | PRN

## 2019-09-16 MED ORDER — BUPROPION HCL ER (XL) 300 MG PO TB24: 300.0000 mg | ORAL_TABLET | Freq: Every day | ORAL | 2 refills | Status: DC

## 2019-09-16 MED ORDER — LURASIDONE HCL 20 MG PO TABS: 20.0000 mg | ORAL_TABLET | Freq: Every day | ORAL | 2 refills | Status: DC

## 2019-09-16 MED ORDER — BUSPIRONE HCL 15 MG PO TABS: 15.0000 mg | ORAL_TABLET | Freq: Two times a day (BID) | ORAL | 2 refills | Status: DC
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
## 2019-09-17 ENCOUNTER — Ambulatory Visit (INDEPENDENT_AMBULATORY_CARE_PROVIDER_SITE_OTHER): Payer: 59 | Admitting: Psychology

## 2019-09-17 ENCOUNTER — Other Ambulatory Visit: Payer: Self-pay

## 2019-09-17 DIAGNOSIS — F411 Generalized anxiety disorder: Secondary | ICD-10-CM

## 2019-09-17 DIAGNOSIS — F3181 Bipolar II disorder: Secondary | ICD-10-CM | POA: Diagnosis not present

## 2019-09-17 NOTE — Progress Notes (Signed)
Virtual Visit via Video Note  I connected with Nicole Rodriguez on 09/17/19 at 11:00 AM EDT by a video enabled telemedicine application and verified that I am speaking with the correct person using two identifiers.   I discussed the limitations of evaluation and management by telemedicine and the availability of in person appointments. The patient expressed understanding and agreed to proceed.    I discussed the assessment and treatment plan with the patient. The patient was provided an opportunity to ask questions and all were answered. The patient agreed with the plan and demonstrated an understanding of the instructions.   The patient was advised to call back or seek an in-person evaluation if the symptoms worsen or if the condition fails to improve as anticipated.  I provided 35 minutes of non-face-to-face time during this encounter.   Jan Fireman [facility]    THERAPIST PROGRESS NOTE  Session Time: 11.02am-11.37am  Participation Level: Active  Behavioral Response: Well GroomedAlertaffect wnl  Type of Therapy: Individual Therapy  Treatment Goals addressed: Diagnosis: Bipolar 1 d/o and GAD and goal 1.  Interventions: CBT and Strength-based  Summary: Nicole Rodriguez is a 29 y.o. female who presents with affect wnl.  pt reported that she has a return date for work on 10/06/19 and feels about this. Pt reported that she continues to feel improved w/her mood and ability to manage her stress.  Pt reports that still deals w/ some anxiety but is manageable.  Pt reported that was anxious about return worried about her job security despite [employer] and what others were thinking of her situation.  Pt reported that after talking w/ her supervisor she was able to challenge her distortions and reframe.  Pt reports that she is making good progress w/ the packing to move.  Pt reported that she continues to use her journaling to express thoughts and moods instead of stuff them and walking has been helpful  for self care as well. .   Suicidal/Homicidal: Nowithout intent/plan  Therapist Response: Assessed pt current functioning per pt report.  Processed w/pt coping w/ stressors and preparing for transitions.  Explored w/pt anxious thoughts, distortions, challenges and reframes.  Discussed w/ pt use of her coping skills and positives for her.   Plan: Return again in 1 week, via webex.  F/u as scheduled w/ dr. Miquel Dunn.   Diagnosis: Bipolar 1 d/o and GAD   Jan Fireman [facility] 09/17/2019

## 2019-09-19 ENCOUNTER — Ambulatory Visit: Payer: Self-pay | Admitting: Emergency Medicine

## 2019-09-21 ENCOUNTER — Emergency Department (HOSPITAL_COMMUNITY)
Admission: EM | Admit: 2019-09-21 | Discharge: 2019-09-21 | Discharge status: Against Medical Advice | Payer: 59 | Source: Home / Self Care

## 2019-09-21 ENCOUNTER — Encounter (HOSPITAL_COMMUNITY): Payer: Self-pay | Admitting: Emergency Medicine

## 2019-09-21 ENCOUNTER — Emergency Department (HOSPITAL_COMMUNITY)
Admission: EM | Admit: 2019-09-21 | Discharge: 2019-09-21 | Disposition: A | Discharge status: Home / Self Care | Payer: 59 | Attending: Emergency Medicine | Admitting: Emergency Medicine

## 2019-09-21 ENCOUNTER — Emergency Department (HOSPITAL_COMMUNITY): Payer: 59

## 2019-09-21 ENCOUNTER — Other Ambulatory Visit: Payer: Self-pay

## 2019-09-21 DIAGNOSIS — K625 Hemorrhage of anus and rectum: Secondary | ICD-10-CM | POA: Insufficient documentation

## 2019-09-21 DIAGNOSIS — K529 Noninfective gastroenteritis and colitis, unspecified: Secondary | ICD-10-CM | POA: Diagnosis not present

## 2019-09-21 DIAGNOSIS — R1030 Lower abdominal pain, unspecified: Secondary | ICD-10-CM | POA: Diagnosis present

## 2019-09-21 DIAGNOSIS — J45909 Unspecified asthma, uncomplicated: Secondary | ICD-10-CM | POA: Diagnosis not present

## 2019-09-21 HISTORY — DX: Irritable bowel syndrome, unspecified: K58.9

## 2019-09-21 LAB — COMPREHENSIVE METABOLIC PANEL
ALT: 17 U/L (ref 0–44)
AST: 16 U/L (ref 15–41)
Albumin: 4.3 g/dL (ref 3.5–5.0)
Alkaline Phosphatase: 56 U/L (ref 38–126)
Anion gap: 8 (ref 5–15)
BUN: 13 mg/dL (ref 6–20)
CO2: 24 mmol/L (ref 22–32)
Calcium: 9.2 mg/dL (ref 8.9–10.3)
Chloride: 104 mmol/L (ref 98–111)
Creatinine, Ser: 0.79 mg/dL (ref 0.44–1.00)
GFR calc Af Amer: >60 mL/min (ref >60)
GFR calc non Af Amer: >60 mL/min (ref >60)
Glucose, Bld: 105 mg/dL — ABNORMAL HIGH (ref 70–99)
Potassium: 3.6 mmol/L (ref 3.5–5.1)
Sodium: 136 mmol/L (ref 135–145)
Total Bilirubin: 0.6 mg/dL (ref 0.3–1.2)
Total Protein: 7.3 g/dL (ref 6.5–8.1)

## 2019-09-21 LAB — URINALYSIS, ROUTINE W REFLEX MICROSCOPIC
Bilirubin Urine: NEGATIVE
Glucose, UA: NEGATIVE mg/dL
Hgb urine dipstick: NEGATIVE
Ketones, ur: NEGATIVE mg/dL
Leukocytes,Ua: NEGATIVE
Nitrite: NEGATIVE
Protein, ur: 30 mg/dL — AB
Specific Gravity, Urine: 1.03 (ref 1.005–1.030)
pH: 6 (ref 5.0–8.0)

## 2019-09-21 LAB — CBC
HCT: 38.6 % (ref 36.0–46.0)
Hemoglobin: 13.1 g/dL (ref 12.0–15.0)
MCH: 30 pg (ref 26.0–34.0)
MCHC: 33.9 g/dL (ref 30.0–36.0)
MCV: 88.5 fL (ref 80.0–100.0)
Platelets: 323 10*3/uL (ref 150–400)
RBC: 4.36 MIL/uL (ref 3.87–5.11)
RDW: 12.7 % (ref 11.5–15.5)
WBC: 12.7 10*3/uL — ABNORMAL HIGH (ref 4.0–10.5)
nRBC: 0 % (ref 0.0–0.2)

## 2019-09-21 LAB — PREGNANCY, URINE: Preg Test, Ur: NEGATIVE

## 2019-09-21 LAB — POC OCCULT BLOOD, ED: Fecal Occult Bld: POSITIVE — AB

## 2019-09-21 LAB — LIPASE, BLOOD: Lipase: 24 U/L (ref 11–51)

## 2019-09-21 MED ORDER — DICYCLOMINE HCL 20 MG PO TABS: 20.0000 mg | ORAL_TABLET | Freq: Two times a day (BID) | ORAL | 0 refills | Status: DC

## 2019-09-21 MED ORDER — CIPROFLOXACIN HCL 500 MG PO TABS: 500.0000 mg | ORAL_TABLET | Freq: Two times a day (BID) | ORAL | 0 refills | Status: DC

## 2019-09-21 MED ORDER — METRONIDAZOLE 500 MG PO TABS: 500.0000 mg | ORAL_TABLET | Freq: Two times a day (BID) | ORAL | 0 refills | Status: DC

## 2019-09-21 MED ORDER — IOHEXOL 300 MG/ML  SOLN
100.0000 mL | Freq: Once | INTRAMUSCULAR | Status: AC | PRN
  Administered 2019-09-21: 100 mL via INTRAVENOUS

## 2019-09-21 NOTE — Discharge Instructions (Signed)
Get help right away if: You have chest pain. You feel extremely weak or you faint. The blood in your diarrhea increases or turns a different color. You vomit and the vomit is bloody or looks black. You have persistent diarrhea. You have severe pain, cramping, or bloating in your abdomen. You have trouble breathing or you are breathing very quickly. Your heart is beating very quickly. Your skin feels cold and clammy. You feel confused. You have signs of dehydration, such as: Dark urine, very little urine, or no urine. Cracked lips. Dry mouth. Sunken eyes. Sleepiness. Weakness. 

## 2019-09-21 NOTE — ED Triage Notes (Signed)
Patient reports mid abdominal pain today with nausea and bloody stools , seen at [facility] ER this afternoon - CT abdomen/Blood tests and urine test done , discharge with prescriptions . Denies emesis or fever .

## 2019-09-21 NOTE — ED Triage Notes (Signed)
Patient c/o lower abd pain that started today with diarrhea and nausea. Denies any fevers or urinary symptoms. Per patient bright red blood noted in stool. Per patient last BM just blood.

## 2019-09-21 NOTE — ED Provider Notes (Signed)
[facility] Provider Note   CSN: [id number] Arrival date & time: 09/21/19  1528     History Chief Complaint  Patient presents with  . Abdominal Pain    Nicole Rodriguez is a 29 y.o. female who  has a past medical history of Asthma and IBS (irritable bowel syndrome).  She presents with onset of abdominal pain and hematochezia. The patient states that she began having persistent pain in her lower abdomen this morning. She made a bm and noticed some blood around the stool. She had another BM with blood, followed by a BM which was only blood and mucous. She has never had anything like this before. She denies fevers, chills, recent foreign travel, ingestion of suspicious foods.  No one in the house has the same symptoms.  She denies urinary symptoms.  She has no history of inflammatory bowel disease  HPI     Past Medical History:  Diagnosis Date  . Asthma   . IBS (irritable bowel syndrome)     Patient Active Problem List   Diagnosis Date Noted  . GAD (generalized anxiety disorder) 07/19/2019  . Social anxiety disorder 07/19/2019  . Panic disorder 07/19/2019  . Bipolar 2 disorder ([place]) 07/19/2019  . Plantar fasciitis 10/15/2018  . Chest pain 10/10/2018  . Allergic rhinitis 08/19/2018  . GERD (gastroesophageal reflux disease) 08/19/2018  . Chronic cough 11/29/2017  . Asthma 11/29/2017  . Breast mass, right 03/18/2014  . Numbness and tingling in both hands 01/26/2014  . Neck pain 10/31/2010    Past Surgical History:  Procedure Laterality Date  . APPENDECTOMY    . BREAST LUMPECTOMY    . FOOT SURGERY       OB History    Gravida  0   Para  0   Term  0   Preterm  0   AB  0   Living  0     SAB  0   TAB  0   Ectopic  0   Multiple  0   Live Births  0           Family History  Problem Relation Age of Onset  . Asthma Father   . Diabetes Other   . Heart failure Other   . Anxiety disorder Maternal Grandmother   . Depression  Maternal Grandmother     Social History   Tobacco Use  . Smoking status: Never Smoker  . Smokeless tobacco: Never Used  Substance Use Topics  . Alcohol use: Yes    Comment: socially   . Drug use: No    Home Medications Prior to Admission medications   Medication Sig Start Date End Date Taking? Authorizing Provider  albuterol (PROVENTIL) (2.5 MG/3ML) 0.083% nebulizer solution Take 2.5 mg by nebulization every 6 (six) hours as needed for wheezing or shortness of breath.    [provider]  Albuterol Sulfate (PROAIR RESPICLICK) [id number] (90 Base) MCG/ACT AEPB Inhale 1-2 puffs into the lungs every 6 (six) hours as needed (shortness of breath).    [provider]  buPROPion (WELLBUTRIN XL) 300 MG 24 hr tablet Take 1 tablet (300 mg total) by mouth daily. 09/16/19   Pucilowski, Marchia Bond, MD  busPIRone (BUSPAR) 15 MG tablet Take 1 tablet (15 mg total) by mouth 2 (two) times daily. 09/16/19 12/15/19  Pucilowski, Marchia Bond, MD  ciclopirox (PENLAC) 8 % solution Apply topically at bedtime. Apply over nail and surrounding skin. Apply daily over previous coat. After seven (7) days,  may remove with alcohol and continue cycle. 10/14/18   Trula Slade, DPM  doxycycline (VIBRAMYCIN) 100 MG capsule Take 1 capsule (100 mg total) by mouth 2 (two) times daily. 07/29/19   Malachy Mood, MD  fluticasone (FLONASE) 50 MCG/ACT nasal spray Place 1 spray into both nostrils daily. 02/28/19   Collene Gobble, MD  lamoTRIgine (LAMICTAL) 200 MG tablet Take 1 tablet (200 mg total) by mouth at bedtime. 08/18/19 11/16/19  Pucilowski, Marchia Bond, MD  levocetirizine (XYZAL) 5 MG tablet TAKE 1 TABLET(5 MG) BY MOUTH EVERY EVENING 06/30/19   Byrum, Rose Fillers, MD  LORazepam (ATIVAN) 0.5 MG tablet Take 1 tablet (0.5 mg total) by mouth 2 (two) times daily as needed for anxiety. 08/18/19 11/16/19  Pucilowski, Marchia Bond, MD  lurasidone (LATUDA) 20 MG TABS tablet Take 1 tablet (20 mg total) by mouth daily after supper.  09/16/19 12/15/19  Pucilowski, Marchia Bond, MD  meloxicam (MOBIC) 15 MG tablet Take 1 tablet (15 mg total) by mouth daily. 11/07/18 11/07/19  Trula Slade, DPM  montelukast (SINGULAIR) 10 MG tablet TAKE 1 TABLET(10 MG) BY MOUTH AT BEDTIME 05/05/19   Collene Gobble, MD  pantoprazole (PROTONIX) 40 MG tablet Take 1 tablet (40 mg total) by mouth 2 (two) times daily. 08/18/19   Collene Gobble, MD  SYMBICORT 160-4.5 MCG/ACT inhaler INHALE 2 PUFF PO BID 02/27/19   [provider]  traZODone (DESYREL) 50 MG tablet Take 1 tablet (50 mg total) by mouth at bedtime as needed for sleep. 09/16/19 12/15/19  Pucilowski, Marchia Bond, MD    Allergies    Cefoxitin, Amoxicillin, Asa [aspirin], and Sulfa antibiotics  Review of Systems   Review of Systems Ten systems reviewed and are negative for acute change, except as noted in the HPI.   Physical Exam Updated Vital Signs BP (!) 144/91 (BP Location: Right Arm)   Pulse 100   Temp 97.8 F (36.6 C) (Oral)   Resp 18   Ht 5\' 2"  (1.575 m)   Wt 84.8 kg   LMP 09/14/2019   SpO2 100%   BMI 34.20 kg/m   Physical Exam Vitals and nursing note reviewed.  Constitutional:      General: She is not in acute distress.    Appearance: She is well-developed. She is not diaphoretic.  HENT:     Head: Normocephalic and atraumatic.  Eyes:     General: No scleral icterus.    Conjunctiva/sclera: Conjunctivae normal.  Cardiovascular:     Rate and Rhythm: Normal rate and regular rhythm.     Heart sounds: Normal heart sounds. No murmur. No friction rub. No gallop.   Pulmonary:     Effort: Pulmonary effort is normal. No respiratory distress.     Breath sounds: Normal breath sounds.  Abdominal:     General: Bowel sounds are normal. There is no distension.     Palpations: Abdomen is soft. There is no mass.     Tenderness: There is abdominal tenderness in the right lower quadrant, suprapubic area and left lower quadrant. There is no guarding.  Genitourinary:     Comments: Digital Rectal Exam reveals sphincter with good tone. No external hemorrhoids. No masses or fissures. Stool color is brown and red on finger..  Musculoskeletal:     Cervical back: Normal range of motion.  Skin:    General: Skin is warm and dry.  Neurological:     Mental Status: She is alert and oriented to person, place, and time.  Psychiatric:  Behavior: Behavior normal.     ED Results / Procedures / Treatments   Labs (all labs ordered are listed, but only abnormal results are displayed) Labs Reviewed  COMPREHENSIVE METABOLIC PANEL - Abnormal; Notable for the following components:      Result Value   Glucose, Bld 105 (*)    All other components within normal limits  CBC - Abnormal; Notable for the following components:   WBC 12.7 (*)    All other components within normal limits  URINALYSIS, ROUTINE W REFLEX MICROSCOPIC - Abnormal; Notable for the following components:   APPearance HAZY (*)    Protein, ur 30 (*)    Bacteria, UA FEW (*)    All other components within normal limits  LIPASE, BLOOD  PREGNANCY, URINE    EKG None  Radiology No results found.  Procedures Procedures (including critical care time)  Medications Ordered in ED Medications - No data to display  ED Course  I have reviewed the triage vital signs and the nursing notes.  Pertinent labs & imaging results that were available during my care of the patient were reviewed by me and considered in my medical decision making (see chart for details).    MDM Rules/Calculators/A&P                     Final Clinical Impression(s) / ED Diagnoses Final diagnoses:  None   [id number] VS: BP (!) 144/91 (BP Location: Right Arm)   Pulse 100   Temp 97.8 F (36.6 C) (Oral)   Resp 18   Ht 5\' 2"  (1.575 m)   Wt 84.8 kg   LMP 09/14/2019   SpO2 100%   BMI 34.20 kg/m   [id number] is gathered by patient and emr, girlfriend at bedside. DDX:The differential diagnosis of diarrhea includes  but is not limited to Viral- norovirus/rotavirus; Bacterial-Campylobacter,Shigella, Salmonella, Escherichia coli, E. coli 0157:H7, Yersinia enterocolitica, Vibrio cholerae, Clostridium difficile. Parasitic- Giardia lamblia, Cryptosporidium,Entamoeba histolytica,Cyclospora, Microsporidium. Toxin- Staphylococcus aureus, Bacillus cereus. Noninfectious causes include GI Bleed, Appendicitis, Mesenteric Ischemia, Diverticulitis, Adrenal Crisis, Thyroid Storm, Toxicologic exposures, Antibiotic or drug-associated, inflammatory bowel disease. Labs: I reviewed the labs which show fobt + for blood, + Leukocytosis, UA without evidence of infection, Negative urine preg, hyperglycemia of insignificant value Imaging: I personally reviewed the images (CT Abd/ Pel w contrast) which show(s) are of the transverse colon with distension vs. Inflammation consistent with colitis on my interpretation. EKG: N/A [id number] here with hematochezia. She will be treated for infectious colitis with cipro/flagyl given her white count, suspicion for colitis on CT and hematochezia. She has a hs if IBS-C and may have irritation of internal hemorrhoids. Patient's pain is minimal and she is afebrile and HDS. Discussed return precautions Patient disposition:good Patient condition: . The patient appears reasonably screened and/or stabilized for discharge and I doubt any other medical condition or other [facility] requiring further screening, evaluation, or treatment in the ED at this time prior to discharge. I have discussed lab and/or imaging findings with the patient and answered all questions/concerns to the best of my ability. I have discussed return precautions and OP follow up.     Rx / DC Orders ED Discharge Orders    None       Margarita Mail, PA-C 09/22/19 1531    Milton Ferguson, MD 09/23/19 1028

## 2019-09-22 ENCOUNTER — Telehealth: Payer: Self-pay

## 2019-09-22 NOTE — Telephone Encounter (Signed)
Can we accept her as a new patient? ER referral.

## 2019-09-22 NOTE — Telephone Encounter (Signed)
Pt is aware of her OV for this Wednesday at 130 pm

## 2019-09-22 NOTE — Telephone Encounter (Signed)
Ok to schedule ov.  

## 2019-09-23 ENCOUNTER — Encounter (HOSPITAL_COMMUNITY): Payer: Self-pay | Admitting: Psychology

## 2019-09-23 ENCOUNTER — Ambulatory Visit (HOSPITAL_COMMUNITY): Payer: 59 | Admitting: Psychology

## 2019-09-23 ENCOUNTER — Other Ambulatory Visit: Payer: Self-pay

## 2019-09-23 NOTE — Progress Notes (Signed)
Nicole Rodriguez is a 29 y.o. female patient who didn't show for virtual appointment.  Informed by email and of future appointment already scheduled.  Marland Kitchen        Jan Fireman, [facility]

## 2019-09-24 ENCOUNTER — Encounter: Payer: Self-pay | Admitting: Gastroenterology

## 2019-09-24 ENCOUNTER — Encounter: Payer: Self-pay | Admitting: *Deleted

## 2019-09-24 ENCOUNTER — Telehealth: Payer: Self-pay | Admitting: *Deleted

## 2019-09-24 ENCOUNTER — Other Ambulatory Visit: Payer: Self-pay

## 2019-09-24 ENCOUNTER — Ambulatory Visit: Payer: 59 | Admitting: Gastroenterology

## 2019-09-24 DIAGNOSIS — R197 Diarrhea, unspecified: Secondary | ICD-10-CM

## 2019-09-24 DIAGNOSIS — K625 Hemorrhage of anus and rectum: Secondary | ICD-10-CM | POA: Diagnosis not present

## 2019-09-24 DIAGNOSIS — R933 Abnormal findings on diagnostic imaging of other parts of digestive tract: Secondary | ICD-10-CM | POA: Diagnosis not present

## 2019-09-24 HISTORY — DX: Hemorrhage of anus and rectum: K62.5

## 2019-09-24 MED ORDER — CLENPIQ 10-3.5-12 MG-GM -GM/160ML PO SOLN: 1.0000 | Freq: Once | ORAL | 0 refills | Status: AC

## 2019-09-24 NOTE — Patient Instructions (Addendum)
Colonoscopy as scheduled. See separate instructions.   Complete Cipro/Flagyl. If you feels like you are no better or worse, call before noon on Friday.   For the next couple of days, consume bland food. No spicy, greasy, fatty foods. See handout.    Food Choices to Help Relieve Diarrhea, Adult When you have diarrhea, the foods you eat and your eating habits are very important. Choosing the right foods and drinks can help:  Relieve diarrhea.  Replace lost fluids and nutrients.  Prevent dehydration. What general guidelines should I follow?  Relieving diarrhea  Choose foods with less than 2 g or .07 oz. of fiber per serving.  Limit fats to less than 8 tsp (38 g or 1.34 oz.) a day.  Avoid the following: ? Foods and beverages sweetened with high-fructose corn syrup, honey, or sugar alcohols such as xylitol, sorbitol, and mannitol. ? Foods that contain a lot of fat or sugar. ? Fried, greasy, or spicy foods. ? High-fiber grains, breads, and cereals. ? Raw fruits and vegetables.  Eat foods that are rich in probiotics. These foods include dairy products such as yogurt and fermented milk products. They help increase healthy bacteria in the stomach and intestines (gastrointestinal tract, or GI tract).  If you have lactose intolerance, avoid dairy products. These may make your diarrhea worse.  Take medicine to help stop diarrhea (antidiarrheal medicine) only as told by your health care provider. Replacing nutrients  Eat small meals or snacks every 3-4 hours.  Eat bland foods, such as white rice, toast, or baked potato, until your diarrhea starts to get better. Gradually reintroduce nutrient-rich foods as tolerated or as told by your health care provider. This includes: ? Well-cooked protein foods. ? Peeled, seeded, and soft-cooked fruits and vegetables. ? Low-fat dairy products.  Take vitamin and mineral supplements as told by your health care provider. Preventing  dehydration  Start by sipping water or a special solution to prevent dehydration (oral rehydration solution, ORS). Urine that is clear or pale yellow means that you are getting enough fluid.  Try to drink at least 8-10 cups of fluid each day to help replace lost fluids.  You may add other liquids in addition to water, such as clear juice or decaffeinated sports drinks, as tolerated or as told by your health care provider.  Avoid drinks with caffeine, such as coffee, tea, or soft drinks.  Avoid alcohol. What foods are recommended?     The items listed may not be a complete list. Talk with your health care provider about what dietary choices are best for you. Grains White rice. White, [place], or pita breads (fresh or toasted), including plain rolls, buns, or bagels. White pasta. Saltine, soda, or graham crackers. Pretzels. Low-fiber cereal. Cooked cereals made with water (such as cornmeal, farina, or cream cereals). Plain muffins. Matzo. Melba toast. Zwieback. Vegetables Potatoes (without the skin). Most well-cooked and canned vegetables without skins or seeds. Tender lettuce. Fruits Apple sauce. Fruits canned in juice. Cooked apricots, cherries, grapefruit, peaches, pears, or plums. Fresh bananas and cantaloupe. Meats and other protein foods Baked or boiled chicken. Eggs. Tofu. Fish. Seafood. Smooth nut butters. Ground or well-cooked tender beef, ham, veal, lamb, pork, or poultry. Dairy Plain yogurt, kefir, and unsweetened liquid yogurt. Lactose-free milk, buttermilk, skim milk, or soy milk. Low-fat or nonfat hard cheese. Beverages Water. Low-calorie sports drinks. Fruit juices without pulp. Strained tomato and vegetable juices. Decaffeinated teas. Sugar-free beverages not sweetened with sugar alcohols. Oral rehydration solutions, if approved by  your health care provider. Seasoning and other foods Bouillon, broth, or soups made from recommended foods. What foods are not recommended? The  items listed may not be a complete list. Talk with your health care provider about what dietary choices are best for you. Grains Whole grain, whole wheat, bran, or rye breads, rolls, pastas, and crackers. Wild or brown rice. Whole grain or bran cereals. Barley. Oats and oatmeal. Corn tortillas or taco shells. Granola. Popcorn. Vegetables Raw vegetables. Fried vegetables. Cabbage, broccoli, Brussels sprouts, artichokes, baked beans, beet greens, corn, kale, legumes, peas, sweet potatoes, and yams. Potato skins. Cooked spinach and cabbage. Fruits Dried fruit, including raisins and dates. Raw fruits. Stewed or dried prunes. Canned fruits with syrup. Meat and other protein foods Fried or fatty meats. Deli meats. Chunky nut butters. Nuts and seeds. Beans and lentils. Berniece Salines. Hot dogs. Sausage. Dairy High-fat cheeses. Whole milk, chocolate milk, and beverages made with milk, such as milk shakes. Half-and-half. Cream. sour cream. Ice cream. Beverages Caffeinated beverages (such as coffee, tea, soda, or energy drinks). Alcoholic beverages. Fruit juices with pulp. Prune juice. Soft drinks sweetened with high-fructose corn syrup or sugar alcohols. High-calorie sports drinks. Fats and oils Butter. Cream sauces. Margarine. Salad oils. Plain salad dressings. Olives. Avocados. Mayonnaise. Sweets and desserts Sweet rolls, doughnuts, and sweet breads. Sugar-free desserts sweetened with sugar alcohols such as xylitol and sorbitol. Seasoning and other foods Honey. Hot sauce. Chili powder. Gravy. Cream-based or milk-based soups. Pancakes and waffles. Summary  When you have diarrhea, the foods you eat and your eating habits are very important.  Make sure you get at least 8-10 cups of fluid each day, or enough to keep your urine clear or pale yellow.  Eat bland foods and gradually reintroduce healthy, nutrient-rich foods as tolerated, or as told by your health care provider.  Avoid high-fiber, fried, greasy, or  spicy foods. This information is not intended to replace advice given to you by your health care provider. Make sure you discuss any questions you have with your health care provider. Document Revised: 10/10/2018 Document Reviewed: 06/16/2016 Elsevier Patient Education  [place].

## 2019-09-24 NOTE — Progress Notes (Signed)
Primary Care Physician:  Neale Burly, MD  Primary Gastroenterologist:  Garfield Cornea, MD   Chief Complaint  Patient presents with  . Abdominal Pain    mid abd, constant, worse after eating  . Diarrhea    after eating  . Nausea    comes and goes, worse after eating  . Rectal Bleeding    bright red    HPI:  Nicole Rodriguez is a 29 y.o. female here for further evaluation of acute onset abdominal pain, nausea, diarrhea, rectal bleeding.   H/o IBS-C. At baseline may have some cramping and then loose stool once or twice but then fine.  This past Sunday, she developed abdominal cramping, urge for bowel movement but could not go. Cramps worse. Then diarrhea. Couple of more episodes of diarrhea before having blood in stool. Associated with nausea. No vomiting. She had CT showing  majority of the colon is underdistended which limits evaluation, there may be some mild colonic wall thickening, can also be seen with infectious or inflammatory colitis.  Nonobstructing right-sided nephrolithiasis. She had mild leukocytosis, normal Hgb. On DRE, fresh blood noted.   She had been on Cipro/Flagyl and bentyl for 3 days. Abdominal pain (generalized) and diarrhea persistent, worse with meals. Less blood in stool. Bentyl helps some with cramps. No vomiting. No ill contacts. No suspicious foods. No fever, cough, congestion, sore throat. No chickens at home, no well water. Still has diarrhea after every time eats. No nocturnal diarrhea. Less blood in stool but otherwise amount of stool/consistentcy is same.   Current Outpatient Medications  Medication Sig Dispense Refill  . albuterol (PROVENTIL) (2.5 MG/3ML) 0.083% nebulizer solution Take 2.5 mg by nebulization every 6 (six) hours as needed for wheezing or shortness of breath.    . Albuterol Sulfate (PROAIR RESPICLICK) 123XX123 (90 Base) MCG/ACT AEPB Inhale 1-2 puffs into the lungs every 6 (six) hours as needed (shortness of breath).    Marland Kitchen buPROPion (WELLBUTRIN XL)  300 MG 24 hr tablet Take 1 tablet (300 mg total) by mouth daily. 30 tablet 2  . busPIRone (BUSPAR) 15 MG tablet Take 1 tablet (15 mg total) by mouth 2 (two) times daily. 60 tablet 2  . ciclopirox (PENLAC) 8 % solution Apply topically at bedtime. Apply over nail and surrounding skin. Apply daily over previous coat. After seven (7) days, may remove with alcohol and continue cycle. 6.6 mL 4  . ciprofloxacin (CIPRO) 500 MG tablet Take 1 tablet (500 mg total) by mouth 2 (two) times daily. One po bid x 7 days 14 tablet 0  . dicyclomine (BENTYL) 20 MG tablet Take 1 tablet (20 mg total) by mouth 2 (two) times daily. 20 tablet 0  . fluticasone (FLONASE) 50 MCG/ACT nasal spray Place 1 spray into both nostrils daily. 16 g 11  . lamoTRIgine (LAMICTAL) 200 MG tablet Take 1 tablet (200 mg total) by mouth at bedtime. 30 tablet 2  . levocetirizine (XYZAL) 5 MG tablet TAKE 1 TABLET(5 MG) BY MOUTH EVERY EVENING 30 tablet 2  . LORazepam (ATIVAN) 0.5 MG tablet Take 1 tablet (0.5 mg total) by mouth 2 (two) times daily as needed for anxiety. 60 tablet 2  . lurasidone (LATUDA) 20 MG TABS tablet Take 1 tablet (20 mg total) by mouth daily after supper. 30 tablet 2  . metroNIDAZOLE (FLAGYL) 500 MG tablet Take 1 tablet (500 mg total) by mouth 2 (two) times daily. One po bid x 7 days 14 tablet 0  . montelukast (SINGULAIR) 10 MG tablet  TAKE 1 TABLET(10 MG) BY MOUTH AT BEDTIME 30 tablet 5  . pantoprazole (PROTONIX) 40 MG tablet Take 1 tablet (40 mg total) by mouth 2 (two) times daily. 60 tablet 0  . SYMBICORT 160-4.5 MCG/ACT inhaler INHALE 2 PUFF PO BID    . traZODone (DESYREL) 50 MG tablet Take 1 tablet (50 mg total) by mouth at bedtime as needed for sleep. 30 tablet 2   Current Facility-Administered Medications  Medication Dose Route Frequency Provider Last Rate Last Admin  . triamcinolone acetonide (KENALOG) 10 MG/ML injection 10 mg  10 mg Other Once Trula Slade, DPM        Allergies as of 09/24/2019 - Review  Complete 09/24/2019  Allergen Reaction Noted  . Cefoxitin Itching and Rash 06/03/2016  . Amoxicillin Hives 10/06/2014  . Asa [aspirin] Hives 10/06/2014  . Sulfa antibiotics Hives 10/06/2014    Past Medical History:  Diagnosis Date  . Asthma   . Bipolar disorder ([place])   . Generalized anxiety disorder   . IBS (irritable bowel syndrome)   . Social anxiety disorder     Past Surgical History:  Procedure Laterality Date  . APPENDECTOMY    . BREAST LUMPECTOMY    . FOOT SURGERY      Family History  Problem Relation Age of Onset  . Asthma Father   . Diabetes Other   . Heart failure Other   . Anxiety disorder Maternal Grandmother   . Depression Maternal Grandmother   . Crohn's disease Sister        half sister  . Diverticulitis Paternal Grandmother   . Colon cancer Neg Hx   . Celiac disease Neg Hx     Social History   Socioeconomic History  . Marital status: Married    Spouse name: Not on file  . Number of children: 1  . Years of education: Not on file  . Highest education level: Not on file  Occupational History  . Not on file  Tobacco Use  . Smoking status: Never Smoker  . Smokeless tobacco: Never Used  Substance and Sexual Activity  . Alcohol use: Yes    Comment: socially   . Drug use: No  . Sexual activity: Yes    Birth control/protection: None  Other Topics Concern  . Not on file  Social History Narrative   Daughter 100 years old.   Social Determinants of Health   Financial Resource Strain:   . Difficulty of Paying Living Expenses:   Food Insecurity:   . Worried About Charity fundraiser in the Last Year:   . Arboriculturist in the Last Year:   Transportation Needs:   . Film/video editor (Medical):   Marland Kitchen Lack of Transportation (Non-Medical):   Physical Activity:   . Days of Exercise per Week:   . Minutes of Exercise per Session:   Stress:   . Feeling of Stress :   Social Connections:   . Frequency of Communication with Friends and Family:   .  Frequency of Social Gatherings with Friends and Family:   . Attends Religious Services:   . Active Member of Clubs or Organizations:   . Attends Archivist Meetings:   Marland Kitchen Marital Status:   Intimate Partner Violence:   . Fear of Current or Ex-Partner:   . Emotionally Abused:   Marland Kitchen Physically Abused:   . Sexually Abused:       ROS:  General: Negative for anorexia, weight loss, fever, chills, fatigue, weakness.  Eyes: Negative for vision changes.  ENT: Negative for hoarseness, difficulty swallowing , nasal congestion. CV: Negative for chest pain, angina, palpitations, dyspnea on exertion, peripheral edema.  Respiratory: Negative for dyspnea at rest, dyspnea on exertion, cough, sputum, wheezing.  GI: See history of present illness. GU:  Negative for dysuria, hematuria, urinary incontinence, urinary frequency, nocturnal urination.  MS: Negative for joint pain, low back pain.  Derm: Negative for rash or itching.  Neuro: Negative for weakness, abnormal sensation, seizure, frequent headaches, memory loss, confusion.  Psych: Negative for anxiety, depression, suicidal ideation, hallucinations.  Endo: Negative for unusual weight change.  Heme: Negative for bruising or bleeding. Allergy: Negative for rash or hives.    Physical Examination:  BP 131/82   Pulse 89   Temp (!) 97.1 F (36.2 C) (Temporal)   Ht 5\' 2"  (1.575 m)   Wt 183 lb 9.6 oz (83.3 kg)   LMP 09/14/2019 Comment: neg preg test 09/21/19  BMI 33.58 kg/m    General: Well-nourished, well-developed in no acute distress.  Head: Normocephalic, atraumatic.   Eyes: Conjunctiva pink, no icterus. Mouth: Oropharyngeal mucosa moist and pink , no lesions erythema or exudate. Neck: Supple without thyromegaly, masses, or lymphadenopathy.  Lungs: Clear to auscultation bilaterally.  Heart: Regular rate and rhythm, no murmurs rubs or gallops.  Abdomen: Bowel sounds are normal, mild generalized abdominal pain, nondistended, no  hepatosplenomegaly or masses, no abdominal bruits or hernia , no rebound or guarding.   Rectal: not performed Extremities: No lower extremity edema. No clubbing or deformities.  Neuro: Alert and oriented x 4 , grossly normal neurologically.  Skin: Warm and dry, no rash or jaundice.   Psych: Alert and cooperative, normal mood and affect.  Labs: Lab Results  Component Value Date   CREATININE 0.79 09/21/2019   BUN 13 09/21/2019   NA 136 09/21/2019   K 3.6 09/21/2019   CL 104 09/21/2019   CO2 24 09/21/2019   Lab Results  Component Value Date   WBC 12.7 (H) 09/21/2019   HGB 13.1 09/21/2019   HCT 38.6 09/21/2019   MCV 88.5 09/21/2019   PLT 323 09/21/2019   Lab Results  Component Value Date   ALT 17 09/21/2019   AST 16 09/21/2019   ALKPHOS 56 09/21/2019   BILITOT 0.6 09/21/2019   Lab Results  Component Value Date   LIPASE 24 09/21/2019   Heme positive stool  Imaging Studies: CT ABDOMEN PELVIS W CONTRAST  Result Date: 09/21/2019 CLINICAL DATA:  Lower abdominal pain. EXAM: CT ABDOMEN AND PELVIS WITH CONTRAST TECHNIQUE: Multidetector CT imaging of the abdomen and pelvis was performed using the standard protocol following bolus administration of intravenous contrast. CONTRAST:  119mL OMNIPAQUE IOHEXOL 300 MG/ML  SOLN COMPARISON:  10/11/2016 FINDINGS: Lower chest: The lung bases are clear. The heart size is normal. Hepatobiliary: The liver is normal. Normal gallbladder.There is no biliary ductal dilation. Pancreas: Normal contours without ductal dilatation. No peripancreatic fluid collection. Spleen: No splenic laceration or hematoma. Adrenals/Urinary Tract: --Adrenal glands: No adrenal hemorrhage. --Right kidney/ureter: There are punctate nonobstructing stones in the right kidney. There is no right-sided hydronephrosis. --Left kidney/ureter: No hydronephrosis or perinephric hematoma. --Urinary bladder: Bladder is decompressed and therefore poorly evaluated. Stomach/Bowel:  --Stomach/Duodenum: No hiatal hernia or other gastric abnormality. Normal duodenal course and caliber. --Small bowel: No dilatation or inflammation. --Colon: The majority of the colon is underdistended which limits evaluation. There may be some mild colonic wall thickening. --Appendix: Surgically absent. Vascular/Lymphatic: Normal course and caliber of the  major abdominal vessels. --No retroperitoneal lymphadenopathy. --No mesenteric lymphadenopathy. --No pelvic or inguinal lymphadenopathy. Reproductive: Unremarkable Other: No ascites or free air. The abdominal wall is normal. Musculoskeletal. No acute displaced fractures. IMPRESSION: 1. Questionable mild wall thickening of enlarged portion of the colon. This may be secondary to underdistention but can also be seen with an infectious or inflammatory colitis. 2. Nonobstructing right-sided nephrolithiasis. 3. Status post appendectomy. Electronically Signed   By: Constance Holster M.D.   On: 09/21/2019 18:06

## 2019-09-24 NOTE — Telephone Encounter (Signed)
PA approved via [facility] website for TCS. Auth# [id number] dates 12/18/2019-03/17/2020

## 2019-09-24 NOTE — Assessment & Plan Note (Signed)
29 year old female presenting for further evaluation of acute onset abdominal pain/cramping, diarrhea, bloody stool, nausea.  CT with questionable mild wall thickening of the colon versus underdistention.  Suspect infectious etiology.  Stool studies have not been requested, now 3 days of antibiotic therapy so we will hold off. However, if symptoms do not improve over the next several days, would consider at that time.  At baseline typically has IBS-C with intermittent cramping and diarrhea but usually does not last as long as this episode and usually no blood in the stool.   Would consider colonoscopy in the near future due to persisting rectal bleeding, questionable abnormality of the colon on CT given family history of Crohn's. Plan for deep sedation given polypharmacy.  I have discussed the risks, alternatives, benefits with regards to but not limited to the risk of reaction to medication, bleeding, infection, perforation and the patient is agreeable to proceed. Written consent to be obtained.

## 2019-09-25 NOTE — Progress Notes (Signed)
CC'ED TO PCP 

## 2019-09-26 ENCOUNTER — Telehealth: Payer: Self-pay | Admitting: Internal Medicine

## 2019-09-26 ENCOUNTER — Other Ambulatory Visit: Payer: Self-pay | Admitting: Obstetrics and Gynecology

## 2019-09-26 DIAGNOSIS — R109 Unspecified abdominal pain: Secondary | ICD-10-CM

## 2019-09-26 DIAGNOSIS — K921 Melena: Secondary | ICD-10-CM

## 2019-09-26 NOTE — Telephone Encounter (Signed)
Pt is taking Cipro and Flagyl and will complete it in 2 more days. When pt saw [facility] on Wednesday 09/24/2019, she was advised to continue. Pt is having a watery diarrhea after she eats, 3 times a day. Pts mid abdominal pain hurts more after she eats. When pt has a BM, the pain gets a little better but the pain is constant. After pt eats, the pain is at a level 8, when pt has a BM, the pain level goes to a 4. Pt is eating the Brat diet as directed by LSL and pt was asked to call back today if not improving

## 2019-09-26 NOTE — Telephone Encounter (Signed)
PATIENT CALLED AND SAID THAT HER DIARRHEA AND ABDOMINAL PAIN HAVE NOT RESOLVED, PLEASE CALL HER

## 2019-09-26 NOTE — Telephone Encounter (Signed)
[phone] patient called stating [facility] can get her in for a tcs faster and they would like [place] to fax them her information.  I told the patient would have to find out the protocol for that to be done, if we could do that

## 2019-09-29 ENCOUNTER — Encounter: Payer: Self-pay | Admitting: General Practice

## 2019-09-29 NOTE — Telephone Encounter (Signed)
[facility] is on Epic no need to fax records

## 2019-09-29 NOTE — Telephone Encounter (Signed)
Since patient is seeing another GI yes we should discharge officially from our practice.

## 2019-09-29 NOTE — Telephone Encounter (Signed)
Routing to RMR for discharge order from the practice

## 2019-09-29 NOTE — Telephone Encounter (Signed)
After looking in chart patient has called PCP for referral to [facility]. They have sent referral. I called endo and procedure cancelled. Does patient need a d/c letter? Fowarding to CM

## 2019-09-29 NOTE — Telephone Encounter (Signed)
Discharge letter mailed  

## 2019-09-29 NOTE — Telephone Encounter (Signed)
LMOVM for pt to call back. Is she cancelling procedure with [place] and transferring care?

## 2019-09-30 ENCOUNTER — Telehealth: Payer: Self-pay

## 2019-09-30 NOTE — Telephone Encounter (Signed)
Patient is experiencing blood in stool. Would like to be seen as soon as possible

## 2019-09-30 NOTE — Telephone Encounter (Signed)
Can you contact pt and offer a virtual/telephone appt with Dr. Bonna Gains? I do not have anything sooner with Dr. Allen Norris.

## 2019-10-01 ENCOUNTER — Ambulatory Visit: Payer: 59 | Admitting: Gastroenterology

## 2019-10-01 NOTE — Telephone Encounter (Signed)
Yes ma'am. Called patient an scheduled her with Dr. Darene Lamer for  10/02/19

## 2019-10-02 ENCOUNTER — Encounter: Payer: Self-pay | Admitting: Gastroenterology

## 2019-10-02 ENCOUNTER — Ambulatory Visit (INDEPENDENT_AMBULATORY_CARE_PROVIDER_SITE_OTHER): Payer: 59 | Admitting: Gastroenterology

## 2019-10-02 DIAGNOSIS — R197 Diarrhea, unspecified: Secondary | ICD-10-CM | POA: Diagnosis not present

## 2019-10-02 NOTE — Progress Notes (Signed)
[name] [address]  [place]  [place], [place] [address]  Main: [phone]  Fax: [phone]   Gastroenterology Consultation  Referring Provider:     Neale Burly, MD Primary Care Physician:  Neale Burly, MD Reason for Consultation:     Abdominal pain        HPI:   Virtual Visit via Telephone Note  I connected with patient on 10/02/19 at  2:45 PM EDT by telephone and verified that I am speaking with the correct person using two identifiers.   I discussed the limitations, risks, security and privacy concerns of performing an evaluation and management service by telephone and the availability of in person appointments. I also discussed with the patient that there may be a patient responsible charge related to this service. The patient expressed understanding and agreed to proceed.  Location of the patient: Home Location of provider: Home Participating persons: Patient and provider only   History of Present Illness: Chief Complaint  Patient presents with  . Abdominal Pain     Basia Schlender is a 29 y.o. y/o female referred for consultation & management  by Dr. Neale Burly, MD.  Patient reports having abdominal pain, with diarrhea and blood in stool that started to 3 weeks ago.  Due to the blood in stool she went to the ER with labs not showing any anemia.  Stool testing was not done.  Rectal exam in the ER reports brown stool with blood.  Patient was given antibiotics for presumed infectious causes.  Her symptoms are better and that she was having 10 loose bowel movements a day and is now having about 4 loose bowel movements a day.  Amount of blood has also decreased from large amount of blood to blood streaks only.  No fever or chills.  Abdominal pain is periumbilical, sharp, with no radiation.  CT abdomen did not show any appendicitis and reported under distention of the colon.  No prior colonoscopy.  No family history of colon cancer.  No nausea or  vomiting or dysphagia.  Past Medical History:  Diagnosis Date  . Asthma   . Bipolar disorder ([place])   . Generalized anxiety disorder   . IBS (irritable bowel syndrome)   . Social anxiety disorder     Past Surgical History:  Procedure Laterality Date  . APPENDECTOMY    . BREAST LUMPECTOMY    . FOOT SURGERY      Prior to Admission medications   Medication Sig Start Date End Date Taking? Authorizing Provider  albuterol (PROVENTIL) (2.5 MG/3ML) 0.083% nebulizer solution Take 2.5 mg by nebulization every 6 (six) hours as needed for wheezing or shortness of breath.   Yes [provider]  Albuterol Sulfate (PROAIR RESPICLICK) 123XX123 (90 Base) MCG/ACT AEPB Inhale 1-2 puffs into the lungs every 6 (six) hours as needed (shortness of breath).   Yes [provider]  buPROPion (WELLBUTRIN XL) 300 MG 24 hr tablet Take 1 tablet (300 mg total) by mouth daily. 09/16/19  Yes Pucilowski, Olgierd A, MD  busPIRone (BUSPAR) 15 MG tablet Take 1 tablet (15 mg total) by mouth 2 (two) times daily. 09/16/19 12/15/19 Yes Pucilowski, Olgierd A, MD  ciclopirox (PENLAC) 8 % solution Apply topically at bedtime. Apply over nail and surrounding skin. Apply daily over previous coat. After seven (7) days, may remove with alcohol and continue cycle. 10/14/18  Yes Trula Slade, DPM  fluticasone (FLONASE) 50 MCG/ACT nasal spray Place 1 spray into  both nostrils daily. 02/28/19  Yes Collene Gobble, MD  lamoTRIgine (LAMICTAL) 200 MG tablet Take 1 tablet (200 mg total) by mouth at bedtime. 08/18/19 11/16/19 Yes Pucilowski, Olgierd A, MD  levocetirizine (XYZAL) 5 MG tablet TAKE 1 TABLET(5 MG) BY MOUTH EVERY EVENING 06/30/19  Yes Byrum, Rose Fillers, MD  LORazepam (ATIVAN) 0.5 MG tablet Take 1 tablet (0.5 mg total) by mouth 2 (two) times daily as needed for anxiety. 08/18/19 11/16/19 Yes Pucilowski, Olgierd A, MD  lurasidone (LATUDA) 20 MG TABS tablet Take 1 tablet (20 mg total) by mouth daily after supper. 09/16/19 12/15/19  Yes Pucilowski, Olgierd A, MD  montelukast (SINGULAIR) 10 MG tablet TAKE 1 TABLET(10 MG) BY MOUTH AT BEDTIME 05/05/19  Yes Byrum, Rose Fillers, MD  pantoprazole (PROTONIX) 40 MG tablet Take 1 tablet (40 mg total) by mouth 2 (two) times daily. 08/18/19  Yes Collene Gobble, MD  PREVIDENT 5000 BOOSTER PLUS 1.1 % PSTE  07/10/19  Yes [provider]  SYMBICORT 160-4.5 MCG/ACT inhaler INHALE 2 PUFF PO BID 02/27/19  Yes [provider]  traZODone (DESYREL) 50 MG tablet Take 1 tablet (50 mg total) by mouth at bedtime as needed for sleep. 09/16/19 12/15/19 Yes Pucilowski, Olgierd A, MD  ciprofloxacin (CIPRO) 500 MG tablet Take 1 tablet (500 mg total) by mouth 2 (two) times daily. One po bid x 7 days Patient not taking: Reported on 10/02/2019 09/21/19   Margarita Mail, PA-C  dicyclomine (BENTYL) 20 MG tablet Take 1 tablet (20 mg total) by mouth 2 (two) times daily. Patient not taking: Reported on 10/02/2019 09/21/19   Margarita Mail, PA-C  metroNIDAZOLE (FLAGYL) 500 MG tablet Take 1 tablet (500 mg total) by mouth 2 (two) times daily. One po bid x 7 days Patient not taking: Reported on 10/02/2019 09/21/19   Margarita Mail, PA-C    Family History  Problem Relation Age of Onset  . Asthma Father   . Diabetes Other   . Heart failure Other   . Anxiety disorder Maternal Grandmother   . Depression Maternal Grandmother   . Crohn's disease Sister        half sister  . Diverticulitis Paternal Grandmother   . Colon cancer Neg Hx   . Celiac disease Neg Hx      Social History   Tobacco Use  . Smoking status: Never Smoker  . Smokeless tobacco: Never Used  Substance Use Topics  . Alcohol use: Yes    Comment: socially   . Drug use: No    Allergies as of 10/02/2019 - Review Complete 09/24/2019  Allergen Reaction Noted  . Cefoxitin Itching and Rash 06/03/2016  . Amoxicillin Hives 10/06/2014  . Asa [aspirin] Hives 10/06/2014  . Sulfa antibiotics Hives 10/06/2014    Review of Systems:    All  systems reviewed and negative except where noted in HPI.   Observations/Objective:  Labs: CBC    Component Value Date/Time   WBC 12.7 (H) 09/21/2019 1614   RBC 4.36 09/21/2019 1614   HGB 13.1 09/21/2019 1614   HGB 13.0 09/14/2016 1419   HCT 38.6 09/21/2019 1614   HCT 40.4 09/14/2016 1419   PLT 323 09/21/2019 1614   MCV 88.5 09/21/2019 1614   MCV 86 09/14/2016 1419   MCH 30.0 09/21/2019 1614   MCHC 33.9 09/21/2019 1614   RDW 12.7 09/21/2019 1614   RDW 13.6 09/14/2016 1419   LYMPHSABS 3.2 01/11/2019 1816   LYMPHSABS 3.2 (H) 09/14/2016 1419   MONOABS 0.7 01/11/2019 1816  EOSABS 0.1 01/11/2019 1816   EOSABS 0.3 09/14/2016 1419   BASOSABS 0.0 01/11/2019 1816   BASOSABS 0.0 09/14/2016 1419   CMP     Component Value Date/Time   NA 136 09/21/2019 1614   NA 137 02/01/2017 0000   K 3.6 09/21/2019 1614   CL 104 09/21/2019 1614   CO2 24 09/21/2019 1614   GLUCOSE 105 (H) 09/21/2019 1614   BUN 13 09/21/2019 1614   BUN 10 02/01/2017 0000   CREATININE 0.79 09/21/2019 1614   CALCIUM 9.2 09/21/2019 1614   PROT 7.3 09/21/2019 1614   PROT 7.3 09/14/2016 1419   ALBUMIN 4.3 09/21/2019 1614   ALBUMIN 4.5 09/14/2016 1419   AST 16 09/21/2019 1614   ALT 17 09/21/2019 1614   ALKPHOS 56 09/21/2019 1614   BILITOT 0.6 09/21/2019 1614   BILITOT 0.3 09/14/2016 1419   GFRNONAA >60 09/21/2019 1614   GFRAA >60 09/21/2019 1614    Imaging Studies: CT ABDOMEN PELVIS W CONTRAST  Result Date: 09/21/2019 CLINICAL DATA:  Lower abdominal pain. EXAM: CT ABDOMEN AND PELVIS WITH CONTRAST TECHNIQUE: Multidetector CT imaging of the abdomen and pelvis was performed using the standard protocol following bolus administration of intravenous contrast. CONTRAST:  176mL OMNIPAQUE IOHEXOL 300 MG/ML  SOLN COMPARISON:  10/11/2016 FINDINGS: Lower chest: The lung bases are clear. The heart size is normal. Hepatobiliary: The liver is normal. Normal gallbladder.There is no biliary ductal dilation. Pancreas: Normal  contours without ductal dilatation. No peripancreatic fluid collection. Spleen: No splenic laceration or hematoma. Adrenals/Urinary Tract: --Adrenal glands: No adrenal hemorrhage. --Right kidney/ureter: There are punctate nonobstructing stones in the right kidney. There is no right-sided hydronephrosis. --Left kidney/ureter: No hydronephrosis or perinephric hematoma. --Urinary bladder: Bladder is decompressed and therefore poorly evaluated. Stomach/Bowel: --Stomach/Duodenum: No hiatal hernia or other gastric abnormality. Normal duodenal course and caliber. --Small bowel: No dilatation or inflammation. --Colon: The majority of the colon is underdistended which limits evaluation. There may be some mild colonic wall thickening. --Appendix: Surgically absent. Vascular/Lymphatic: Normal course and caliber of the major abdominal vessels. --No retroperitoneal lymphadenopathy. --No mesenteric lymphadenopathy. --No pelvic or inguinal lymphadenopathy. Reproductive: Unremarkable Other: No ascites or free air. The abdominal wall is normal. Musculoskeletal. No acute displaced fractures. IMPRESSION: 1. Questionable mild wall thickening of enlarged portion of the colon. This may be secondary to underdistention but can also be seen with an infectious or inflammatory colitis. 2. Nonobstructing right-sided nephrolithiasis. 3. Status post appendectomy. Electronically Signed   By: Constance Holster M.D.   On: 09/21/2019 18:06    Assessment and Plan:   Areyonna Fenning is a 29 y.o. y/o female has been referred for abdominal pain, diarrhea and blood in stool  Assessment and Plan: Symptoms most consistent with likely infectious causes Obtain GI profile to rule out infection  After above work-up is complete, can proceed with colonoscopy to rule out IBD.  However, acuity of symptoms and resolution with antibiotics most likely consistent with infectious causes, and therefore would be best to let the infectious process resolved  prior to colonoscopy to avoid complications during the procedure.  However, if symptoms persist or worsen anoscopy may be considered sooner.  Follow Up Instructions:   I discussed the assessment and treatment plan with the patient. The patient was provided an opportunity to ask questions and all were answered. The patient agreed with the plan and demonstrated an understanding of the instructions.   The patient was advised to call back or seek an in-person evaluation if the symptoms worsen or  if the condition fails to improve as anticipated.  I provided 13 minutes of non-face-to-face time during this encounter.   Virgel Manifold, MD  Speech recognition software was used to dictate the above note.

## 2019-10-08 ENCOUNTER — Ambulatory Visit (HOSPITAL_COMMUNITY): Payer: 59 | Admitting: Psychology

## 2019-10-08 ENCOUNTER — Encounter (HOSPITAL_COMMUNITY): Payer: Self-pay | Admitting: Psychology

## 2019-10-08 ENCOUNTER — Other Ambulatory Visit: Payer: Self-pay

## 2019-10-08 NOTE — Progress Notes (Signed)
Nicole Rodriguez is a 29 y.o. female patient who didn't show for her virtual appointment.  Counselor informed by email and reminded about cancellation policy and requested to inform of any changes in intent for counseling at this time.Marland Kitchen        Jan Fireman, [facility]

## 2019-10-09 ENCOUNTER — Other Ambulatory Visit: Payer: Self-pay

## 2019-10-09 ENCOUNTER — Ambulatory Visit (INDEPENDENT_AMBULATORY_CARE_PROVIDER_SITE_OTHER): Payer: 59 | Admitting: Adult Health

## 2019-10-09 ENCOUNTER — Telehealth: Payer: Self-pay

## 2019-10-09 ENCOUNTER — Telehealth: Payer: Self-pay | Admitting: Gastroenterology

## 2019-10-09 DIAGNOSIS — J301 Allergic rhinitis due to pollen: Secondary | ICD-10-CM | POA: Diagnosis not present

## 2019-10-09 DIAGNOSIS — J45909 Unspecified asthma, uncomplicated: Secondary | ICD-10-CM

## 2019-10-09 DIAGNOSIS — K219 Gastro-esophageal reflux disease without esophagitis: Secondary | ICD-10-CM | POA: Diagnosis not present

## 2019-10-09 LAB — GI PROFILE, STOOL, PCR

## 2019-10-09 MED ORDER — FLUTICASONE PROPIONATE 50 MCG/ACT NA SUSP: 1.0000 | Freq: Every day | NASAL | 5 refills | Status: DC

## 2019-10-09 MED ORDER — VANCOMYCIN HCL 125 MG PO CAPS: 125.0000 mg | ORAL_CAPSULE | Freq: Four times a day (QID) | ORAL | 0 refills | Status: AC

## 2019-10-09 MED ORDER — LEVOCETIRIZINE DIHYDROCHLORIDE 5 MG PO TABS: 5.0000 mg | ORAL_TABLET | Freq: Every evening | ORAL | 5 refills | Status: DC

## 2019-10-09 MED ORDER — MONTELUKAST SODIUM 10 MG PO TABS: 10.0000 mg | ORAL_TABLET | Freq: Every day | ORAL | 5 refills | Status: DC

## 2019-10-09 NOTE — Progress Notes (Signed)
Virtual Visit via Telephone Note  I connected with Nicole Rodriguez on 10/09/19 at  4:00 PM EDT by telephone and verified that I am speaking with the correct person using two identifiers.  Location: Patient: Home  Provider: Home    I discussed the limitations, risks, security and privacy concerns of performing an evaluation and management service by telephone and the availability of in person appointments. I also discussed with the patient that there may be a patient responsible charge related to this service. The patient expressed understanding and agreed to proceed.   History of Present Illness: 29 year old female never smoker followed for asthma and allergic rhinitis  Today's televisit is a 20-month follow-up for asthma and allergic rhinitis.  Patient says she has been doing very well since last visit.  She has no breathing issues.  She says she is trying to stay active.  She has had no flare of her cough or wheezing.  She remains on Singulair, Flonase, Xyzal daily.  She does have some underlying GERD.  Which she takes Protonix for which she says is under good control.  She denies any wheezing cough or chest pain.  She has rare use of her albuterol.  Activity tolerance has been good  Patient Active Problem List   Diagnosis Date Noted  . Rectal bleeding 09/24/2019  . Diarrhea 09/24/2019  . Abnormal CT scan, colon 09/24/2019  . GAD (generalized anxiety disorder) 07/19/2019  . Social anxiety disorder 07/19/2019  . Panic disorder 07/19/2019  . Bipolar 2 disorder ([place]) 07/19/2019  . Plantar fasciitis 10/15/2018  . Chest pain 10/10/2018  . Allergic rhinitis 08/19/2018  . GERD (gastroesophageal reflux disease) 08/19/2018  . Chronic cough 11/29/2017  . Asthma 11/29/2017  . Breast mass, right 03/18/2014  . Numbness and tingling in both hands 01/26/2014  . Neck pain 10/31/2010    Current Outpatient Medications on File Prior to Visit  Medication Sig Dispense Refill  . albuterol (PROVENTIL)  (2.5 MG/3ML) 0.083% nebulizer solution Take 2.5 mg by nebulization every 6 (six) hours as needed for wheezing or shortness of breath.    . Albuterol Sulfate (PROAIR RESPICLICK) 123XX123 (90 Base) MCG/ACT AEPB Inhale 1-2 puffs into the lungs every 6 (six) hours as needed (shortness of breath).    Marland Kitchen buPROPion (WELLBUTRIN XL) 300 MG 24 hr tablet Take 1 tablet (300 mg total) by mouth daily. 30 tablet 2  . busPIRone (BUSPAR) 15 MG tablet Take 1 tablet (15 mg total) by mouth 2 (two) times daily. 60 tablet 2  . ciclopirox (PENLAC) 8 % solution Apply topically at bedtime. Apply over nail and surrounding skin. Apply daily over previous coat. After seven (7) days, may remove with alcohol and continue cycle. 6.6 mL 4  . ciprofloxacin (CIPRO) 500 MG tablet Take 1 tablet (500 mg total) by mouth 2 (two) times daily. One po bid x 7 days (Patient not taking: Reported on 10/02/2019) 14 tablet 0  . dicyclomine (BENTYL) 20 MG tablet Take 1 tablet (20 mg total) by mouth 2 (two) times daily. (Patient not taking: Reported on 10/02/2019) 20 tablet 0  . fluticasone (FLONASE) 50 MCG/ACT nasal spray Place 1 spray into both nostrils daily. 16 g 11  . lamoTRIgine (LAMICTAL) 200 MG tablet Take 1 tablet (200 mg total) by mouth at bedtime. 30 tablet 2  . levocetirizine (XYZAL) 5 MG tablet TAKE 1 TABLET(5 MG) BY MOUTH EVERY EVENING 30 tablet 2  . LORazepam (ATIVAN) 0.5 MG tablet Take 1 tablet (0.5 mg total) by mouth 2 (  two) times daily as needed for anxiety. 60 tablet 2  . lurasidone (LATUDA) 20 MG TABS tablet Take 1 tablet (20 mg total) by mouth daily after supper. 30 tablet 2  . metroNIDAZOLE (FLAGYL) 500 MG tablet Take 1 tablet (500 mg total) by mouth 2 (two) times daily. One po bid x 7 days (Patient not taking: Reported on 10/02/2019) 14 tablet 0  . montelukast (SINGULAIR) 10 MG tablet TAKE 1 TABLET(10 MG) BY MOUTH AT BEDTIME 30 tablet 5  . pantoprazole (PROTONIX) 40 MG tablet Take 1 tablet (40 mg total) by mouth 2 (two) times daily. 60  tablet 0  . PREVIDENT 5000 BOOSTER PLUS 1.1 % PSTE     . SYMBICORT 160-4.5 MCG/ACT inhaler INHALE 2 PUFF PO BID    . traZODone (DESYREL) 50 MG tablet Take 1 tablet (50 mg total) by mouth at bedtime as needed for sleep. 30 tablet 2  . vancomycin (VANCOCIN) 125 MG capsule Take 1 capsule (125 mg total) by mouth 4 (four) times daily for 10 days. 40 capsule 0   Current Facility-Administered Medications on File Prior to Visit  Medication Dose Route Frequency Provider Last Rate Last Admin  . triamcinolone acetonide (KENALOG) 10 MG/ML injection 10 mg  10 mg Other Once Trula Slade, DPM         Observations/Objective:  Speaks in full sentences with no audible distress or wheezing  Assessment and Plan: Mild intermittent asthma under excellent control  Allergic rhinitis under excellent control  GERD under control   Plan  Patient Instructions  Continue on Xyzal, Singulair and Flonase daily Albuterol inhaler as needed for wheezing shortness of breath Activity as tolerated Continue on Protonix Continue on GERD diet Follow-up in 6 months with Dr. Lamonte Sakai and as needed      Follow Up Instructions:    I discussed the assessment and treatment plan with the patient. The patient was provided an opportunity to ask questions and all were answered. The patient agreed with the plan and demonstrated an understanding of the instructions.   The patient was advised to call back or seek an in-person evaluation if the symptoms worsen or if the condition fails to improve as anticipated.  I provided 21 minutes of non-face-to-face time during this encounter.   Rexene Edison, NP

## 2019-10-09 NOTE — Telephone Encounter (Signed)
-----   Message from Virgel Manifold, MD sent at 10/09/2019 10:28 AM EDT ----- Caryl Pina please let the patient know, her stool test showed infection with C. difficile.  This is the cause of her symptoms.  I have prescribed medication for 10 days to her pharmacy

## 2019-10-09 NOTE — Telephone Encounter (Signed)
Patient called stating she was returning call to [facility] for her results.

## 2019-10-09 NOTE — Telephone Encounter (Signed)
Patient verbalized understanding and will pick up the medications  

## 2019-10-09 NOTE — Patient Instructions (Signed)
Continue on Xyzal, Singulair and Flonase daily Albuterol inhaler as needed for wheezing shortness of breath Activity as tolerated Continue on Protonix Continue on GERD diet Follow-up in 6 months with Dr. Lamonte Sakai and as needed

## 2019-10-09 NOTE — Addendum Note (Signed)
Addended by: [name] on: 10/09/2019 10:28 AM   Modules accepted: Orders

## 2019-10-09 NOTE — Telephone Encounter (Signed)
Called and left a message for call back  

## 2019-10-09 NOTE — Telephone Encounter (Signed)
Documented in other telephone call

## 2019-10-13 ENCOUNTER — Other Ambulatory Visit: Payer: Self-pay | Admitting: Obstetrics and Gynecology

## 2019-10-13 ENCOUNTER — Ambulatory Visit (HOSPITAL_COMMUNITY): Payer: 59 | Admitting: Psychology

## 2019-10-13 ENCOUNTER — Encounter (HOSPITAL_COMMUNITY): Payer: Self-pay | Admitting: Psychology

## 2019-10-13 ENCOUNTER — Other Ambulatory Visit: Payer: Self-pay

## 2019-10-13 NOTE — Progress Notes (Signed)
Nicole Rodriguez is a 29 y.o. female patient who didn't show for appointment. Informed by email.  This is pt 3rd no show and asked pt to reach out to counselor before scheduling any further followup.  Marland Kitchen        Jan Fireman, [facility]

## 2019-10-16 NOTE — Telephone Encounter (Signed)
Discharge letter returned. Mailed letter via Leane Platt.

## 2019-10-23 ENCOUNTER — Other Ambulatory Visit: Payer: Self-pay

## 2019-10-23 ENCOUNTER — Ambulatory Visit (HOSPITAL_COMMUNITY): Payer: 59 | Admitting: Psychology

## 2019-10-28 ENCOUNTER — Ambulatory Visit (HOSPITAL_COMMUNITY): Payer: 59 | Admitting: Psychology

## 2019-10-29 ENCOUNTER — Other Ambulatory Visit: Payer: Self-pay | Admitting: Emergency Medicine

## 2019-10-29 MED ORDER — MONTELUKAST SODIUM 10 MG PO TABS: 10.0000 mg | ORAL_TABLET | Freq: Every day | ORAL | 5 refills | Status: DC

## 2019-11-04 ENCOUNTER — Encounter (HOSPITAL_COMMUNITY): Payer: Self-pay | Admitting: Psychology

## 2019-11-04 NOTE — Progress Notes (Signed)
Nicole Rodriguez is a 29 y.o. female patient pt d/c from counseling due to multiple no shows.  Pt is scheduled to continue w/ Dr. Montel Culver.        Jan Fireman, [facility]

## 2019-11-05 ENCOUNTER — Ambulatory Visit: Payer: 59 | Admitting: Gastroenterology

## 2019-11-05 ENCOUNTER — Telehealth: Payer: Self-pay | Admitting: Gastroenterology

## 2019-11-05 NOTE — Telephone Encounter (Signed)
Somehow she is on Dr. Dorothey Baseman schedule for today. We cancelled the appt and needs to f/u with Dr. Bonna Gains. Patient still having abd pain.

## 2019-11-05 NOTE — Progress Notes (Deleted)
Primary Care Physician: Neale Burly, MD  Primary Gastroenterologist:  Dr. Lucilla Lame  No chief complaint on file.   HPI: Nicole Rodriguez is a 29 y.o. female here ***  Past Medical History:  Diagnosis Date  . Asthma   . Bipolar disorder ([place])   . Generalized anxiety disorder   . IBS (irritable bowel syndrome)   . Social anxiety disorder     Current Outpatient Medications  Medication Sig Dispense Refill  . albuterol (PROVENTIL) (2.5 MG/3ML) 0.083% nebulizer solution Take 2.5 mg by nebulization every 6 (six) hours as needed for wheezing or shortness of breath.    . Albuterol Sulfate (PROAIR RESPICLICK) 123XX123 (90 Base) MCG/ACT AEPB Inhale 1-2 puffs into the lungs every 6 (six) hours as needed (shortness of breath).    Marland Kitchen buPROPion (WELLBUTRIN XL) 300 MG 24 hr tablet TAKE 1 TABLET(300 MG) BY MOUTH DAILY 30 tablet 2  . busPIRone (BUSPAR) 15 MG tablet Take 1 tablet (15 mg total) by mouth 2 (two) times daily. 60 tablet 2  . ciclopirox (PENLAC) 8 % solution Apply topically at bedtime. Apply over nail and surrounding skin. Apply daily over previous coat. After seven (7) days, may remove with alcohol and continue cycle. 6.6 mL 4  . ciprofloxacin (CIPRO) 500 MG tablet Take 1 tablet (500 mg total) by mouth 2 (two) times daily. One po bid x 7 days (Patient not taking: Reported on 10/02/2019) 14 tablet 0  . dicyclomine (BENTYL) 20 MG tablet Take 1 tablet (20 mg total) by mouth 2 (two) times daily. (Patient not taking: Reported on 10/02/2019) 20 tablet 0  . fluticasone (FLONASE) 50 MCG/ACT nasal spray Place 1 spray into both nostrils daily. 16 g 5  . lamoTRIgine (LAMICTAL) 200 MG tablet Take 1 tablet (200 mg total) by mouth at bedtime. 30 tablet 2  . levocetirizine (XYZAL) 5 MG tablet Take 1 tablet (5 mg total) by mouth every evening. 30 tablet 5  . LORazepam (ATIVAN) 0.5 MG tablet Take 1 tablet (0.5 mg total) by mouth 2 (two) times daily as needed for anxiety. 60 tablet 2  . lurasidone (LATUDA)  20 MG TABS tablet Take 1 tablet (20 mg total) by mouth daily after supper. 30 tablet 2  . metroNIDAZOLE (FLAGYL) 500 MG tablet Take 1 tablet (500 mg total) by mouth 2 (two) times daily. One po bid x 7 days (Patient not taking: Reported on 10/02/2019) 14 tablet 0  . montelukast (SINGULAIR) 10 MG tablet Take 1 tablet (10 mg total) by mouth at bedtime. 30 tablet 5  . pantoprazole (PROTONIX) 40 MG tablet Take 1 tablet (40 mg total) by mouth 2 (two) times daily. 60 tablet 0  . PREVIDENT 5000 BOOSTER PLUS 1.1 % PSTE     . SYMBICORT 160-4.5 MCG/ACT inhaler INHALE 2 PUFF PO BID    . traZODone (DESYREL) 50 MG tablet Take 1 tablet (50 mg total) by mouth at bedtime as needed for sleep. 30 tablet 2   Current Facility-Administered Medications  Medication Dose Route Frequency Provider Last Rate Last Admin  . triamcinolone acetonide (KENALOG) 10 MG/ML injection 10 mg  10 mg Other Once Trula Slade, DPM        Allergies as of 11/05/2019 - Review Complete 09/24/2019  Allergen Reaction Noted  . Cefoxitin Itching and Rash 06/03/2016  . Amoxicillin Hives 10/06/2014  . Asa [aspirin] Hives 10/06/2014  . Sulfa antibiotics Hives 10/06/2014    ROS:  General: Negative for anorexia, weight loss, fever, chills, fatigue,  weakness. ENT: Negative for hoarseness, difficulty swallowing , nasal congestion. CV: Negative for chest pain, angina, palpitations, dyspnea on exertion, peripheral edema.  Respiratory: Negative for dyspnea at rest, dyspnea on exertion, cough, sputum, wheezing.  GI: See history of present illness. GU:  Negative for dysuria, hematuria, urinary incontinence, urinary frequency, nocturnal urination.  Endo: Negative for unusual weight change.    Physical Examination:   There were no vitals taken for this visit.  General: Well-nourished, well-developed in no acute distress.  Eyes: No icterus. Conjunctivae pink. Lungs: Clear to auscultation bilaterally. Non-labored. Heart: Regular rate and  rhythm, no murmurs rubs or gallops.  Abdomen: Bowel sounds are normal, nontender, nondistended, no hepatosplenomegaly or masses, no abdominal bruits or hernia , no rebound or guarding.   Extremities: No lower extremity edema. No clubbing or deformities. Neuro: Alert and oriented x 3.  Grossly intact. Skin: Warm and dry, no jaundice.   Psych: Alert and cooperative, normal mood and affect.  Labs:  ***  Imaging Studies: No results found.  Assessment and Plan:   Nicole Rodriguez is a 29 y.o. y/o female ***     Lucilla Lame, MD. Marval Regal    Note: This dictation was prepared with Dragon dictation along with smaller phrase technology. Any transcriptional errors that result from this process are unintentional.

## 2019-11-05 NOTE — Telephone Encounter (Signed)
Can you please schedule patient an appointment to see Dr. Bonna Gains first available. Thank you!

## 2019-11-05 NOTE — Telephone Encounter (Signed)
Made appointment for patient

## 2019-11-10 ENCOUNTER — Other Ambulatory Visit: Payer: Self-pay

## 2019-11-10 ENCOUNTER — Telehealth (INDEPENDENT_AMBULATORY_CARE_PROVIDER_SITE_OTHER): Payer: 59 | Admitting: Psychiatry

## 2019-11-10 DIAGNOSIS — F3181 Bipolar II disorder: Secondary | ICD-10-CM | POA: Diagnosis not present

## 2019-11-10 DIAGNOSIS — F4010 Social phobia, unspecified: Secondary | ICD-10-CM | POA: Diagnosis not present

## 2019-11-10 DIAGNOSIS — F411 Generalized anxiety disorder: Secondary | ICD-10-CM

## 2019-11-10 DIAGNOSIS — F410 Panic disorder [episodic paroxysmal anxiety] without agoraphobia: Secondary | ICD-10-CM

## 2019-11-10 MED ORDER — BUSPIRONE HCL 15 MG PO TABS: 15.0000 mg | ORAL_TABLET | Freq: Two times a day (BID) | ORAL | 2 refills | Status: DC

## 2019-11-10 MED ORDER — TRAZODONE HCL 50 MG PO TABS: 50.0000 mg | ORAL_TABLET | Freq: Every evening | ORAL | 2 refills | Status: DC | PRN

## 2019-11-10 MED ORDER — LORAZEPAM 0.5 MG PO TABS: 0.5000 mg | ORAL_TABLET | Freq: Two times a day (BID) | ORAL | 2 refills | Status: DC | PRN

## 2019-11-10 MED ORDER — LAMOTRIGINE 200 MG PO TABS: 200.0000 mg | ORAL_TABLET | Freq: Every day | ORAL | 2 refills | Status: DC

## 2019-11-10 MED ORDER — BUPROPION HCL ER (XL) 300 MG PO TB24: 300.0000 mg | ORAL_TABLET | Freq: Every day | ORAL | 2 refills | Status: DC
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
## 2019-11-11 ENCOUNTER — Other Ambulatory Visit (HOSPITAL_COMMUNITY): Payer: Self-pay | Admitting: Psychiatry

## 2019-11-25 ENCOUNTER — Encounter: Payer: Self-pay | Admitting: Gastroenterology

## 2019-11-25 ENCOUNTER — Other Ambulatory Visit: Payer: Self-pay

## 2019-11-25 ENCOUNTER — Ambulatory Visit (INDEPENDENT_AMBULATORY_CARE_PROVIDER_SITE_OTHER): Payer: 59 | Admitting: Gastroenterology

## 2019-11-25 VITALS — BP 113/76 | HR 102 | Temp 97.1°F | Wt 172.0 lb

## 2019-11-25 DIAGNOSIS — R109 Unspecified abdominal pain: Secondary | ICD-10-CM

## 2019-11-25 DIAGNOSIS — R197 Diarrhea, unspecified: Secondary | ICD-10-CM

## 2019-11-25 NOTE — Patient Instructions (Signed)
Please take Protonix 20 MG twice a day for the next 2-3 weeks to help you with your pain.

## 2019-11-25 NOTE — Progress Notes (Signed)
Nicole Antigua, MD [address]  [place]  [place], [place] [address]  Main: [phone]  Fax: [phone]   Primary Care Physician: Neale Burly, MD   Chief Complaint  Patient presents with  . Abdominal Pain    Patient stated that she started having abdominal pain since yesterday in the AM and nausea. Patient denied vomiting, diarrhea and constipation.    HPI: Nicole Rodriguez is a 29 y.o. female previous history of abdominal pain and diarrhea and found to be C. difficile positive and has undergone treatment.  Since then her diarrhea had resolved.  However, yesterday she started having abdominal pain again with one episode of loose stool.  The loose stools have not reoccurred since then.  She reports taking Aleve about twice a day.  Left mid quadrant abdominal pain with no nausea or vomiting.  No blood in stool.  No prior EGD or colonoscopy.  CT scan in March 2021 with questionable mild wall thickening.  Current Outpatient Medications  Medication Sig Dispense Refill  . albuterol (PROVENTIL) (2.5 MG/3ML) 0.083% nebulizer solution Take 2.5 mg by nebulization every 6 (six) hours as needed for wheezing or shortness of breath.    . Albuterol Sulfate (PROAIR RESPICLICK) 123XX123 (90 Base) MCG/ACT AEPB Inhale 1-2 puffs into the lungs every 6 (six) hours as needed (shortness of breath).    Marland Kitchen buPROPion (WELLBUTRIN XL) 300 MG 24 hr tablet Take 1 tablet (300 mg total) by mouth daily. 30 tablet 2  . busPIRone (BUSPAR) 15 MG tablet Take 1 tablet (15 mg total) by mouth 2 (two) times daily. 60 tablet 2  . ciclopirox (PENLAC) 8 % solution Apply topically at bedtime. Apply over nail and surrounding skin. Apply daily over previous coat. After seven (7) days, may remove with alcohol and continue cycle. 6.6 mL 4  . dicyclomine (BENTYL) 20 MG tablet Take 1 tablet (20 mg total) by mouth 2 (two) times daily. 20 tablet 0  . fluticasone (FLONASE) 50 MCG/ACT nasal spray Place 1 spray into both  nostrils daily. 16 g 5  . lamoTRIgine (LAMICTAL) 200 MG tablet TAKE 1 TABLET(200 MG) BY MOUTH AT BEDTIME 30 tablet 2  . levocetirizine (XYZAL) 5 MG tablet Take 1 tablet (5 mg total) by mouth every evening. 30 tablet 5  . LORazepam (ATIVAN) 0.5 MG tablet Take 1 tablet (0.5 mg total) by mouth 2 (two) times daily as needed for anxiety. 60 tablet 2  . lurasidone (LATUDA) 20 MG TABS tablet Take 1 tablet (20 mg total) by mouth daily after supper. 30 tablet 2  . montelukast (SINGULAIR) 10 MG tablet Take 1 tablet (10 mg total) by mouth at bedtime. 30 tablet 5  . pantoprazole (PROTONIX) 40 MG tablet Take 1 tablet (40 mg total) by mouth 2 (two) times daily. 60 tablet 0  . PREVIDENT 5000 BOOSTER PLUS 1.1 % PSTE     . SYMBICORT 160-4.5 MCG/ACT inhaler INHALE 2 PUFF PO BID    . traZODone (DESYREL) 50 MG tablet Take 1 tablet (50 mg total) by mouth at bedtime as needed for sleep. 30 tablet 2   Current Facility-Administered Medications  Medication Dose Route Frequency Provider Last Rate Last Admin  . triamcinolone acetonide (KENALOG) 10 MG/ML injection 10 mg  10 mg Other Once Trula Slade, DPM        Allergies as of 11/25/2019 - Review Complete 11/25/2019  Allergen Reaction Noted  . Cefoxitin Itching and Rash 06/03/2016  . Amoxicillin Hives 10/06/2014  . Diona Fanti [  aspirin] Hives 10/06/2014  . Sulfa antibiotics Hives 10/06/2014    ROS:  General: Negative for anorexia, weight loss, fever, chills, fatigue, weakness. ENT: Negative for hoarseness, difficulty swallowing , nasal congestion. CV: Negative for chest pain, angina, palpitations, dyspnea on exertion, peripheral edema.  Respiratory: Negative for dyspnea at rest, dyspnea on exertion, cough, sputum, wheezing.  GI: See history of present illness. GU:  Negative for dysuria, hematuria, urinary incontinence, urinary frequency, nocturnal urination.  Endo: Negative for unusual weight change.    Physical Examination:   BP 113/76   Pulse (!) 102    Temp (!) 97.1 F (36.2 C) (Oral)   Wt 172 lb (78 kg)   BMI 31.46 kg/m   General: Well-nourished, well-developed in no acute distress.  Eyes: No icterus. Conjunctivae pink. Mouth: Oropharyngeal mucosa moist and pink , no lesions erythema or exudate. Neck: Supple, Trachea midline Abdomen: Bowel sounds are normal, nontender, nondistended, no hepatosplenomegaly or masses, no abdominal bruits or hernia , no rebound or guarding.   Extremities: No lower extremity edema. No clubbing or deformities. Neuro: Alert and oriented x 3.  Grossly intact. Skin: Warm and dry, no jaundice.   Psych: Alert and cooperative, normal mood and affect.   Labs: CMP     Component Value Date/Time   NA 136 09/21/2019 1614   NA 137 02/01/2017 0000   K 3.6 09/21/2019 1614   CL 104 09/21/2019 1614   CO2 24 09/21/2019 1614   GLUCOSE 105 (H) 09/21/2019 1614   BUN 13 09/21/2019 1614   BUN 10 02/01/2017 0000   CREATININE 0.79 09/21/2019 1614   CALCIUM 9.2 09/21/2019 1614   PROT 7.3 09/21/2019 1614   PROT 7.3 09/14/2016 1419   ALBUMIN 4.3 09/21/2019 1614   ALBUMIN 4.5 09/14/2016 1419   AST 16 09/21/2019 1614   ALT 17 09/21/2019 1614   ALKPHOS 56 09/21/2019 1614   BILITOT 0.6 09/21/2019 1614   BILITOT 0.3 09/14/2016 1419   GFRNONAA >60 09/21/2019 1614   GFRAA >60 09/21/2019 1614   Lab Results  Component Value Date   WBC 12.7 (H) 09/21/2019   HGB 13.1 09/21/2019   HCT 38.6 09/21/2019   MCV 88.5 09/21/2019   PLT 323 09/21/2019    Imaging Studies: No results found.  Assessment and Plan:   Nicole Rodriguez is a 29 y.o. y/o female with abdominal pain, previous history of C. difficile, treated with vancomycin  Previous C. difficile symptoms have completely resolved  However, her pain has reoccurred and she would like further evaluation.  We did discuss that given her Aleve use, she may have some underlying gastritis and we could try increasing the PPI dose first and wait for symptom response before  proceeding with endoscopic procedures.  However, due to recurrent symptoms patient is interested in proceeding with EGD and colonoscopy after discussing risks and benefits in detail.  Since CT reported questionable colon thickening, this would also allow to rule out IBD  I have discussed alternative options, risks & benefits,  which include, but are not limited to, bleeding, infection, perforation,respiratory complication & drug reaction.  The patient agrees with this plan & written consent will be obtained.    (Risks of PPI use were discussed with patient including bone loss, C. Diff diarrhea, pneumonia, infections, CKD, electrolyte abnormalities.  Pt. Verbalizes understanding and chooses to continue the medication.)  Can also increase PPI dose to Protonix 40 mg twice daily and discontinue Aleve in the meantime  If loose stools recur patient advised to  inform us   Dr Nicole Rodriguez

## 2019-11-28 ENCOUNTER — Other Ambulatory Visit: Payer: Self-pay

## 2019-11-28 ENCOUNTER — Other Ambulatory Visit
Admission: RE | Admit: 2019-11-28 | Discharge: 2019-11-28 | Disposition: A | Discharge status: Home / Self Care | Payer: 59 | Source: Ambulatory Visit | Attending: Gastroenterology | Admitting: Gastroenterology

## 2019-11-28 DIAGNOSIS — Z20822 Contact with and (suspected) exposure to covid-19: Secondary | ICD-10-CM | POA: Diagnosis not present

## 2019-11-28 DIAGNOSIS — Z01812 Encounter for preprocedural laboratory examination: Secondary | ICD-10-CM | POA: Insufficient documentation

## 2019-11-28 MED ORDER — PANTOPRAZOLE SODIUM 40 MG PO TBEC: 40.0000 mg | DELAYED_RELEASE_TABLET | Freq: Two times a day (BID) | ORAL | 5 refills | Status: DC

## 2019-11-29 LAB — SARS CORONAVIRUS 2 (TAT 6-24 HRS): SARS Coronavirus 2: NEGATIVE

## 2019-12-03 ENCOUNTER — Other Ambulatory Visit: Payer: Self-pay

## 2019-12-03 ENCOUNTER — Ambulatory Visit: Payer: 59 | Admitting: Certified Registered Nurse Anesthetist

## 2019-12-03 ENCOUNTER — Encounter
Admission: RE | Disposition: A | Discharge status: Home / Self Care | Payer: Self-pay | Source: Home / Self Care | Attending: Gastroenterology

## 2019-12-03 ENCOUNTER — Ambulatory Visit
Admission: RE | Admit: 2019-12-03 | Discharge: 2019-12-03 | Disposition: A | Discharge status: Home / Self Care | Payer: 59 | Attending: Gastroenterology | Admitting: Gastroenterology

## 2019-12-03 ENCOUNTER — Encounter: Payer: Self-pay | Admitting: Gastroenterology

## 2019-12-03 DIAGNOSIS — R197 Diarrhea, unspecified: Secondary | ICD-10-CM

## 2019-12-03 DIAGNOSIS — R109 Unspecified abdominal pain: Secondary | ICD-10-CM

## 2019-12-03 DIAGNOSIS — J45909 Unspecified asthma, uncomplicated: Secondary | ICD-10-CM | POA: Diagnosis not present

## 2019-12-03 DIAGNOSIS — F319 Bipolar disorder, unspecified: Secondary | ICD-10-CM | POA: Insufficient documentation

## 2019-12-03 DIAGNOSIS — R1013 Epigastric pain: Secondary | ICD-10-CM | POA: Insufficient documentation

## 2019-12-03 DIAGNOSIS — K589 Irritable bowel syndrome without diarrhea: Secondary | ICD-10-CM | POA: Diagnosis not present

## 2019-12-03 DIAGNOSIS — Z881 Allergy status to other antibiotic agents status: Secondary | ICD-10-CM | POA: Insufficient documentation

## 2019-12-03 DIAGNOSIS — K317 Polyp of stomach and duodenum: Secondary | ICD-10-CM | POA: Diagnosis not present

## 2019-12-03 DIAGNOSIS — Z882 Allergy status to sulfonamides status: Secondary | ICD-10-CM | POA: Diagnosis not present

## 2019-12-03 DIAGNOSIS — F411 Generalized anxiety disorder: Secondary | ICD-10-CM | POA: Insufficient documentation

## 2019-12-03 DIAGNOSIS — Z79899 Other long term (current) drug therapy: Secondary | ICD-10-CM | POA: Diagnosis not present

## 2019-12-03 DIAGNOSIS — Z886 Allergy status to analgesic agent status: Secondary | ICD-10-CM | POA: Diagnosis not present

## 2019-12-03 DIAGNOSIS — D120 Benign neoplasm of cecum: Secondary | ICD-10-CM | POA: Diagnosis not present

## 2019-12-03 DIAGNOSIS — R933 Abnormal findings on diagnostic imaging of other parts of digestive tract: Secondary | ICD-10-CM | POA: Diagnosis not present

## 2019-12-03 DIAGNOSIS — Z880 Allergy status to penicillin: Secondary | ICD-10-CM | POA: Diagnosis not present

## 2019-12-03 HISTORY — PX: COLONOSCOPY WITH PROPOFOL: SHX5780

## 2019-12-03 HISTORY — PX: ESOPHAGOGASTRODUODENOSCOPY (EGD) WITH PROPOFOL: SHX5813

## 2019-12-03 LAB — POCT PREGNANCY, URINE: Preg Test, Ur: NEGATIVE

## 2019-12-03 SURGERY — COLONOSCOPY WITH PROPOFOL
Anesthesia: General

## 2019-12-03 MED ORDER — LIDOCAINE HCL (PF) 2 % IJ SOLN
INTRAMUSCULAR | Status: AC
  Filled 2019-12-03: qty 30

## 2019-12-03 MED ORDER — DEXMEDETOMIDINE HCL IN NACL 80 MCG/20ML IV SOLN
INTRAVENOUS | Status: AC
  Filled 2019-12-03: qty 20

## 2019-12-03 MED ORDER — PHENYLEPHRINE HCL (PRESSORS) 10 MG/ML IV SOLN
INTRAVENOUS | Status: AC
  Filled 2019-12-03: qty 1

## 2019-12-03 MED ORDER — GLYCOPYRROLATE 0.2 MG/ML IJ SOLN
INTRAMUSCULAR | Status: DC | PRN
  Administered 2019-12-03: .1 mg via INTRAVENOUS

## 2019-12-03 MED ORDER — GLYCOPYRROLATE 0.2 MG/ML IJ SOLN
INTRAMUSCULAR | Status: AC
  Filled 2019-12-03: qty 1

## 2019-12-03 MED ORDER — PROPOFOL 500 MG/50ML IV EMUL
INTRAVENOUS | Status: DC | PRN
  Administered 2019-12-03: 125 ug/kg/min via INTRAVENOUS

## 2019-12-03 MED ORDER — SODIUM CHLORIDE 0.9 % IV SOLN
INTRAVENOUS | Status: DC
  Administered 2019-12-03: 1000 mL via INTRAVENOUS

## 2019-12-03 MED ORDER — MIDAZOLAM HCL 2 MG/2ML IJ SOLN
INTRAMUSCULAR | Status: DC | PRN
  Administered 2019-12-03: 2 mg via INTRAVENOUS

## 2019-12-03 MED ORDER — LIDOCAINE HCL (CARDIAC) PF 100 MG/5ML IV SOSY
PREFILLED_SYRINGE | INTRAVENOUS | Status: DC | PRN
  Administered 2019-12-03: 100 mg via INTRAVENOUS

## 2019-12-03 MED ORDER — PROPOFOL 500 MG/50ML IV EMUL
INTRAVENOUS | Status: AC
  Filled 2019-12-03: qty 150

## 2019-12-03 MED ORDER — MIDAZOLAM HCL 2 MG/2ML IJ SOLN
INTRAMUSCULAR | Status: AC
  Filled 2019-12-03: qty 2

## 2019-12-03 MED ORDER — PROPOFOL 10 MG/ML IV BOLUS
INTRAVENOUS | Status: DC | PRN
  Administered 2019-12-03: 20 mg via INTRAVENOUS
  Administered 2019-12-03: 10 mg via INTRAVENOUS
  Administered 2019-12-03: 50 mg via INTRAVENOUS
  Administered 2019-12-03: 40 mg via INTRAVENOUS
  Administered 2019-12-03: 20 mg via INTRAVENOUS
  Administered 2019-12-03: 60 mg via INTRAVENOUS

## 2019-12-03 MED ORDER — PROPOFOL 10 MG/ML IV BOLUS
INTRAVENOUS | Status: AC
  Filled 2019-12-03: qty 20

## 2019-12-03 NOTE — Transfer of Care (Addendum)
Immediate Anesthesia Transfer of Care Note  Patient: Nicole Rodriguez  Procedure(s) Performed: COLONOSCOPY WITH PROPOFOL (N/A ) ESOPHAGOGASTRODUODENOSCOPY (EGD) WITH PROPOFOL (N/A )  Patient Location: PACU  Anesthesia Type:General  Level of Consciousness: drowsy and patient cooperative  Airway & Oxygen Therapy: Patient Spontanous Breathing and Patient connected to nasal cannula oxygen  Post-op Assessment: Report given to RN and Post -op Vital signs reviewed and stable  Post vital signs: Reviewed and stable  Last Vitals:  Vitals Value Taken Time  BP 100/59 12/03/19 0933  Temp    Pulse 91 12/03/19 0934  Resp 19 12/03/19 0934  SpO2 100 % 12/03/19 0934  Vitals shown include unvalidated device data.  Last Pain:  Vitals:   12/03/19 0807  TempSrc: Temporal  PainSc: 0-No pain         Complications: No apparent anesthesia complications

## 2019-12-03 NOTE — H&P (Signed)
Nicole Antigua, MD [address] [place], [place], [place], [address] [address], [place], [place], [place], [address] Phone: [phone]  Fax: [phone]  Primary Care Physician:  Neale Burly, MD   Pre-Procedure History & Physical: HPI:  Nicole Rodriguez is a 29 y.o. female is here for a colonoscopy and EGD.   Past Medical History:  Diagnosis Date  . Asthma   . Bipolar disorder ([place])   . Generalized anxiety disorder   . IBS (irritable bowel syndrome)   . Social anxiety disorder     Past Surgical History:  Procedure Laterality Date  . APPENDECTOMY    . BREAST LUMPECTOMY    . FOOT SURGERY      Prior to Admission medications   Medication Sig Start Date End Date Taking? Authorizing Provider  albuterol (PROVENTIL) (2.5 MG/3ML) 0.083% nebulizer solution Take 2.5 mg by nebulization every 6 (six) hours as needed for wheezing or shortness of breath.   Yes [provider]  Albuterol Sulfate (PROAIR RESPICLICK) [id number] (90 Base) MCG/ACT AEPB Inhale 1-2 puffs into the lungs every 6 (six) hours as needed (shortness of breath).   Yes [provider]  buPROPion (WELLBUTRIN XL) 300 MG 24 hr tablet Take 1 tablet (300 mg total) by mouth daily. 11/10/19 02/08/20 Yes Pucilowski, Olgierd A, MD  busPIRone (BUSPAR) 15 MG tablet Take 1 tablet (15 mg total) by mouth 2 (two) times daily. 11/10/19 02/08/20 Yes Pucilowski, Olgierd A, MD  ciclopirox (PENLAC) 8 % solution Apply topically at bedtime. Apply over nail and surrounding skin. Apply daily over previous coat. After seven (7) days, may remove with alcohol and continue cycle. 10/14/18  Yes Trula Slade, DPM  dicyclomine (BENTYL) 20 MG tablet Take 1 tablet (20 mg total) by mouth 2 (two) times daily. 09/21/19  Yes Harris, Abigail, PA-C  fluticasone (FLONASE) 50 MCG/ACT nasal spray Place 1 spray into both nostrils daily. 10/09/19  Yes Parrett, Tammy S, NP  lamoTRIgine (LAMICTAL) 200 MG tablet TAKE 1 TABLET(200 MG) BY MOUTH AT  BEDTIME 11/11/19  Yes Pucilowski, Olgierd A, MD  levocetirizine (XYZAL) 5 MG tablet Take 1 tablet (5 mg total) by mouth every evening. 10/09/19  Yes Parrett, Tammy S, NP  LORazepam (ATIVAN) 0.5 MG tablet Take 1 tablet (0.5 mg total) by mouth 2 (two) times daily as needed for anxiety. 11/10/19 02/08/20 Yes Pucilowski, Olgierd A, MD  lurasidone (LATUDA) 20 MG TABS tablet Take 1 tablet (20 mg total) by mouth daily after supper. 09/16/19 12/15/19 Yes Pucilowski, Olgierd A, MD  montelukast (SINGULAIR) 10 MG tablet Take 1 tablet (10 mg total) by mouth at bedtime. 10/29/19  Yes Collene Gobble, MD  pantoprazole (PROTONIX) 40 MG tablet Take 1 tablet (40 mg total) by mouth 2 (two) times daily. 11/28/19  Yes Collene Gobble, MD  PREVIDENT 5000 BOOSTER PLUS 1.1 % PSTE  07/10/19  Yes [provider]  SYMBICORT 160-4.5 MCG/ACT inhaler INHALE 2 PUFF PO BID 02/27/19  Yes [provider]  traZODone (DESYREL) 50 MG tablet Take 1 tablet (50 mg total) by mouth at bedtime as needed for sleep. 11/10/19 02/08/20 Yes Pucilowski, Marchia Bond, MD    Allergies as of 11/26/2019 - Review Complete 11/25/2019  Allergen Reaction Noted  . Cefoxitin Itching and Rash 06/03/2016  . Amoxicillin Hives 10/06/2014  . Asa [aspirin] Hives 10/06/2014  . Sulfa antibiotics Hives 10/06/2014    Family History  Problem Relation Age of Onset  . Asthma Father   . Diabetes Other   .  Heart failure Other   . Anxiety disorder Maternal Grandmother   . Depression Maternal Grandmother   . Crohn's disease Sister        half sister  . Diverticulitis Paternal Grandmother   . Colon cancer Neg Hx   . Celiac disease Neg Hx     Social History   Socioeconomic History  . Marital status: Married    Spouse name: Not on file  . Number of children: 1  . Years of education: Not on file  . Highest education level: Not on file  Occupational History  . Not on file  Tobacco Use  . Smoking status: Never Smoker  . Smokeless tobacco: Never Used    Substance and Sexual Activity  . Alcohol use: Yes    Comment: socially   . Drug use: No  . Sexual activity: Yes    Birth control/protection: None  Other Topics Concern  . Not on file  Social History Narrative   Daughter 69 years old.   Social Determinants of Health   Financial Resource Strain:   . Difficulty of Paying Living Expenses:   Food Insecurity:   . Worried About Charity fundraiser in the Last Year:   . Arboriculturist in the Last Year:   Transportation Needs:   . Film/video editor (Medical):   Marland Kitchen Lack of Transportation (Non-Medical):   Physical Activity:   . Days of Exercise per Week:   . Minutes of Exercise per Session:   Stress:   . Feeling of Stress :   Social Connections:   . Frequency of Communication with Friends and Family:   . Frequency of Social Gatherings with Friends and Family:   . Attends Religious Services:   . Active Member of Clubs or Organizations:   . Attends Archivist Meetings:   Marland Kitchen Marital Status:   Intimate Partner Violence:   . Fear of Current or Ex-Partner:   . Emotionally Abused:   Marland Kitchen Physically Abused:   . Sexually Abused:     Review of Systems: See HPI, otherwise negative ROS  Physical Exam: BP (!) 136/105   Pulse 96   Temp 97.9 F (36.6 C) (Temporal)   Resp 20   Ht 5\' 2"  (1.575 m)   Wt 75.8 kg   SpO2 100%   BMI 30.54 kg/m  General:   Alert,  pleasant and cooperative in NAD Head:  Normocephalic and atraumatic. Neck:  Supple; no masses or thyromegaly. Lungs:  Clear throughout to auscultation, normal respiratory effort.    Heart:  +S1, +S2, Regular rate and rhythm, No edema. Abdomen:  Soft, nontender and nondistended. Normal bowel sounds, without guarding, and without rebound.   Neurologic:  Alert and  oriented x4;  grossly normal neurologically.  Impression/Plan: [name] is here for a colonoscopy to be performed for  and EGD for abdominal pain, colon thickening on CT  Risks, benefits,  limitations, and alternatives regarding the procedures have been reviewed with the patient.  Questions have been answered.  All parties agreeable.   Virgel Manifold, MD  12/03/2019, 8:46 AM

## 2019-12-03 NOTE — Anesthesia Procedure Notes (Signed)
Performed by: Hedda Slade, CRNA Oxygen Delivery Method: Nasal cannula

## 2019-12-03 NOTE — Op Note (Signed)
[facility] Gastroenterology Patient Name: Nicole Rodriguez Procedure Date: 12/03/2019 8:21 AM MRN: [id number] Account #: [id number] Date of Birth: 01-Apr-1991 Admit Type: Outpatient Age: 29 Room: [facility] ENDO ROOM 3 Gender: Female Note Status: Finalized Procedure:             Upper GI endoscopy Indications:           Epigastric abdominal pain Providers:             Chau Savell B. Bonna Gains MD, MD Referring MD:          Stoney Bang MD, MD (Referring MD) Medicines:             Monitored Anesthesia Care Complications:         No immediate complications. Procedure:             Pre-Anesthesia Assessment:                        - Prior to the procedure, a History and Physical was                         performed, and patient medications, allergies and                         sensitivities were reviewed. The patient's tolerance                         of previous anesthesia was reviewed.                        - The risks and benefits of the procedure and the                         sedation options and risks were discussed with the                         patient. All questions were answered and informed                         consent was obtained.                        - Patient identification and proposed procedure were                         verified prior to the procedure by the physician, the                         nurse, the anesthesiologist, the anesthetist and the                         technician. The procedure was verified in the                         procedure room.                        - ASA Grade Assessment: II - A patient with mild                         systemic  disease.                        After obtaining informed consent, the endoscope was                         passed under direct vision. Throughout the procedure,                         the patient's blood pressure, pulse, and oxygen                         saturations were monitored  continuously. The Endoscope                         was introduced through the mouth, and advanced to the                         second part of duodenum. The upper GI endoscopy was                         accomplished with ease. The patient tolerated the                         procedure well. Findings:      The examined esophagus was normal.      The entire examined stomach was normal. Biopsies were obtained in the       gastric body, at the incisura and in the gastric antrum with cold       forceps for histology. Biopsies were taken with a cold forceps for       Helicobacter pylori testing.      A few 3 to 5 mm sessile polyps with no bleeding and no stigmata of       recent bleeding were found in the gastric body. Biopsies were taken with       a cold forceps for histology.      The duodenal bulb, second portion of the duodenum and examined duodenum       were normal. Impression:            - Normal esophagus.                        - Normal stomach. Biopsied.                        - A few gastric polyps. Biopsied.                        - Normal duodenal bulb, second portion of the duodenum                         and examined duodenum.                        - Biopsies were obtained in the gastric body, at the                         incisura and in the gastric antrum. Recommendation:        - Await pathology results.                        -  Discharge patient to home (with escort).                        - Advance diet as tolerated.                        - Continue present medications.                        - Patient has a contact number available for                         emergencies. The signs and symptoms of potential                         delayed complications were discussed with the patient.                         Return to normal activities tomorrow. Written                         discharge instructions were provided to the patient.                        -  Discharge patient to home (with escort).                        - The findings and recommendations were discussed with                         the patient.                        - The findings and recommendations were discussed with                         the patient's family. Procedure Code(s):     --- Professional ---                        [phone], Esophagogastroduodenoscopy, flexible,                         transoral; with biopsy, single or multiple Diagnosis Code(s):     --- Professional ---                        K31.7, Polyp of stomach and duodenum                        R10.13, Epigastric pain CPT copyright 2019 American Medical Association. All rights reserved. The codes documented in this report are preliminary and upon coder review may  be revised to meet current compliance requirements.  [name], MD Margretta Sidle B. Bonna Gains MD, MD 12/03/2019 9:02:08 AM This report has been signed electronically. Number of Addenda: 0 Note Initiated On: 12/03/2019 8:21 AM Estimated Blood Loss:  Estimated blood loss: none.      [facility]

## 2019-12-03 NOTE — Anesthesia Postprocedure Evaluation (Signed)
Anesthesia Post Note  Patient: Nicole Rodriguez  Procedure(s) Performed: COLONOSCOPY WITH PROPOFOL (N/A ) ESOPHAGOGASTRODUODENOSCOPY (EGD) WITH PROPOFOL (N/A )  Patient location during evaluation: Endoscopy Anesthesia Type: General Level of consciousness: awake and alert Pain management: pain level controlled Vital Signs Assessment: post-procedure vital signs reviewed and stable Respiratory status: spontaneous breathing, nonlabored ventilation, respiratory function stable and patient connected to nasal cannula oxygen Cardiovascular status: blood pressure returned to baseline and stable Postop Assessment: no apparent nausea or vomiting Anesthetic complications: no     Last Vitals:  Vitals:   12/03/19 0933 12/03/19 0943  BP: (!) 100/59 106/71  Pulse: 94 78  Resp: 19 17  Temp: 36.6 C   SpO2: 97% 100%    Last Pain:  Vitals:   12/03/19 0943  TempSrc:   PainSc: 0-No pain                 Arita Miss

## 2019-12-03 NOTE — Anesthesia Preprocedure Evaluation (Signed)
Anesthesia Evaluation  Patient identified by MRN, date of birth, ID band Patient awake    Reviewed: Allergy & Precautions, NPO status , Patient's Chart, lab work & pertinent test results  History of Anesthesia Complications Negative for: history of anesthetic complications  Airway Mallampati: II  TM Distance: >3 FB Neck ROM: Full    Dental no notable dental hx. (+) Teeth Intact   Pulmonary asthma , neg sleep apnea, neg COPD, Patient abstained from smoking.Not current smoker,  Never been hospitalized for asthma; takes only PRN inhalers once a month on average   Pulmonary exam normal breath sounds clear to auscultation       Cardiovascular Exercise Tolerance: Good METS(-) hypertension(-) CAD and (-) Past MI negative cardio ROS  (-) dysrhythmias  Rhythm:Regular Rate:Normal - Systolic murmurs    Neuro/Psych PSYCHIATRIC DISORDERS Anxiety Bipolar Disorder negative neurological ROS  negative psych ROS   GI/Hepatic GERD  ,(+)     (-) substance abuse  ,   Endo/Other  neg diabetes  Renal/GU negative Renal ROS     Musculoskeletal   Abdominal   Peds  Hematology   Anesthesia Other Findings Past Medical History: No date: Asthma No date: Bipolar disorder ([facility]) No date: Generalized anxiety disorder No date: IBS (irritable bowel syndrome) No date: Social anxiety disorder  Reproductive/Obstetrics                             Anesthesia Physical Anesthesia Plan  ASA: II  Anesthesia Plan: General   Post-op Pain Management:    Induction: Intravenous  PONV Risk Score and Plan: 3 and Ondansetron, Propofol infusion and TIVA  Airway Management Planned: Nasal Cannula  Additional Equipment: None  Intra-op Plan:   Post-operative Plan:   Informed Consent: I have reviewed the patients History and Physical, chart, labs and discussed the procedure including the risks, benefits and alternatives for  the proposed anesthesia with the patient or authorized representative who has indicated his/her understanding and acceptance.     Dental advisory given  Plan Discussed with: CRNA and Surgeon  Anesthesia Plan Comments: (Discussed risks of anesthesia with patient, including possibility of difficulty with spontaneous ventilation under anesthesia necessitating airway intervention, PONV, and rare risks such as cardiac or respiratory or neurological events. Patient understands.)        Anesthesia Quick Evaluation

## 2019-12-03 NOTE — Op Note (Signed)
[facility] Gastroenterology Patient Name: Keyoka Kunkle Procedure Date: 12/03/2019 8:20 AM MRN: [id number] Account #: [id number] Date of Birth: 19-Oct-1990 Admit Type: Outpatient Age: 29 Room: [facility] ENDO ROOM 3 Gender: Female Note Status: Finalized Procedure:             Colonoscopy Indications:           Abnormal CT of the GI tract Providers:             Elika Godar B. Bonna Gains MD, MD Referring MD:          Stoney Bang MD, MD (Referring MD) Medicines:             Monitored Anesthesia Care Complications:         No immediate complications. Procedure:             Pre-Anesthesia Assessment:                        - ASA Grade Assessment: II - A patient with mild                         systemic disease.                        - Prior to the procedure, a History and Physical was                         performed, and patient medications, allergies and                         sensitivities were reviewed. The patient's tolerance                         of previous anesthesia was reviewed.                        - The risks and benefits of the procedure and the                         sedation options and risks were discussed with the                         patient. All questions were answered and informed                         consent was obtained.                        - Patient identification and proposed procedure were                         verified prior to the procedure by the physician, the                         nurse, the anesthesiologist, the anesthetist and the                         technician. The procedure was verified in the  procedure room.                        After obtaining informed consent, the colonoscope was                         passed under direct vision. Throughout the procedure,                         the patient's blood pressure, pulse, and oxygen                         saturations were monitored continuously.  The                         Colonoscope was introduced through the anus and                         advanced to the the cecum, identified by appendiceal                         orifice and ileocecal valve. The colonoscopy was                         performed with ease. The patient tolerated the                         procedure well. The quality of the bowel preparation                         was good. Findings:      The perianal and digital rectal examinations were normal.      A 7 mm polyp was found in the cecum. The polyp was flat. The polyp was       removed with a cold snare. Resection and retrieval were complete. To       prevent bleeding after the polypectomy, one hemostatic clip was       successfully placed. There was no bleeding at the end of the procedure.      The exam was otherwise without abnormality.      The rectum, sigmoid colon, descending colon, transverse colon, ascending       colon and cecum appeared normal.      Retroflexion in the rectum was not performed due to Narrow rectum.       Careful frontal view of the rectum was otherwise normal. Impression:            - One 7 mm polyp in the cecum, removed with a cold                         snare. Resected and retrieved. Clip was placed.                        - The examination was otherwise normal.                        - The rectum, sigmoid colon, descending colon,                         transverse colon, ascending  colon and cecum are normal. Recommendation:        - Discharge patient to home (with escort).                        - Advance diet as tolerated.                        - Continue present medications.                        - Await pathology results.                        - Repeat colonoscopy date to be determined after                         pending pathology results are reviewed.                        - The findings and recommendations were discussed with                         the patient.                         - The findings and recommendations were discussed with                         the patient's family.                        - Return to primary care physician as previously                         scheduled. Procedure Code(s):     --- Professional ---                        [phone], Colonoscopy, flexible; with removal of                         tumor(s), polyp(s), or other lesion(s) by snare                         technique Diagnosis Code(s):     --- Professional ---                        K63.5, Polyp of colon                        R93.3, Abnormal findings on diagnostic imaging of                         other parts of digestive tract CPT copyright 2019 American Medical Association. All rights reserved. The codes documented in this report are preliminary and upon coder review may  be revised to meet current compliance requirements.  [name], MD Margretta Sidle B. Bonna Gains MD, MD 12/03/2019 9:34:02 AM This report has been signed electronically. Number of Addenda: 0 Note Initiated On: 12/03/2019 8:20 AM Scope Withdrawal Time: 0 hours 18 minutes 55 seconds  Total Procedure Duration: 0 hours 24 minutes 44 seconds  Estimated Blood Loss:  Estimated blood loss: none.      [facility]

## 2019-12-04 ENCOUNTER — Ambulatory Visit (INDEPENDENT_AMBULATORY_CARE_PROVIDER_SITE_OTHER): Payer: 59 | Admitting: Psychiatry

## 2019-12-04 ENCOUNTER — Encounter: Payer: Self-pay | Admitting: *Deleted

## 2019-12-04 DIAGNOSIS — F411 Generalized anxiety disorder: Secondary | ICD-10-CM | POA: Diagnosis not present

## 2019-12-04 LAB — SURGICAL PATHOLOGY

## 2019-12-04 NOTE — Progress Notes (Signed)
Virtual Visit via Video Note  I connected with Nicole Rodriguez on 12/04/19 at  1:00 PM EDT by a video enabled telemedicine application and verified that I am speaking with the correct person using two identifiers.  Patient: Patient Home Provider: Home Office   I discussed the limitations of evaluation and management by telemedicine and the availability of in person appointments. The patient expressed understanding and agreed to proceed.  History of Present Illness: GAD   Treatment Plan Goals: 1) Nicole Rodriguez would like to increase journal entries to 3-5 times a week, from once a week to decrease anxiety symptoms and improve communication with spouse.  2) Nicole Rodriguez would like to develop new coping strategies to better manage stress/frustration with current living situation (cohabitating with in-laws). 3) Nicole Rodriguez would like to improve application of self-care routine by incorporating intentional self-care practices throughout her day to improve self-esteem and overall mental health.   Observations/Objective: Counselor met with Client for individual therapy via Webex. Counselor assessed MH symptoms and established treatment plan goals, with patient reporting no severe depressive or anxiety episodes over the past 2 months. Client presents with mild depression and moderate anxiety. Client denied suicidal ideation or self-harm behaviors.   Client shared that she had attempted therapy with another provider, but was unable to fully connect and open up. Counselor assessed daily functioning with Client reporting ability to maintain job, healthy relationship with wife, daughter and godson, and caring for basic needs/hygiene. Counselor assessed living situation, with client reporting stressors with moving in with in-laws and need for permanent housing for her family. Counselor assessed use of coping skills/strategies. Client was able to name a variety of skills she continues to use learned in the IOP program.  Counselor and Client worked to create treatment plan goals (see above) as we move forward with the therapeutic relationship. Client would like to be seen EOW, as she will start an online college program this month.   Assessment and Plan: Counselor will continue to meet with patient to address treatment plan goals. Patient will continue to follow recommendations of providers and implement skills learned in session.  Follow Up Instructions: Counselor will send information for next session via Webex.    The patient was advised to call back or seek an in-person evaluation if the symptoms worsen or if the condition fails to improve as anticipated.  I provided 55 minutes of non-face-to-face time during this encounter.   Lise Auer, LCSW

## 2019-12-08 ENCOUNTER — Ambulatory Visit: Payer: 59 | Admitting: Gastroenterology

## 2019-12-11 ENCOUNTER — Other Ambulatory Visit (HOSPITAL_COMMUNITY): Payer: Self-pay | Admitting: Psychiatry

## 2019-12-12 ENCOUNTER — Encounter: Payer: Self-pay | Admitting: Gastroenterology

## 2019-12-16 ENCOUNTER — Other Ambulatory Visit: Payer: Self-pay

## 2019-12-16 ENCOUNTER — Encounter: Payer: Self-pay | Admitting: Gastroenterology

## 2019-12-16 ENCOUNTER — Other Ambulatory Visit (HOSPITAL_COMMUNITY): Payer: 59

## 2019-12-16 ENCOUNTER — Ambulatory Visit (INDEPENDENT_AMBULATORY_CARE_PROVIDER_SITE_OTHER): Payer: 59 | Admitting: Gastroenterology

## 2019-12-16 VITALS — BP 122/83 | HR 99 | Temp 97.6°F | Wt 167.6 lb

## 2019-12-16 DIAGNOSIS — K5900 Constipation, unspecified: Secondary | ICD-10-CM | POA: Diagnosis not present

## 2019-12-16 NOTE — Progress Notes (Signed)
[name], MD [address]  [place]  [place], [place] [address]  Main: [phone]  Fax: [phone]   Primary Care Physician: Neale Burly, MD   Plan: Abdominal pain  HPI: Nicole Rodriguez is a 30 y.o. female here for follow-up of abdominal pain.  Last week she ate at [facility] and had diarrhea that day.  She took 2 Imodium the next day and has not had a bowel movement since then, which is about 5 to 6 days ago now.  No blood in stool.  No nausea or vomiting.  Reports left-sided abdominal pain, dull, 5/10, nonradiating.  Has history of C. difficile history of vancomycin previously  Normal EGD June 2021, except for few gastric polyps.  7 mm cecal polyp removed on colonoscopy, otherwise normal.  Pathology showed fundic gland polyps in the stomach, negative H. pylori, sessile serrated polyp in the cecum.  Current Outpatient Medications  Medication Sig Dispense Refill  . albuterol (PROVENTIL) (2.5 MG/3ML) 0.083% nebulizer solution Take 2.5 mg by nebulization every 6 (six) hours as needed for wheezing or shortness of breath.    . Albuterol Sulfate (PROAIR RESPICLICK) 726 (90 Base) MCG/ACT AEPB Inhale 1-2 puffs into the lungs every 6 (six) hours as needed (shortness of breath).    Marland Kitchen buPROPion (WELLBUTRIN XL) 300 MG 24 hr tablet Take 1 tablet (300 mg total) by mouth daily. 30 tablet 2  . busPIRone (BUSPAR) 15 MG tablet Take 1 tablet (15 mg total) by mouth 2 (two) times daily. 60 tablet 2  . ciclopirox (PENLAC) 8 % solution Apply topically at bedtime. Apply over nail and surrounding skin. Apply daily over previous coat. After seven (7) days, may remove with alcohol and continue cycle. 6.6 mL 4  . dicyclomine (BENTYL) 20 MG tablet Take 1 tablet (20 mg total) by mouth 2 (two) times daily. 20 tablet 0  . fluticasone (FLONASE) 50 MCG/ACT nasal spray Place 1 spray into both nostrils daily. 16 g 5  . lamoTRIgine (LAMICTAL) 200 MG tablet TAKE 1 TABLET(200 MG) BY MOUTH AT BEDTIME 30  tablet 2  . LATUDA 20 MG TABS tablet TAKE 1 TABLET(20 MG) BY MOUTH DAILY AFTER SUPPER 30 tablet 2  . levocetirizine (XYZAL) 5 MG tablet Take 1 tablet (5 mg total) by mouth every evening. 30 tablet 5  . LORazepam (ATIVAN) 0.5 MG tablet Take 1 tablet (0.5 mg total) by mouth 2 (two) times daily as needed for anxiety. 60 tablet 2  . montelukast (SINGULAIR) 10 MG tablet Take 1 tablet (10 mg total) by mouth at bedtime. 30 tablet 5  . pantoprazole (PROTONIX) 40 MG tablet Take 1 tablet (40 mg total) by mouth 2 (two) times daily. 60 tablet 5  . PREVIDENT 5000 BOOSTER PLUS 1.1 % PSTE     . SYMBICORT 160-4.5 MCG/ACT inhaler INHALE 2 PUFF PO BID    . traZODone (DESYREL) 50 MG tablet Take 1 tablet (50 mg total) by mouth at bedtime as needed for sleep. 30 tablet 2   Current Facility-Administered Medications  Medication Dose Route Frequency Provider Last Rate Last Admin  . triamcinolone acetonide (KENALOG) 10 MG/ML injection 10 mg  10 mg Other Once Trula Slade, DPM        Allergies as of 12/16/2019 - Review Complete 12/16/2019  Allergen Reaction Noted  . Amoxicillin Hives 10/06/2014  . Asa [aspirin] Hives 10/06/2014  . Cefoxitin Itching and Rash 06/03/2016  . Sulfa antibiotics Hives 10/06/2014    ROS:  General: Negative for  anorexia, weight loss, fever, chills, fatigue, weakness. ENT: Negative for hoarseness, difficulty swallowing , nasal congestion. CV: Negative for chest pain, angina, palpitations, dyspnea on exertion, peripheral edema.  Respiratory: Negative for dyspnea at rest, dyspnea on exertion, cough, sputum, wheezing.  GI: See history of present illness. GU:  Negative for dysuria, hematuria, urinary incontinence, urinary frequency, nocturnal urination.  Endo: Negative for unusual weight change.    Physical Examination:   BP 122/83   Pulse 99   Temp 97.6 F (36.4 C) (Oral)   Wt 167 lb 9.6 oz (76 kg)   BMI 30.65 kg/m   General: Well-nourished, well-developed in no acute  distress.  Eyes: No icterus. Conjunctivae pink. Mouth: Oropharyngeal mucosa moist and pink , no lesions erythema or exudate. Neck: Supple, Trachea midline Abdomen: Bowel sounds are normal, nontender, nondistended, no hepatosplenomegaly or masses, no abdominal bruits or hernia , no rebound or guarding.   Extremities: No lower extremity edema. No clubbing or deformities. Neuro: Alert and oriented x 3.  Grossly intact. Skin: Warm and dry, no jaundice.   Psych: Alert and cooperative, normal mood and affect.   Labs: CMP     Component Value Date/Time   NA 136 09/21/2019 1614   NA 137 02/01/2017 0000   K 3.6 09/21/2019 1614   CL 104 09/21/2019 1614   CO2 24 09/21/2019 1614   GLUCOSE 105 (H) 09/21/2019 1614   BUN 13 09/21/2019 1614   BUN 10 02/01/2017 0000   CREATININE 0.79 09/21/2019 1614   CALCIUM 9.2 09/21/2019 1614   PROT 7.3 09/21/2019 1614   PROT 7.3 09/14/2016 1419   ALBUMIN 4.3 09/21/2019 1614   ALBUMIN 4.5 09/14/2016 1419   AST 16 09/21/2019 1614   ALT 17 09/21/2019 1614   ALKPHOS 56 09/21/2019 1614   BILITOT 0.6 09/21/2019 1614   BILITOT 0.3 09/14/2016 1419   GFRNONAA >60 09/21/2019 1614   GFRAA >60 09/21/2019 1614   Lab Results  Component Value Date   WBC 12.7 (H) 09/21/2019   HGB 13.1 09/21/2019   HCT 38.6 09/21/2019   MCV 88.5 09/21/2019   PLT 323 09/21/2019    Imaging Studies: No results found.  Assessment and Plan:   Nicole Rodriguez is a 29 y.o. y/o female with left-sided abdominal pain, and constipation for the last 5 to 6 days after taking Imodium   Patient episode of diarrhea occurred associated with eating out and resolved after 2 doses of Imodium  She is now constipated  Metamucil daily and instructions to titrate discussed with her in detail  Abdominal pain likely due to constipation  If symptoms do not resolve, can switch over to MiraLAX  Biopsies benign  Repeat colonoscopy in 5 years  Call us if symptoms worsen or do not  resolve    Dr [name]

## 2019-12-18 ENCOUNTER — Ambulatory Visit (HOSPITAL_COMMUNITY): Admit: 2019-12-18 | Payer: 59 | Admitting: Internal Medicine

## 2019-12-18 ENCOUNTER — Other Ambulatory Visit: Payer: Self-pay

## 2019-12-18 ENCOUNTER — Encounter (HOSPITAL_COMMUNITY): Payer: Self-pay

## 2019-12-18 ENCOUNTER — Telehealth (INDEPENDENT_AMBULATORY_CARE_PROVIDER_SITE_OTHER): Payer: 59 | Admitting: Psychiatry

## 2019-12-18 DIAGNOSIS — F410 Panic disorder [episodic paroxysmal anxiety] without agoraphobia: Secondary | ICD-10-CM

## 2019-12-18 DIAGNOSIS — F4010 Social phobia, unspecified: Secondary | ICD-10-CM | POA: Diagnosis not present

## 2019-12-18 DIAGNOSIS — F411 Generalized anxiety disorder: Secondary | ICD-10-CM | POA: Diagnosis not present

## 2019-12-18 DIAGNOSIS — F3181 Bipolar II disorder: Secondary | ICD-10-CM

## 2019-12-18 SURGERY — COLONOSCOPY WITH PROPOFOL
Anesthesia: Monitor Anesthesia Care

## 2019-12-18 MED ORDER — LORAZEPAM 1 MG PO TABS: 1.0000 mg | ORAL_TABLET | Freq: Two times a day (BID) | ORAL | 2 refills | Status: DC | PRN

## 2019-12-18 MED ORDER — LURASIDONE HCL 40 MG PO TABS: 40.0000 mg | ORAL_TABLET | Freq: Every day | ORAL | 2 refills | Status: DC
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
## 2019-12-25 ENCOUNTER — Other Ambulatory Visit: Payer: Self-pay

## 2019-12-25 ENCOUNTER — Encounter (HOSPITAL_COMMUNITY): Payer: Self-pay | Admitting: Psychiatry

## 2019-12-25 ENCOUNTER — Ambulatory Visit (INDEPENDENT_AMBULATORY_CARE_PROVIDER_SITE_OTHER): Payer: 59 | Admitting: Psychiatry

## 2019-12-25 DIAGNOSIS — F3181 Bipolar II disorder: Secondary | ICD-10-CM | POA: Diagnosis not present

## 2019-12-25 NOTE — Progress Notes (Signed)
Virtual Visit via Video Note  I connected with Nicole Rodriguez on 12/25/19 at  2:30 PM EDT by a video enabled telemedicine application and verified that I am speaking with the correct person using two identifiers.  Location: Patient: Patient Home Provider: Home Office   I discussed the limitations of evaluation and management by telemedicine and the availability of in person appointments. The patient expressed understanding and agreed to proceed.  History of Present Illness: GAD, MDD and PTSD   Treatment Plan Goals: 1) Nicole Rodriguez would like to increase journal entries to 3-5 times a week, from once a week to decrease anxiety symptoms and improve communication with spouse.  2) Nicole Rodriguez would like to develop new coping strategies to better manage stress/frustration with current living situation (cohabitating with in-laws). 3) Nicole Rodriguez would like to improve application of self-care routine by incorporating intentional self-care practices throughout her day to improve self-esteem and overall mental health.   Observations/Objective: Counselor met with Client for individual therapy via Webex. Counselor assessed MH symptoms and progress on treatment plan goals, with patient reporting recently experiencing a depressive episode, with increased crying, irritability, isolating, negative/ruminating thoughts and anxiety. Client stated thought support from her wife and in-laws, as well as application of skills, she has been able to rebound from depressive episode. Client presents with moderare depression and high anxiety. Client denied suicidal ideation or self-harm behaviors.   Client shared that she has been applying self-care through going on and planning vacations with family, keeping hair appointments, spending time to apply make up and fix hair in the mornings, taking time off work to address medication needs/changes, setting a schedule/structure and working towards finding alternative housing for her  family. Counselor praised client for intentional efforts to combat and prevent depressive episode. Client noted support and empathy from wife and in-laws, making her recovery time shorter. Client added the support from co=workers and emotional support pet as helpful as well. Counselor used CBT techniques to address cognitive distortions, focusing on reality and future planning. Client plans to add walking and playing video games back to self-care plan. Client noted that to journal regularly is not possible at this time due to work/school/living situation, noting the missed benefit of the practice.   Assessment and Plan: Counselor will continue to meet with patient to address treatment plan goals. Patient will continue to follow recommendations of providers and implement skills learned in session.  Follow Up Instructions: Counselor will send information for next session via Webex.    The patient was advised to call back or seek an in-person evaluation if the symptoms worsen or if the condition fails to improve as anticipated.  I provided 45 minutes of non-face-to-face time during this encounter.   Lise Auer, LCSW

## 2019-12-30 ENCOUNTER — Other Ambulatory Visit: Payer: Self-pay

## 2019-12-30 ENCOUNTER — Ambulatory Visit (HOSPITAL_COMMUNITY): Payer: 59 | Admitting: Psychiatry

## 2020-01-01 ENCOUNTER — Telehealth (INDEPENDENT_AMBULATORY_CARE_PROVIDER_SITE_OTHER): Payer: 59 | Admitting: Psychiatry

## 2020-01-01 ENCOUNTER — Other Ambulatory Visit: Payer: Self-pay

## 2020-01-01 DIAGNOSIS — F410 Panic disorder [episodic paroxysmal anxiety] without agoraphobia: Secondary | ICD-10-CM | POA: Diagnosis not present

## 2020-01-01 DIAGNOSIS — F4010 Social phobia, unspecified: Secondary | ICD-10-CM | POA: Diagnosis not present

## 2020-01-01 DIAGNOSIS — F3181 Bipolar II disorder: Secondary | ICD-10-CM

## 2020-01-01 DIAGNOSIS — F411 Generalized anxiety disorder: Secondary | ICD-10-CM
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
## 2020-01-08 ENCOUNTER — Other Ambulatory Visit (HOSPITAL_COMMUNITY): Payer: Self-pay | Admitting: Psychiatry

## 2020-01-27 ENCOUNTER — Telehealth (HOSPITAL_COMMUNITY): Payer: Self-pay

## 2020-01-27 NOTE — Telephone Encounter (Signed)
LVM for patient regarding Short term Disability paperwork

## 2020-01-29 ENCOUNTER — Other Ambulatory Visit: Payer: Self-pay

## 2020-01-29 ENCOUNTER — Telehealth (INDEPENDENT_AMBULATORY_CARE_PROVIDER_SITE_OTHER): Payer: 59 | Admitting: Psychiatry

## 2020-01-29 DIAGNOSIS — F4010 Social phobia, unspecified: Secondary | ICD-10-CM | POA: Diagnosis not present

## 2020-01-29 DIAGNOSIS — F3181 Bipolar II disorder: Secondary | ICD-10-CM | POA: Diagnosis not present

## 2020-01-29 DIAGNOSIS — F410 Panic disorder [episodic paroxysmal anxiety] without agoraphobia: Secondary | ICD-10-CM | POA: Diagnosis not present

## 2020-01-29 DIAGNOSIS — F411 Generalized anxiety disorder: Secondary | ICD-10-CM | POA: Diagnosis not present

## 2020-01-29 MED ORDER — BUSPIRONE HCL 30 MG PO TABS: 30.0000 mg | ORAL_TABLET | Freq: Two times a day (BID) | ORAL | 2 refills | Status: DC

## 2020-01-29 MED ORDER — LORAZEPAM 1 MG PO TABS: 1.0000 mg | ORAL_TABLET | Freq: Three times a day (TID) | ORAL | 2 refills | Status: AC | PRN
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
## 2020-02-05 ENCOUNTER — Ambulatory Visit (HOSPITAL_COMMUNITY): Payer: 59 | Admitting: Psychiatry

## 2020-02-05 ENCOUNTER — Other Ambulatory Visit: Payer: Self-pay

## 2020-02-10 ENCOUNTER — Telehealth (HOSPITAL_COMMUNITY): Payer: 59 | Admitting: Psychiatry

## 2020-02-11 ENCOUNTER — Other Ambulatory Visit: Payer: Self-pay | Admitting: Obstetrics and Gynecology

## 2020-02-11 MED ORDER — ALBUTEROL SULFATE (2.5 MG/3ML) 0.083% IN NEBU: 2.5000 mg | INHALATION_SOLUTION | Freq: Four times a day (QID) | RESPIRATORY_TRACT | 3 refills | Status: DC | PRN

## 2020-02-25 ENCOUNTER — Other Ambulatory Visit: Payer: Self-pay

## 2020-02-25 ENCOUNTER — Encounter (HOSPITAL_COMMUNITY): Payer: Self-pay | Admitting: Psychiatry

## 2020-02-25 ENCOUNTER — Ambulatory Visit (INDEPENDENT_AMBULATORY_CARE_PROVIDER_SITE_OTHER): Payer: 59 | Admitting: Psychiatry

## 2020-02-25 DIAGNOSIS — F411 Generalized anxiety disorder: Secondary | ICD-10-CM | POA: Diagnosis not present

## 2020-02-25 DIAGNOSIS — F3181 Bipolar II disorder: Secondary | ICD-10-CM

## 2020-02-25 NOTE — Progress Notes (Signed)
Virtual Visit via Video Note  I connected with Nicole Rodriguez on 02/25/20 at  1:30 PM EDT by a video enabled telemedicine application and verified that I am speaking with the correct person using two identifiers.  Location: Patient: Patient Home Provider: Home Office   I discussed the limitations of evaluation and management by telemedicine and the availability of in person appointments. The patient expressed understanding and agreed to proceed.  History of Present Illness: GAD and Bipolar 2 DO  Treatment Plan Goals: 1) Nicole Rodriguez would like to increase journal entries to 3-5 times a week, from once a week to decrease anxiety symptoms and improve communication with spouse.  2) Nicole Rodriguez would like to develop new coping strategies to better manage stress/frustration with current living situation (cohabitating with in-laws). 3) Nicole Rodriguez would like to improve application of self-care routine by incorporating intentional self-care practices throughout her day to improve self-esteem and overall mental health.  Observations/Objective: Counselor met with Client for individual therapy via Webex. Counselor assessed MH symptoms and progress on treatment plan goals, with patient reporting that her depression is in remission and her anxiety is becoming more manageable, due to being able to move into own home. Client presents with mild depression and moderate anxiety. Client denied suicidal ideation or self-harm behaviors.   Client shared that she and her family were able to move to their new home a few weeks ago. In assessing goals 1-3 of applying coping skills of journaling, grounding, relaxation, positive self-talk, Client stated that she has been in a state of transition, making it difficult to get on a routine and put her needs first. Client proceeded to share that in addition to the move, her children started school, she is in school, her dog is pregnant and her whole family were diagnosed with COVID  at the end of July. Counselor assessed impact, with Client stating that she is doing ok, but taste and smell has not returned. Counselor and Client spent the remainder of the session developing a plan to practically start implementing the coping skills she has learned throughout treatment. Client to report on implementation and progress at next session.   Assessment and Plan: Counselor will continue to meet with patient to address treatment plan goals. Patient will continue to follow recommendations of providers and implement skills learned in session.  Follow Up Instructions: Counselor will send information for next session via Webex.    The patient was advised to call back or seek an in-person evaluation if the symptoms worsen or if the condition fails to improve as anticipated.  I provided 55 minutes of non-face-to-face time during this encounter.   Lise Auer, LCSW

## 2020-03-03 ENCOUNTER — Other Ambulatory Visit: Payer: Self-pay

## 2020-03-03 ENCOUNTER — Telehealth (INDEPENDENT_AMBULATORY_CARE_PROVIDER_SITE_OTHER): Payer: 59 | Admitting: Psychiatry

## 2020-03-03 DIAGNOSIS — F3181 Bipolar II disorder: Secondary | ICD-10-CM

## 2020-03-03 DIAGNOSIS — F411 Generalized anxiety disorder: Secondary | ICD-10-CM | POA: Diagnosis not present

## 2020-03-03 DIAGNOSIS — F410 Panic disorder [episodic paroxysmal anxiety] without agoraphobia: Secondary | ICD-10-CM | POA: Diagnosis not present

## 2020-03-03 MED ORDER — LAMOTRIGINE 200 MG PO TABS: ORAL_TABLET | ORAL | 2 refills | Status: DC

## 2020-03-03 MED ORDER — BUPROPION HCL ER (XL) 300 MG PO TB24: 300.0000 mg | ORAL_TABLET | Freq: Every day | ORAL | 2 refills | Status: DC

## 2020-03-03 MED ORDER — PAROXETINE HCL 20 MG PO TABS: 20.0000 mg | ORAL_TABLET | Freq: Every day | ORAL | 1 refills | Status: DC

## 2020-03-03 MED ORDER — TRAZODONE HCL 50 MG PO TABS: 50.0000 mg | ORAL_TABLET | Freq: Every evening | ORAL | 2 refills | Status: DC | PRN
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
## 2020-03-05 ENCOUNTER — Other Ambulatory Visit (HOSPITAL_COMMUNITY): Payer: Self-pay

## 2020-03-07 ENCOUNTER — Other Ambulatory Visit (HOSPITAL_COMMUNITY): Payer: Self-pay | Admitting: Psychiatry

## 2020-03-19 ENCOUNTER — Other Ambulatory Visit (HOSPITAL_COMMUNITY): Payer: Self-pay | Admitting: Psychiatry

## 2020-03-30 ENCOUNTER — Encounter (HOSPITAL_COMMUNITY): Payer: Self-pay

## 2020-04-09 ENCOUNTER — Other Ambulatory Visit (HOSPITAL_COMMUNITY): Payer: Self-pay | Admitting: Psychiatry

## 2020-04-18 ENCOUNTER — Other Ambulatory Visit: Payer: Self-pay | Admitting: Adult Health

## 2020-04-21 ENCOUNTER — Telehealth (INDEPENDENT_AMBULATORY_CARE_PROVIDER_SITE_OTHER): Payer: 59 | Admitting: Psychiatry

## 2020-04-21 ENCOUNTER — Other Ambulatory Visit: Payer: Self-pay

## 2020-04-21 DIAGNOSIS — F410 Panic disorder [episodic paroxysmal anxiety] without agoraphobia: Secondary | ICD-10-CM | POA: Diagnosis not present

## 2020-04-21 DIAGNOSIS — F411 Generalized anxiety disorder: Secondary | ICD-10-CM

## 2020-04-21 DIAGNOSIS — F3181 Bipolar II disorder: Secondary | ICD-10-CM

## 2020-04-21 MED ORDER — LAMOTRIGINE 200 MG PO TABS: 200.0000 mg | ORAL_TABLET | Freq: Every evening | ORAL | 5 refills | Status: DC

## 2020-04-21 MED ORDER — LURASIDONE HCL 40 MG PO TABS: ORAL_TABLET | ORAL | 2 refills | Status: DC

## 2020-04-21 MED ORDER — PAROXETINE HCL 20 MG PO TABS: ORAL_TABLET | ORAL | 5 refills | Status: DC

## 2020-04-21 MED ORDER — TRAZODONE HCL 50 MG PO TABS: 50.0000 mg | ORAL_TABLET | Freq: Every evening | ORAL | 2 refills | Status: DC | PRN

## 2020-04-21 MED ORDER — BUPROPION HCL ER (XL) 300 MG PO TB24: 300.0000 mg | ORAL_TABLET | Freq: Every day | ORAL | 2 refills | Status: DC
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
## 2020-05-15 ENCOUNTER — Other Ambulatory Visit: Payer: Self-pay | Admitting: Emergency Medicine

## 2020-05-19 ENCOUNTER — Other Ambulatory Visit: Payer: Self-pay

## 2020-05-19 ENCOUNTER — Telehealth (HOSPITAL_COMMUNITY): Payer: 59 | Admitting: Psychiatry

## 2020-05-19 DIAGNOSIS — F4010 Social phobia, unspecified: Secondary | ICD-10-CM

## 2020-05-19 DIAGNOSIS — F411 Generalized anxiety disorder: Secondary | ICD-10-CM

## 2020-05-19 DIAGNOSIS — F3181 Bipolar II disorder: Secondary | ICD-10-CM

## 2020-05-19 DIAGNOSIS — F410 Panic disorder [episodic paroxysmal anxiety] without agoraphobia: Secondary | ICD-10-CM

## 2020-05-19 MED ORDER — BUPROPION HCL ER (XL) 300 MG PO TB24: 300.0000 mg | ORAL_TABLET | Freq: Every day | ORAL | 1 refills | Status: DC

## 2020-05-19 MED ORDER — LURASIDONE HCL 40 MG PO TABS: 40.0000 mg | ORAL_TABLET | Freq: Every evening | ORAL | 1 refills | Status: DC

## 2020-05-19 MED ORDER — LORAZEPAM 1 MG PO TABS: 1.0000 mg | ORAL_TABLET | Freq: Two times a day (BID) | ORAL | 1 refills | Status: AC | PRN

## 2020-05-19 MED ORDER — LAMOTRIGINE 200 MG PO TABS: 200.0000 mg | ORAL_TABLET | Freq: Every evening | ORAL | 1 refills | Status: DC

## 2020-05-19 MED ORDER — PAROXETINE HCL 20 MG PO TABS: 20.0000 mg | ORAL_TABLET | Freq: Every day | ORAL | 1 refills | Status: DC
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
## 2020-07-01 ENCOUNTER — Other Ambulatory Visit: Payer: Self-pay

## 2020-07-01 ENCOUNTER — Telehealth (INDEPENDENT_AMBULATORY_CARE_PROVIDER_SITE_OTHER): Payer: 59 | Admitting: Psychiatry

## 2020-07-01 DIAGNOSIS — F3181 Bipolar II disorder: Secondary | ICD-10-CM | POA: Diagnosis not present

## 2020-07-01 DIAGNOSIS — F411 Generalized anxiety disorder: Secondary | ICD-10-CM

## 2020-07-01 DIAGNOSIS — F410 Panic disorder [episodic paroxysmal anxiety] without agoraphobia: Secondary | ICD-10-CM

## 2020-07-01 MED ORDER — BUPROPION HCL ER (SR) 200 MG PO TB12: 200.0000 mg | ORAL_TABLET | Freq: Two times a day (BID) | ORAL | 2 refills | Status: DC
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
## 2020-07-13 ENCOUNTER — Telehealth (HOSPITAL_COMMUNITY): Payer: 59 | Admitting: Psychiatry

## 2020-08-02 ENCOUNTER — Telehealth (INDEPENDENT_AMBULATORY_CARE_PROVIDER_SITE_OTHER): Payer: 59 | Admitting: Psychiatry

## 2020-08-02 ENCOUNTER — Other Ambulatory Visit: Payer: Self-pay

## 2020-08-02 DIAGNOSIS — F4010 Social phobia, unspecified: Secondary | ICD-10-CM | POA: Diagnosis not present

## 2020-08-02 DIAGNOSIS — F410 Panic disorder [episodic paroxysmal anxiety] without agoraphobia: Secondary | ICD-10-CM | POA: Diagnosis not present

## 2020-08-02 DIAGNOSIS — F411 Generalized anxiety disorder: Secondary | ICD-10-CM | POA: Diagnosis not present

## 2020-08-02 DIAGNOSIS — F3181 Bipolar II disorder: Secondary | ICD-10-CM | POA: Diagnosis not present

## 2020-08-02 MED ORDER — TRAZODONE HCL 50 MG PO TABS: 50.0000 mg | ORAL_TABLET | Freq: Every evening | ORAL | 2 refills | Status: DC | PRN

## 2020-08-02 MED ORDER — LORAZEPAM 1 MG PO TABS: 1.0000 mg | ORAL_TABLET | Freq: Every day | ORAL | 2 refills | Status: DC | PRN

## 2020-08-02 MED ORDER — BUPROPION HCL ER (SR) 200 MG PO TB12: 200.0000 mg | ORAL_TABLET | Freq: Two times a day (BID) | ORAL | 2 refills | Status: DC
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
## 2020-08-09 ENCOUNTER — Telehealth: Payer: Self-pay

## 2020-08-09 NOTE — Telephone Encounter (Signed)
Yes - PCP.

## 2020-08-09 NOTE — Telephone Encounter (Signed)
Pt calling; had elevated Bps over the weekend; what to do; rx?  [phone]

## 2020-08-10 NOTE — Telephone Encounter (Signed)
LMTC

## 2020-08-13 NOTE — Telephone Encounter (Signed)
Pt has appt c PCP on the 18th.

## 2020-08-20 ENCOUNTER — Other Ambulatory Visit: Payer: Self-pay

## 2020-08-20 ENCOUNTER — Ambulatory Visit (INDEPENDENT_AMBULATORY_CARE_PROVIDER_SITE_OTHER): Payer: 59 | Admitting: Internal Medicine

## 2020-08-20 ENCOUNTER — Encounter: Payer: Self-pay | Admitting: Internal Medicine

## 2020-08-20 VITALS — BP 127/86 | HR 93 | Temp 98.8°F | Resp 18 | Ht 62.0 in | Wt 203.1 lb

## 2020-08-20 DIAGNOSIS — K219 Gastro-esophageal reflux disease without esophagitis: Secondary | ICD-10-CM | POA: Diagnosis not present

## 2020-08-20 DIAGNOSIS — Z7689 Persons encountering health services in other specified circumstances: Secondary | ICD-10-CM

## 2020-08-20 DIAGNOSIS — J45909 Unspecified asthma, uncomplicated: Secondary | ICD-10-CM | POA: Diagnosis not present

## 2020-08-20 DIAGNOSIS — J301 Allergic rhinitis due to pollen: Secondary | ICD-10-CM

## 2020-08-20 DIAGNOSIS — F3181 Bipolar II disorder: Secondary | ICD-10-CM | POA: Diagnosis not present

## 2020-08-20 DIAGNOSIS — R002 Palpitations: Secondary | ICD-10-CM

## 2020-08-20 NOTE — Patient Instructions (Addendum)
Please continue to take medications as prescribed.  Please follow low salt diet and perform regular exercise/walking at least 150 mins/week.  You are advised to get COVID vaccine.  Please get fasting blood tests done before the next visit.

## 2020-08-20 NOTE — Assessment & Plan Note (Signed)
EKG: Sinus rhythm. HR: 93. No signs of active ischemia. Palpitations related to anxiety/panic episodes Elevated BP at work could be due to situational anxiety, currently BP stable at office.

## 2020-08-20 NOTE — Assessment & Plan Note (Addendum)
Well-controlled Flonase On Xyzal 5 mg QD and Singulair 10 mg QD

## 2020-08-20 NOTE — Assessment & Plan Note (Addendum)
Well-controlled with Albuterol inhaler PRN, uses it about once in a week On Singulair and Xyzal

## 2020-08-20 NOTE — Assessment & Plan Note (Signed)
On Pantoprazole 40 mg QD

## 2020-08-20 NOTE — Assessment & Plan Note (Signed)
On Paxil, Wellbutrin, Latuda Trazodone PRN for insomnia Ativan 1 mg Q8h PRN for anxiety Follows up with Psychiatry

## 2020-08-20 NOTE — Assessment & Plan Note (Signed)
Care established Previous chart reviewed History and medications reviewed with the patient 

## 2020-08-20 NOTE — Progress Notes (Signed)
 New Patient Office Visit  Subjective:  Patient ID: Nicole Rodriguez, female    DOB: 04/16/1991  Age: 30 y.o. MRN: [id number]  CC:  Chief Complaint  Patient presents with  . 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  She has been doing well overall. She had an episode of elevated BP at work - 157/97 while sitting. She was having palpitations at that time, which prompted her to check her BP. Her BP is 127/86 in the office today. She denies any headache, chest pain or dyspnea currently. She has a h/o anxiety/panic disorder, for which she takes Paxil, Wellbutrin and Ativan (PRN). She follows up with Psychiatry. She also takes Lamictal and Latuda for her h/o bipolar disorder.  She has h/o chronic constipation, and had EGD and colonoscopy in 2021. She was found to gastric and colonic polyps, which were removed. She follows up with GI. She takes Pantoprazole for GERD.  She had last PAP smear around 08/2016. She follows up with OB/GYN, but needs to schedule appointment.  She has not had COVID or flu vaccine.  Past Medical History:  Diagnosis Date  . Asthma   . Bipolar disorder (HCC)   . Generalized anxiety disorder   . GERD (gastroesophageal reflux disease)    [employer]reesia 08/17/2020  . IBS (irritable bowel syndrome)   . Plantar fasciitis 10/15/2018  . Rectal bleeding 09/24/2019  . Social anxiety disorder     Past Surgical History:  Procedure Laterality Date  . APPENDECTOMY    . BREAST LUMPECTOMY    . COLONOSCOPY WITH PROPOFOL N/A 12/03/2019   Procedure: COLONOSCOPY WITH PROPOFOL;  Surgeon: Tahiliani, Varnita B, MD;  Location: [facility] ENDOSCOPY;  Service: Endoscopy;  Laterality: N/A;  .  ESOPHAGOGASTRODUODENOSCOPY (EGD) WITH PROPOFOL N/A 12/03/2019   Procedure: ESOPHAGOGASTRODUODENOSCOPY (EGD) WITH PROPOFOL;  Surgeon: Tahiliani, Varnita B, MD;  Location: [facility] ENDOSCOPY;  Service: Endoscopy;  Laterality: N/A;  . FOOT SURGERY      Family History  Problem Relation Age of Onset  . Asthma Father   . Diabetes Other   . Heart failure Other   . Anxiety disorder Maternal Grandmother   . Depression Maternal Grandmother   . Crohn's disease Sister        half sister  . Diverticulitis Paternal Grandmother   . Colon cancer Neg Hx   . Celiac disease Neg Hx     Social History   Socioeconomic History  . Marital status: Married    Spouse name: Not on file  . Number of children: 1  . Years of education: Not on file  . Highest education level: Not on file  Occupational History  . Not on file  Tobacco Use  . Smoking status: Never Smoker  . Smokeless tobacco: Never Used  Vaping Use  . Vaping Use: Never used  Substance and Sexual Activity  . Alcohol use: Yes    Comment: socially   . Drug use: No  . Sexual activity: Yes    Birth control/protection: None  Other Topics Concern  . Not on file  Social History Narrative   Daughter 3 years old.   Social Determinants of Health   Financial Resource Strain: Not on file  Food   Insecurity: Not on file  Transportation Needs: Not on file  Physical Activity: Not on file  Stress: Not on file  Social Connections: Not on file  Intimate Partner Violence: Not on file    ROS Review of Systems  Constitutional: Negative for chills and fever.  HENT: Negative for congestion, sinus pressure, sinus pain and sore throat.   Eyes: Negative for pain and discharge.  Respiratory: Negative for cough and shortness of breath.   Cardiovascular: Positive for palpitations. Negative for chest pain.  Gastrointestinal: Negative for abdominal pain, constipation, diarrhea, nausea and vomiting.  Endocrine: Negative for polydipsia and polyuria.   Genitourinary: Negative for dysuria and hematuria.  Musculoskeletal: Negative for neck pain and neck stiffness.  Skin: Negative for rash.  Neurological: Negative for dizziness and weakness.  Psychiatric/Behavioral: Positive for sleep disturbance. Negative for agitation and behavioral problems. The patient is nervous/anxious.     Objective:   Today's Vitals: BP 127/86 (BP Location: Right Arm, Patient Position: Sitting, Cuff Size: Normal)   Pulse 93   Temp 98.8 F (37.1 C) (Oral)   Resp 18   Ht 5' 2" (1.575 m)   Wt 203 lb 1.9 oz (92.1 kg)   SpO2 98%   BMI 37.15 kg/m   Physical Exam Vitals reviewed.  Constitutional:      General: She is not in acute distress.    Appearance: She is obese. She is not diaphoretic.  HENT:     Head: Normocephalic and atraumatic.     Nose: Nose normal.     Mouth/Throat:     Mouth: Mucous membranes are moist.  Eyes:     General: No scleral icterus.    Extraocular Movements: Extraocular movements intact.     Pupils: Pupils are equal, round, and reactive to light.  Cardiovascular:     Rate and Rhythm: Normal rate and regular rhythm.     Pulses: Normal pulses.     Heart sounds: Normal heart sounds. No murmur heard.   Pulmonary:     Breath sounds: Normal breath sounds. No wheezing or rales.  Musculoskeletal:     Cervical back: Neck supple. No tenderness.     Right lower leg: No edema.     Left lower leg: No edema.  Skin:    General: Skin is warm.     Findings: No rash.  Neurological:     General: No focal deficit present.     Mental Status: She is alert and oriented to person, place, and time.  Psychiatric:        Mood and Affect: Mood normal.        Behavior: Behavior normal.     Assessment & Plan:   Problem List Items Addressed This Visit      Encounter to establish care - Primary   Care established Previous chart reviewed History and medications reviewed with the patient     Relevant Orders  CBC with Differential   CMP14+EGFR  Hemoglobin A1c  Lipid panel  TSH + free T4  Vitamin D (25 hydroxy)    Respiratory   Asthma    Well-controlled with Albuterol inhaler PRN, uses it about once in a week On Singulair and Xyzal      Allergic rhinitis    Well-controlled Flonase On Xyzal 5 mg QD and Singulair 10 mg QD        Digestive   GERD (gastroesophageal reflux disease)    On Pantoprazole 40 mg QD        Other  Bipolar 2 disorder (HCC)    On Paxil, Wellbutrin, Latuda Trazodone PRN for insomnia Ativan 1 mg Q8h PRN for anxiety Follows up with Psychiatry      Intermittent palpitations    EKG: Sinus rhythm. HR: 93. No signs of active ischemia. Palpitations related to anxiety/panic episodes Elevated BP at work could be due to situational anxiety, currently BP stable at office.       Outpatient Encounter Medications as of 08/20/2020  Medication Sig  . albuterol (PROVENTIL) (2.5 MG/3ML) 0.083% nebulizer solution Take 3 mLs (2.5 mg total) by nebulization every 6 (six) hours as needed for wheezing or shortness of breath.  . Albuterol Sulfate (PROAIR RESPICLICK) 108 (90 Base) MCG/ACT AEPB Inhale 1-2 puffs into the lungs every 6 (six) hours as needed (shortness of breath).  . [START ON 09/30/2020] buPROPion (WELLBUTRIN SR) 200 MG 12 hr tablet Take 1 tablet (200 mg total) by mouth 2 (two) times daily. Please take the second dose not later than 4 PM.  . fluticasone (FLONASE) 50 MCG/ACT nasal spray Place 1 spray into both nostrils daily.  . lamoTRIgine (LAMICTAL) 200 MG tablet Take 1 tablet (200 mg total) by mouth at bedtime.  . levocetirizine (XYZAL) 5 MG tablet TAKE 1 TABLET(5 MG) BY MOUTH EVERY EVENING  . LORazepam (ATIVAN) 1 MG tablet Take 1 tablet (1 mg total) by mouth daily as needed for anxiety. (Patient taking differently: Take 1 mg by mouth every 8 (eight) hours as needed for anxiety.)  . lurasidone (LATUDA) 40 MG TABS tablet Take 1 tablet (40 mg total) by mouth at bedtime. TAKE 1 TABLET(40  MG) BY MOUTH DAILY WITH SUPPER  . montelukast (SINGULAIR) 10 MG tablet Take 1 tablet (10 mg total) by mouth at bedtime.  . pantoprazole (PROTONIX) 40 MG tablet TAKE 1 TABLET(40 MG) BY MOUTH TWICE DAILY  . PARoxetine (PAXIL) 20 MG tablet Take 1 tablet (20 mg total) by mouth daily. TAKE 1 TABLET(20 MG) BY MOUTH DAILY AT 6 PM  . [START ON 08/30/2020] traZODone (DESYREL) 50 MG tablet Take 1 tablet (50 mg total) by mouth at bedtime as needed for sleep.  . [DISCONTINUED] ciclopirox (PENLAC) 8 % solution Apply topically at bedtime. Apply over nail and surrounding skin. Apply daily over previous coat. After seven (7) days, may remove with alcohol and continue cycle. (Patient not taking: Reported on 08/20/2020)  . [DISCONTINUED] dicyclomine (BENTYL) 20 MG tablet Take 1 tablet (20 mg total) by mouth 2 (two) times daily. (Patient not taking: Reported on 08/20/2020)  . [DISCONTINUED] PREVIDENT 5000 BOOSTER PLUS 1.1 % PSTE  (Patient not taking: Reported on 08/20/2020)  . [DISCONTINUED] SYMBICORT 160-4.5 MCG/ACT inhaler INHALE 2 PUFF PO BID (Patient not taking: Reported on 08/20/2020)  . [DISCONTINUED] triamcinolone acetonide (KENALOG) 10 MG/ML injection 10 mg    No facility-administered encounter medications on file as of 08/20/2020.    Follow-up: Return in about 4 months (around 12/18/2020).    K , MD 

## 2020-08-27 ENCOUNTER — Other Ambulatory Visit: Payer: Self-pay

## 2020-08-27 ENCOUNTER — Telehealth (INDEPENDENT_AMBULATORY_CARE_PROVIDER_SITE_OTHER): Payer: 59 | Admitting: Psychiatry

## 2020-08-27 DIAGNOSIS — F411 Generalized anxiety disorder: Secondary | ICD-10-CM

## 2020-08-27 DIAGNOSIS — F4010 Social phobia, unspecified: Secondary | ICD-10-CM

## 2020-08-27 DIAGNOSIS — F410 Panic disorder [episodic paroxysmal anxiety] without agoraphobia: Secondary | ICD-10-CM

## 2020-08-27 DIAGNOSIS — F3181 Bipolar II disorder: Secondary | ICD-10-CM | POA: Diagnosis not present

## 2020-08-27 MED ORDER — LORAZEPAM 2 MG PO TABS: 2.0000 mg | ORAL_TABLET | Freq: Three times a day (TID) | ORAL | 1 refills | Status: DC | PRN
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
## 2020-09-06 ENCOUNTER — Emergency Department (HOSPITAL_COMMUNITY): Payer: 59

## 2020-09-06 ENCOUNTER — Ambulatory Visit: Payer: Self-pay

## 2020-09-06 ENCOUNTER — Emergency Department (HOSPITAL_COMMUNITY)
Admission: EM | Admit: 2020-09-06 | Discharge: 2020-09-06 | Disposition: A | Discharge status: Home / Self Care | Payer: 59 | Attending: Emergency Medicine | Admitting: Emergency Medicine

## 2020-09-06 ENCOUNTER — Other Ambulatory Visit: Payer: Self-pay

## 2020-09-06 DIAGNOSIS — Z9011 Acquired absence of right breast and nipple: Secondary | ICD-10-CM | POA: Insufficient documentation

## 2020-09-06 DIAGNOSIS — J45909 Unspecified asthma, uncomplicated: Secondary | ICD-10-CM | POA: Diagnosis not present

## 2020-09-06 DIAGNOSIS — Z7952 Long term (current) use of systemic steroids: Secondary | ICD-10-CM | POA: Diagnosis not present

## 2020-09-06 DIAGNOSIS — U071 COVID-19: Secondary | ICD-10-CM | POA: Diagnosis not present

## 2020-09-06 DIAGNOSIS — K219 Gastro-esophageal reflux disease without esophagitis: Secondary | ICD-10-CM | POA: Diagnosis not present

## 2020-09-06 DIAGNOSIS — R0602 Shortness of breath: Secondary | ICD-10-CM

## 2020-09-06 LAB — CBC WITH DIFFERENTIAL/PLATELET
Abs Immature Granulocytes: 0.04 10*3/uL (ref 0.00–0.07)
Basophils Absolute: 0.1 10*3/uL (ref 0.0–0.1)
Basophils Relative: 1 %
Eosinophils Absolute: 0.3 10*3/uL (ref 0.0–0.5)
Eosinophils Relative: 3 %
HCT: 39.9 % (ref 36.0–46.0)
Hemoglobin: 13.2 g/dL (ref 12.0–15.0)
Immature Granulocytes: 0 %
Lymphocytes Relative: 32 %
Lymphs Abs: 3.3 10*3/uL (ref 0.7–4.0)
MCH: 29.6 pg (ref 26.0–34.0)
MCHC: 33.1 g/dL (ref 30.0–36.0)
MCV: 89.5 fL (ref 80.0–100.0)
Monocytes Absolute: 0.9 10*3/uL (ref 0.1–1.0)
Monocytes Relative: 9 %
Neutro Abs: 5.9 10*3/uL (ref 1.7–7.7)
Neutrophils Relative %: 55 %
Platelets: 305 10*3/uL (ref 150–400)
RBC: 4.46 MIL/uL (ref 3.87–5.11)
RDW: 12.9 % (ref 11.5–15.5)
WBC: 10.5 10*3/uL (ref 4.0–10.5)
nRBC: 0 % (ref 0.0–0.2)

## 2020-09-06 LAB — TROPONIN I (HIGH SENSITIVITY)
Troponin I (High Sensitivity): <2 ng/L (ref <18)
Troponin I (High Sensitivity): <2 ng/L (ref <18)

## 2020-09-06 LAB — RESP PANEL BY RT-PCR (FLU A&B, COVID) ARPGX2
Influenza A by PCR: NEGATIVE
Influenza B by PCR: NEGATIVE
SARS Coronavirus 2 by RT PCR: POSITIVE — AB

## 2020-09-06 LAB — BASIC METABOLIC PANEL
Anion gap: 9 (ref 5–15)
BUN: 9 mg/dL (ref 6–20)
CO2: 25 mmol/L (ref 22–32)
Calcium: 8.8 mg/dL — ABNORMAL LOW (ref 8.9–10.3)
Chloride: 102 mmol/L (ref 98–111)
Creatinine, Ser: 0.87 mg/dL (ref 0.44–1.00)
GFR, Estimated: >60 mL/min (ref >60)
Glucose, Bld: 93 mg/dL (ref 70–99)
Potassium: 3.8 mmol/L (ref 3.5–5.1)
Sodium: 136 mmol/L (ref 135–145)

## 2020-09-06 LAB — HCG, SERUM, QUALITATIVE: Preg, Serum: NEGATIVE

## 2020-09-06 LAB — D-DIMER, QUANTITATIVE: D-Dimer, Quant: 0.27 ug/mL-FEU (ref 0.00–0.50)

## 2020-09-06 MED ORDER — ALBUTEROL SULFATE HFA 108 (90 BASE) MCG/ACT IN AERS
2.0000 | INHALATION_SPRAY | Freq: Once | RESPIRATORY_TRACT | Status: AC
  Administered 2020-09-06: 2 via RESPIRATORY_TRACT
  Filled 2020-09-06: qty 6.7

## 2020-09-06 MED ORDER — BENZONATATE 100 MG PO CAPS: 200.0000 mg | ORAL_CAPSULE | Freq: Three times a day (TID) | ORAL | 0 refills | Status: DC

## 2020-09-06 NOTE — ED Notes (Signed)
Had patient ambulate with pulse oximeter on, patient done great was at 100% the whole time.

## 2020-09-06 NOTE — Discharge Instructions (Signed)
You have tested positive for COVID-19 virus.  Please continue to quarantine at home and monitor your symptoms closely. You chest x-ray was clear. Antibiotics are not helpful in treating viral infection, the virus should run its course in about 5-7 days. Please make sure you are drinking plenty of fluids. You can treat your symptoms supportively with tylenol for fevers and pains, and prescribed cough medication. If your symptoms are not improving please follow up with you Primary doctor.   I recommend that you purchase a home pulse ox to help better monitor your oxygen at home, if you start to have increased work of breathing or shortness of breath or your oxygen drops below 90% please immediately return to the hospital for reevaluation.  If you develop persistent fevers, shortness of breath or difficulty breathing, chest pain, severe headache and neck pain, persistent nausea and vomiting or other new or concerning symptoms return to the Emergency department.

## 2020-09-06 NOTE — ED Notes (Signed)
Date and time results received: 09/06/20 & 1125hrs (use smartphrase ".now" to insert current time)  Test: COVID Critical Value: POSITIVE  Name of Provider Notified: Dr. Langston Masker  Orders Received? Or Actions Taken?: Notified

## 2020-09-06 NOTE — ED Triage Notes (Signed)
Pt has congestion, stated fever,feeling heavy in chest area, skin warm and dry, temp 97.9, dry cough states she took at home COVID test that was negative

## 2020-09-06 NOTE — ED Provider Notes (Signed)
[facility] EMERGENCY DEPARTMENT Provider Note   CSN: [id number] Arrival date & time: 09/06/20  9357     History Chief Complaint  Patient presents with  . Chest Pain  . Cough  . Shortness of Breath    Nicole Rodriguez is a 30 y.o. female.  Nicole Rodriguez is a 30 y.o. female with a history of asthma, IBS, GERD, bipolar disorder, anxiety, who presents to the emergency department for evaluation of 3 days of subjective fevers, chills, congestion, cough, chest pain and shortness of breath.  Symptoms started Saturday evening, but this morning she woke up feeling much worse with chest pain and shortness of breath, shortness of breath worse with activity.  Patient reports cough is productive of sputum.  Unsure how high fevers have been at home.  Reports some myalgias and fatigue as well.  No lower extremity pain or swelling.  No lightheadedness or syncope.  Patient unsure of any sick contacts but she is around different people at work daily.  She took a home Covid test when symptoms first started that was negative.  Reports she is unvaccinated.  Denies any abdominal pain, nausea, vomiting or diarrhea.  No loss of taste or smell.  Reports this feels different than her asthma management denies history of other lung problems or heart problems.  Denies smoking.  No other aggravating or alleviating factors.        Past Medical History:  Diagnosis Date  . Asthma   . Bipolar disorder ([place])   . Generalized anxiety disorder   . GERD (gastroesophageal reflux disease)    Phreesia 08/17/2020  . IBS (irritable bowel syndrome)   . Plantar fasciitis 10/15/2018  . Rectal bleeding 09/24/2019  . Social anxiety disorder     Patient Active Problem List   Diagnosis Date Noted  . Intermittent palpitations 08/20/2020  . Encounter to establish care 08/20/2020  . Gastric polyp   . GAD (generalized anxiety disorder) 07/19/2019  . Social anxiety disorder 07/19/2019  . Panic disorder 07/19/2019  . Bipolar 2  disorder ([place]) 07/19/2019  . Allergic rhinitis 08/19/2018  . GERD (gastroesophageal reflux disease) 08/19/2018  . Asthma 11/29/2017  . Breast mass, right 03/18/2014  . Numbness and tingling in both hands 01/26/2014    Past Surgical History:  Procedure Laterality Date  . APPENDECTOMY    . BREAST LUMPECTOMY    . COLONOSCOPY WITH PROPOFOL N/A 12/03/2019   Procedure: COLONOSCOPY WITH PROPOFOL;  Surgeon: Virgel Manifold, MD;  Location: [facility] ENDOSCOPY;  Service: Endoscopy;  Laterality: N/A;  . ESOPHAGOGASTRODUODENOSCOPY (EGD) WITH PROPOFOL N/A 12/03/2019   Procedure: ESOPHAGOGASTRODUODENOSCOPY (EGD) WITH PROPOFOL;  Surgeon: Virgel Manifold, MD;  Location: [facility] ENDOSCOPY;  Service: Endoscopy;  Laterality: N/A;  . FOOT SURGERY       OB History    Gravida  0   Para  0   Term  0   Preterm  0   AB  0   Living  0     SAB  0   IAB  0   Ectopic  0   Multiple  0   Live Births  0           Family History  Problem Relation Age of Onset  . Asthma Father   . Diabetes Other   . Heart failure Other   . Anxiety disorder Maternal Grandmother   . Depression Maternal Grandmother   . Crohn's disease Sister        half sister  . Diverticulitis Paternal  Grandmother   . Colon cancer Neg Hx   . Celiac disease Neg Hx     Social History   Tobacco Use  . Smoking status: Never Smoker  . Smokeless tobacco: Never Used  Vaping Use  . Vaping Use: Never used  Substance Use Topics  . Alcohol use: Yes    Comment: socially   . Drug use: No    Home Medications Prior to Admission medications   Medication Sig Start Date End Date Taking? Authorizing Provider  albuterol (PROVENTIL) (2.5 MG/3ML) 0.083% nebulizer solution Take 3 mLs (2.5 mg total) by nebulization every 6 (six) hours as needed for wheezing or shortness of breath. 02/11/20  Yes Malachy Mood, MD  Albuterol Sulfate (PROAIR RESPICLICK) 790 (90 Base) MCG/ACT AEPB Inhale 1-2 puffs into the lungs every 6 (six) hours  as needed (shortness of breath).   Yes [provider]  benzonatate (TESSALON) 100 MG capsule Take 2 capsules (200 mg total) by mouth every 8 (eight) hours. 09/06/20  Yes Jacqlyn Larsen, PA-C  buPROPion (WELLBUTRIN SR) 200 MG 12 hr tablet Take 1 tablet (200 mg total) by mouth 2 (two) times daily. Please take the second dose not later than 4 PM. 09/30/20 12/29/20 Yes Pucilowski, Olgierd A, MD  fluticasone (FLONASE) 50 MCG/ACT nasal spray Place 1 spray into both nostrils daily. 10/09/19  Yes Parrett, Tammy S, NP  lamoTRIgine (LAMICTAL) 200 MG tablet Take 1 tablet (200 mg total) by mouth at bedtime. 06/07/20 12/04/20 Yes Pucilowski, Olgierd A, MD  levocetirizine (XYZAL) 5 MG tablet TAKE 1 TABLET(5 MG) BY MOUTH EVERY EVENING 04/19/20  Yes Parrett, Tammy S, NP  LORazepam (ATIVAN) 2 MG tablet Take 1 tablet (2 mg total) by mouth every 8 (eight) hours as needed for anxiety. 08/27/20 08/27/21 Yes Pucilowski, Olgierd A, MD  lurasidone (LATUDA) 40 MG TABS tablet Take 1 tablet (40 mg total) by mouth at bedtime. TAKE 1 TABLET(40 MG) BY MOUTH DAILY WITH SUPPER 05/19/20 11/15/20 Yes Pucilowski, Olgierd A, MD  montelukast (SINGULAIR) 10 MG tablet Take 1 tablet (10 mg total) by mouth at bedtime. 10/29/19  Yes Collene Gobble, MD  pantoprazole (PROTONIX) 40 MG tablet TAKE 1 TABLET(40 MG) BY MOUTH TWICE DAILY 05/15/20  Yes Collene Gobble, MD  PARoxetine (PAXIL) 20 MG tablet Take 1 tablet (20 mg total) by mouth daily. TAKE 1 TABLET(20 MG) BY MOUTH DAILY AT 6 PM 05/19/20 11/15/20 Yes Pucilowski, Olgierd A, MD  traZODone (DESYREL) 50 MG tablet Take 1 tablet (50 mg total) by mouth at bedtime as needed for sleep. 08/30/20 11/28/20 Yes Pucilowski, Marchia Bond, MD    Allergies    Amoxicillin, Asa [aspirin], Cefoxitin, and Sulfa antibiotics  Review of Systems   Review of Systems  Constitutional: Positive for chills and fever.  HENT: Positive for congestion and rhinorrhea.   Respiratory: Positive for cough and shortness of breath.    Cardiovascular: Positive for chest pain. Negative for leg swelling.  Gastrointestinal: Negative for abdominal pain, diarrhea, nausea and vomiting.  Genitourinary: Negative for dysuria.  Musculoskeletal: Positive for myalgias.  Neurological: Negative for dizziness, syncope and light-headedness.  All other systems reviewed and are negative.   Physical Exam Updated Vital Signs BP 127/87 (BP Location: Left Arm)   Pulse 81   Temp 97.8 F (36.6 C) (Oral)   Resp 18   Ht 5\' 2"  (1.575 m)   Wt 90.7 kg   SpO2 99%   BMI 36.58 kg/m   Physical Exam Vitals and nursing note reviewed.  Constitutional:      General: She is not in acute distress.    Appearance: She is well-developed. She is not diaphoretic.     Comments: Patient is alert, patient feels poorly but is in no acute distress.  HENT:     Head: Normocephalic and atraumatic.     Nose: Congestion and rhinorrhea present.     Mouth/Throat:     Mouth: Mucous membranes are moist.     Pharynx: Oropharynx is clear.  Eyes:     General:        Right eye: No discharge.        Left eye: No discharge.     Extraocular Movements: Extraocular movements intact.     Pupils: Pupils are equal, round, and reactive to light.  Cardiovascular:     Rate and Rhythm: Normal rate and regular rhythm.     Pulses: Normal pulses.          Radial pulses are 2+ on the right side and 2+ on the left side.     Heart sounds: Normal heart sounds. No murmur heard. No friction rub. No gallop.   Pulmonary:     Effort: Pulmonary effort is normal. No respiratory distress.     Breath sounds: Normal breath sounds. No wheezing or rales.     Comments: Respirations equal and unlabored, patient able to speak in full sentences, lungs clear to auscultation bilaterally  Abdominal:     General: Bowel sounds are normal. There is no distension.     Palpations: Abdomen is soft. There is no mass.     Tenderness: There is no abdominal tenderness. There is no guarding.      Comments: Abdomen soft, nondistended, nontender to palpation in all quadrants without guarding or peritoneal signs   Musculoskeletal:        General: No deformity.     Cervical back: Neck supple.     Right lower leg: No edema.     Left lower leg: No edema.  Skin:    General: Skin is warm and dry.     Capillary Refill: Capillary refill takes less than 2 seconds.  Neurological:     Mental Status: She is alert.     Coordination: Coordination normal.     Comments: Speech is clear, able to follow commands Moves extremities without ataxia, coordination intact  Psychiatric:        Mood and Affect: Mood normal.        Behavior: Behavior normal.     ED Results / Procedures / Treatments   Labs (all labs ordered are listed, but only abnormal results are displayed) Labs Reviewed  RESP PANEL BY RT-PCR (FLU A&B, COVID) ARPGX2 - Abnormal; Notable for the following components:      Result Value   SARS Coronavirus 2 by RT PCR POSITIVE (*)    All other components within normal limits  BASIC METABOLIC PANEL - Abnormal; Notable for the following components:   Calcium 8.8 (*)    All other components within normal limits  CBC WITH DIFFERENTIAL/PLATELET  D-DIMER, QUANTITATIVE  HCG, SERUM, QUALITATIVE  TROPONIN I (HIGH SENSITIVITY)  TROPONIN I (HIGH SENSITIVITY)    EKG EKG Interpretation  Date/Time:  Monday September 06 2020 10:01:24 EST Ventricular Rate:  79 PR Interval:    QRS Duration: 88 QT Interval:  383 QTC Calculation: 439 R Axis:   49 Text Interpretation: Sinus rhythm No STEMI Confirmed by Octaviano Glow [phone]) on 09/06/2020 10:22:19 AM   Radiology DG Chest  Port 1 View  Result Date: 09/06/2020 CLINICAL DATA:  Cough, chest pain EXAM: PORTABLE CHEST 1 VIEW COMPARISON:  01/11/2019 FINDINGS: The heart size and mediastinal contours are within normal limits. No focal airspace consolidation, pleural effusion, or pneumothorax. The visualized skeletal structures are unremarkable. IMPRESSION:  No active disease. Electronically Signed   By: Davina Poke D.O.   On: 09/06/2020 10:25    Procedures Procedures   Medications Ordered in ED Medications  albuterol (VENTOLIN HFA) 108 (90 Base) MCG/ACT inhaler 2 puff (2 puffs Inhalation Given 09/06/20 1022)    ED Course  I have reviewed the triage vital signs and the nursing notes.  Pertinent labs & imaging results that were available during my care of the patient were reviewed by me and considered in my medical decision making (see chart for details).    MDM Rules/Calculators/A&P                          30 year old female presents with cough, subjective fevers and chills with associated chest pain and shortness of breath, symptoms for 3 days.  Patient is unvaccinated for Covid, but denies sick contacts had negative home Covid test when symptoms began.  Worsening chest pain and shortness of breath today prompting her evaluation, does not feel similar to prior asthma exacerbations.  On auscultation lungs are clear although slightly diminished.  No lower extremity swelling.  Chest pain not reproducible, but worse with cough and deep breath.  Concern for potential Covid infection, but will also evaluate with basic lab work, troponin, D-dimer, EKG and chest x-ray.  I have independently ordered, reviewed and interpreted all labs and imaging:   EKG: Sinus rhythm with S1Q3T3  Chest x-ray with no active cardiopulmonary disease, no signs of pneumonia.  CBC: No leukocytosis and normal hemoglobin BMP: Calcium of 8.8 but no other electrolyte derangements, normal renal function Troponin: Negative x 2 D-dimer: Negative fortunately, no concern for PE, no need for CT angio. Pregnancy: Negative  Covid test is positive, this explains patient's symptoms although fortunately symptoms seem to be mild at this time.  She ambulated and maintained normal O2 sats without increased work of breathing.  Will treat supportively with albuterol, cough medications  and encouraged ibuprofen and Tylenol.  Patient is a candidate for Paxlovid and I discussed this medication with her, but unfortunately it interacts with her Latuda, and this medication works well to manage patient's bipolar disorder symptoms, do not want to affect the extra concentrations, so we will hold off on starting this medication and continue with supportive treatment and close monitoring of symptoms.  Return precautions discussed.  Discharged home in good condition  Nicole Rodriguez was evaluated in Emergency Department on 09/10/2020 for the symptoms described in the history of present illness. She was evaluated in the context of the global COVID-19 pandemic, which necessitated consideration that the patient might be at risk for infection with the SARS-CoV-2 virus that causes COVID-19. Institutional protocols and algorithms that pertain to the evaluation of patients at risk for COVID-19 are in a state of rapid change based on information released by regulatory bodies including the CDC and federal and state organizations. These policies and algorithms were followed during the patient's care in the ED.   Final Clinical Impression(s) / ED Diagnoses Final diagnoses:  COVID-19 virus infection  Shortness of breath    Rx / DC Orders ED Discharge Orders         Ordered    benzonatate (  TESSALON) 100 MG capsule  Every 8 hours        09/06/20 1455           Jacqlyn Larsen, [place] 09/10/20 9234    Wyvonnia Dusky, MD 09/14/20 1407

## 2020-09-07 ENCOUNTER — Telehealth: Payer: Self-pay | Admitting: Nurse Practitioner

## 2020-09-07 ENCOUNTER — Telehealth: Payer: Self-pay

## 2020-09-07 NOTE — Telephone Encounter (Signed)
Call Pt left Vm following up on resent ED visit

## 2020-09-07 NOTE — Telephone Encounter (Signed)
Called to discuss with patient about COVID-19 symptoms and the use of one of the available treatments for those with mild to moderate Covid symptoms and at a high risk of hospitalization.  Pt appears to qualify for outpatient treatment due to co-morbid conditions and/or a member of an at-risk group in accordance with the FDA Emergency Use Authorization.    Symptom onset: Unknown Vaccinated: Unknown Booster? Unknown Immunocompromised? No Qualifiers: Asthma  Unable to reach pt - Left message and call back number [phone].  Marcello Moores

## 2020-09-14 ENCOUNTER — Telehealth (HOSPITAL_COMMUNITY): Payer: Self-pay

## 2020-09-14 NOTE — Telephone Encounter (Signed)
Received FMLA paperwork for patient. I have called her several times to get more information but she isn't responding to my messages. I asked her to call me back so I could speak with her regarding the paperwork. It states in the notes from her last visit with the doctor that she can return to work if she feels ready; however, she's been in the ED since her last visit so I LVM TO CALL ME BACK

## 2020-09-29 ENCOUNTER — Other Ambulatory Visit: Payer: Self-pay

## 2020-09-29 ENCOUNTER — Telehealth (INDEPENDENT_AMBULATORY_CARE_PROVIDER_SITE_OTHER): Payer: 59 | Admitting: Psychiatry

## 2020-09-29 ENCOUNTER — Encounter (HOSPITAL_COMMUNITY): Payer: Self-pay

## 2020-09-29 DIAGNOSIS — F4010 Social phobia, unspecified: Secondary | ICD-10-CM

## 2020-09-29 DIAGNOSIS — F410 Panic disorder [episodic paroxysmal anxiety] without agoraphobia: Secondary | ICD-10-CM | POA: Diagnosis not present

## 2020-09-29 DIAGNOSIS — F3181 Bipolar II disorder: Secondary | ICD-10-CM | POA: Diagnosis not present

## 2020-09-29 DIAGNOSIS — F411 Generalized anxiety disorder: Secondary | ICD-10-CM | POA: Diagnosis not present

## 2020-09-29 MED ORDER — CLONAZEPAM 1 MG PO TABS: 1.0000 mg | ORAL_TABLET | Freq: Two times a day (BID) | ORAL | 1 refills | Status: DC
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
## 2020-10-12 ENCOUNTER — Telehealth (HOSPITAL_COMMUNITY): Payer: 59 | Admitting: Family

## 2020-10-19 ENCOUNTER — Telehealth (INDEPENDENT_AMBULATORY_CARE_PROVIDER_SITE_OTHER): Payer: 59 | Admitting: Psychiatry

## 2020-10-19 ENCOUNTER — Telehealth (HOSPITAL_COMMUNITY): Payer: Self-pay

## 2020-10-19 ENCOUNTER — Other Ambulatory Visit: Payer: Self-pay

## 2020-10-19 DIAGNOSIS — F3181 Bipolar II disorder: Secondary | ICD-10-CM

## 2020-10-19 DIAGNOSIS — F411 Generalized anxiety disorder: Secondary | ICD-10-CM | POA: Diagnosis not present
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
## 2020-10-19 NOTE — Telephone Encounter (Signed)
Patient does not want to see Dr. Einar Grad again. Felt like provider was disrespectful and is taking her off her medications that have been working.  States it took a year to get her medication right and now to be taken off the medications.  Provider also wants her to go back to work and this causes the anxiety.  Had a "full blown panic attack" after the visit.

## 2020-10-20 ENCOUNTER — Telehealth (HOSPITAL_COMMUNITY): Payer: Self-pay | Admitting: Psychiatry

## 2020-10-25 ENCOUNTER — Telehealth (HOSPITAL_COMMUNITY): Payer: 59 | Admitting: Psychiatry

## 2020-10-25 ENCOUNTER — Encounter (HOSPITAL_COMMUNITY): Payer: Self-pay | Admitting: Psychiatry

## 2020-10-25 MED ORDER — LURASIDONE HCL 40 MG PO TABS: 40.0000 mg | ORAL_TABLET | Freq: Every evening | ORAL | 1 refills | Status: DC

## 2020-10-27 ENCOUNTER — Encounter (HOSPITAL_COMMUNITY): Payer: Self-pay

## 2020-10-31 ENCOUNTER — Other Ambulatory Visit: Payer: Self-pay | Admitting: Adult Health

## 2020-11-01 ENCOUNTER — Encounter: Payer: Self-pay | Admitting: Internal Medicine

## 2020-11-02 ENCOUNTER — Other Ambulatory Visit: Payer: Self-pay | Admitting: *Deleted

## 2020-11-02 MED ORDER — LEVOCETIRIZINE DIHYDROCHLORIDE 5 MG PO TABS: ORAL_TABLET | ORAL | 5 refills | Status: DC

## 2020-11-02 MED ORDER — MONTELUKAST SODIUM 10 MG PO TABS: 10.0000 mg | ORAL_TABLET | Freq: Every day | ORAL | 5 refills | Status: DC

## 2020-11-05 ENCOUNTER — Ambulatory Visit: Payer: Self-pay | Admitting: Adult Health

## 2020-11-15 ENCOUNTER — Encounter: Payer: Self-pay | Admitting: Internal Medicine

## 2020-11-15 ENCOUNTER — Other Ambulatory Visit: Payer: Self-pay

## 2020-11-15 ENCOUNTER — Telehealth (INDEPENDENT_AMBULATORY_CARE_PROVIDER_SITE_OTHER): Payer: 59 | Admitting: Internal Medicine

## 2020-11-15 DIAGNOSIS — J069 Acute upper respiratory infection, unspecified: Secondary | ICD-10-CM | POA: Diagnosis not present

## 2020-11-15 MED ORDER — SALINE SPRAY 0.65 % NA SOLN: 1.0000 | NASAL | 0 refills | Status: DC | PRN

## 2020-11-15 MED ORDER — AZITHROMYCIN 250 MG PO TABS
ORAL_TABLET | ORAL | 0 refills | Status: AC
Start: 2020-11-15 — End: 2020-11-20

## 2020-11-15 NOTE — Patient Instructions (Signed)

## 2020-11-15 NOTE — Telephone Encounter (Signed)
Pt made virtual appt with patel 11-15-20

## 2020-11-15 NOTE — Progress Notes (Signed)
Virtual Visit via Telephone Note   This visit type was conducted due to national recommendations for restrictions regarding the COVID-19 Pandemic (e.g. social distancing) in an effort to limit this patient's exposure and mitigate transmission in our community.  Due to her co-morbid illnesses, this patient is at least at moderate risk for complications without adequate follow up.  This format is felt to be most appropriate for this patient at this time.  The patient did not have access to video technology/had technical difficulties with video requiring transitioning to audio format only (telephone).  All issues noted in this document were discussed and addressed.  No physical exam could be performed with this format.  Evaluation Performed:  Follow-up visit  Date:  11/15/2020   ID:  Nicole Rodriguez, DOB October 04, 1990, MRN [id number]  Patient Location: Home Provider Location: Office/Clinic  Participants: Patient Location of Patient: Home Location of Provider: Telehealth Consent was obtain for visit to be over via telehealth. I verified that I am speaking with the correct person using two identifiers.  PCP:  Lindell Spar, MD   Chief Complaint:  Cough, nasal congestion and fever  History of Present Illness:    Nicole Rodriguez is a 30 y.o. female who has a televisit for c/o nasal congestion, cough and sinus pain/pressure for last 5 days. She had fever for 2 days. She had COVID infection about 2 months ago. Denies any dyspnea or wheezing. She has been taking Tylenol and OTC allergy medication with minimal relief.  The patient does not have symptoms concerning for COVID-19 infection (fever, chills, cough, or new shortness of breath).   Past Medical, Surgical, Social History, Allergies, and Medications have been Reviewed.  Past Medical History:  Diagnosis Date  . Asthma   . Bipolar disorder ([place])   . Generalized anxiety disorder   . GERD (gastroesophageal reflux disease)    Phreesia  08/17/2020  . IBS (irritable bowel syndrome)   . Plantar fasciitis 10/15/2018  . Rectal bleeding 09/24/2019  . Social anxiety disorder    Past Surgical History:  Procedure Laterality Date  . APPENDECTOMY    . BREAST LUMPECTOMY    . COLONOSCOPY WITH PROPOFOL N/A 12/03/2019   Procedure: COLONOSCOPY WITH PROPOFOL;  Surgeon: Virgel Manifold, MD;  Location: [facility] ENDOSCOPY;  Service: Endoscopy;  Laterality: N/A;  . ESOPHAGOGASTRODUODENOSCOPY (EGD) WITH PROPOFOL N/A 12/03/2019   Procedure: ESOPHAGOGASTRODUODENOSCOPY (EGD) WITH PROPOFOL;  Surgeon: Virgel Manifold, MD;  Location: [facility] ENDOSCOPY;  Service: Endoscopy;  Laterality: N/A;  . FOOT SURGERY       Current Meds  Medication Sig  . albuterol (PROVENTIL) (2.5 MG/3ML) 0.083% nebulizer solution Take 3 mLs (2.5 mg total) by nebulization every 6 (six) hours as needed for wheezing or shortness of breath.  . Albuterol Sulfate (PROAIR RESPICLICK) 962 (90 Base) MCG/ACT AEPB Inhale 1-2 puffs into the lungs every 6 (six) hours as needed (shortness of breath).  . benzonatate (TESSALON) 100 MG capsule Take 2 capsules (200 mg total) by mouth every 8 (eight) hours.  Marland Kitchen buPROPion (WELLBUTRIN SR) 200 MG 12 hr tablet Take 1 tablet (200 mg total) by mouth 2 (two) times daily. Please take the second dose not later than 4 PM.  . clonazePAM (KLONOPIN) 1 MG tablet Take 1 tablet (1 mg total) by mouth 2 (two) times daily.  . fluticasone (FLONASE) 50 MCG/ACT nasal spray Place 1 spray into both nostrils daily.  Marland Kitchen lamoTRIgine (LAMICTAL) 200 MG tablet Take 1 tablet (200 mg total) by mouth at  bedtime.  Marland Kitchen levocetirizine (XYZAL) 5 MG tablet TAKE 1 TABLET(5 MG) BY MOUTH EVERY EVENING  . lurasidone (LATUDA) 40 MG TABS tablet Take 1 tablet (40 mg total) by mouth at bedtime. TAKE 1 TABLET(40 MG) BY MOUTH DAILY WITH SUPPER  . montelukast (SINGULAIR) 10 MG tablet Take 1 tablet (10 mg total) by mouth at bedtime.  . pantoprazole (PROTONIX) 40 MG tablet TAKE 1 TABLET(40 MG) BY  MOUTH TWICE DAILY  . PARoxetine (PAXIL) 20 MG tablet Take 1 tablet (20 mg total) by mouth daily. TAKE 1 TABLET(20 MG) BY MOUTH DAILY AT 6 PM  . traZODone (DESYREL) 50 MG tablet Take 1 tablet (50 mg total) by mouth at bedtime as needed for sleep.  Marland Kitchen azithromycin (ZITHROMAX) 250 MG tablet Take 2 tablets on day 1, then 1 tablet daily on days 2 through 5  . sodium chloride (OCEAN) 0.65 % SOLN nasal spray Place 1 spray into both nostrils as needed for congestion.     Allergies:   Amoxicillin, Asa [aspirin], Cefoxitin, and Sulfa antibiotics   ROS:   Please see the history of present illness.     All other systems reviewed and are negative.   Labs/Other Tests and Data Reviewed:    Recent Labs: 09/06/2020: BUN 9; Creatinine, Ser 0.87; Hemoglobin 13.2; Platelets 305; Potassium 3.8; Sodium 136   Recent Lipid Panel Lab Results  Component Value Date/Time   CHOL 159 02/01/2017 12:00 AM   TRIG 85 02/01/2017 12:00 AM   HDL 44 02/01/2017 12:00 AM   CHOLHDL 3.6 02/01/2017 12:00 AM   LDLCALC 98 02/01/2017 12:00 AM    Wt Readings from Last 3 Encounters:  09/06/20 200 lb (90.7 kg)  08/20/20 203 lb 1.9 oz (92.1 kg)  12/16/19 167 lb 9.6 oz (76 kg)      ASSESSMENT & PLAN:    URTI Acute sinusitis Persistent symptoms despite symptomatic treatment Started Azithromycin as patient has allergy to Amoxicillin Nasal saline spray PRN Mucinex/Robitussin PRN for cough Less likely to be COVID as she had it 2 months ago.  Time:   Today, I have spent 8 minutes reviewing the chart, including problem list, medications, and with the patient with telehealth technology discussing the above problems.   Medication Adjustments/Labs and Tests Ordered: Current medicines are reviewed at length with the patient today.  Concerns regarding medicines are outlined above.   Tests Ordered: No orders of the defined types were placed in this encounter.   Medication Changes: Meds ordered this encounter  Medications   . azithromycin (ZITHROMAX) 250 MG tablet    Sig: Take 2 tablets on day 1, then 1 tablet daily on days 2 through 5    Dispense:  6 tablet    Refill:  0  . sodium chloride (OCEAN) 0.65 % SOLN nasal spray    Sig: Place 1 spray into both nostrils as needed for congestion.    Dispense:  15 mL    Refill:  0     Note: This dictation was prepared with Dragon dictation along with smaller phrase technology. Similar sounding words can be transcribed inadequately or may not be corrected upon review. Any transcriptional errors that result from this process are unintentional.      Disposition:  Follow up  Signed, Lindell Spar, MD  11/15/2020 9:13 AM     [facility]

## 2020-11-23 ENCOUNTER — Ambulatory Visit (HOSPITAL_COMMUNITY): Payer: 59 | Admitting: Psychiatry

## 2020-11-23 NOTE — Telephone Encounter (Signed)
Opened in error

## 2020-12-03 ENCOUNTER — Other Ambulatory Visit: Payer: Self-pay

## 2020-12-03 ENCOUNTER — Ambulatory Visit (INDEPENDENT_AMBULATORY_CARE_PROVIDER_SITE_OTHER): Payer: 59 | Admitting: Psychiatry

## 2020-12-03 ENCOUNTER — Encounter (HOSPITAL_COMMUNITY): Payer: Self-pay | Admitting: Psychiatry

## 2020-12-03 ENCOUNTER — Encounter (HOSPITAL_COMMUNITY): Payer: Self-pay

## 2020-12-03 VITALS — BP 141/86 | HR 84 | Resp 18 | Ht 63.0 in | Wt 207.6 lb

## 2020-12-03 DIAGNOSIS — F3110 Bipolar disorder, current episode manic without psychotic features, unspecified: Secondary | ICD-10-CM

## 2020-12-03 MED ORDER — PAROXETINE HCL 20 MG PO TABS: 20.0000 mg | ORAL_TABLET | Freq: Every day | ORAL | 1 refills | Status: DC

## 2020-12-03 MED ORDER — LURASIDONE HCL 40 MG PO TABS: 40.0000 mg | ORAL_TABLET | Freq: Every evening | ORAL | 1 refills | Status: DC

## 2020-12-03 MED ORDER — LAMOTRIGINE 200 MG PO TABS: 200.0000 mg | ORAL_TABLET | Freq: Every evening | ORAL | 1 refills | Status: DC

## 2020-12-03 MED ORDER — TRAZODONE HCL 50 MG PO TABS
50.0000 mg | ORAL_TABLET | Freq: Every evening | ORAL | 2 refills | Status: DC | PRN
Start: 2020-12-03 — End: 2021-02-02

## 2020-12-03 MED ORDER — BUPROPION HCL ER (SR) 200 MG PO TB12: 200.0000 mg | ORAL_TABLET | Freq: Two times a day (BID) | ORAL | 2 refills | Status: DC

## 2020-12-03 MED ORDER — CLONAZEPAM 1 MG PO TABS: 1.0000 mg | ORAL_TABLET | Freq: Two times a day (BID) | ORAL | 2 refills | Status: DC

## 2020-12-03 NOTE — Progress Notes (Signed)
[facility] MD/PA/NP OP Progress Note  12/03/2020 11:07 AM Nicole Rodriguez  MRN:  [id number] Interview was conducted by phone and I verified that I was speaking with the correct person using two identifiers. I discussed the limitations of evaluation and management by telemedicine and  the availability of in person appointments. Patient expressed understanding and agreed to proceed. Participants in the visit: patient (location - home); physician (location - home office).  Chief Complaint: Panic attacks.   This patient is a 30 year old white married gay female who has a 15-year-old daughter.  She is happily married to her partner for a number of years.  She was diagnosed and treated in this setting for bipolar type II.  Patient was in the IOP program approximately a year ago.  That was her daily illness when she had persistent daily depression which was then followed by what appeared to be somewhat of a manic episode.  Her presentation in 2021 when she was admitted to the partial program was characterized by disturbances in sleep appetite and energy.  She had suicidal considerations but no attempts.  At this time she is actually doing very well.  She is ready to go back to work.  She denies daily depression.  She has no vegetative symptoms.  She denies being suicidal.  She enjoys playing with her daughter in the pool.  She likes watching TV.  Patient is on multiple psychotropic medications.  He has been married for 7 years and a good relationship.  She feels like she can communicate and negotiate well.  The patient works at [employer].  She likes what she does.  She likes where she works and what she does.  The patient has no medical illnesses at this time.  She has never been a patient in a psychiatric hospital.  She has been in therapy in the past but not now.  The patient denies symptoms of generalized anxiety disorder or panic disorder or obsessive-compulsive disorder.  At this time she is doing well enough  that she is been out of work for over a month and is ready to return.  Her episode of mania was characterized by feeling intensely persistently irritable.  She had a decreased need to sleep.  Her energy level was increased.  Today was a shortened focused visit.  At this time the patient is functioning well and is very stable. Visit Diagnosis:  No diagnosis found.  Past Psychiatric History: Please see intake H&P.  Past Medical History:  Past Medical History:  Diagnosis Date  . Asthma   . Bipolar disorder ([place])   . Generalized anxiety disorder   . GERD (gastroesophageal reflux disease)    Phreesia 08/17/2020  . IBS (irritable bowel syndrome)   . Plantar fasciitis 10/15/2018  . Rectal bleeding 09/24/2019  . Social anxiety disorder     Past Surgical History:  Procedure Laterality Date  . APPENDECTOMY    . BREAST LUMPECTOMY    . COLONOSCOPY WITH PROPOFOL N/A 12/03/2019   Procedure: COLONOSCOPY WITH PROPOFOL;  Surgeon: Virgel Manifold, MD;  Location: [facility] ENDOSCOPY;  Service: Endoscopy;  Laterality: N/A;  . ESOPHAGOGASTRODUODENOSCOPY (EGD) WITH PROPOFOL N/A 12/03/2019   Procedure: ESOPHAGOGASTRODUODENOSCOPY (EGD) WITH PROPOFOL;  Surgeon: Virgel Manifold, MD;  Location: [facility] ENDOSCOPY;  Service: Endoscopy;  Laterality: N/A;  . FOOT SURGERY      Family Psychiatric History: Reviewed.  Family History:  Family History  Problem Relation Age of Onset  . Asthma Father   . Diabetes Other   .  Heart failure Other   . Anxiety disorder Maternal Grandmother   . Depression Maternal Grandmother   . Crohn's disease Sister        half sister  . Diverticulitis Paternal Grandmother   . Colon cancer Neg Hx   . Celiac disease Neg Hx     Social History:  Social History   Socioeconomic History  . Marital status: Married    Spouse name: Not on file  . Number of children: 1  . Years of education: Not on file  . Highest education level: Not on file  Occupational History  . Not on file   Tobacco Use  . Smoking status: Never Smoker  . Smokeless tobacco: Never Used  Vaping Use  . Vaping Use: Never used  Substance and Sexual Activity  . Alcohol use: Yes    Comment: socially   . Drug use: No  . Sexual activity: Yes    Birth control/protection: None  Other Topics Concern  . Not on file  Social History Narrative   Daughter 81 years old.   Social Determinants of Health   Financial Resource Strain: Not on file  Food Insecurity: Not on file  Transportation Needs: Not on file  Physical Activity: Not on file  Stress: Not on file  Social Connections: Not on file    Allergies:  Allergies  Allergen Reactions  . Amoxicillin Hives  . Asa [Aspirin] Hives  . Cefoxitin Itching and Rash  . Sulfa Antibiotics Hives    Metabolic Disorder Labs: Lab Results  Component Value Date   HGBA1C 5.2 02/01/2017   No results found for: PROLACTIN Lab Results  Component Value Date   CHOL 159 02/01/2017   TRIG 85 02/01/2017   HDL 44 02/01/2017   CHOLHDL 3.6 02/01/2017   LDLCALC 98 02/01/2017   LDLCALC 112 (H) 09/14/2016   Lab Results  Component Value Date   TSH 1.500 09/14/2016    Therapeutic Level Labs: No results found for: LITHIUM No results found for: VALPROATE No components found for:  CBMZ  Current Medications: Current Outpatient Medications  Medication Sig Dispense Refill  . albuterol (PROVENTIL) (2.5 MG/3ML) 0.083% nebulizer solution Take 3 mLs (2.5 mg total) by nebulization every 6 (six) hours as needed for wheezing or shortness of breath. 75 mL 3  . Albuterol Sulfate (PROAIR RESPICLICK) 841 (90 Base) MCG/ACT AEPB Inhale 1-2 puffs into the lungs every 6 (six) hours as needed (shortness of breath).    . benzonatate (TESSALON) 100 MG capsule Take 2 capsules (200 mg total) by mouth every 8 (eight) hours. 21 capsule 0  . buPROPion (WELLBUTRIN SR) 200 MG 12 hr tablet Take 1 tablet (200 mg total) by mouth 2 (two) times daily. Please take the second dose not later than  4 PM. 60 tablet 2  . clonazePAM (KLONOPIN) 1 MG tablet Take 1 tablet (1 mg total) by mouth 2 (two) times daily. 60 tablet 2  . fluticasone (FLONASE) 50 MCG/ACT nasal spray Place 1 spray into both nostrils daily. 16 g 5  . lamoTRIgine (LAMICTAL) 200 MG tablet Take 1 tablet (200 mg total) by mouth at bedtime. 90 tablet 1  . levocetirizine (XYZAL) 5 MG tablet TAKE 1 TABLET(5 MG) BY MOUTH EVERY EVENING 30 tablet 5  . lurasidone (LATUDA) 40 MG TABS tablet Take 1 tablet (40 mg total) by mouth at bedtime. TAKE 1 TABLET(40 MG) BY MOUTH DAILY WITH SUPPER 90 tablet 1  . montelukast (SINGULAIR) 10 MG tablet Take 1 tablet (10 mg total)  by mouth at bedtime. 30 tablet 5  . pantoprazole (PROTONIX) 40 MG tablet TAKE 1 TABLET(40 MG) BY MOUTH TWICE DAILY 60 tablet 5  . PARoxetine (PAXIL) 20 MG tablet Take 1 tablet (20 mg total) by mouth daily. TAKE 1 TABLET(20 MG) BY MOUTH DAILY AT 6 PM 90 tablet 1  . sodium chloride (OCEAN) 0.65 % SOLN nasal spray Place 1 spray into both nostrils as needed for congestion. 15 mL 0  . traZODone (DESYREL) 50 MG tablet Take 1 tablet (50 mg total) by mouth at bedtime as needed for sleep. 30 tablet 2   No current facility-administered medications for this visit.      Psychiatric Specialty Exam: Review of Systems  Psychiatric/Behavioral: The patient is nervous/anxious.   All other systems reviewed and are negative.   There were no vitals taken for this visit.There is no height or weight on file to calculate BMI.  General Appearance: NA  Eye Contact:  NA  Speech:  Clear and Coherent and Normal Rate  Volume:  Normal  Mood:  Anxious  Affect:  NA  Thought Process:  Goal Directed  Orientation:  Full (Time, Place, and Person)  Thought Content: Rumination   Suicidal Thoughts:  No  Homicidal Thoughts:  No  Memory:  Immediate;   Good Recent;   Good Remote;   Good  Judgement:  Good  Insight:  Fair  Psychomotor Activity:  NA  Concentration:  Concentration: Fair  Recall:  Good   Fund of Knowledge: Good  Language: Good  Akathisia:  Negative  Handed:  Right  AIMS (if indicated): not done  Assets:  Communication Skills Desire for Improvement Financial Resources/Insurance Housing Social Support Talents/Skills  ADL's:  Intact  Cognition: WNL  Sleep:  Good   Screenings: GAD-7   Flowsheet Row Office Visit from 06/18/2019 in [facility] Office Visit from 06/05/2019 in [facility] Office Visit from 04/01/2019 in [facility]  Total GAD-7 Score 20 19 0    PHQ2-9   Flowsheet Row Video Visit from 11/15/2020 in [facility] Office Visit from 08/20/2020 in [facility] Office Visit from 06/18/2019 in [facility] Office Visit from 06/05/2019 in [facility] Office Visit from 04/01/2019 in [facility]  PHQ-2 Total Score 0 0 5 6 0  PHQ-9 Total Score -- 0 20 19 3     Flowsheet Row ED from 09/06/2020 in [facility] No Risk       Assessment and Plan:  This patient's diagnosis is that of bipolar disorder.  The exact form of a bipolar disorder will be discussed in further diagnostic sessions.  For now she will continue the multiple psychotropic medications she is taking.  She will continue Lamictal 200 mg, Latuda 40 mg, Paxil 20 mg and Klonopin 1 mg twice daily.  She also takes Wellbutrin which will be continued.  At this time we will continue these medicines and will write her to get back to work starting tomorrow.  My plan is to first to get rid of her Paxil which has led to a great deal of weight gain.  Also she has a bipolar disorder and would make good sense to avoid any of the antidepressants as they are unlikely helpful.  We will reevaluate all her medications on her next visit.  HY:[id number] 2 disorder rapid cycling; Mixed anxiety disorder (panic disorder' GAD, social anxiety)  Plan:ContinueLamictal to200 mg at HS,Latuda 40 mg at  HS,paroxetine 20  mg in PM,trazodone 50 mg prn sleepandbupropion SR 200 mg bid for fatigue. I will stop lorazepam 2 mg prn anxiety (up to tid) and start clonazepam 1 mg bid scheduled initially (then advised to start using it prn after anxiety decreases). We shall write her an excuse from work note for 3/26-4/18/22 period.  Next appointment witha new provider in 6month.The plan was discussed with patient who had an opportunity to ask questions and these were all answered. I spend70minutes inphone consultationwith the patient.    Jerral Ralph, MD 12/03/2020, 11:07 AM

## 2020-12-20 ENCOUNTER — Ambulatory Visit: Payer: 59 | Admitting: Internal Medicine

## 2020-12-28 ENCOUNTER — Ambulatory Visit (HOSPITAL_COMMUNITY): Payer: 59 | Admitting: Psychiatry

## 2021-01-19 ENCOUNTER — Other Ambulatory Visit: Payer: Self-pay | Admitting: Emergency Medicine

## 2021-01-24 ENCOUNTER — Other Ambulatory Visit (HOSPITAL_COMMUNITY): Payer: Self-pay | Admitting: *Deleted

## 2021-01-24 MED ORDER — LURASIDONE HCL 40 MG PO TABS: 40.0000 mg | ORAL_TABLET | Freq: Every evening | ORAL | 0 refills | Status: DC

## 2021-02-02 ENCOUNTER — Ambulatory Visit (INDEPENDENT_AMBULATORY_CARE_PROVIDER_SITE_OTHER): Payer: Self-pay | Admitting: Psychiatry

## 2021-02-02 ENCOUNTER — Other Ambulatory Visit: Payer: Self-pay

## 2021-02-02 DIAGNOSIS — F3130 Bipolar disorder, current episode depressed, mild or moderate severity, unspecified: Secondary | ICD-10-CM

## 2021-02-02 MED ORDER — CLONAZEPAM 1 MG PO TABS: ORAL_TABLET | ORAL | 4 refills | Status: DC

## 2021-02-02 MED ORDER — BUPROPION HCL ER (SR) 200 MG PO TB12: 200.0000 mg | ORAL_TABLET | Freq: Two times a day (BID) | ORAL | 2 refills | Status: DC

## 2021-02-02 MED ORDER — TRAZODONE HCL 50 MG PO TABS
50.0000 mg | ORAL_TABLET | Freq: Every evening | ORAL | 2 refills | Status: DC | PRN
Start: 2021-02-02 — End: 2021-08-02

## 2021-02-02 MED ORDER — LURASIDONE HCL 40 MG PO TABS: 40.0000 mg | ORAL_TABLET | Freq: Every evening | ORAL | 0 refills | Status: DC

## 2021-02-02 MED ORDER — LAMOTRIGINE 200 MG PO TABS: 200.0000 mg | ORAL_TABLET | Freq: Every evening | ORAL | 1 refills | Status: DC

## 2021-02-02 NOTE — Progress Notes (Signed)
[facility] MD/PA/NP OP Progress Note  02/02/2021 3:06 PM Jannete Golob  MRN:  [id number] Interview was conducted by phone and I verified that I was speaking with the correct person using two identifiers. I discussed the limitations of evaluation and management by telemedicine and  the availability of in person appointments. Patient expressed understanding and agreed to proceed. Participants in the visit: patient (location - home); physician (location - home office).  Chief Complaint: Panic attacks.   Today the patient is actually doing quite well.  Today is her birthday.  The patient change her job and now she is a Dance movement psychotherapist at [employer].  Note is the patient has never had vaccinations for COVID.  The patient feels healthy.  She denies any signs of infection.  Her partner's name is [name] and her are getting along great.  The patient has a 15-year-old daughter who is doing well.  Patient still spends a lot of time in the pool but she is happily working with a job she really likes.  She likes to boss and she likes the people she works with.  Her partner is also got a new job.  They have a more income and they feel good about it.  The patient denies depression.  She denies anhedonia.  She denies irritability or euphoria.  She is sleeping fairly well.  Her appetite is good.  She is got good energy.  She is not grandiose.  She is getting along well with the world.  She is functioning well.  Her health is good.  She has no physical complaints. Visit Diagnosis:  No diagnosis found.  Past Psychiatric History: Please see intake H&P.  Past Medical History:  Past Medical History:  Diagnosis Date   Asthma    Bipolar disorder ([place])    Generalized anxiety disorder    GERD (gastroesophageal reflux disease)    Phreesia 08/17/2020   IBS (irritable bowel syndrome)    Plantar fasciitis 10/15/2018   Rectal bleeding 09/24/2019   Social anxiety disorder     Past Surgical History:  Procedure Laterality Date    APPENDECTOMY     BREAST LUMPECTOMY     COLONOSCOPY WITH PROPOFOL N/A 12/03/2019   Procedure: COLONOSCOPY WITH PROPOFOL;  Surgeon: Virgel Manifold, MD;  Location: [facility] ENDOSCOPY;  Service: Endoscopy;  Laterality: N/A;   ESOPHAGOGASTRODUODENOSCOPY (EGD) WITH PROPOFOL N/A 12/03/2019   Procedure: ESOPHAGOGASTRODUODENOSCOPY (EGD) WITH PROPOFOL;  Surgeon: Virgel Manifold, MD;  Location: [facility] ENDOSCOPY;  Service: Endoscopy;  Laterality: N/A;   FOOT SURGERY      Family Psychiatric History: Reviewed.  Family History:  Family History  Problem Relation Age of Onset   Asthma Father    Diabetes Other    Heart failure Other    Anxiety disorder Maternal Grandmother    Depression Maternal Grandmother    Crohn's disease Sister        half sister   Diverticulitis Paternal Grandmother    Colon cancer Neg Hx    Celiac disease Neg Hx     Social History:  Social History   Socioeconomic History   Marital status: Married    Spouse name: Not on file   Number of children: 1   Years of education: Not on file   Highest education level: Not on file  Occupational History   Not on file  Tobacco Use   Smoking status: Never   Smokeless tobacco: Never  Vaping Use   Vaping Use: Never used  Substance and Sexual Activity  Alcohol use: Yes    Comment: socially    Drug use: No   Sexual activity: Yes    Birth control/protection: None  Other Topics Concern   Not on file  Social History Narrative   Daughter 13 years old.   Social Determinants of Health   Financial Resource Strain: Not on file  Food Insecurity: Not on file  Transportation Needs: Not on file  Physical Activity: Not on file  Stress: Not on file  Social Connections: Not on file    Allergies:  Allergies  Allergen Reactions   Amoxicillin Hives   Asa [Aspirin] Hives   Cefoxitin Itching and Rash   Sulfa Antibiotics Hives    Metabolic Disorder Labs: Lab Results  Component Value Date   HGBA1C 5.2 02/01/2017   No  results found for: PROLACTIN Lab Results  Component Value Date   CHOL 159 02/01/2017   TRIG 85 02/01/2017   HDL 44 02/01/2017   CHOLHDL 3.6 02/01/2017   LDLCALC 98 02/01/2017   LDLCALC 112 (H) 09/14/2016   Lab Results  Component Value Date   TSH 1.500 09/14/2016    Therapeutic Level Labs: No results found for: LITHIUM No results found for: VALPROATE No components found for:  CBMZ  Current Medications: Current Outpatient Medications  Medication Sig Dispense Refill   albuterol (PROVENTIL) (2.5 MG/3ML) 0.083% nebulizer solution Take 3 mLs (2.5 mg total) by nebulization every 6 (six) hours as needed for wheezing or shortness of breath. 75 mL 3   Albuterol Sulfate (PROAIR RESPICLICK) 123XX123 (90 Base) MCG/ACT AEPB Inhale 1-2 puffs into the lungs every 6 (six) hours as needed (shortness of breath).     benzonatate (TESSALON) 100 MG capsule Take 2 capsules (200 mg total) by mouth every 8 (eight) hours. 21 capsule 0   buPROPion (WELLBUTRIN SR) 200 MG 12 hr tablet Take 1 tablet (200 mg total) by mouth 2 (two) times daily. Please take the second dose not later than 4 PM. 60 tablet 2   clonazePAM (KLONOPIN) 1 MG tablet 1  qhs   1  prn  q day 60 tablet 4   fluticasone (FLONASE) 50 MCG/ACT nasal spray Place 1 spray into both nostrils daily. 16 g 5   lamoTRIgine (LAMICTAL) 200 MG tablet Take 1 tablet (200 mg total) by mouth at bedtime. 90 tablet 1   levocetirizine (XYZAL) 5 MG tablet TAKE 1 TABLET(5 MG) BY MOUTH EVERY EVENING 30 tablet 5   lurasidone (LATUDA) 40 MG TABS tablet Take 1 tablet (40 mg total) by mouth at bedtime. TAKE 1 TABLET(40 MG) BY MOUTH DAILY WITH SUPPER 14 tablet 0   montelukast (SINGULAIR) 10 MG tablet Take 1 tablet (10 mg total) by mouth at bedtime. 30 tablet 5   pantoprazole (PROTONIX) 40 MG tablet TAKE 1 TABLET(40 MG) BY MOUTH TWICE DAILY 60 tablet 5   sodium chloride (OCEAN) 0.65 % SOLN nasal spray Place 1 spray into both nostrils as needed for congestion. 15 mL 0   traZODone  (DESYREL) 50 MG tablet Take 1 tablet (50 mg total) by mouth at bedtime as needed for sleep. 30 tablet 2   No current facility-administered medications for this visit.      Psychiatric Specialty Exam: Review of Systems  Psychiatric/Behavioral:  The patient is nervous/anxious.   All other systems reviewed and are negative.   There were no vitals taken for this visit.There is no height or weight on file to calculate BMI.  General Appearance: NA  Eye Contact:  NA  Speech:  Clear and Coherent and Normal Rate  Volume:  Normal  Mood:  Anxious  Affect:  NA  Thought Process:  Goal Directed  Orientation:  Full (Time, Place, and Person)  Thought Content: Rumination   Suicidal Thoughts:  No  Homicidal Thoughts:  No  Memory:  Immediate;   Good Recent;   Good Remote;   Good  Judgement:  Good  Insight:  Fair  Psychomotor Activity:  NA  Concentration:  Concentration: Fair  Recall:  Good  Fund of Knowledge: Good  Language: Good  Akathisia:  Negative  Handed:  Right  AIMS (if indicated): not done  Assets:  Communication Skills Desire for Improvement Financial Resources/Insurance Housing Social Support Talents/Skills  ADL's:  Intact  Cognition: WNL  Sleep:  Good   Screenings: GAD-7    Flowsheet Row Office Visit from 06/18/2019 in [place] Office Visit from 06/05/2019 in [place] Office Visit from 04/01/2019 in [facility]  Total GAD-7 Score 20 19 0      PHQ2-9    Flowsheet Row Video Visit from 11/15/2020 in [facility] Office Visit from 08/20/2020 in [facility] Office Visit from 06/18/2019 in [place] Office Visit from 06/05/2019 in [place] Office Visit from 04/01/2019 in [facility]  PHQ-2 Total Score 0 0 5 6 0  PHQ-9 Total Score -- 0 '20 19 3      '$ Flowsheet Row ED from 09/06/2020 in [place] No Risk        Assessment and Plan:      This patient's first problem is that of bipolar disorder type II.  At this time given that she has a bipolar disorder although had not reduce her antidepressant load.  Paxil was the last antidepressant, but I will go ahead and discontinue it.  She will reduce to 20 mg down to 10 mg for 1 week and then discontinue it.  She will continue taking her Wellbutrin as prescribed.  She will continue taking 200 mg of Lamictal she will continue taking 20 mg of Latuda.  Her second problem is an adjustment disorder with an anxious mood state.  At this time she will continue taking Klonopin but she herself has reduced it.  She was taking 1 mg twice daily.  Now she is changed to taking 1 mg at night and 1 as needed.  Patient is also going back to therapy.  She is on a 90-day period where she does not have insurance but when asked him to go back to [employer].  The patient is very stable at this time.  She is functioning very well.  She will return to see me in 3 months.   Dx: Bipolar 2 disorder rapid cycling; Mixed anxiety disorder (panic disorder' GAD, social anxiety)   Plan:  Continue Lamictal to 200 mg at HS, Latuda 40 mg at HS, paroxetine 20 mg in PM, trazodone 50 mg prn sleep and bupropion SR 200 mg bid for fatigue. I will stop lorazepam 2 mg prn anxiety (up to tid) and start clonazepam 1 mg bid scheduled initially (then advised to start using it prn after anxiety decreases). We shall write her an excuse from work note for 3/26-4/18/22 period.  Next appointment with a new provider in 1 month. The plan was discussed with patient who had an opportunity to ask questions and these were all answered. I spend 15 minutes in phone consultation with the  patient.    Jerral Ralph, MD 02/02/2021, 3:06 PM

## 2021-02-23 ENCOUNTER — Other Ambulatory Visit: Payer: Self-pay

## 2021-02-23 ENCOUNTER — Telehealth (INDEPENDENT_AMBULATORY_CARE_PROVIDER_SITE_OTHER): Payer: Self-pay | Admitting: Psychiatry

## 2021-02-23 DIAGNOSIS — F319 Bipolar disorder, unspecified: Secondary | ICD-10-CM

## 2021-02-23 NOTE — Progress Notes (Signed)
[facility] MD/PA/NP OP Progress Note  02/23/2021 2:50 PM Nicole Rodriguez  MRN:  [id number] Interview was conducted by phone and I verified that I was speaking with the correct person using two identifiers. I discussed the limitations of evaluation and management by telemedicine and  the availability of in person appointments. Patient expressed understanding and agreed to proceed. Participants in the visit: patient (location - home); physician (location - home office).  Chief Complaint: Panic attacks.  Today the patient was interviewed by phone in a visit that was moved up.  She actually was seen just 3 weeks ago.  Her complaint that had the same complaint that is now is that she feels very lethargic and unmotivated and has a lot of energy.  This is the case despite the fact that she is sleeping well.  She sleeps 8 hours.  The patient drinks no alcohol and uses no drugs.  When I saw her 3 weeks ago our plan was simply to discontinue her Paxil which possibly could have explained all of this.  She has been off the Paxil completely for the last 2 weeks as she feels the same.  Very lethargic and unmotivated difficulty getting out of bed despite sleeping well.  The patient distinctly denies being depressed.  She denies being anxious.  Noticed previously she is often reduced her Klonopin to just taking 1 mg at night.  The patient is eating normally.  She has no psychosis.  She says she was diagnosed with bipolar type II.  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  Emotionally she is actually very stable.  We reviewed some of her labs and she shows no signs of anemia.  Apparently many many months ago she has some thyroid blood work done but is not available to me.  The patient is yet to return to her therapist Rob Hickman.  The patient is having no significant interpersonal conflicts at this time.  She really does not want to get  up and go to work.  She is claiming I might agree with her that is due to a physiological affect possibly her medications.  There is no psychosocial explanation of why she is having this lack of energy and motivation. Visit Diagnosis:  No diagnosis found.  Past Psychiatric History: Please see intake H&P.  Past Medical History:  Past Medical History:  Diagnosis Date   Asthma    Bipolar disorder ([place])    Generalized anxiety disorder    GERD (gastroesophageal reflux disease)    Phreesia 08/17/2020   IBS (irritable bowel syndrome)    Plantar fasciitis 10/15/2018   Rectal bleeding 09/24/2019   Social anxiety disorder     Past Surgical History:  Procedure Laterality Date   APPENDECTOMY     BREAST LUMPECTOMY     COLONOSCOPY WITH PROPOFOL N/A 12/03/2019   Procedure: COLONOSCOPY WITH PROPOFOL;  Surgeon: Virgel Manifold, MD;  Location: [facility] ENDOSCOPY;  Service: Endoscopy;  Laterality: N/A;   ESOPHAGOGASTRODUODENOSCOPY (EGD) WITH PROPOFOL N/A 12/03/2019   Procedure: ESOPHAGOGASTRODUODENOSCOPY (EGD) WITH PROPOFOL;  Surgeon: Virgel Manifold, MD;  Location: [facility] ENDOSCOPY;  Service: Endoscopy;  Laterality: N/A;   FOOT SURGERY      Family Psychiatric History: Reviewed.  Family History:  Family History  Problem Relation Age of Onset   Asthma Father    Diabetes Other    Heart failure Other    Anxiety disorder  Maternal Grandmother    Depression Maternal Grandmother    Crohn's disease Sister        half sister   Diverticulitis Paternal Grandmother    Colon cancer Neg Hx    Celiac disease Neg Hx     Social History:  Social History   Socioeconomic History   Marital status: Married    Spouse name: Not on file   Number of children: 1   Years of education: Not on file   Highest education level: Not on file  Occupational History   Not on file  Tobacco Use   Smoking status: Never   Smokeless tobacco: Never  Vaping Use   Vaping Use: Never used  Substance and Sexual Activity    Alcohol use: Yes    Comment: socially    Drug use: No   Sexual activity: Yes    Birth control/protection: None  Other Topics Concern   Not on file  Social History Narrative   Daughter 66 years old.   Social Determinants of Health   Financial Resource Strain: Not on file  Food Insecurity: Not on file  Transportation Needs: Not on file  Physical Activity: Not on file  Stress: Not on file  Social Connections: Not on file    Allergies:  Allergies  Allergen Reactions   Amoxicillin Hives   Asa [Aspirin] Hives   Cefoxitin Itching and Rash   Sulfa Antibiotics Hives    Metabolic Disorder Labs: Lab Results  Component Value Date   HGBA1C 5.2 02/01/2017   No results found for: PROLACTIN Lab Results  Component Value Date   CHOL 159 02/01/2017   TRIG 85 02/01/2017   HDL 44 02/01/2017   CHOLHDL 3.6 02/01/2017   LDLCALC 98 02/01/2017   LDLCALC 112 (H) 09/14/2016   Lab Results  Component Value Date   TSH 1.500 09/14/2016    Therapeutic Level Labs: No results found for: LITHIUM No results found for: VALPROATE No components found for:  CBMZ  Current Medications: Current Outpatient Medications  Medication Sig Dispense Refill   albuterol (PROVENTIL) (2.5 MG/3ML) 0.083% nebulizer solution Take 3 mLs (2.5 mg total) by nebulization every 6 (six) hours as needed for wheezing or shortness of breath. 75 mL 3   Albuterol Sulfate (PROAIR RESPICLICK) 123XX123 (90 Base) MCG/ACT AEPB Inhale 1-2 puffs into the lungs every 6 (six) hours as needed (shortness of breath).     benzonatate (TESSALON) 100 MG capsule Take 2 capsules (200 mg total) by mouth every 8 (eight) hours. 21 capsule 0   buPROPion (WELLBUTRIN SR) 200 MG 12 hr tablet Take 1 tablet (200 mg total) by mouth 2 (two) times daily. Please take the second dose not later than 4 PM. 60 tablet 2   clonazePAM (KLONOPIN) 1 MG tablet 1  qhs   1  prn  q day 60 tablet 4   fluticasone (FLONASE) 50 MCG/ACT nasal spray Place 1 spray into both  nostrils daily. 16 g 5   lamoTRIgine (LAMICTAL) 200 MG tablet Take 1 tablet (200 mg total) by mouth at bedtime. 90 tablet 1   levocetirizine (XYZAL) 5 MG tablet TAKE 1 TABLET(5 MG) BY MOUTH EVERY EVENING 30 tablet 5   lurasidone (LATUDA) 40 MG TABS tablet Take 1 tablet (40 mg total) by mouth at bedtime. TAKE 1 TABLET(40 MG) BY MOUTH DAILY WITH SUPPER 14 tablet 0   montelukast (SINGULAIR) 10 MG tablet Take 1 tablet (10 mg total) by mouth at bedtime. 30 tablet 5   pantoprazole (PROTONIX)  40 MG tablet TAKE 1 TABLET(40 MG) BY MOUTH TWICE DAILY 60 tablet 5   sodium chloride (OCEAN) 0.65 % SOLN nasal spray Place 1 spray into both nostrils as needed for congestion. 15 mL 0   traZODone (DESYREL) 50 MG tablet Take 1 tablet (50 mg total) by mouth at bedtime as needed for sleep. 30 tablet 2   No current facility-administered medications for this visit.      Psychiatric Specialty Exam: Review of Systems  Psychiatric/Behavioral:  The patient is nervous/anxious.   All other systems reviewed and are negative.   There were no vitals taken for this visit.There is no height or weight on file to calculate BMI.  General Appearance: NA  Eye Contact:  NA  Speech:  Clear and Coherent and Normal Rate  Volume:  Normal  Mood:  Anxious  Affect:  NA  Thought Process:  Goal Directed  Orientation:  Full (Time, Place, and Person)  Thought Content: Rumination   Suicidal Thoughts:  No  Homicidal Thoughts:  No  Memory:  Immediate;   Good Recent;   Good Remote;   Good  Judgement:  Good  Insight:  Fair  Psychomotor Activity:  NA  Concentration:  Concentration: Fair  Recall:  Good  Fund of Knowledge: Good  Language: Good  Akathisia:  Negative  Handed:  Right  AIMS (if indicated): not done  Assets:  Communication Skills Desire for Improvement Financial Resources/Insurance Housing Social Support Talents/Skills  ADL's:  Intact  Cognition: WNL  Sleep:  Good   Screenings: GAD-7    Flowsheet Row Office  Visit from 06/18/2019 in [place] Office Visit from 06/05/2019 in [place] Office Visit from 04/01/2019 in [facility]  Total GAD-7 Score 20 19 0      PHQ2-9    Flowsheet Row Video Visit from 11/15/2020 in [facility] Office Visit from 08/20/2020 in [facility] Office Visit from 06/18/2019 in [place] Office Visit from 06/05/2019 in [place] Office Visit from 04/01/2019 in [facility]  PHQ-2 Total Score 0 0 5 6 0  PHQ-9 Total Score -- 0 '20 19 3      '$ Flowsheet Row ED from 09/06/2020 in [place] No Risk        Assessment and Plan:    This patient's diagnosis is that of bipolar type II.  We will take a closer look at this condition but for now it seems reasonable to discontinue her Klonopin.  To see if her energy level improves and her ability to get out of bed is better.  After 1 week if there is no difference I will ask her to discontinue her low-dose Latuda 20 mg.  I will see her again back about a month later in about 6 to 7 weeks from now.  She is instructed to call if there are any significant changes.  She will continue taking Lamictal 200 mg and also continue taking her Wellbutrin as prescribed.  The patient is functioning fairly well but would be functioning a bit better if she had more energy and to get out of bed.   Dx: Bipolar 2 disorder rapid cycling; Mixed anxiety disorder (panic disorder' GAD, social anxiety)   Plan:  Continue Lamictal to 200 mg at HS, Latuda 40 mg at HS, paroxetine 20 mg in PM, trazodone 50 mg prn sleep and bupropion SR 200 mg bid for fatigue. I will stop lorazepam 2 mg prn  anxiety (up to tid) and start clonazepam 1 mg bid scheduled initially (then advised to start using it prn after anxiety decreases). We shall write her an excuse from work note for 3/26-4/18/22 period.  Next appointment with a new provider in 1 month. The  plan was discussed with patient who had an opportunity to ask questions and these were all answered. I spend 15 minutes in phone consultation with the patient.    Jerral Ralph, MD 02/23/2021, 2:50 PM

## 2021-04-07 ENCOUNTER — Ambulatory Visit
Admission: RE | Admit: 2021-04-07 | Discharge: 2021-04-07 | Disposition: A | Discharge status: Home / Self Care | Payer: BC Managed Care – PPO | Source: Ambulatory Visit | Attending: Internal Medicine | Admitting: Internal Medicine

## 2021-04-07 ENCOUNTER — Other Ambulatory Visit: Payer: Self-pay

## 2021-04-07 VITALS — BP 127/92 | HR 90 | Temp 98.1°F | Resp 18

## 2021-04-07 DIAGNOSIS — H60392 Other infective otitis externa, left ear: Secondary | ICD-10-CM | POA: Diagnosis not present

## 2021-04-07 MED ORDER — GUAIFENESIN ER 600 MG PO TB12: 600.0000 mg | ORAL_TABLET | Freq: Two times a day (BID) | ORAL | 0 refills | Status: AC

## 2021-04-07 MED ORDER — CIPROFLOXACIN-DEXAMETHASONE 0.3-0.1 % OT SUSP: 4.0000 [drp] | Freq: Two times a day (BID) | OTIC | Status: DC

## 2021-04-07 MED ORDER — HYDROCORTISONE-ACETIC ACID 1-2 % OT SOLN: 3.0000 [drp] | Freq: Two times a day (BID) | OTIC | 0 refills | Status: AC

## 2021-04-07 NOTE — ED Triage Notes (Signed)
Two day h/o left ear pain and nausea with an onset today of right ear pain. Has been taking tylenol with some relief.

## 2021-04-07 NOTE — ED Provider Notes (Signed)
[facility]    CSN: [id number] Arrival date & time: 04/07/21  1154      History   Chief Complaint Chief Complaint  Patient presents with   12p appointment   Otalgia    Bilateral     HPI Nicole Rodriguez is a 30 y.o. female comes to the urgent care with 2-day history of left ear pain.  Patient cleans her ears with Q-tips on a regular basis.  Over the past couple of days she is cleaning the ears more frequently because of the sensation of discharge associated with the ear pain.  No hearing loss, or ringing in the ears.  She has some nausea but no dizziness.  No vertigo.Marland Kitchen   HPI  Past Medical History:  Diagnosis Date   Asthma    Bipolar disorder ([place])    Generalized anxiety disorder    GERD (gastroesophageal reflux disease)    Phreesia 08/17/2020   IBS (irritable bowel syndrome)    Plantar fasciitis 10/15/2018   Rectal bleeding 09/24/2019   Social anxiety disorder     Patient Active Problem List   Diagnosis Date Noted   Intermittent palpitations 08/20/2020   Encounter to establish care 08/20/2020   Gastric polyp    GAD (generalized anxiety disorder) 07/19/2019   Social anxiety disorder 07/19/2019   Panic disorder 07/19/2019   Bipolar 2 disorder ([place]) 07/19/2019   Allergic rhinitis 08/19/2018   GERD (gastroesophageal reflux disease) 08/19/2018   Asthma 11/29/2017   Breast mass, right 03/18/2014   Numbness and tingling in both hands 01/26/2014    Past Surgical History:  Procedure Laterality Date   APPENDECTOMY     BREAST LUMPECTOMY     COLONOSCOPY WITH PROPOFOL N/A 12/03/2019   Procedure: COLONOSCOPY WITH PROPOFOL;  Surgeon: Virgel Manifold, MD;  Location: [facility];  Service: Endoscopy;  Laterality: N/A;   ESOPHAGOGASTRODUODENOSCOPY (EGD) WITH PROPOFOL N/A 12/03/2019   Procedure: ESOPHAGOGASTRODUODENOSCOPY (EGD) WITH PROPOFOL;  Surgeon: Virgel Manifold, MD;  Location: [facility];  Service: Endoscopy;  Laterality: N/A;   FOOT SURGERY       OB History     Gravida  0   Para  0   Term  0   Preterm  0   AB  0   Living  0      SAB  0   IAB  0   Ectopic  0   Multiple  0   Live Births  0            Home Medications    Prior to Admission medications   Medication Sig Start Date End Date Taking? Authorizing Provider  acetic acid-hydrocortisone (VOSOL-HC) OTIC solution Place 3 drops into the left ear 2 (two) times daily for 5 days. 04/07/21 04/12/21 Yes Catalyna Reilly, Myrene Galas, MD  guaiFENesin (MUCINEX) 600 MG 12 hr tablet Take 1 tablet (600 mg total) by mouth 2 (two) times daily for 15 days. 04/07/21 04/22/21 Yes Clevland Cork, Myrene Galas, MD  albuterol (PROVENTIL) (2.5 MG/3ML) 0.083% nebulizer solution Take 3 mLs (2.5 mg total) by nebulization every 6 (six) hours as needed for wheezing or shortness of breath. 02/11/20   Malachy Mood, MD  Albuterol Sulfate (PROAIR RESPICLICK) 009 (90 Base) MCG/ACT AEPB Inhale 1-2 puffs into the lungs every 6 (six) hours as needed (shortness of breath).    [provider]  benzonatate (TESSALON) 100 MG capsule Take 2 capsules (200 mg total) by mouth every 8 (eight) hours. 09/06/20   Jacqlyn Larsen, PA-C  buPROPion (WELLBUTRIN SR) 200 MG 12 hr tablet Take 1 tablet (200 mg total) by mouth 2 (two) times daily. Please take the second dose not later than 4 PM. 02/02/21 05/03/21  Plovsky, Berneta Sages, MD  clonazePAM (KLONOPIN) 1 MG tablet 1  qhs   1  prn  q day 02/02/21   Norma Fredrickson, MD  fluticasone (FLONASE) 50 MCG/ACT nasal spray Place 1 spray into both nostrils daily. 10/09/19   Parrett, Fonnie Mu, NP  levocetirizine (XYZAL) 5 MG tablet TAKE 1 TABLET(5 MG) BY MOUTH EVERY EVENING 11/02/20   Lindell Spar, MD  montelukast (SINGULAIR) 10 MG tablet Take 1 tablet (10 mg total) by mouth at bedtime. 11/02/20   Lindell Spar, MD  pantoprazole (PROTONIX) 40 MG tablet TAKE 1 TABLET(40 MG) BY MOUTH TWICE DAILY 05/15/20   Collene Gobble, MD  sodium chloride (OCEAN) 0.65 % SOLN nasal spray Place 1 spray  into both nostrils as needed for congestion. 11/15/20   Lindell Spar, MD  traZODone (DESYREL) 50 MG tablet Take 1 tablet (50 mg total) by mouth at bedtime as needed for sleep. 02/02/21 05/03/21  Plovsky, Berneta Sages, MD    Family History Family History  Problem Relation Age of Onset   Asthma Father    Diabetes Other    Heart failure Other    Anxiety disorder Maternal Grandmother    Depression Maternal Grandmother    Crohn's disease Sister        half sister   Diverticulitis Paternal Grandmother    Colon cancer Neg Hx    Celiac disease Neg Hx     Social History Social History   Tobacco Use   Smoking status: Never   Smokeless tobacco: Never  Vaping Use   Vaping Use: Never used  Substance Use Topics   Alcohol use: Yes    Comment: socially    Drug use: No     Allergies   Amoxicillin, Asa [aspirin], Cefoxitin, and Sulfa antibiotics   Review of Systems Review of Systems  HENT:  Positive for ear pain. Negative for ear discharge, facial swelling, hearing loss and sore throat.   Eyes: Negative.     Physical Exam Triage Vital Signs ED Triage Vitals  Enc Vitals Group     BP 04/07/21 1206 (!) 127/92     Pulse Rate 04/07/21 1206 90     Resp 04/07/21 1206 18     Temp 04/07/21 1206 98.1 F (36.7 C)     Temp Source 04/07/21 1206 Oral     SpO2 04/07/21 1206 97 %     Weight --      Height --      Head Circumference --      Peak Flow --      Pain Score 04/07/21 1207 5     Pain Loc --      Pain Edu? --      Excl. in [place]? --    No data found.  Updated Vital Signs BP (!) 127/92 (BP Location: Right Arm)   Pulse 90   Temp 98.1 F (36.7 C) (Oral)   Resp 18   LMP  (LMP Unknown)   SpO2 97%   Visual Acuity Right Eye Distance:   Left Eye Distance:   Bilateral Distance:    Right Eye Near:   Left Eye Near:    Bilateral Near:     Physical Exam Vitals and nursing note reviewed.  Constitutional:      General: She is not in  acute distress. HENT:     Right Ear: Tympanic  membrane normal.     Left Ear: Tympanic membrane normal.     Ears:     Comments: Left external ear canal: Erythema with no discharge.  Right middle ear effusion.    Nose: Nose normal.     Mouth/Throat:     Mouth: Mucous membranes are moist.  Cardiovascular:     Rate and Rhythm: Normal rate and regular rhythm.     Pulses: Normal pulses.     Heart sounds: Normal heart sounds.  Pulmonary:     Effort: Pulmonary effort is normal.     Breath sounds: Normal breath sounds.  Neurological:     Mental Status: She is alert.     UC Treatments / Results  Labs (all labs ordered are listed, but only abnormal results are displayed) Labs Reviewed - No data to display  EKG   Radiology No results found.  Procedures Procedures (including critical care time)  Medications Ordered in UC Medications - No data to display  Initial Impression / Assessment and Plan / UC Course  I have reviewed the triage vital signs and the nursing notes.  Pertinent labs & imaging results that were available during my care of the patient were reviewed by me and considered in my medical decision making (see chart for details).     1.  Otitis externa in the left ear: VoSoL eardrops twice daily for 5 days Mucinex twice daily Avoid cleaning ears with a Q-tip Return to urgent care if symptoms worsen. Final Clinical Impressions(s) / UC Diagnoses   Final diagnoses:  Infective otitis externa of left ear     Discharge Instructions      Medications as prescribed Avoiding cleaning your ears on a daily basis Return to urgent care if symptoms worsen.   ED Prescriptions     Medication Sig Dispense Auth. Provider   guaiFENesin (MUCINEX) 600 MG 12 hr tablet Take 1 tablet (600 mg total) by mouth 2 (two) times daily for 15 days. 30 tablet Rubie Ficco, Myrene Galas, MD   acetic acid-hydrocortisone (VOSOL-HC) OTIC solution Place 3 drops into the left ear 2 (two) times daily for 5 days. 10 mL Murad Staples, Myrene Galas, MD       PDMP not reviewed this encounter.   Chase Picket, MD 04/07/21 1259

## 2021-04-07 NOTE — Discharge Instructions (Addendum)
Medications as prescribed Avoiding cleaning your ears on a daily basis Return to urgent care if symptoms worsen.

## 2021-04-15 ENCOUNTER — Ambulatory Visit: Payer: Self-pay | Admitting: Internal Medicine

## 2021-05-03 ENCOUNTER — Ambulatory Visit
Admission: RE | Admit: 2021-05-03 | Discharge: 2021-05-03 | Disposition: A | Discharge status: Home / Self Care | Payer: Self-pay | Source: Ambulatory Visit | Attending: Family Medicine | Admitting: Family Medicine

## 2021-05-03 ENCOUNTER — Other Ambulatory Visit: Payer: Self-pay

## 2021-05-03 VITALS — BP 128/90 | HR 84 | Temp 98.1°F | Resp 16

## 2021-05-03 DIAGNOSIS — M62830 Muscle spasm of back: Secondary | ICD-10-CM

## 2021-05-03 MED ORDER — METHOCARBAMOL 500 MG PO TABS: 500.0000 mg | ORAL_TABLET | Freq: Two times a day (BID) | ORAL | 0 refills | Status: DC

## 2021-05-03 NOTE — ED Provider Notes (Signed)
[facility]   [id number] 05/03/21 Arrival Time: 9509  ASSESSMENT & PLAN:  1. Muscle spasm of back    Able to ambulate here and hemodynamically stable. No indication for imaging of back at this time given no trauma and normal neurological exam. Discussed.  Begin trial of: Meds ordered this encounter  Medications   methocarbamol (ROBAXIN) 500 MG tablet    Sig: Take 1 tablet (500 mg total) by mouth 2 (two) times daily.    Dispense:  20 tablet    Refill:  0   Encourage ROM/movement as tolerated.  Recommend:  Follow-up Information     Lindell Spar, MD.   Specialty: Internal Medicine Why: If worsening or failing to improve as anticipated. Contact information: [place] [address] [phone]                 Reviewed expectations re: course of current medical issues. Questions answered. Outlined signs and symptoms indicating need for more acute intervention. Patient verbalized understanding. After Visit Summary given.   SUBJECTIVE: History from: patient.  Nicole Rodriguez is a 30 y.o. female who presents with complaint of L sided lower back pain; sharp in nature; worse with certain movements. No trauma/injury. Does lift at work. No extremity sensation changes or weakness. Ambulatory without difficulty. Pain present x 2 days. Ibuprofen with some help.   OBJECTIVE:  Vitals:   05/03/21 1517  BP: 128/90  Pulse: 84  Resp: 16  Temp: 98.1 F (36.7 C)  TempSrc: Oral  SpO2: 97%    General appearance: alert; no distress HEENT: [place]; AT Neck: supple with FROM; without midline tenderness CV: regular Lungs: unlabored respirations; speaks full sentences without difficulty Abdomen: soft, non-tender; non-distended Back: mild  and poorly localized tenderness to palpation over L lumbar musculature ; FROM at waist; without midline tenderness Extremities: without edema; symmetrical without gross deformities; normal ROM of all  extremities Skin: warm and dry Neurologic: normal gait; normal sensation and strength of bilateral LE Psychological: alert and cooperative; normal mood and affect    Allergies  Allergen Reactions   Amoxicillin Hives   Asa [Aspirin] Hives   Cefoxitin Itching and Rash   Sulfa Antibiotics Hives    Past Medical History:  Diagnosis Date   Asthma    Bipolar disorder (H[facility])    Generalized anxiety disorder    GERD (gastroesophageal reflux disease)    Phreesia 08/17/2020   IBS (irritable bowel syndrome)    Plantar fasciitis 10/15/2018   Rectal bleeding 09/24/2019   Social anxiety disorder    Social History   Socioeconomic History   Marital status: Married    Spouse name: Not on file   Number of children: 1   Years of education: Not on file   Highest education level: Not on file  Occupational History   Not on file  Tobacco Use   Smoking status: Never   Smokeless tobacco: Never  Vaping Use   Vaping Use: Never used  Substance and Sexual Activity   Alcohol use: Yes    Comment: socially    Drug use: No   Sexual activity: Yes    Birth control/protection: None  Other Topics Concern   Not on file  Social History Narrative   Daughter 59 years old.   Social Determinants of Health   Financial Resource Strain: Not on file  Food Insecurity: Not on file  Transportation Needs: Not on file  Physical Activity: Not on file  Stress: Not on file  Social  Connections: Not on file  Intimate Partner Violence: Not on file   Family History  Problem Relation Age of Onset   Asthma Father    Diabetes Other    Heart failure Other    Anxiety disorder Maternal Grandmother    Depression Maternal Grandmother    Crohn's disease Sister        half sister   Diverticulitis Paternal Grandmother    Colon cancer Neg Hx    Celiac disease Neg Hx    Past Surgical History:  Procedure Laterality Date   APPENDECTOMY     BREAST LUMPECTOMY     COLONOSCOPY WITH PROPOFOL N/A 12/03/2019   Procedure:  COLONOSCOPY WITH PROPOFOL;  Surgeon: Virgel Manifold, MD;  Location: [facility];  Service: Endoscopy;  Laterality: N/A;   ESOPHAGOGASTRODUODENOSCOPY (EGD) WITH PROPOFOL N/A 12/03/2019   Procedure: ESOPHAGOGASTRODUODENOSCOPY (EGD) WITH PROPOFOL;  Surgeon: Virgel Manifold, MD;  Location: [facility];  Service: Endoscopy;  Laterality: N/A;   FOOT SURGERY        Vanessa Kick, MD 05/03/21 1556

## 2021-05-03 NOTE — ED Triage Notes (Signed)
Pt presents with left side lower back pain xs 2 days.

## 2021-05-04 ENCOUNTER — Telehealth (HOSPITAL_COMMUNITY): Payer: Self-pay | Admitting: Psychiatry

## 2021-05-21 ENCOUNTER — Telehealth: Payer: Self-pay | Admitting: Nurse Practitioner

## 2021-05-21 DIAGNOSIS — J208 Acute bronchitis due to other specified organisms: Secondary | ICD-10-CM

## 2021-05-21 MED ORDER — PROMETHAZINE-DM 6.25-15 MG/5ML PO SYRP
5.0000 mL | ORAL_SOLUTION | Freq: Four times a day (QID) | ORAL | 0 refills | Status: DC | PRN
Start: 2021-05-21 — End: 2021-06-15

## 2021-05-21 MED ORDER — PREDNISONE 20 MG PO TABS
20.0000 mg | ORAL_TABLET | Freq: Every day | ORAL | 0 refills | Status: AC
Start: 2021-05-21 — End: 2021-05-27

## 2021-05-21 NOTE — Progress Notes (Signed)
Virtual Visit Consent   Nicole Rodriguez, you are scheduled for a virtual visit with a [facility] provider today.     Just as with appointments in the office, your consent must be obtained to participate.  Your consent will be active for this visit and any virtual visit you may have with one of our providers in the next 365 days.     If you have a MyChart account, a copy of this consent can be sent to you electronically.  All virtual visits are billed to your insurance company just like a traditional visit in the office.    As this is a virtual visit, video technology does not allow for your provider to perform a traditional examination.  This may limit your provider's ability to fully assess your condition.  If your provider identifies any concerns that need to be evaluated in person or the need to arrange testing (such as labs, EKG, etc.), we will make arrangements to do so.     Although advances in technology are sophisticated, we cannot ensure that it will always work on either your end or our end.  If the connection with a video visit is poor, the visit may have to be switched to a telephone visit.  With either a video or telephone visit, we are not always able to ensure that we have a secure connection.     I need to obtain your verbal consent now.   Are you willing to proceed with your visit today?    Nicole Rodriguez has provided verbal consent on 05/21/2021 for a virtual visit (video or telephone).   Gildardo Pounds, NP   Date: 05/21/2021 10:20 AM   Virtual Visit via Video Note   I, Gildardo Pounds, connected with  Nicole Rodriguez  ([id number], [birth year]) on 05/21/21 at 10:15 AM EST by a video-enabled telemedicine application and verified that I am speaking with the correct person using two identifiers.  Location: Patient: Virtual Visit Location Patient: Home Provider: Virtual Visit Location Provider: Home Office   I discussed the limitations of evaluation and management by  telemedicine and the availability of in person appointments. The patient expressed understanding and agreed to proceed.    History of Present Illness: Nicole Rodriguez is a 30 y.o. who identifies as a female who was assigned female at birth, and is being seen today for ACUTE VIRAL BRONCHITIS.  HPI:  Onset of symptoms 5 days ago. Currently with sore throat, cough, congestion. Cough is productive of yellow sputum. She does have a history of allergies. Does not smoke. Currently using xyzal, singulair, SABA and flonase as well as OTC Theraflu cold and sinus.    Problems:  Patient Active Problem List   Diagnosis Date Noted   Intermittent palpitations 08/20/2020   Encounter to establish care 08/20/2020   Gastric polyp    GAD (generalized anxiety disorder) 07/19/2019   Social anxiety disorder 07/19/2019   Panic disorder 07/19/2019   Bipolar 2 disorder ([place]) 07/19/2019   Allergic rhinitis 08/19/2018   GERD (gastroesophageal reflux disease) 08/19/2018   Asthma 11/29/2017   Breast mass, right 03/18/2014   Numbness and tingling in both hands 01/26/2014    Allergies:  Allergies  Allergen Reactions   Amoxicillin Hives   Asa [Aspirin] Hives   Cefoxitin Itching and Rash   Sulfa Antibiotics Hives   Medications:  Current Outpatient Medications:    predniSONE (DELTASONE) 20 MG tablet, Take 1 tablet (20 mg total) by mouth daily with breakfast for 6  days., Disp: 6 tablet, Rfl: 0   promethazine-dextromethorphan (PROMETHAZINE-DM) 6.25-15 MG/5ML syrup, Take 5 mLs by mouth 4 (four) times daily as needed for cough., Disp: 240 mL, Rfl: 0   albuterol (PROVENTIL) (2.5 MG/3ML) 0.083% nebulizer solution, Take 3 mLs (2.5 mg total) by nebulization every 6 (six) hours as needed for wheezing or shortness of breath., Disp: 75 mL, Rfl: 3   Albuterol Sulfate (PROAIR RESPICLICK) 518 (90 Base) MCG/ACT AEPB, Inhale 1-2 puffs into the lungs every 6 (six) hours as needed (shortness of breath)., Disp: , Rfl:    buPROPion  (WELLBUTRIN SR) 200 MG 12 hr tablet, Take 1 tablet (200 mg total) by mouth 2 (two) times daily. Please take the second dose not later than 4 PM., Disp: 60 tablet, Rfl: 2   clonazePAM (KLONOPIN) 1 MG tablet, 1  qhs   1  prn  q day, Disp: 60 tablet, Rfl: 4   fluticasone (FLONASE) 50 MCG/ACT nasal spray, Place 1 spray into both nostrils daily., Disp: 16 g, Rfl: 5   levocetirizine (XYZAL) 5 MG tablet, TAKE 1 TABLET(5 MG) BY MOUTH EVERY EVENING, Disp: 30 tablet, Rfl: 5   montelukast (SINGULAIR) 10 MG tablet, Take 1 tablet (10 mg total) by mouth at bedtime., Disp: 30 tablet, Rfl: 5   pantoprazole (PROTONIX) 40 MG tablet, TAKE 1 TABLET(40 MG) BY MOUTH TWICE DAILY, Disp: 60 tablet, Rfl: 5   sodium chloride (OCEAN) 0.65 % SOLN nasal spray, Place 1 spray into both nostrils as needed for congestion., Disp: 15 mL, Rfl: 0   traZODone (DESYREL) 50 MG tablet, Take 1 tablet (50 mg total) by mouth at bedtime as needed for sleep., Disp: 30 tablet, Rfl: 2  Observations/Objective: Patient is well-developed, well-nourished in no acute distress.  Resting comfortably  at home.  Head is normocephalic, atraumatic.  No labored breathing. There is a productive cough present Speech is clear and coherent with logical content.  Patient is alert and oriented at baseline.    Assessment and Plan: 1. Acute viral bronchitis - promethazine-dextromethorphan (PROMETHAZINE-DM) 6.25-15 MG/5ML syrup; Take 5 mLs by mouth 4 (four) times daily as needed for cough.  Dispense: 240 mL; Refill: 0 - predniSONE (DELTASONE) 20 MG tablet; Take 1 tablet (20 mg total) by mouth daily with breakfast for 6 days.  Dispense: 6 tablet; Refill: 0 INSTRUCTIONS: use a humidifier for nasal congestion Drink plenty of fluids, rest and wash hands frequently to avoid the spread of infection Alternate tylenol and Motrin for relief of fever   Follow Up Instructions: I discussed the assessment and treatment plan with the patient. The patient was provided an  opportunity to ask questions and all were answered. The patient agreed with the plan and demonstrated an understanding of the instructions.  A copy of instructions were sent to the patient via MyChart unless otherwise noted below.    The patient was advised to call back or seek an in-person evaluation if the symptoms worsen or if the condition fails to improve as anticipated.  Time:  I spent 10 minutes with the patient via telehealth technology discussing the above problems/concerns including reviewing of pertinent health history and medications.   Gildardo Pounds, NP

## 2021-05-21 NOTE — Patient Instructions (Signed)
Nicole Rodriguez, thank you for joining Gildardo Pounds, NP for today's virtual visit.  While this provider is not your primary care provider (PCP), if your PCP is located in our provider database this encounter information will be shared with them immediately following your visit.  Consent: (Patient) Nicole Rodriguez provided verbal consent for this virtual visit at the beginning of the encounter.  Current Medications:  Current Outpatient Medications:    predniSONE (DELTASONE) 20 MG tablet, Take 1 tablet (20 mg total) by mouth daily with breakfast for 6 days., Disp: 6 tablet, Rfl: 0   promethazine-dextromethorphan (PROMETHAZINE-DM) 6.25-15 MG/5ML syrup, Take 5 mLs by mouth 4 (four) times daily as needed for cough., Disp: 240 mL, Rfl: 0   albuterol (PROVENTIL) (2.5 MG/3ML) 0.083% nebulizer solution, Take 3 mLs (2.5 mg total) by nebulization every 6 (six) hours as needed for wheezing or shortness of breath., Disp: 75 mL, Rfl: 3   Albuterol Sulfate (PROAIR RESPICLICK) 220 (90 Base) MCG/ACT AEPB, Inhale 1-2 puffs into the lungs every 6 (six) hours as needed (shortness of breath)., Disp: , Rfl:    buPROPion (WELLBUTRIN SR) 200 MG 12 hr tablet, Take 1 tablet (200 mg total) by mouth 2 (two) times daily. Please take the second dose not later than 4 PM., Disp: 60 tablet, Rfl: 2   clonazePAM (KLONOPIN) 1 MG tablet, 1  qhs   1  prn  q day, Disp: 60 tablet, Rfl: 4   fluticasone (FLONASE) 50 MCG/ACT nasal spray, Place 1 spray into both nostrils daily., Disp: 16 g, Rfl: 5   levocetirizine (XYZAL) 5 MG tablet, TAKE 1 TABLET(5 MG) BY MOUTH EVERY EVENING, Disp: 30 tablet, Rfl: 5   montelukast (SINGULAIR) 10 MG tablet, Take 1 tablet (10 mg total) by mouth at bedtime., Disp: 30 tablet, Rfl: 5   pantoprazole (PROTONIX) 40 MG tablet, TAKE 1 TABLET(40 MG) BY MOUTH TWICE DAILY, Disp: 60 tablet, Rfl: 5   sodium chloride (OCEAN) 0.65 % SOLN nasal spray, Place 1 spray into both nostrils as needed for congestion., Disp: 15  mL, Rfl: 0   traZODone (DESYREL) 50 MG tablet, Take 1 tablet (50 mg total) by mouth at bedtime as needed for sleep., Disp: 30 tablet, Rfl: 2   Medications ordered in this encounter:  Meds ordered this encounter  Medications   promethazine-dextromethorphan (PROMETHAZINE-DM) 6.25-15 MG/5ML syrup    Sig: Take 5 mLs by mouth 4 (four) times daily as needed for cough.    Dispense:  240 mL    Refill:  0    Order Specific Question:   Supervising Provider    Answer:   MILLER, BRIAN [[id number]]   predniSONE (DELTASONE) 20 MG tablet    Sig: Take 1 tablet (20 mg total) by mouth daily with breakfast for 6 days.    Dispense:  6 tablet    Refill:  0    Order Specific Question:   Supervising Provider    Answer:   Sabra Heck, BRIAN [[id number]]     *If you need refills on other medications prior to your next appointment, please contact your pharmacy*  Follow-Up: Call back or seek an in-person evaluation if the symptoms worsen or if the condition fails to improve as anticipated.  Other Instructions Please keep well-hydrated and get plenty of rest. Start a saline nasal rinse to flush out your nasal passages. You can use plain Mucinex to help thin congestion. If you have a humidifier, running in the bedroom at night. I want you to start OTC vitamin D3  1000 units daily, vitamin C 1000 mg daily, and a zinc supplement. Please take prescribed medications as directed.     If you note any worsening of symptoms, any significant shortness of breath or any chest pain, please seek ER evaluation ASAP.  Please do not delay care!    If you have been instructed to have an in-person evaluation today at a local Urgent Care facility, please use the link below. It will take you to a list of all of our available [facility], including address, phone number and hours of operation. Please do not delay care.  [facility]  If you or a family member do not have a primary care provider, use the link below to  schedule a visit and establish care. When you choose a [facility] primary care physician or advanced practice provider, you gain a long-term partner in health. Find a Primary Care Provider  Learn more about [facility]'s in-office and virtual care options: [facility]

## 2021-06-14 ENCOUNTER — Telehealth: Payer: Self-pay | Admitting: Family Medicine

## 2021-06-14 DIAGNOSIS — R519 Headache, unspecified: Secondary | ICD-10-CM

## 2021-06-14 MED ORDER — AZITHROMYCIN 250 MG PO TABS: ORAL_TABLET | ORAL | 0 refills | Status: AC

## 2021-06-14 NOTE — Patient Instructions (Signed)
Sinus Headache A sinus headache occurs when your sinuses become clogged or swollen. Sinuses are air-filled spaces in your skull that are behind the bones of your face and forehead. Sinus headaches can range from mild to severe. What are the causes? A sinus headache can result from various conditions that affect the sinuses. Common causes include: Colds. Sinus infections. Allergies. Many people confuse sinus headaches with migraines or tension headaches because both headaches can cause facial pain and nasal symptoms. What are the signs or symptoms? The main symptom of this condition is a headache that may feel like pain or pressure in your face, forehead, ears, or upper teeth. People who have a sinus headache often have other symptoms, such as: Congested or runny nose. Fever. Inability to smell. Weather changes can make symptoms worse. How is this diagnosed? This condition may be diagnosed based on: A physical exam and medical history. Imaging tests, such as a CT scan or MRI, to check for problems with the sinuses. Examination of the sinuses using a thin tool with a camera that is inserted through your nose (endoscopy). How is this treated? Treatment for this condition depends on the cause. Sinus pain that is caused by a sinus infection may be treated with antibiotic medicine. Sinus pain that is caused by congestion may be helped by rinsing out (flushing) the nose and sinuses with saline solution. Sinus pain that is caused by allergies may be helped by allergy medicines (antihistamines) and medicated nasal sprays. Sinus surgery may be needed in some cases if other treatments do not help. Follow these instructions at home: General instructions If directed: Apply a warm, moist washcloth to your face to help relieve pain. Use a nasal saline wash. Hydrate and humidify Drink enough water to keep your urine clear or pale yellow. Staying hydrated will help to thin your mucus. Use a cool mist  humidifier to keep the humidity level in your home above 50%. Inhale steam for 10-15 minutes, 3-4 times a day or as told by your health care provider. You can do this in the bathroom while a hot shower is running. Limit your exposure to cool or dry air. Medicines  Take over-the-counter and prescription medicines only as told by your health care provider. If you were prescribed an antibiotic medicine, take it as told by your health care provider. Do not stop taking the antibiotic even if you start to feel better. If you have congestion, use a nasal spray to help lessen pressure. Contact a health care provider if: You have a headache more than one time a week. You have sensitivity to light or sound. You develop a fever. You feel nauseous or you vomit. Your headaches do not get better with treatment. Many people think that they have a sinus headache when they actually have a migraine or a tension headache. Get help right away if: You have vision problems. You have sudden, severe pain in your face or head. You have a seizure. You are confused. You have a stiff neck. Summary A sinus headache occurs when your sinuses become clogged or swollen. A sinus headache can result from various conditions that affect the sinuses, such as a cold, a sinus infection, or an allergy. Treatment for this condition depends on the cause. It may include medicine, such as antibiotics or antihistamines. This information is not intended to replace advice given to you by your health care provider. Make sure you discuss any questions you have with your health care provider. Document Revised: 03/26/2020  Document Reviewed: 03/30/2020 Elsevier Patient Education  [place].

## 2021-06-14 NOTE — Progress Notes (Signed)
Virtual Visit Consent   Nicole Rodriguez, you are scheduled for a virtual visit with a [facility] provider today.     Just as with appointments in the office, your consent must be obtained to participate.  Your consent will be active for this visit and any virtual visit you may have with one of our providers in the next 365 days.     If you have a MyChart account, a copy of this consent can be sent to you electronically.  All virtual visits are billed to your insurance company just like a traditional visit in the office.    As this is a virtual visit, video technology does not allow for your provider to perform a traditional examination.  This may limit your provider's ability to fully assess your condition.  If your provider identifies any concerns that need to be evaluated in person or the need to arrange testing (such as labs, EKG, etc.), we will make arrangements to do so.     Although advances in technology are sophisticated, we cannot ensure that it will always work on either your end or our end.  If the connection with a video visit is poor, the visit may have to be switched to a telephone visit.  With either a video or telephone visit, we are not always able to ensure that we have a secure connection.     I need to obtain your verbal consent now.   Are you willing to proceed with your visit today?    Nicole Rodriguez has provided verbal consent on 06/14/2021 for a virtual visit (video or telephone).   Nicole Mayo, NP   Date: 06/14/2021 9:02 AM   Virtual Visit via Video Note   I, Nicole Rodriguez, connected with  Nicole Rodriguez  ([id number], 08-25-1990) on 06/14/21 at  9:00 AM EST by a video-enabled telemedicine application and verified that I am speaking with the correct person using two identifiers.  Location: Patient: Virtual Visit Location Patient: Home Provider: Virtual Visit Location Provider: Home Office   I discussed the limitations of evaluation and management by  telemedicine and the availability of in person appointments. The patient expressed understanding and agreed to proceed.    History of Present Illness: Nicole Rodriguez is a 30 y.o. who identifies as a female who was assigned female at birth, and is being seen today for headache  HPI: Headache  This is a new problem. The current episode started 1 to 4 weeks ago. The problem occurs daily. The problem has been waxing and waning. The pain is located in the Right unilateral region. The pain does not radiate. The pain quality is similar to prior headaches. The quality of the pain is described as aching. The pain is at a severity of 9/10. The pain is moderate. Associated symptoms include nausea, rhinorrhea and sinus pressure. Pertinent negatives include no coughing, dizziness, drainage, ear pain, eye pain, fever, neck pain or vomiting. Nothing aggravates the symptoms. She has tried acetaminophen and NSAIDs for the symptoms. The treatment provided mild relief.    Recent cold symptoms as well. Headache present for 8 days.  Has not tested for covid at this time. Recent illness in house hold.    Problems:  Patient Active Problem List   Diagnosis Date Noted   Intermittent palpitations 08/20/2020   Encounter to establish care 08/20/2020   Gastric polyp    GAD (generalized anxiety disorder) 07/19/2019   Social anxiety disorder 07/19/2019   Panic disorder 07/19/2019  Bipolar 2 disorder ([place]) 07/19/2019   Allergic rhinitis 08/19/2018   GERD (gastroesophageal reflux disease) 08/19/2018   Asthma 11/29/2017   Breast mass, right 03/18/2014   Numbness and tingling in both hands 01/26/2014    Allergies:  Allergies  Allergen Reactions   Amoxicillin Hives   Asa [Aspirin] Hives   Cefoxitin Itching and Rash   Sulfa Antibiotics Hives   Medications:  Current Outpatient Medications:    albuterol (PROVENTIL) (2.5 MG/3ML) 0.083% nebulizer solution, Take 3 mLs (2.5 mg total) by nebulization every 6 (six)  hours as needed for wheezing or shortness of breath., Disp: 75 mL, Rfl: 3   Albuterol Sulfate (PROAIR RESPICLICK) 562 (90 Base) MCG/ACT AEPB, Inhale 1-2 puffs into the lungs every 6 (six) hours as needed (shortness of breath)., Disp: , Rfl:    buPROPion (WELLBUTRIN SR) 200 MG 12 hr tablet, Take 1 tablet (200 mg total) by mouth 2 (two) times daily. Please take the second dose not later than 4 PM., Disp: 60 tablet, Rfl: 2   clonazePAM (KLONOPIN) 1 MG tablet, 1  qhs   1  prn  q day, Disp: 60 tablet, Rfl: 4   fluticasone (FLONASE) 50 MCG/ACT nasal spray, Place 1 spray into both nostrils daily., Disp: 16 g, Rfl: 5   levocetirizine (XYZAL) 5 MG tablet, TAKE 1 TABLET(5 MG) BY MOUTH EVERY EVENING, Disp: 30 tablet, Rfl: 5   montelukast (SINGULAIR) 10 MG tablet, Take 1 tablet (10 mg total) by mouth at bedtime., Disp: 30 tablet, Rfl: 5   pantoprazole (PROTONIX) 40 MG tablet, TAKE 1 TABLET(40 MG) BY MOUTH TWICE DAILY, Disp: 60 tablet, Rfl: 5   promethazine-dextromethorphan (PROMETHAZINE-DM) 6.25-15 MG/5ML syrup, Take 5 mLs by mouth 4 (four) times daily as needed for cough., Disp: 240 mL, Rfl: 0   sodium chloride (OCEAN) 0.65 % SOLN nasal spray, Place 1 spray into both nostrils as needed for congestion., Disp: 15 mL, Rfl: 0   traZODone (DESYREL) 50 MG tablet, Take 1 tablet (50 mg total) by mouth at bedtime as needed for sleep., Disp: 30 tablet, Rfl: 2  Observations/Objective: Patient is well-developed, well-nourished in no acute distress.  Resting comfortably  at home.  Head is normocephalic, atraumatic.  No labored breathing.  Speech is clear and coherent with logical content.  Patient is alert and oriented at baseline.  Tenderness across right side of forehead when asked to palpate, as well as right cheek  Assessment and Plan: 1. Sinus headache S&S are consistent with a sinus headache that has been present for 8 days. Limited improvement with OTC meds, and no HX of migraines  Will treat with zpack and  advised f/u in person if not improved as well as covid testing if fevers or cold symptoms return.  - azithromycin (ZITHROMAX) 250 MG tablet; Take 2 tablets on day 1, then 1 tablet daily on days 2 through 5  Dispense: 6 tablet; Refill: 0   Work note for today  Follow Up Instructions: I discussed the assessment and treatment plan with the patient. The patient was provided an opportunity to ask questions and all were answered. The patient agreed with the plan and demonstrated an understanding of the instructions.  A copy of instructions were sent to the patient via MyChart unless otherwise noted below.    The patient was advised to call back or seek an in-person evaluation if the symptoms worsen or if the condition fails to improve as anticipated.  Time:  I spent 10 minutes with the patient via telehealth  technology discussing the above problems/concerns.    Nicole Mayo, NP

## 2021-06-15 ENCOUNTER — Telehealth: Payer: Self-pay | Admitting: Physician Assistant

## 2021-06-15 DIAGNOSIS — R519 Headache, unspecified: Secondary | ICD-10-CM

## 2021-06-15 MED ORDER — PREDNISONE 20 MG PO TABS
20.0000 mg | ORAL_TABLET | Freq: Every day | ORAL | 0 refills | Status: DC
Start: 2021-06-15 — End: 2021-06-30

## 2021-06-15 NOTE — Patient Instructions (Signed)
Nicole Rodriguez, thank you for joining Mar Daring, PA-C for today's virtual visit.  While this provider is not your primary care provider (PCP), if your PCP is located in our provider database this encounter information will be shared with them immediately following your visit.  Consent: (Patient) Nicole Rodriguez provided verbal consent for this virtual visit at the beginning of the encounter.  Current Medications:  Current Outpatient Medications:    predniSONE (DELTASONE) 20 MG tablet, Take 1 tablet (20 mg total) by mouth daily with breakfast., Disp: 5 tablet, Rfl: 0   albuterol (PROVENTIL) (2.5 MG/3ML) 0.083% nebulizer solution, Take 3 mLs (2.5 mg total) by nebulization every 6 (six) hours as needed for wheezing or shortness of breath., Disp: 75 mL, Rfl: 3   Albuterol Sulfate (PROAIR RESPICLICK) 301 (90 Base) MCG/ACT AEPB, Inhale 1-2 puffs into the lungs every 6 (six) hours as needed (shortness of breath)., Disp: , Rfl:    azithromycin (ZITHROMAX) 250 MG tablet, Take 2 tablets on day 1, then 1 tablet daily on days 2 through 5, Disp: 6 tablet, Rfl: 0   buPROPion (WELLBUTRIN SR) 200 MG 12 hr tablet, Take 1 tablet (200 mg total) by mouth 2 (two) times daily. Please take the second dose not later than 4 PM., Disp: 60 tablet, Rfl: 2   clonazePAM (KLONOPIN) 1 MG tablet, 1  qhs   1  prn  q day, Disp: 60 tablet, Rfl: 4   fluticasone (FLONASE) 50 MCG/ACT nasal spray, Place 1 spray into both nostrils daily., Disp: 16 g, Rfl: 5   levocetirizine (XYZAL) 5 MG tablet, TAKE 1 TABLET(5 MG) BY MOUTH EVERY EVENING, Disp: 30 tablet, Rfl: 5   montelukast (SINGULAIR) 10 MG tablet, Take 1 tablet (10 mg total) by mouth at bedtime., Disp: 30 tablet, Rfl: 5   pantoprazole (PROTONIX) 40 MG tablet, TAKE 1 TABLET(40 MG) BY MOUTH TWICE DAILY, Disp: 60 tablet, Rfl: 5   sodium chloride (OCEAN) 0.65 % SOLN nasal spray, Place 1 spray into both nostrils as needed for congestion., Disp: 15 mL, Rfl: 0   traZODone  (DESYREL) 50 MG tablet, Take 1 tablet (50 mg total) by mouth at bedtime as needed for sleep., Disp: 30 tablet, Rfl: 2   Medications ordered in this encounter:  Meds ordered this encounter  Medications   predniSONE (DELTASONE) 20 MG tablet    Sig: Take 1 tablet (20 mg total) by mouth daily with breakfast.    Dispense:  5 tablet    Refill:  0    Order Specific Question:   Supervising Provider    Answer:   Noemi Chapel [[id number]]     *If you need refills on other medications prior to your next appointment, please contact your pharmacy*  Follow-Up: Call back or seek an in-person evaluation if the symptoms worsen or if the condition fails to improve as anticipated.  Other Instructions Sinus Headache A sinus headache occurs when your sinuses become clogged or swollen. Sinuses are air-filled spaces in your skull that are behind the bones of your face and forehead. Sinus headaches can range from mild to severe. What are the causes? A sinus headache can result from various conditions that affect the sinuses. Common causes include: Colds. Sinus infections. Allergies. Many people confuse sinus headaches with migraines or tension headaches because both headaches can cause facial pain and nasal symptoms. What are the signs or symptoms? The main symptom of this condition is a headache that may feel like pain or pressure in your face, forehead,  ears, or upper teeth. People who have a sinus headache often have other symptoms, such as: Congested or runny nose. Fever. Inability to smell. Weather changes can make symptoms worse. How is this diagnosed? This condition may be diagnosed based on: A physical exam and medical history. Imaging tests, such as a CT scan or MRI, to check for problems with the sinuses. Examination of the sinuses using a thin tool with a camera that is inserted through your nose (endoscopy). How is this treated? Treatment for this condition depends on the cause. Sinus pain  that is caused by a sinus infection may be treated with antibiotic medicine. Sinus pain that is caused by congestion may be helped by rinsing out (flushing) the nose and sinuses with saline solution. Sinus pain that is caused by allergies may be helped by allergy medicines (antihistamines) and medicated nasal sprays. Sinus surgery may be needed in some cases if other treatments do not help. Follow these instructions at home: General instructions If directed: Apply a warm, moist washcloth to your face to help relieve pain. Use a nasal saline wash. Hydrate and humidify Drink enough water to keep your urine clear or pale yellow. Staying hydrated will help to thin your mucus. Use a cool mist humidifier to keep the humidity level in your home above 50%. Inhale steam for 10-15 minutes, 3-4 times a day or as told by your health care provider. You can do this in the bathroom while a hot shower is running. Limit your exposure to cool or dry air. Medicines  Take over-the-counter and prescription medicines only as told by your health care provider. If you were prescribed an antibiotic medicine, take it as told by your health care provider. Do not stop taking the antibiotic even if you start to feel better. If you have congestion, use a nasal spray to help lessen pressure. Contact a health care provider if: You have a headache more than one time a week. You have sensitivity to light or sound. You develop a fever. You feel nauseous or you vomit. Your headaches do not get better with treatment. Many people think that they have a sinus headache when they actually have a migraine or a tension headache. Get help right away if: You have vision problems. You have sudden, severe pain in your face or head. You have a seizure. You are confused. You have a stiff neck. Summary A sinus headache occurs when your sinuses become clogged or swollen. A sinus headache can result from various conditions that affect  the sinuses, such as a cold, a sinus infection, or an allergy. Treatment for this condition depends on the cause. It may include medicine, such as antibiotics or antihistamines. This information is not intended to replace advice given to you by your health care provider. Make sure you discuss any questions you have with your health care provider. Document Revised: 03/26/2020 Document Reviewed: 03/30/2020 Elsevier Patient Education  2022 [employer].    If you have been instructed to have an in-person evaluation today at a local Urgent Care facility, please use the link below. It will take you to a list of all of our available [facility], including address, phone number and hours of operation. Please do not delay care.  [facility]  If you or a family member do not have a primary care provider, use the link below to schedule a visit and establish care. When you choose a [facility] primary care physician or advanced practice  provider, you gain a long-term partner in health. Find a Primary Care Provider  Learn more about [facility]'s in-office and virtual care options: [facility]

## 2021-06-15 NOTE — Progress Notes (Signed)
Virtual Visit Consent   Nicole Rodriguez, you are scheduled for a virtual visit with a [facility] provider today.     Just as with appointments in the office, your consent must be obtained to participate.  Your consent will be active for this visit and any virtual visit you may have with one of our providers in the next 365 days.     If you have a MyChart account, a copy of this consent can be sent to you electronically.  All virtual visits are billed to your insurance company just like a traditional visit in the office.    As this is a virtual visit, video technology does not allow for your provider to perform a traditional examination.  This may limit your provider's ability to fully assess your condition.  If your provider identifies any concerns that need to be evaluated in person or the need to arrange testing (such as labs, EKG, etc.), we will make arrangements to do so.     Although advances in technology are sophisticated, we cannot ensure that it will always work on either your end or our end.  If the connection with a video visit is poor, the visit may have to be switched to a telephone visit.  With either a video or telephone visit, we are not always able to ensure that we have a secure connection.     I need to obtain your verbal consent now.   Are you willing to proceed with your visit today?    Nicole Rodriguez has provided verbal consent on 06/15/2021 for a virtual visit (video or telephone).   Mar Daring, PA-C   Date: 06/15/2021 1:34 PM   Virtual Visit via Video Note   I, Mar Daring, connected with  Nicole Rodriguez  ([id number], August 13, 1990) on 06/15/21 at  1:30 PM EST by a video-enabled telemedicine application and verified that I am speaking with the correct person using two identifiers.  Location: Patient: Virtual Visit Location Patient: Home Provider: Virtual Visit Location Provider: Home Office   I discussed the limitations of evaluation and management  by telemedicine and the availability of in person appointments. The patient expressed understanding and agreed to proceed.    History of Present Illness: Nicole Rodriguez is a 30 y.o. who identifies as a female who was assigned female at birth, and is being seen today for possible migraine headache. Was seen yesterday virtually and started on azithromycin for sinusitis. She reports headache has finally let up some this afternoon, but was still pretty persistent this morning. This caused her to miss work today as well, and she is requesting a work excuse.    Problems:  Patient Active Problem List   Diagnosis Date Noted   Intermittent palpitations 08/20/2020   Encounter to establish care 08/20/2020   Gastric polyp    GAD (generalized anxiety disorder) 07/19/2019   Social anxiety disorder 07/19/2019   Panic disorder 07/19/2019   Bipolar 2 disorder ([place]) 07/19/2019   Allergic rhinitis 08/19/2018   GERD (gastroesophageal reflux disease) 08/19/2018   Asthma 11/29/2017   Breast mass, right 03/18/2014   Numbness and tingling in both hands 01/26/2014    Allergies:  Allergies  Allergen Reactions   Amoxicillin Hives   Asa [Aspirin] Hives   Cefoxitin Itching and Rash   Sulfa Antibiotics Hives   Medications:  Current Outpatient Medications:    predniSONE (DELTASONE) 20 MG tablet, Take 1 tablet (20 mg total) by mouth daily with breakfast., Disp: 5 tablet, Rfl:  0   albuterol (PROVENTIL) (2.5 MG/3ML) 0.083% nebulizer solution, Take 3 mLs (2.5 mg total) by nebulization every 6 (six) hours as needed for wheezing or shortness of breath., Disp: 75 mL, Rfl: 3   Albuterol Sulfate (PROAIR RESPICLICK) 801 (90 Base) MCG/ACT AEPB, Inhale 1-2 puffs into the lungs every 6 (six) hours as needed (shortness of breath)., Disp: , Rfl:    azithromycin (ZITHROMAX) 250 MG tablet, Take 2 tablets on day 1, then 1 tablet daily on days 2 through 5, Disp: 6 tablet, Rfl: 0   buPROPion (WELLBUTRIN SR) 200 MG 12 hr tablet,  Take 1 tablet (200 mg total) by mouth 2 (two) times daily. Please take the second dose not later than 4 PM., Disp: 60 tablet, Rfl: 2   clonazePAM (KLONOPIN) 1 MG tablet, 1  qhs   1  prn  q day, Disp: 60 tablet, Rfl: 4   fluticasone (FLONASE) 50 MCG/ACT nasal spray, Place 1 spray into both nostrils daily., Disp: 16 g, Rfl: 5   levocetirizine (XYZAL) 5 MG tablet, TAKE 1 TABLET(5 MG) BY MOUTH EVERY EVENING, Disp: 30 tablet, Rfl: 5   montelukast (SINGULAIR) 10 MG tablet, Take 1 tablet (10 mg total) by mouth at bedtime., Disp: 30 tablet, Rfl: 5   pantoprazole (PROTONIX) 40 MG tablet, TAKE 1 TABLET(40 MG) BY MOUTH TWICE DAILY, Disp: 60 tablet, Rfl: 5   sodium chloride (OCEAN) 0.65 % SOLN nasal spray, Place 1 spray into both nostrils as needed for congestion., Disp: 15 mL, Rfl: 0   traZODone (DESYREL) 50 MG tablet, Take 1 tablet (50 mg total) by mouth at bedtime as needed for sleep., Disp: 30 tablet, Rfl: 2  Observations/Objective: Patient is well-developed, well-nourished in no acute distress.  Resting comfortably at home.  Head is normocephalic, atraumatic.  No labored breathing.  Speech is clear and coherent with logical content.  Patient is alert and oriented at baseline.    Assessment and Plan: 1. Acute nonintractable headache, unspecified headache type - predniSONE (DELTASONE) 20 MG tablet; Take 1 tablet (20 mg total) by mouth daily with breakfast.  Dispense: 5 tablet; Refill: 0  - Will add prednisone for headache - Contine Zpak until completed - Push fluids - Rest - Seek in person evaluation if not improving or if symptoms worsen  Follow Up Instructions: I discussed the assessment and treatment plan with the patient. The patient was provided an opportunity to ask questions and all were answered. The patient agreed with the plan and demonstrated an understanding of the instructions.  A copy of instructions were sent to the patient via MyChart unless otherwise noted below.    The  patient was advised to call back or seek an in-person evaluation if the symptoms worsen or if the condition fails to improve as anticipated.  Time:  I spent 8 minutes with the patient via telehealth technology discussing the above problems/concerns.    Mar Daring, PA-C

## 2021-06-30 ENCOUNTER — Encounter: Payer: Self-pay | Admitting: Internal Medicine

## 2021-06-30 ENCOUNTER — Ambulatory Visit (INDEPENDENT_AMBULATORY_CARE_PROVIDER_SITE_OTHER): Payer: BLUE CROSS/BLUE SHIELD | Admitting: Internal Medicine

## 2021-06-30 ENCOUNTER — Other Ambulatory Visit: Payer: Self-pay

## 2021-06-30 VITALS — BP 128/76 | HR 109 | Resp 18 | Ht 62.0 in | Wt 217.0 lb

## 2021-06-30 DIAGNOSIS — J301 Allergic rhinitis due to pollen: Secondary | ICD-10-CM | POA: Diagnosis not present

## 2021-06-30 DIAGNOSIS — J0111 Acute recurrent frontal sinusitis: Secondary | ICD-10-CM | POA: Diagnosis not present

## 2021-06-30 DIAGNOSIS — J4520 Mild intermittent asthma, uncomplicated: Secondary | ICD-10-CM

## 2021-06-30 DIAGNOSIS — Z0001 Encounter for general adult medical examination with abnormal findings: Secondary | ICD-10-CM

## 2021-06-30 DIAGNOSIS — Z1159 Encounter for screening for other viral diseases: Secondary | ICD-10-CM

## 2021-06-30 DIAGNOSIS — E559 Vitamin D deficiency, unspecified: Secondary | ICD-10-CM

## 2021-06-30 DIAGNOSIS — Z114 Encounter for screening for human immunodeficiency virus [HIV]: Secondary | ICD-10-CM

## 2021-06-30 DIAGNOSIS — R059 Cough, unspecified: Secondary | ICD-10-CM | POA: Diagnosis not present

## 2021-06-30 DIAGNOSIS — Z0000 Encounter for general adult medical examination without abnormal findings: Secondary | ICD-10-CM

## 2021-06-30 DIAGNOSIS — F3181 Bipolar II disorder: Secondary | ICD-10-CM

## 2021-06-30 LAB — POCT INFLUENZA A/B
Influenza A, POC: NEGATIVE
Influenza B, POC: NEGATIVE

## 2021-06-30 MED ORDER — BENZONATATE 100 MG PO CAPS: 100.0000 mg | ORAL_CAPSULE | Freq: Two times a day (BID) | ORAL | 0 refills | Status: DC | PRN

## 2021-06-30 MED ORDER — MOXIFLOXACIN HCL 400 MG PO TABS: 400.0000 mg | ORAL_TABLET | Freq: Every day | ORAL | 0 refills | Status: DC

## 2021-06-30 MED ORDER — ALBUTEROL SULFATE HFA 108 (90 BASE) MCG/ACT IN AERS: 2.0000 | INHALATION_SPRAY | Freq: Four times a day (QID) | RESPIRATORY_TRACT | 5 refills | Status: DC | PRN

## 2021-06-30 NOTE — Assessment & Plan Note (Signed)
On Wellbutrin Trazodone PRN for insomnia Klonopin PRN for anxiety  Follows up with Psychiatry -needs a follow-up visit

## 2021-06-30 NOTE — Progress Notes (Signed)
Established Patient Office Visit  Subjective:  Patient ID: Shyia Fillingim, female    DOB: 01/21/1991  Age: 30 y.o. MRN: [id number]  CC:  Chief Complaint  Patient presents with   Headache    Pt has been having headaches since thanksgiving tylenol or ibuprofen dont help also started yesterday 06-29-21 pt has cough sore throat runny nose no covid test     HPI Phylisha Dix is a 30 y.o. female with past medical history of bipolar disorder, anxiety/panic disorder, asthma, GERD, gastric and colonic polyp and obesity who presents for f/u of her chronic medical conditions.  She has been having nasal congestion, postnasal drip, cough, sore throat and sinus pressure related headache for about a month now, which have been worse on intermittent basis.  She has had evisit and was given Zpac by online providers.  Her symptoms have been persistent despite taking Zpac.  She has been taking Xyzal and Singulair for allergies as well.  She also uses Flonase.  She denies any fever, chills, chest pain currently.  She also has been using albuterol more frequently for dyspnea.  She is on Wellbutrin, Klonopin and trazodone currently, but states that she needs a follow-up with her Psychiatry.  She denies any anhedonia, SI or HI currently.  She has not had flu vaccine yet.  Past Medical History:  Diagnosis Date   Asthma    Bipolar disorder ([place])    Generalized anxiety disorder    GERD (gastroesophageal reflux disease)    [employer] 08/17/2020   IBS (irritable bowel syndrome)    Plantar fasciitis 10/15/2018   Rectal bleeding 09/24/2019   Social anxiety disorder     Past Surgical History:  Procedure Laterality Date   APPENDECTOMY     BREAST LUMPECTOMY     COLONOSCOPY WITH PROPOFOL N/A 12/03/2019   Procedure: COLONOSCOPY WITH PROPOFOL;  Surgeon: Virgel Manifold, MD;  Location: [facility];  Service: Endoscopy;  Laterality: N/A;   ESOPHAGOGASTRODUODENOSCOPY (EGD) WITH PROPOFOL N/A 12/03/2019    Procedure: ESOPHAGOGASTRODUODENOSCOPY (EGD) WITH PROPOFOL;  Surgeon: Virgel Manifold, MD;  Location: [facility] ENDOSCOPY;  Service: Endoscopy;  Laterality: N/A;   FOOT SURGERY      Family History  Problem Relation Age of Onset   Asthma Father    Diabetes Other    Heart failure Other    Anxiety disorder Maternal Grandmother    Depression Maternal Grandmother    Crohn's disease Sister        half sister   Diverticulitis Paternal Grandmother    Colon cancer Neg Hx    Celiac disease Neg Hx     Social History   Socioeconomic History   Marital status: Married    Spouse name: Not on file   Number of children: 1   Years of education: Not on file   Highest education level: Not on file  Occupational History   Not on file  Tobacco Use   Smoking status: Never   Smokeless tobacco: Never  Vaping Use   Vaping Use: Never used  Substance and Sexual Activity   Alcohol use: Yes    Comment: socially    Drug use: No   Sexual activity: Yes    Birth control/protection: None  Other Topics Concern   Not on file  Social History Narrative   Daughter 27 years old.   Social Determinants of Health   Financial Resource Strain: Not on file  Food Insecurity: Not on file  Transportation Needs: Not on file  Physical Activity: Not  on file  Stress: Not on file  Social Connections: Not on file  Intimate Partner Violence: Not on file    Outpatient Medications Prior to Visit  Medication Sig Dispense Refill   albuterol (PROVENTIL) (2.5 MG/3ML) 0.083% nebulizer solution Take 3 mLs (2.5 mg total) by nebulization every 6 (six) hours as needed for wheezing or shortness of breath. 75 mL 3   buPROPion (WELLBUTRIN SR) 200 MG 12 hr tablet Take 1 tablet (200 mg total) by mouth 2 (two) times daily. Please take the second dose not later than 4 PM. 60 tablet 2   clonazePAM (KLONOPIN) 1 MG tablet 1  qhs   1  prn  q day 60 tablet 4   fluticasone (FLONASE) 50 MCG/ACT nasal spray Place 1 spray into both nostrils  daily. 16 g 5   levocetirizine (XYZAL) 5 MG tablet TAKE 1 TABLET(5 MG) BY MOUTH EVERY EVENING 30 tablet 5   montelukast (SINGULAIR) 10 MG tablet Take 1 tablet (10 mg total) by mouth at bedtime. 30 tablet 5   pantoprazole (PROTONIX) 40 MG tablet TAKE 1 TABLET(40 MG) BY MOUTH TWICE DAILY 60 tablet 5   sodium chloride (OCEAN) 0.65 % SOLN nasal spray Place 1 spray into both nostrils as needed for congestion. 15 mL 0   traZODone (DESYREL) 50 MG tablet Take 1 tablet (50 mg total) by mouth at bedtime as needed for sleep. 30 tablet 2   Albuterol Sulfate (PROAIR RESPICLICK) 263 (90 Base) MCG/ACT AEPB Inhale 1-2 puffs into the lungs every 6 (six) hours as needed (shortness of breath).     predniSONE (DELTASONE) 20 MG tablet Take 1 tablet (20 mg total) by mouth daily with breakfast. 5 tablet 0   No facility-administered medications prior to visit.    Allergies  Allergen Reactions   Amoxicillin Hives   Asa [Aspirin] Hives   Cefoxitin Itching and Rash   Sulfa Antibiotics Hives    ROS Review of Systems  Constitutional:  Negative for chills and fever.  HENT:  Positive for congestion, postnasal drip, rhinorrhea, sinus pressure, sinus pain and sore throat.   Respiratory:  Positive for cough. Negative for shortness of breath.   Cardiovascular:  Negative for chest pain and palpitations.  Gastrointestinal:  Negative for diarrhea, nausea and vomiting.  Genitourinary:  Negative for dysuria and hematuria.  Musculoskeletal:  Negative for neck pain and neck stiffness.  Skin:  Negative for rash.  Neurological:  Negative for dizziness.  Psychiatric/Behavioral:  Negative for agitation and behavioral problems.      Objective:    Physical Exam Vitals reviewed.  Constitutional:      General: She is not in acute distress.    Appearance: She is obese. She is not diaphoretic.  HENT:     Head: Normocephalic and atraumatic.     Nose: Congestion present.     Right Sinus: Maxillary sinus tenderness and frontal  sinus tenderness present.     Left Sinus: Maxillary sinus tenderness and frontal sinus tenderness present.     Mouth/Throat:     Mouth: Mucous membranes are moist.  Eyes:     General: No scleral icterus.    Extraocular Movements: Extraocular movements intact.     Pupils: Pupils are equal, round, and reactive to light.  Cardiovascular:     Rate and Rhythm: Normal rate and regular rhythm.     Pulses: Normal pulses.     Heart sounds: Normal heart sounds. No murmur heard. Pulmonary:     Breath sounds: Normal breath sounds.  No wheezing or rales.  Musculoskeletal:     Cervical back: Neck supple. No tenderness.     Right lower leg: No edema.     Left lower leg: No edema.  Skin:    General: Skin is warm.     Findings: No rash.  Neurological:     General: No focal deficit present.     Mental Status: She is alert and oriented to person, place, and time.  Psychiatric:        Mood and Affect: Mood normal.        Behavior: Behavior normal.    BP 128/76 (BP Location: Left Arm, Patient Position: Sitting, Cuff Size: Normal)    Pulse (!) 109    Resp 18    Ht 5\' 2"  (1.575 m)    Wt 217 lb 0.6 oz (98.4 kg)    SpO2 97%    BMI 39.70 kg/m  Wt Readings from Last 3 Encounters:  06/30/21 217 lb 0.6 oz (98.4 kg)  09/06/20 200 lb (90.7 kg)  08/20/20 203 lb 1.9 oz (92.1 kg)    Lab Results  Component Value Date   TSH 1.500 09/14/2016   Lab Results  Component Value Date   WBC 10.5 09/06/2020   HGB 13.2 09/06/2020   HCT 39.9 09/06/2020   MCV 89.5 09/06/2020   PLT 305 09/06/2020   Lab Results  Component Value Date   NA 136 09/06/2020   K 3.8 09/06/2020   CO2 25 09/06/2020   GLUCOSE 93 09/06/2020   BUN 9 09/06/2020   CREATININE 0.87 09/06/2020   BILITOT 0.6 09/21/2019   ALKPHOS 56 09/21/2019   AST 16 09/21/2019   ALT 17 09/21/2019   PROT 7.3 09/21/2019   ALBUMIN 4.3 09/21/2019   CALCIUM 8.8 (L) 09/06/2020   ANIONGAP 9 09/06/2020   Lab Results  Component Value Date   CHOL 159  02/01/2017   Lab Results  Component Value Date   HDL 44 02/01/2017   Lab Results  Component Value Date   LDLCALC 98 02/01/2017   Lab Results  Component Value Date   TRIG 85 02/01/2017   Lab Results  Component Value Date   CHOLHDL 3.6 02/01/2017   Lab Results  Component Value Date   HGBA1C 5.2 02/01/2017      Assessment & Plan:   Problem List Items Addressed This Visit       Acute recurrent frontal sinusitis    -  Primary Persistent symptoms despite taking azithromycin Has been taking Singulair and Xyzal for allergies Uses Flonase for allergies Started Moxifloxacin as she has resistant symptoms Advised to use humidifier/vaporizer at nighttime  Relevant Medications  moxifloxacin (AVELOX) 400 MG tablet  benzonatate (TESSALON) 100 MG capsule    Respiratory   Asthma    Uses albuterol as needed Has been using it more frequently recently during cold weather      Relevant Medications   albuterol (VENTOLIN HFA) 108 (90 Base) MCG/ACT inhaler   Allergic rhinitis    Uncontrolled Uses Flonase On Xyzal 5 mg QD and Singulair 10 mg QD Referred to Allergy specialist as she has chronic sinusitis      Relevant Orders   Ambulatory referral to Allergy     Other   Bipolar 2 disorder ([place])    On Wellbutrin Trazodone PRN for insomnia Klonopin PRN for anxiety  Follows up with Psychiatry -needs a follow-up visit      Other Visit Diagnoses     Need for hepatitis C screening  test       Relevant Orders   Hepatitis C Antibody   Encounter for screening for HIV       Relevant Orders   HIV antibody (with reflex)   Vitamin D deficiency       Relevant Orders   VITAMIN D 25 Hydroxy (Vit-D Deficiency, Fractures)   Cough, unspecified type       Relevant Medications   benzonatate (TESSALON) 100 MG capsule   Other Relevant Orders   POCT Influenza A/B (Completed)     Meds ordered this encounter  Medications   moxifloxacin (AVELOX) 400 MG tablet    Sig: Take 1 tablet  (400 mg total) by mouth daily.    Dispense:  7 tablet    Refill:  0   benzonatate (TESSALON) 100 MG capsule    Sig: Take 1 capsule (100 mg total) by mouth 2 (two) times daily as needed for cough.    Dispense:  30 capsule    Refill:  0   albuterol (VENTOLIN HFA) 108 (90 Base) MCG/ACT inhaler    Sig: Inhale 2 puffs into the lungs every 6 (six) hours as needed for wheezing or shortness of breath.    Dispense:  8 g    Refill:  5    Okay to substitute to generic/formulary Albuterol.    Follow-up: Return in about 6 months (around 12/29/2021) for Annual physical.    Lindell Spar, MD

## 2021-06-30 NOTE — Assessment & Plan Note (Signed)
Uses albuterol as needed Has been using it more frequently recently during cold weather

## 2021-06-30 NOTE — Assessment & Plan Note (Signed)
Uncontrolled Uses Flonase On Xyzal 5 mg QD and Singulair 10 mg QD Referred to Allergy specialist as she has chronic sinusitis

## 2021-06-30 NOTE — Patient Instructions (Signed)
Please take Moxifloxacin for sinusitis. Continue to take Singulair and use Flonase.  Please use humidifier/vaporizer at nighttime.  Please continue taking other medications as prescribed.

## 2021-07-01 ENCOUNTER — Encounter: Payer: Self-pay | Admitting: Internal Medicine

## 2021-07-04 ENCOUNTER — Encounter: Payer: Self-pay | Admitting: *Deleted

## 2021-07-05 ENCOUNTER — Ambulatory Visit: Payer: Self-pay | Admitting: Internal Medicine

## 2021-07-06 ENCOUNTER — Other Ambulatory Visit: Payer: Self-pay | Admitting: Internal Medicine

## 2021-07-06 ENCOUNTER — Encounter: Payer: Self-pay | Admitting: Internal Medicine

## 2021-07-06 DIAGNOSIS — J301 Allergic rhinitis due to pollen: Secondary | ICD-10-CM

## 2021-07-06 MED ORDER — METHYLPREDNISOLONE 4 MG PO TBPK: ORAL_TABLET | ORAL | 0 refills | Status: DC

## 2021-07-06 NOTE — Telephone Encounter (Signed)
Pt scheduled 08-12-21 with allergy asthma let her know medication was at pharmacy with verbal understanding

## 2021-07-08 ENCOUNTER — Other Ambulatory Visit: Payer: Self-pay | Admitting: Internal Medicine

## 2021-07-13 ENCOUNTER — Encounter: Payer: Self-pay | Admitting: Allergy & Immunology

## 2021-07-13 ENCOUNTER — Ambulatory Visit: Payer: BLUE CROSS/BLUE SHIELD | Admitting: Allergy & Immunology

## 2021-07-13 ENCOUNTER — Other Ambulatory Visit: Payer: Self-pay

## 2021-07-13 VITALS — BP 150/90 | HR 122 | Temp 97.3°F | Resp 24 | Ht 62.0 in | Wt 221.4 lb

## 2021-07-13 DIAGNOSIS — J310 Chronic rhinitis: Secondary | ICD-10-CM | POA: Diagnosis not present

## 2021-07-13 DIAGNOSIS — J4540 Moderate persistent asthma, uncomplicated: Secondary | ICD-10-CM | POA: Diagnosis not present

## 2021-07-13 MED ORDER — FLUTICASONE PROPIONATE 50 MCG/ACT NA SUSP: 1.0000 | Freq: Every day | NASAL | 1 refills | Status: DC

## 2021-07-13 MED ORDER — AZELASTINE HCL 0.1 % NA SOLN: NASAL | 1 refills | Status: DC

## 2021-07-13 MED ORDER — AIRDUO DIGIHALER 113-14 MCG/ACT IN AEPB: 1.0000 | INHALATION_SPRAY | Freq: Two times a day (BID) | RESPIRATORY_TRACT | 1 refills | Status: DC

## 2021-07-13 NOTE — Progress Notes (Signed)
NEW PATIENT  Date of Service/Encounter:  07/13/21  Consult requested by: Nicole Spar, MD   Assessment:   Moderate persistent asthma, uncomplicated - with AEC 761 March 2022  Chronic rhinitis - with minimally reactive testing to indoor and outdoor molds  Plan/Recommendations:   1. Moderate persistent asthma, uncomplicated - Lung testing looked normal and one part of your spiro improved with the albuterol treatment. - This points towards asthma, but does not definitively prove it.  - We going to start you on a controller medication called AirDuo (contains a long-acting albuterol and an inhaled steroid) - Sample provided. - Daily controller medication(s):  AirDuo 113/1mcg one puff twice daily - Prior to physical activity: albuterol 2 puffs 10-15 minutes before physical activity. - Rescue medications: albuterol 4 puffs every 4-6 hours as needed - Asthma control goals:  * Full participation in all desired activities (may need albuterol before activity) * Albuterol use two time or less a week on average (not counting use with activity) * Cough interfering with sleep two time or less a month * Oral steroids no more than once a year * No hospitalizations  2. Chronic rhinitis - Testing today showed: grasses. - Copy of test results provided.  - Avoidance measures provided. - Stop taking:  - Continue with: Xyzal (levocetirizine) 5mg  tablet once daily, Singulair (montelukast) 10mg  daily, and Flonase (fluticasone) one spray per nostril daily - Start taking: Astelin (azelastine) 2 sprays per nostril 1-2 times daily as needed - You can use an extra dose of the antihistamine, if needed, for breakthrough symptoms.  - Consider nasal saline rinses 1-2 times daily to remove allergens from the nasal cavities as well as help with mucous clearance (this is especially helpful to do before the nasal sprays are given)  3. Return in about 4 weeks (around 08/10/2021).   This note in its entirety  was forwarded to the Provider who requested this consultation.  Subjective:   Nicole Rodriguez is a 31 y.o. female presenting today for evaluation of  Chief Complaint  Patient presents with   Asthma   Allergy Testing    Environmental Patient states she has no problems with foods    Nicole Rodriguez has a history of the following: Patient Active Problem List   Diagnosis Date Noted   Intermittent palpitations 08/20/2020   Gastric polyp    GAD (generalized anxiety disorder) 07/19/2019   Social anxiety disorder 07/19/2019   Panic disorder 07/19/2019   Bipolar 2 disorder ([place]) 07/19/2019   Allergic rhinitis 08/19/2018   GERD (gastroesophageal reflux disease) 08/19/2018   Asthma 11/29/2017   Breast mass, right 03/18/2014   Numbness and tingling in both hands 01/26/2014    History obtained from: chart review and patient.  Nicole Rodriguez was referred by Nicole Spar, MD.     Nicole Rodriguez is a 31 y.o. female presenting for an evaluation of allergies and asthma .   Asthma/Respiratory Symptom History: She has a chronic cough that has been ongoing since right before Thanksgiving. She does report tightness in her chest. She uses this around once a day or every other day. She has decreased her activity because of her symptoms.   Allergic Rhinitis Symptom History: She has had chronic sinusitis for years. She has been on medications since age 46 or so. She has been on many medications including Xyzal and Singulair and Flonase. She has not been on other nose sprays. She is using all of these daily and continues to have issues.  She  has four dogs in the home. She has not noticed that her symptoms worsened with those. She has gotten sinusitis around 2-3 times per year. Cold weather is bad and the beginning of spring.  She reports a lot of headaches since before Thanksgiving. She has had prednisone twice and antibiotics twice since the last visit.  GERD Symptom History: She is on Protonix daily. She  has been on this for a number of years, around 2 years or so.   She has a 52yo daughter (birthed by her wife) who does not have allergies. She is on [employer]. She is kindergarten.   Otherwise, there is no history of other atopic diseases, including food allergies, drug allergies, stinging insect allergies, eczema, urticaria, or contact dermatitis. There is no significant infectious history. Vaccinations are up to date.    Past Medical History: Patient Active Problem List   Diagnosis Date Noted   Intermittent palpitations 08/20/2020   Gastric polyp    GAD (generalized anxiety disorder) 07/19/2019   Social anxiety disorder 07/19/2019   Panic disorder 07/19/2019   Bipolar 2 disorder ([place]) 07/19/2019   Allergic rhinitis 08/19/2018   GERD (gastroesophageal reflux disease) 08/19/2018   Asthma 11/29/2017   Breast mass, right 03/18/2014   Numbness and tingling in both hands 01/26/2014    Medication List:  Allergies as of 07/13/2021       Reactions   Amoxicillin Hives   Asa [aspirin] Hives   Cefoxitin Itching, Rash   Sulfa Antibiotics Hives        Medication List        Accurate as of July 13, 2021 12:14 PM. If you have any questions, ask your nurse or doctor.          STOP taking these medications    methylPREDNISolone 4 MG Tbpk tablet Commonly known as: MEDROL DOSEPAK Stopped by: Nicole Shaggy, MD   moxifloxacin 400 MG tablet Commonly known as: AVELOX Stopped by: Nicole Shaggy, MD   sodium chloride 0.65 % Soln nasal spray Commonly known as: OCEAN Stopped by: Nicole Shaggy, MD       TAKE these medications    AirDuo Digihaler 113-14 MCG/ACT Aepb Generic drug: Fluticasone-Salmeterol(sensor) Inhale 1 puff into the lungs 2 (two) times daily. Started by: Nicole Shaggy, MD   albuterol (2.5 MG/3ML) 0.083% nebulizer solution Commonly known as: PROVENTIL Take 3 mLs (2.5 mg total) by nebulization every 6 (six) hours as needed  for wheezing or shortness of breath.   albuterol 108 (90 Base) MCG/ACT inhaler Commonly known as: VENTOLIN HFA Inhale 2 puffs into the lungs every 6 (six) hours as needed for wheezing or shortness of breath.   azelastine 0.1 % nasal spray Commonly known as: ASTELIN 2 sprays per nostril 1-2 times daily as needed. Started by: Nicole Shaggy, MD   benzonatate 100 MG capsule Commonly known as: TESSALON Take 1 capsule (100 mg total) by mouth 2 (two) times daily as needed for cough.   buPROPion 200 MG 12 hr tablet Commonly known as: WELLBUTRIN SR Take 1 tablet (200 mg total) by mouth 2 (two) times daily. Please take the second dose not later than 4 PM.   clonazePAM 1 MG tablet Commonly known as: KlonoPIN 1  qhs   1  prn  q day   fluticasone 50 MCG/ACT nasal spray Commonly known as: FLONASE Place 1 spray into both nostrils daily. What changed: Another medication with the same name was added. Make sure you understand how and  when to take each. Changed by: Nicole Shaggy, MD   fluticasone 50 MCG/ACT nasal spray Commonly known as: FLONASE Place 1 spray into both nostrils daily. What changed: You were already taking a medication with the same name, and this prescription was added. Make sure you understand how and when to take each. Changed by: Nicole Shaggy, MD   levocetirizine 5 MG tablet Commonly known as: XYZAL TAKE 1 TABLET EVERY EVENING   montelukast 10 MG tablet Commonly known as: SINGULAIR TAKE 1 TABLET AT BEDTIME   pantoprazole 40 MG tablet Commonly known as: PROTONIX TAKE 1 TABLET(40 MG) BY MOUTH TWICE DAILY   traZODone 50 MG tablet Commonly known as: DESYREL Take 1 tablet (50 mg total) by mouth at bedtime as needed for sleep.        Birth History: non-contributory  Developmental History: non-contributory  Past Surgical History: Past Surgical History:  Procedure Laterality Date   APPENDECTOMY     BREAST LUMPECTOMY     COLONOSCOPY WITH  PROPOFOL N/A 12/03/2019   Procedure: COLONOSCOPY WITH PROPOFOL;  Surgeon: Virgel Manifold, MD;  Location: [facility] ENDOSCOPY;  Service: Endoscopy;  Laterality: N/A;   ESOPHAGOGASTRODUODENOSCOPY (EGD) WITH PROPOFOL N/A 12/03/2019   Procedure: ESOPHAGOGASTRODUODENOSCOPY (EGD) WITH PROPOFOL;  Surgeon: Virgel Manifold, MD;  Location: [facility] ENDOSCOPY;  Service: Endoscopy;  Laterality: N/A;   FOOT SURGERY       Family History: Family History  Problem Relation Age of Onset   Allergic rhinitis Father    Asthma Father    Crohn's disease Sister        half sister   Anxiety disorder Maternal Grandmother    Depression Maternal Grandmother    Diverticulitis Paternal Grandmother    Diabetes Other    Heart failure Other    Colon cancer Neg Hx    Celiac disease Neg Hx      Social History: Nicole Rodriguez lives at home with her wife and 63-year-old daughter.  They live in a house that is 30 years old.  There is hardwood throughout the home.  They have electric heating and central cooling.  There are dust mite covers on the bed, but not the pillows.  There is no tobacco exposure.  She previously worked at [employer].  She currently works at [employer] right now. This recently started renovating the old [employer] and started at the end of 2019. There are around 100 people working to get this ready. Now the anticipated opening is June 2023. She has been there for 3 months. 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    Review of Systems  Constitutional: Negative.  Negative for chills, fever, malaise/fatigue and weight loss.  HENT: Negative.  Negative for congestion, ear discharge and ear pain.   Eyes:  Negative for pain, discharge and redness.  Respiratory:  Positive for cough. Negative for sputum production, shortness of breath and wheezing.   Cardiovascular: Negative.  Negative for chest pain and palpitations.  Gastrointestinal:  Negative for abdominal pain, constipation,  diarrhea, heartburn, nausea and vomiting.  Skin: Negative.  Negative for itching and rash.  Neurological:  Negative for dizziness and headaches.  Endo/Heme/Allergies:  Negative for environmental allergies. Does not bruise/bleed easily.      Objective:   Blood pressure (!) 150/90, pulse (!) 122, temperature (!) 97.3 F (36.3 C), resp. rate (!) 24, height 5\' 2"  (1.575 m), weight 221 lb 6.4 oz (100.4 kg), SpO2 98 %. Body mass index is 40.49 kg/m.  Physical Exam:   Physical Exam Vitals reviewed.  Constitutional:      Appearance: She is well-developed.     Comments: Very pleasant and talkative.  HENT:     Head: Normocephalic and atraumatic.     Right Ear: Tympanic membrane, ear canal and external ear normal. No drainage, swelling or tenderness. Tympanic membrane is not injected, scarred, erythematous, retracted or bulging.     Left Ear: Tympanic membrane, ear canal and external ear normal. No drainage, swelling or tenderness. Tympanic membrane is not injected, scarred, erythematous, retracted or bulging.     Nose: No nasal deformity, septal deviation, mucosal edema or rhinorrhea.     Right Turbinates: Enlarged, swollen and pale.     Left Turbinates: Enlarged, swollen and pale.     Right Sinus: No maxillary sinus tenderness or frontal sinus tenderness.     Left Sinus: No maxillary sinus tenderness or frontal sinus tenderness.     Comments: No nasal polyps.    Mouth/Throat:     Lips: Pink.     Mouth: Mucous membranes are not pale and not dry.     Pharynx: Uvula midline.     Comments: Cobblestoning present.  Eyes:     General:        Right eye: No discharge.        Left eye: No discharge.     Conjunctiva/sclera: Conjunctivae normal.     Right eye: Right conjunctiva is not injected. No chemosis.    Left eye: Left conjunctiva is not injected. No chemosis.    Pupils: Pupils are equal, round, and reactive to light.  Cardiovascular:     Rate and Rhythm: Normal rate and regular  rhythm.     Heart sounds: Normal heart sounds.  Pulmonary:     Effort: Pulmonary effort is normal. No tachypnea, accessory muscle usage or respiratory distress.     Breath sounds: Normal breath sounds. No wheezing, rhonchi or rales.     Comments: Air well in all lung fields.  No increased work of breathing. Chest:     Chest wall: No tenderness.  Abdominal:     Tenderness: There is no abdominal tenderness. There is no guarding or rebound.  Lymphadenopathy:     Head:     Right side of head: No submandibular, tonsillar or occipital adenopathy.     Left side of head: No submandibular, tonsillar or occipital adenopathy.     Cervical: No cervical adenopathy.  Skin:    General: Skin is warm.     Capillary Refill: Capillary refill takes less than 2 seconds.     Coloration: Skin is not pale.     Findings: No abrasion, erythema, petechiae or rash. Rash is not papular, urticarial or vesicular.     Comments: No eczematous or urticarial lesions noted.  Neurological:     Mental Status: She is alert.  Psychiatric:        Behavior: Behavior is cooperative.     Diagnostic studies:   Spirometry: results normal (FEV1: 2.31/78%, FVC: 3.00/85%, FEV1/FVC: 77%).    Spirometry consistent with normal pattern. Albuterol four puffs via MDI treatment given in clinic with  improvement only in the FEF25-75%, otherwise no changes. But she did feel symptomatically better .  Allergy Studies:     Airborne Adult Perc - 07/13/21 1014     Time Antigen Placed 1014    Allergen Manufacturer Lavella Hammock    Location Back    Number of Test 59    Panel 1 Select  1. Control-Buffer 50% Glycerol Negative    2. Control-Histamine 1 mg/ml 2+    3. Albumin saline Negative    4. [place] Negative    5. [place] Negative    6. Johnson Negative    7. [place] Blue Negative    8. Meadow Fescue Negative    9. Perennial Rye Negative    10. Sweet Vernal Negative    11. Timothy Negative    12. Cocklebur Negative    13. Burweed  Marshelder Negative    14. Ragweed, short Negative    15. Ragweed, Giant Negative    16. Plantain,  English Negative    17. Lamb's Quarters Negative    18. Sheep Sorrell Negative    19. Rough Pigweed Negative    20. Marsh Elder, Rough Negative    21. Mugwort, Common Negative    22. Ash mix Negative    23. Birch mix Negative    24. Beech American Negative    25. Box, Elder Negative    26. Cedar, red Negative    27. Cottonwood, [place] Negative    28. Elm mix Negative    29. Hickory Negative    30. Maple mix Negative    31. Oak, [place] mix Negative    32. Pecan Pollen Negative    33. Pine mix Negative    34. Sycamore Eastern Negative    35. [place], Black Pollen Negative    36. Alternaria alternata Negative    37. Cladosporium Herbarum Negative    38. Aspergillus mix Negative    39. Penicillium mix Negative    40. Bipolaris sorokiniana (Helminthosporium) Negative    41. Drechslera spicifera (Curvularia) Negative    42. Mucor plumbeus Negative    43. Fusarium moniliforme Negative    44. Aureobasidium pullulans (pullulara) Negative    45. Rhizopus oryzae Negative    46. Botrytis cinera Negative    47. Epicoccum nigrum Negative    48. Phoma betae Negative    49. Candida Albicans Negative    50. Trichophyton mentagrophytes Negative    51. Mite, D Farinae  5,000 AU/ml Negative    52. Mite, D Pteronyssinus  5,000 AU/ml Negative    53. Cat Hair 10,000 BAU/ml Negative    54.  Dog Epithelia Negative    55. Mixed Feathers Negative    56. Horse Epithelia Negative    57. Cockroach, German Negative    58. Mouse Negative    59. Tobacco Leaf Negative             Intradermal - 07/13/21 1038     Time Antigen Placed 1045    Allergen Manufacturer Greer    Location Arm    Number of Test 15    Intradermal Select    Control Negative    [place] 1+    Johnson 1+    7 Grass Negative    Ragweed mix Negative    Weed mix Negative    Tree mix Negative    Mold 1 Negative    Mold 2  Negative    Mold 3 Negative    Mold 4 Negative    Cat Negative    Dog Negative    Cockroach Negative    Mite mix Negative             Allergy testing results were read and interpreted by myself, documented by clinical staff.         Salvatore Marvel, MD [facility]

## 2021-07-13 NOTE — Patient Instructions (Addendum)
1. Moderate persistent asthma, uncomplicated - Lung testing looked normal and one part of your spiro improved with the albuterol treatment. - This points towards asthma, but does not definitively prove it.  - We going to start you on a controller medication called AirDuo (contains a long-acting albuterol and an inhaled steroid) - Sample provided. - Daily controller medication(s):  AirDuo 113/23mcg one puff twice daily - Prior to physical activity: albuterol 2 puffs 10-15 minutes before physical activity. - Rescue medications: albuterol 4 puffs every 4-6 hours as needed - Asthma control goals:  * Full participation in all desired activities (may need albuterol before activity) * Albuterol use two time or less a week on average (not counting use with activity) * Cough interfering with sleep two time or less a month * Oral steroids no more than once a year * No hospitalizations  2. Chronic rhinitis - Testing today showed: grasses. - Copy of test results provided.  - Avoidance measures provided. - Stop taking:  - Continue with: Xyzal (levocetirizine) 5mg  tablet once daily, Singulair (montelukast) 10mg  daily, and Flonase (fluticasone) one spray per nostril daily - Start taking: Astelin (azelastine) 2 sprays per nostril 1-2 times daily as needed - You can use an extra dose of the antihistamine, if needed, for breakthrough symptoms.  - Consider nasal saline rinses 1-2 times daily to remove allergens from the nasal cavities as well as help with mucous clearance (this is especially helpful to do before the nasal sprays are given)  3. Return in about 4 weeks (around 08/10/2021).    Please inform us of any Emergency Department visits, hospitalizations, or changes in symptoms. Call us before going to the ED for breathing or allergy symptoms since we might be able to fit you in for a sick visit. Feel free to contact us anytime with any questions, problems, or concerns.  It was a pleasure to meet you  today!  Websites that have reliable patient information: 1. American Academy of Asthma, Allergy, and Immunology: www.aaaai.org 2. Food Allergy Research and Education (FARE): foodallergy.org 3. Mothers of Asthmatics: http://www.asthmacommunitynetwork.org 4. American College of Allergy, Asthma, and Immunology: www.acaai.org   COVID-19 Vaccine Information can be found at: [email or web address].[email or web address] For questions related to vaccine distribution or appointments, please email [email or web address] or call [phone].   We realize that you might be concerned about having an allergic reaction to the COVID19 vaccines. To help with that concern, WE ARE OFFERING THE COVID19 VACCINES IN OUR OFFICE! Ask the front desk for dates!     Like [place] on [employer] and Instagram for our latest updates!      A healthy democracy works best when [employer] participate! Make sure you are registered to vote! If you have moved or changed any of your contact information, you will need to get this updated before voting!  In some cases, you MAY be able to register to vote online: [email or web address]     Airborne Adult Perc - 07/13/21 1014     Time Antigen Placed 1014    Allergen Manufacturer Lavella Hammock    Location Back    Number of Test 59    Panel 1 Select    1. Control-Buffer 50% Glycerol Negative    2. Control-Histamine 1 mg/ml 2+    3. Albumin saline Negative    4. Olds Negative    5. [place] Negative    6. Johnson Negative    7. [place] Blue Negative    8. Meadow Fescue Negative    9. Perennial Rye  Negative    10. Sweet Vernal Negative    11. Timothy Negative    12. Cocklebur Negative    13. Burweed Marshelder Negative    14. Ragweed, short Negative    15. Ragweed, Giant Negative    16. Plantain,  English Negative    17. Lamb's Quarters Negative    18. Sheep Sorrell Negative    19. Rough Pigweed Negative    20.  Marsh Elder, Rough Negative    21. Mugwort, Common Negative    22. Ash mix Negative    23. Birch mix Negative    24. Beech American Negative    25. Box, Elder Negative    26. Cedar, red Negative    27. Cottonwood, [place] Negative    28. Elm mix Negative    29. Hickory Negative    30. Maple mix Negative    31. Oak, [place] mix Negative    32. Pecan Pollen Negative    33. Pine mix Negative    34. Sycamore Eastern Negative    35. [place], Black Pollen Negative    36. Alternaria alternata Negative    37. Cladosporium Herbarum Negative    38. Aspergillus mix Negative    39. Penicillium mix Negative    40. Bipolaris sorokiniana (Helminthosporium) Negative    41. Drechslera spicifera (Curvularia) Negative    42. Mucor plumbeus Negative    43. Fusarium moniliforme Negative    44. Aureobasidium pullulans (pullulara) Negative    45. Rhizopus oryzae Negative    46. Botrytis cinera Negative    47. Epicoccum nigrum Negative    48. Phoma betae Negative    49. Candida Albicans Negative    50. Trichophyton mentagrophytes Negative    51. Mite, D Farinae  5,000 AU/ml Negative    52. Mite, D Pteronyssinus  5,000 AU/ml Negative    53. Cat Hair 10,000 BAU/ml Negative    54.  Dog Epithelia Negative    55. Mixed Feathers Negative    56. Horse Epithelia Negative    57. Cockroach, German Negative    58. Mouse Negative    59. Tobacco Leaf Negative             Intradermal - 07/13/21 1038     Time Antigen Placed 1045    Allergen Manufacturer Greer    Location Arm    Number of Test 15    Intradermal Select    Control Negative    [place] 1+    Johnson 1+    7 Grass Negative    Ragweed mix Negative    Weed mix Negative    Tree mix Negative    Mold 1 Negative    Mold 2 Negative    Mold 3 Negative    Mold 4 Negative    Cat Negative    Dog Negative    Cockroach Negative    Mite mix Negative             Reducing Pollen Exposure  The American Academy of Allergy, Asthma and  Immunology suggests the following steps to reduce your exposure to pollen during allergy seasons.    Do not hang sheets or clothing out to dry; pollen may collect on these items. Do not mow lawns or spend time around freshly cut grass; mowing stirs up pollen. Keep windows closed at night.  Keep car windows closed while driving. Minimize morning activities outdoors, a time when pollen counts are usually at their highest. Stay indoors as much as possible  when pollen counts or humidity is high and on windy days when pollen tends to remain in the air longer. Use air conditioning when possible.  Many air conditioners have filters that trap the pollen spores. Use a HEPA room air filter to remove pollen form the indoor air you breathe.

## 2021-07-14 ENCOUNTER — Ambulatory Visit: Payer: Self-pay | Admitting: Obstetrics and Gynecology

## 2021-07-24 ENCOUNTER — Encounter: Payer: Self-pay | Admitting: Allergy & Immunology

## 2021-07-25 NOTE — Telephone Encounter (Signed)
You can stop azelastine and Xyzal for a few days to try to get that thick mucus to drain. Please continue with your inhaler AirDuo and call for any worsening symptoms, if you develop a fever, or questions. Thank you

## 2021-07-25 NOTE — Telephone Encounter (Signed)
Please have her stop taking her antihistamine, begin Mucinex 1200 mg twice a day. She will notice more drainage with this routine as we are draining the mucus out of her head. Continue a nasal saline rinse several times a day and use Flonase 2 sprays in each nostril once a day. Please call back if no improvement in a few days. Thank you

## 2021-08-01 LAB — HM PAP SMEAR: HM Pap smear: NEGATIVE

## 2021-08-02 ENCOUNTER — Telehealth (HOSPITAL_BASED_OUTPATIENT_CLINIC_OR_DEPARTMENT_OTHER): Payer: BLUE CROSS/BLUE SHIELD | Admitting: Psychiatry

## 2021-08-02 ENCOUNTER — Other Ambulatory Visit: Payer: Self-pay

## 2021-08-02 DIAGNOSIS — F3130 Bipolar disorder, current episode depressed, mild or moderate severity, unspecified: Secondary | ICD-10-CM | POA: Diagnosis not present

## 2021-08-02 MED ORDER — BUPROPION HCL ER (SR) 200 MG PO TB12: 200.0000 mg | ORAL_TABLET | Freq: Two times a day (BID) | ORAL | 2 refills | Status: DC

## 2021-08-02 MED ORDER — CLONAZEPAM 1 MG PO TABS: ORAL_TABLET | ORAL | 4 refills | Status: DC

## 2021-08-02 NOTE — Progress Notes (Signed)
[name] MD/PA/NP OP Progress Note  08/02/2021 2:01 PM Nicole Rodriguez  MRN:  [id number] Interview was conducted by phone and I verified that I was speaking with the correct person using two identifiers. I discussed the limitations of evaluation and management by telemedicine and  the availability of in person appointments. Patient expressed understanding and agreed to proceed. Participants in the visit: patient (location - home); physician (location - home office).  Chief Complaint: Panic attacks.  Nicole Rodriguez.  Visit Diagnosis:    Today the patient is doing well.  She had missed a session last because of insurance issues.  But she is really been stable.  She followed her plan pretty much.  She discontinued the Latuda as planned.  She also discontinued her Lamictal which was not quite as planned but I do not think it is a problem.  The patient has a new job working at the [employer] and has a good job which she really likes.  She says she has a good schedule.  She is positive and optimistic.  She has no signs of depression.  She has no signs of mania.  She now is more motivated has less lethargy.  She is positive about things in the future.  The patient however has continued her Klonopin which is okay with me.  Financially she is very stable.  She has no signs of psychosis.  She drinks no alcohol and uses no drugs.  Virtual Visit via Telephone Note  I connected with Nicole Rodriguez on 08/02/21 at  1:30 PM EST by telephone and verified that I am speaking with the correct person using two identifiers.  Location: Patient: home Provider: office   I discussed the limitations, risks, security and privacy concerns of performing an evaluation and management service by telephone and the availability of in person appointments. I also discussed with the patient that there may be a patient responsible charge related to this service. The patient expressed understanding and agreed to proceed.      I  discussed the assessment and treatment plan with the patient. The patient was provided an opportunity to ask questions and all were answered. The patient agreed with the plan and demonstrated an understanding of the instructions.   The patient was advised to call back or seek an in-person evaluation if the symptoms worsen or if the condition fails to improve as anticipated.  I provided 30 minutes of non-face-to-face time during this encounter.   Nicole Ralph, MD  No diagnosis found.  Past Psychiatric History: Please see intake H&P.  Past Medical History:  Past Medical History:  Diagnosis Date   Asthma    Bipolar disorder ([facility])    Generalized anxiety disorder    GERD (gastroesophageal reflux disease)    Phreesia 08/17/2020   IBS (irritable bowel syndrome)    Plantar fasciitis 10/15/2018   Rectal bleeding 09/24/2019   Social anxiety disorder     Past Surgical History:  Procedure Laterality Date   APPENDECTOMY     BREAST LUMPECTOMY     COLONOSCOPY WITH PROPOFOL N/A 12/03/2019   Procedure: COLONOSCOPY WITH PROPOFOL;  Surgeon: Virgel Manifold, MD;  Location: [facility] ENDOSCOPY;  Service: Endoscopy;  Laterality: N/A;   ESOPHAGOGASTRODUODENOSCOPY (EGD) WITH PROPOFOL N/A 12/03/2019   Procedure: ESOPHAGOGASTRODUODENOSCOPY (EGD) WITH PROPOFOL;  Surgeon: Virgel Manifold, MD;  Location: [facility] ENDOSCOPY;  Service: Endoscopy;  Laterality: N/A;   FOOT SURGERY      Family Psychiatric History: Reviewed.  Family History:  Family History  Problem Relation Age of Onset   Allergic rhinitis Father    Asthma Father    Crohn's disease Sister        half sister   Anxiety disorder Maternal Grandmother    Depression Maternal Grandmother    Diverticulitis Paternal Grandmother    Diabetes Other    Heart failure Other    Colon cancer Neg Hx    Celiac disease Neg Hx     Social History:  Social History   Socioeconomic History   Marital status: Married    Spouse name: Not on file    Number of children: 1   Years of education: Not on file   Highest education level: Not on file  Occupational History   Not on file  Tobacco Use   Smoking status: Never    Passive exposure: Never   Smokeless tobacco: Never  Vaping Use   Vaping Use: Never used  Substance and Sexual Activity   Alcohol use: Yes    Comment: socially    Drug use: No   Sexual activity: Yes    Birth control/protection: None  Other Topics Concern   Not on file  Social History Narrative   Daughter 65 years old.   Social Determinants of Health   Financial Resource Strain: Not on file  Food Insecurity: Not on file  Transportation Needs: Not on file  Physical Activity: Not on file  Stress: Not on file  Social Connections: Not on file    Allergies:  Allergies  Allergen Reactions   Amoxicillin Hives   Asa [Aspirin] Hives   Cefoxitin Itching and Rash   Sulfa Antibiotics Hives    Metabolic Disorder Labs: Lab Results  Component Value Date   HGBA1C 5.2 02/01/2017   No results found for: PROLACTIN Lab Results  Component Value Date   CHOL 159 02/01/2017   TRIG 85 02/01/2017   HDL 44 02/01/2017   CHOLHDL 3.6 02/01/2017   LDLCALC 98 02/01/2017   LDLCALC 112 (H) 09/14/2016   Lab Results  Component Value Date   TSH 1.500 09/14/2016    Therapeutic Level Labs: No results found for: LITHIUM No results found for: VALPROATE No components found for:  CBMZ  Current Medications: Current Outpatient Medications  Medication Sig Dispense Refill   AIRDUO DIGIHALER 113-14 MCG/ACT AEPB Inhale 1 puff into the lungs 2 (two) times daily. 1 each 1   albuterol (PROVENTIL) (2.5 MG/3ML) 0.083% nebulizer solution Take 3 mLs (2.5 mg total) by nebulization every 6 (six) hours as needed for wheezing or shortness of breath. 75 mL 3   albuterol (VENTOLIN HFA) 108 (90 Base) MCG/ACT inhaler Inhale 2 puffs into the lungs every 6 (six) hours as needed for wheezing or shortness of breath. 8 g 5   azelastine (ASTELIN)  0.1 % nasal spray 2 sprays per nostril 1-2 times daily as needed. 30 mL 1   benzonatate (TESSALON) 100 MG capsule Take 1 capsule (100 mg total) by mouth 2 (two) times daily as needed for cough. 30 capsule 0   buPROPion (WELLBUTRIN SR) 200 MG 12 hr tablet Take 1 tablet (200 mg total) by mouth 2 (two) times daily. Please take the second dose not later than 4 PM. 60 tablet 2   clonazePAM (KLONOPIN) 1 MG tablet 1  qhs 30 tablet 4   fluticasone (FLONASE) 50 MCG/ACT nasal spray Place 1 spray into both nostrils daily. 16 g 5   fluticasone (FLONASE) 50 MCG/ACT nasal spray Place 1 spray into both nostrils daily. [place]  g 1   levocetirizine (XYZAL) 5 MG tablet TAKE 1 TABLET EVERY EVENING 30 tablet 1   montelukast (SINGULAIR) 10 MG tablet TAKE 1 TABLET AT BEDTIME 30 tablet 1   pantoprazole (PROTONIX) 40 MG tablet TAKE 1 TABLET(40 MG) BY MOUTH TWICE DAILY 60 tablet 5   No current facility-administered medications for this visit.      Psychiatric Specialty Exam: Review of Systems  Psychiatric/Behavioral:  The patient is nervous/anxious.   All other systems reviewed and are negative.  There were no vitals taken for this visit.There is no height or weight on file to calculate BMI.  General Appearance: NA  Eye Contact:  NA  Speech:  Clear and Coherent and Normal Rate  Volume:  Normal  Mood:  Anxious  Affect:  NA  Thought Process:  Goal Directed  Orientation:  Full (Time, Place, and Person)  Thought Content: Rumination   Suicidal Thoughts:  No  Homicidal Thoughts:  No  Memory:  Immediate;   Good Recent;   Good Remote;   Good  Judgement:  Good  Insight:  Fair  Psychomotor Activity:  NA  Concentration:  Concentration: Fair  Recall:  Good  Fund of Knowledge: Good  Language: Good  Akathisia:  Negative  Handed:  Right  AIMS (if indicated): not done  Assets:  Communication Skills Desire for Improvement Financial Resources/Insurance Housing Social Support Talents/Skills  ADL's:  Intact   Cognition: WNL  Sleep:  Good   Screenings: GAD-7    Flowsheet Row Office Visit from 06/18/2019 in [facility] Office Visit from 06/05/2019 in [place] Office Visit from 04/01/2019 in [facility]  Total GAD-7 Score 20 19 0      PHQ2-9    [place] Office Visit from 06/30/2021 in [facility] Video Visit from 11/15/2020 in [facility] Office Visit from 08/20/2020 in [facility] Office Visit from 06/18/2019 in [facility] Office Visit from 06/05/2019 in [place]  PHQ-2 Total Score 0 0 0 5 6  PHQ-9 Total Score -- -- 0 20 [facility] ED from 05/03/2021 in [facility] ED from 04/07/2021 in [facility] at [facility] ED from 09/06/2020 in [facility] Error: Question 6 not populated No Risk No Risk        Assessment and Plan:     Bipolar type II is her diagnosis.  At this time she will continue taking Wellbutrin 300 mg and continue Klonopin just 1 mg at night.  Her next visit we will do a more thorough evaluation of her diagnosis.  She will be seen again in 2 months.  The patient also has a problem of insomnia.  She will continue taking Klonopin 1 mg for this condition.   Dx: Bipolar 2 disorder rapid cycling; Mixed anxiety disorder (panic disorder' GAD, social anxiety)   Plan:  Continue Lamictal to 200 mg at HS, Latuda 40 mg at HS, paroxetine 20 mg in PM, trazodone 50 mg prn sleep and bupropion SR 200 mg bid for fatigue. I will stop lorazepam 2 mg prn anxiety (up to tid) and start clonazepam 1 mg bid scheduled initially (then advised to start using it prn after anxiety decreases). We shall write her an excuse from work note for 3/26-4/18/22 period.  Next appointment with a new provider in 1 month. The plan was discussed with patient who had an opportunity to  ask questions and these were all answered. I spend 15  minutes in phone consultation with the patient.    Nicole Ralph, MD 08/02/2021, 2:01 PM

## 2021-08-04 ENCOUNTER — Other Ambulatory Visit: Payer: Self-pay | Admitting: Internal Medicine

## 2021-08-04 ENCOUNTER — Other Ambulatory Visit: Payer: Self-pay | Admitting: Allergy & Immunology

## 2021-08-06 ENCOUNTER — Emergency Department (HOSPITAL_COMMUNITY): Payer: BLUE CROSS/BLUE SHIELD

## 2021-08-06 ENCOUNTER — Encounter (HOSPITAL_COMMUNITY): Payer: Self-pay | Admitting: *Deleted

## 2021-08-06 ENCOUNTER — Emergency Department (HOSPITAL_COMMUNITY)
Admission: EM | Admit: 2021-08-06 | Discharge: 2021-08-06 | Disposition: A | Discharge status: Home / Self Care | Payer: BLUE CROSS/BLUE SHIELD | Attending: Emergency Medicine | Admitting: Emergency Medicine

## 2021-08-06 DIAGNOSIS — R0602 Shortness of breath: Secondary | ICD-10-CM | POA: Diagnosis not present

## 2021-08-06 DIAGNOSIS — J45909 Unspecified asthma, uncomplicated: Secondary | ICD-10-CM | POA: Diagnosis not present

## 2021-08-06 DIAGNOSIS — R000 Tachycardia, unspecified: Secondary | ICD-10-CM | POA: Diagnosis not present

## 2021-08-06 DIAGNOSIS — Z20822 Contact with and (suspected) exposure to covid-19: Secondary | ICD-10-CM | POA: Insufficient documentation

## 2021-08-06 LAB — CBC WITH DIFFERENTIAL/PLATELET
Abs Immature Granulocytes: 0.04 10*3/uL (ref 0.00–0.07)
Basophils Absolute: 0.1 10*3/uL (ref 0.0–0.1)
Basophils Relative: 0 %
Eosinophils Absolute: 0.2 10*3/uL (ref 0.0–0.5)
Eosinophils Relative: 2 %
HCT: 42.8 % (ref 36.0–46.0)
Hemoglobin: 14.5 g/dL (ref 12.0–15.0)
Immature Granulocytes: 0 %
Lymphocytes Relative: 25 %
Lymphs Abs: 3.5 10*3/uL (ref 0.7–4.0)
MCH: 29.7 pg (ref 26.0–34.0)
MCHC: 33.9 g/dL (ref 30.0–36.0)
MCV: 87.5 fL (ref 80.0–100.0)
Monocytes Absolute: 0.9 10*3/uL (ref 0.1–1.0)
Monocytes Relative: 6 %
Neutro Abs: 9.3 10*3/uL — ABNORMAL HIGH (ref 1.7–7.7)
Neutrophils Relative %: 67 %
Platelets: 357 10*3/uL (ref 150–400)
RBC: 4.89 MIL/uL (ref 3.87–5.11)
RDW: 13.5 % (ref 11.5–15.5)
WBC: 14 10*3/uL — ABNORMAL HIGH (ref 4.0–10.5)
nRBC: 0 % (ref 0.0–0.2)

## 2021-08-06 LAB — BASIC METABOLIC PANEL
Anion gap: 12 (ref 5–15)
BUN: 14 mg/dL (ref 6–20)
CO2: 21 mmol/L — ABNORMAL LOW (ref 22–32)
Calcium: 9.3 mg/dL (ref 8.9–10.3)
Chloride: 103 mmol/L (ref 98–111)
Creatinine, Ser: 0.78 mg/dL (ref 0.44–1.00)
GFR, Estimated: >60 mL/min (ref >60)
Glucose, Bld: 70 mg/dL (ref 70–99)
Potassium: 4 mmol/L (ref 3.5–5.1)
Sodium: 136 mmol/L (ref 135–145)

## 2021-08-06 LAB — RESP PANEL BY RT-PCR (FLU A&B, COVID) ARPGX2
Influenza A by PCR: NEGATIVE
Influenza B by PCR: NEGATIVE
SARS Coronavirus 2 by RT PCR: NEGATIVE

## 2021-08-06 LAB — BRAIN NATRIURETIC PEPTIDE: B Natriuretic Peptide: 5 pg/mL (ref 0.0–100.0)

## 2021-08-06 LAB — HCG, SERUM, QUALITATIVE: Preg, Serum: NEGATIVE

## 2021-08-06 LAB — TROPONIN I (HIGH SENSITIVITY): Troponin I (High Sensitivity): <2 ng/L (ref <18)

## 2021-08-06 MED ORDER — SODIUM CHLORIDE 0.9 % IV BOLUS
1000.0000 mL | Freq: Once | INTRAVENOUS | Status: AC
  Administered 2021-08-06: 1000 mL via INTRAVENOUS

## 2021-08-06 MED ORDER — IPRATROPIUM-ALBUTEROL 0.5-2.5 (3) MG/3ML IN SOLN
3.0000 mL | Freq: Once | RESPIRATORY_TRACT | Status: AC
  Administered 2021-08-06: 3 mL via RESPIRATORY_TRACT
  Filled 2021-08-06: qty 3

## 2021-08-06 MED ORDER — IPRATROPIUM-ALBUTEROL 0.5-2.5 (3) MG/3ML IN SOLN
3.0000 mL | Freq: Once | RESPIRATORY_TRACT | Status: AC
Start: 2021-08-06 — End: 2021-08-06
  Administered 2021-08-06: 3 mL via RESPIRATORY_TRACT
  Filled 2021-08-06: qty 3

## 2021-08-06 MED ORDER — PREDNISONE 10 MG PO TABS: 20.0000 mg | ORAL_TABLET | Freq: Every day | ORAL | 0 refills | Status: DC

## 2021-08-06 MED ORDER — SODIUM CHLORIDE 0.9 % IV BOLUS: 500.0000 mL | Freq: Once | INTRAVENOUS | Status: DC

## 2021-08-06 MED ORDER — IOHEXOL 350 MG/ML SOLN
75.0000 mL | Freq: Once | INTRAVENOUS | Status: AC | PRN
  Administered 2021-08-06: 75 mL via INTRAVENOUS

## 2021-08-06 MED ORDER — METHYLPREDNISOLONE SODIUM SUCC 125 MG IJ SOLR
125.0000 mg | Freq: Once | INTRAMUSCULAR | Status: AC
  Administered 2021-08-06: 125 mg via INTRAVENOUS
  Filled 2021-08-06: qty 2

## 2021-08-06 MED ORDER — MAGNESIUM SULFATE 2 GM/50ML IV SOLN
2.0000 g | Freq: Once | INTRAVENOUS | Status: AC
  Administered 2021-08-06: 2 g via INTRAVENOUS
  Filled 2021-08-06: qty 50

## 2021-08-06 NOTE — ED Provider Notes (Signed)
[facility] EMERGENCY DEPARTMENT Provider Note   CSN: [id number] Arrival date & time: 08/06/21  1208     History  Chief Complaint  Patient presents with   Shortness of [place] is a 31 y.o. female.   Shortness of Breath  Patient with history of asthma, bipolar, GERD, anxiety presenting due to shortness of breath and chest tightness.  Reports she has been feeling short of breath since November, she has been on 2 steroid tapers which have somewhat alleviated the symptoms.  Her primary recently changed her daily medication for her asthma.  She is to her albuterol inhaler 4 times earlier today without any relief of symptoms.  States this morning she woke up with yellow productive cough, has not had a productive cough previously.  Also having chest tightness that is pleuritic, denies having pain like this previously.  No history of PE or MI.  Does not smoke cigarettes, not on oral birth control, no recent travel or surgeries.  Home Medications Prior to Admission medications   Medication Sig Start Date End Date Taking? Authorizing Provider  predniSONE (DELTASONE) 10 MG tablet Take 2 tablets (20 mg total) by mouth daily. 08/06/21  Yes Sherrill Raring, PA-C  AIRDUO DIGIHALER 846-65 MCG/ACT AEPB Inhale 1 puff into the lungs 2 (two) times daily. 07/13/21   Valentina Shaggy, MD  albuterol (PROVENTIL) (2.5 MG/3ML) 0.083% nebulizer solution Take 3 mLs (2.5 mg total) by nebulization every 6 (six) hours as needed for wheezing or shortness of breath. 02/11/20   Malachy Mood, MD  albuterol (VENTOLIN HFA) 108 (90 Base) MCG/ACT inhaler Inhale 2 puffs into the lungs every 6 (six) hours as needed for wheezing or shortness of breath. 06/30/21   Lindell Spar, MD  azelastine (ASTELIN) 0.1 % nasal spray 2 sprays per nostril 1-2 times daily as needed. 07/13/21   Valentina Shaggy, MD  benzonatate (TESSALON) 100 MG capsule Take 1 capsule (100 mg total) by mouth 2 (two) times daily as  needed for cough. 06/30/21   Lindell Spar, MD  buPROPion (WELLBUTRIN SR) 200 MG 12 hr tablet Take 1 tablet (200 mg total) by mouth 2 (two) times daily. Please take the second dose not later than 4 PM. 08/02/21 10/31/21  Plovsky, Berneta Sages, MD  clonazePAM Bobbye [place]) 1 MG tablet 1  qhs 08/02/21   Plovsky, Berneta Sages, MD  fluticasone (FLONASE) 50 MCG/ACT nasal spray Place 1 spray into both nostrils daily. 10/09/19   Parrett, Fonnie Mu, NP  fluticasone (FLONASE) 50 MCG/ACT nasal spray Place 1 spray into both nostrils daily. 07/13/21   Valentina Shaggy, MD  levocetirizine (XYZAL) 5 MG tablet TAKE 1 TABLET BY MOUTH EVERY DAY IN THE EVENING 08/04/21   Lindell Spar, MD  montelukast (SINGULAIR) 10 MG tablet TAKE 1 TABLET BY MOUTH EVERYDAY AT BEDTIME 08/04/21   Lindell Spar, MD  pantoprazole (PROTONIX) 40 MG tablet TAKE 1 TABLET(40 MG) BY MOUTH TWICE DAILY 05/15/20   Collene Gobble, MD      Allergies    Amoxicillin, Asa [aspirin], Cefoxitin, and Sulfa antibiotics    Review of Systems   Review of Systems  Respiratory:  Positive for shortness of breath.    Physical Exam Updated Vital Signs BP 122/71    Pulse (!) 117    Temp 97.9 F (36.6 C) (Oral)    Resp (!) 26    Ht 5\' 2"  (1.575 m)    Wt 96.4 kg    SpO2 96%  BMI 38.87 kg/m  Physical Exam Vitals and nursing note reviewed. Exam conducted with a chaperone present.  Constitutional:      Appearance: Normal appearance. She is ill-appearing.  HENT:     Head: Normocephalic and atraumatic.  Eyes:     General: No scleral icterus.       Right eye: No discharge.        Left eye: No discharge.     Extraocular Movements: Extraocular movements intact.     Pupils: Pupils are equal, round, and reactive to light.  Cardiovascular:     Rate and Rhythm: Regular rhythm. Tachycardia present.     Pulses: Normal pulses.     Heart sounds: Normal heart sounds. No murmur heard.   No friction rub. No gallop.  Pulmonary:     Effort: Pulmonary effort is normal.  Tachypnea present. No respiratory distress.     Breath sounds: Normal breath sounds. No wheezing.  Chest:     Chest wall: No tenderness.  Abdominal:     General: Abdomen is flat. Bowel sounds are normal. There is no distension.     Palpations: Abdomen is soft.     Tenderness: There is no abdominal tenderness.  Musculoskeletal:     Right lower leg: No edema.     Left lower leg: No edema.  Skin:    General: Skin is warm and dry.     Coloration: Skin is not jaundiced.  Neurological:     Mental Status: She is alert. Mental status is at baseline.     Coordination: Coordination normal.    ED Results / Procedures / Treatments   Labs (all labs ordered are listed, but only abnormal results are displayed) Labs Reviewed  CBC WITH DIFFERENTIAL/PLATELET - Abnormal; Notable for the following components:      Result Value   WBC 14.0 (*)    Neutro Abs 9.3 (*)    All other components within normal limits  BASIC METABOLIC PANEL - Abnormal; Notable for the following components:   CO2 21 (*)    All other components within normal limits  RESP PANEL BY RT-PCR (FLU A&B, COVID) ARPGX2  BRAIN NATRIURETIC PEPTIDE  HCG, SERUM, QUALITATIVE  TROPONIN I (HIGH SENSITIVITY)    EKG EKG Interpretation  Date/Time:  Saturday August 06 2021 12:38:10 EST Ventricular Rate:  120 PR Interval:  127 QRS Duration: 119 QT Interval:  359 QTC Calculation: 508 R Axis:   56 Text Interpretation: Sinus tachycardia Nonspecific intraventricular conduction delay Low voltage, extremity and precordial leads Baseline wander in lead(s) II III aVF V4 Confirmed by Godfrey Pick ([id number]) on 08/06/2021 12:45:08 PM  Radiology CT Angio Chest PE W and/or Wo Contrast  Result Date: 08/06/2021 CLINICAL DATA:  Shortness of breath with congestion, pulmonary embolism suspected, high probability. EXAM: CT ANGIOGRAPHY CHEST WITH CONTRAST TECHNIQUE: Multidetector CT imaging of the chest was performed using the standard protocol during bolus  administration of intravenous contrast. Multiplanar CT image reconstructions and MIPs were obtained to evaluate the vascular anatomy. RADIATION DOSE REDUCTION: This exam was performed according to the departmental dose-optimization program which includes automated exposure control, adjustment of the mA and/or kV according to patient size and/or use of iterative reconstruction technique. CONTRAST:  33mL OMNIPAQUE IOHEXOL 350 MG/ML SOLN COMPARISON:  None. FINDINGS: Cardiovascular: No pulmonary embolism is seen within the main, lobar or central segmental pulmonary arteries bilaterally. No thoracic aortic aneurysm or evidence of aortic dissection. No pericardial effusion. Mediastinum/Nodes: No mass or enlarged lymph nodes are seen within the  mediastinum. Esophagus appears normal. Trachea and central bronchi are unremarkable. Lungs/Pleura: Lungs are clear.  No pleural effusion or pneumothorax. Upper Abdomen: Limited images of the upper abdomen are unremarkable. Musculoskeletal: Osseous structures about the chest are unremarkable. Review of the MIP images confirms the above findings. IMPRESSION: Normal exam. No pulmonary embolism seen. Lungs are clear. Electronically Signed   By: Franki Cabot M.D.   On: 08/06/2021 14:55   DG Chest Portable 1 View  Result Date: 08/06/2021 CLINICAL DATA:  Shortness of breath EXAM: PORTABLE CHEST 1 VIEW COMPARISON:  Chest radiograph dated September 06, 2020 FINDINGS: The heart size and mediastinal contours are within normal limits. Both lungs are clear. The visualized skeletal structures are unremarkable. IMPRESSION: No active disease. Electronically Signed   By: Keane Police D.O.   On: 08/06/2021 13:05    Procedures Procedures    Medications Ordered in ED Medications  ipratropium-albuterol (DUONEB) 0.5-2.5 (3) MG/3ML nebulizer solution 3 mL (3 mLs Nebulization Given 08/06/21 1258)  methylPREDNISolone sodium succinate (SOLU-MEDROL) 125 mg/2 mL injection 125 mg (125 mg Intravenous Given  08/06/21 1328)  iohexol (OMNIPAQUE) 350 MG/ML injection 75 mL (75 mLs Intravenous Contrast Given 08/06/21 1434)  ipratropium-albuterol (DUONEB) 0.5-2.5 (3) MG/3ML nebulizer solution 3 mL (3 mLs Nebulization Given 08/06/21 1513)  magnesium sulfate IVPB 2 g 50 mL (0 g Intravenous Stopped 08/06/21 1637)  sodium chloride 0.9 % bolus 1,000 mL (0 mLs Intravenous Stopped 08/06/21 1657)    ED Course/ Medical Decision Making/ A&P                           Medical Decision Making Amount and/or Complexity of Data Reviewed Labs: ordered. Radiology: ordered.  Risk Prescription drug management.   This patient presents to the ED for concern of chest pain shortness of breath, this involves an extensive number of treatment options, and is a complaint that carries with it a high risk of complications and morbidity.  The differential diagnosis includes asthma exacerbation, PE, ACS, pneumonia, other   Co morbidities that complicate the patient evaluation: asthma    Additional history obtained: -External records from outside source obtained and reviewed including: Chart review including previous notes, labs, imaging, consultation notes   Lab Tests: -I ordered, reviewed, and interpreted labs.  The pertinent results include: Patient has a leukocytosis of 14.  No anemia, no gross electrolyte derangement.  No AKI.  BNP and troponin both low.  Patient is not pregnant.   EKG -Sinus tachycardia, no ischemic ST segment changes or T wave inversions   Imaging Studies ordered: -I ordered imaging studies including chest x-ray and CTA chest PE -I independently visualized and interpreted imaging which showed chest x-ray did not show any acute process.  CTA does not show any evidence of PE or pneumonia. -I agree with the radiologist interpretation   Medicines ordered and prescription drug management: -I ordered medication including DuoNeb and Solu-Medrol for shortness of breath -Reevaluation of the patient after  these medicines showed that the patient improved -I have reviewed the patients home medicines and have made adjustments as needed    ED Course: Patient with medical history of asthma presents due to shortness of breath and chest tightness.  She is tachycardic and tachypneic on exam, she is moving air throughout her lungs without any auscultatory wheezes. Patient has remained tachycardic throughout her ED stay.  There is no evidence of pneumonia, no PE noted on the CTA.  Troponin and BNP within normal limits,  mild leukocytosis of 14.  No gross electrolyte derangement, she is not hypoxic.  EKG without any ischemic findings. On reevaluation patient does not report improvement after the treatments.  However, heart rate is still elevated in the 120s.  I did personally review her previous medical records, her heart rate has been in the 120s 130s in the month of December at her office visits.  Patient attributes that to having just ambulated at the time of intake vitals.  Patient's pulse rate is typically in the 90s per the patient.  It is possible that the albuterol she is contributing to her tachycardia.  Anxiety may also be playing a role.   After fluids patient's heart rate dropped to 115.  Patient denies any recurrent chest pain.  She reports feeling significantly improved and no longer feeling short of breath.  She is ambulatory to the bathroom without becoming hypoxic.  No persistent tachypnea, we engaged in shared decision-making and patient would prefer to go home with steroid taper and return precautions.  I think that is reasonable.  Discharged in stable condition.   Cardiac Monitoring: The patient was maintained on a cardiac monitor.  I personally viewed and interpreted the cardiac monitored which showed an underlying rhythm of: Sinus tachycardia    Reevaluation: After the interventions noted above, I reevaluated the patient and found that they have  :resolved   Dispostion: D/C         Final Clinical Impression(s) / ED Diagnoses Final diagnoses:  Shortness of breath    Rx / DC Orders ED Discharge Orders          Ordered    predniSONE (DELTASONE) 10 MG tablet  Daily        08/06/21 1656              Sherrill Raring, PA-C 08/06/21 1915    Godfrey Pick, MD 08/09/21 1406

## 2021-08-06 NOTE — ED Triage Notes (Signed)
Shortness of breath with congestion

## 2021-08-06 NOTE — Discharge Instructions (Addendum)
Take 40 mg of prednisone for the next 5 days.  Follow-up with your PCP next week if symptoms persist.  Continue using your albuterol as prescribed as well as your daily inhaler.  Return if you start having worsening chest pain or short of breath.

## 2021-08-07 ENCOUNTER — Encounter: Payer: Self-pay | Admitting: Allergy & Immunology

## 2021-08-10 ENCOUNTER — Other Ambulatory Visit: Payer: Self-pay

## 2021-08-10 ENCOUNTER — Ambulatory Visit: Payer: BLUE CROSS/BLUE SHIELD | Admitting: Allergy & Immunology

## 2021-08-10 ENCOUNTER — Encounter: Payer: Self-pay | Admitting: Allergy & Immunology

## 2021-08-10 VITALS — BP 130/88 | HR 105 | Temp 98.0°F | Resp 20 | Ht 62.0 in | Wt 214.0 lb

## 2021-08-10 DIAGNOSIS — J4550 Severe persistent asthma, uncomplicated: Secondary | ICD-10-CM | POA: Diagnosis not present

## 2021-08-10 DIAGNOSIS — J4540 Moderate persistent asthma, uncomplicated: Secondary | ICD-10-CM

## 2021-08-10 DIAGNOSIS — J310 Chronic rhinitis: Secondary | ICD-10-CM | POA: Diagnosis not present

## 2021-08-10 MED ORDER — FLUTICASONE PROPIONATE 50 MCG/ACT NA SUSP: 1.0000 | Freq: Every day | NASAL | 5 refills | Status: DC

## 2021-08-10 MED ORDER — LEVOCETIRIZINE DIHYDROCHLORIDE 5 MG PO TABS: 5.0000 mg | ORAL_TABLET | Freq: Two times a day (BID) | ORAL | 5 refills | Status: DC | PRN

## 2021-08-10 MED ORDER — AZELASTINE HCL 0.1 % NA SOLN: NASAL | 1 refills | Status: DC

## 2021-08-10 MED ORDER — AIRDUO DIGIHALER 113-14 MCG/ACT IN AEPB: 1.0000 | INHALATION_SPRAY | Freq: Two times a day (BID) | RESPIRATORY_TRACT | 5 refills | Status: DC

## 2021-08-10 MED ORDER — BENRALIZUMAB 30 MG/ML ~~LOC~~ SOSY
30.0000 mg | PREFILLED_SYRINGE | SUBCUTANEOUS | Status: DC
  Administered 2021-08-10: 30 mg via SUBCUTANEOUS

## 2021-08-10 NOTE — Patient Instructions (Addendum)
1. Moderate persistent asthma, uncomplicated - Lung testing looked a little lower today, but this was likely related to your recent asthma exacerbation.  - I think we are on the right track, but we are going to add on Fasenra to help with decreasing your attacks and help your breathing. - Consent signed. - Tammy will reach out to discuss the approval process. - Sample of [place] given today.  - Complete the prednisone course that the ED prescribed for you. - Daily controller medication(s):  AirDuo 113/40mcg one puff twice daily  + Fasenra every month x 3 doses (then every 8 weeks thereafter) - Prior to physical activity: albuterol 2 puffs 10-15 minutes before physical activity. - Rescue medications: albuterol 4 puffs every 4-6 hours as needed - Asthma control goals:  * Full participation in all desired activities (may need albuterol before activity) * Albuterol use two time or less a week on average (not counting use with activity) * Cough interfering with sleep two time or less a month * Oral steroids no more than once a year * No hospitalizations  2. Chronic rhinitis (grasses only) - Continue with: Xyzal (levocetirizine) 5mg  tablet once daily, Singulair (montelukast) 10mg  daily, and Flonase (fluticasone) one spray per nostril daily - Continue taking: Astelin (azelastine) 2 sprays per nostril 1-2 times daily as needed - You can use an extra dose of the antihistamine, if needed, for breakthrough symptoms.  - Consider nasal saline rinses 1-2 times daily to remove allergens from the nasal cavities as well as help with mucous clearance (this is especially helpful to do before the nasal sprays are given)  3. Return in about 4 weeks (around 09/07/2021). This can take place on the same day as your next Fasenra injection.   Please inform us of any Emergency Department visits, hospitalizations, or changes in symptoms. Call us before going to the ED for breathing or allergy symptoms since we might be  able to fit you in for a sick visit. Feel free to contact us anytime with any questions, problems, or concerns.  It was a pleasure to see you again today!  Websites that have reliable patient information: 1. American Academy of Asthma, Allergy, and Immunology: www.aaaai.org 2. Food Allergy Research and Education (FARE): foodallergy.org 3. Mothers of Asthmatics: http://www.asthmacommunitynetwork.org 4. American College of Allergy, Asthma, and Immunology: www.acaai.org   COVID-19 Vaccine Information can be found at: ShippingScam.co.uk For questions related to vaccine distribution or appointments, please email [email or web address] or call [phone].   We realize that you might be concerned about having an allergic reaction to the COVID19 vaccines. To help with that concern, WE ARE OFFERING THE COVID19 VACCINES IN OUR OFFICE! Ask the front desk for dates!     Like [place] on [employer] [place] and Instagram for our latest updates!      A healthy democracy works best when [employer] participate! Make sure you are registered to vote! If you have moved or changed any of your contact information, you will need to get this updated before voting!  In some cases, you MAY be able to register to vote online: [email or web address]

## 2021-08-10 NOTE — Progress Notes (Signed)
FOLLOW UP  Date of Service/Encounter:  08/10/21   Assessment:   Moderate persistent asthma, uncomplicated - with AEC 295 March 2022 (initiated [place] today)   Chronic rhinitis - with minimally reactive testing to indoor and outdoor molds  Recurrent prednisone courses in the last 6 months   Plan/Recommendations:   1. Moderate persistent asthma, uncomplicated - Lung testing looked a little lower today, but this was likely related to your recent asthma exacerbation.  - I think we are on the right track, but we are going to add on Fasenra to help with decreasing your attacks and help your breathing. - Consent signed. - Nicole Rodriguez will reach out to discuss the approval process. - Sample of [place] given today.  - Complete the prednisone course that the ED prescribed for you. - Daily controller medication(s):  AirDuo 113/35mcg one puff twice daily  + Fasenra every month x 3 doses (then every 8 weeks thereafter) - Prior to physical activity: albuterol 2 puffs 10-15 minutes before physical activity. - Rescue medications: albuterol 4 puffs every 4-6 hours as needed - Asthma control goals:  * Full participation in all desired activities (may need albuterol before activity) * Albuterol use two time or less a week on average (not counting use with activity) * Cough interfering with sleep two time or less a month * Oral steroids no more than once a year * No hospitalizations  2. Chronic rhinitis (grasses only) - Continue with: Xyzal (levocetirizine) 5mg  tablet once daily, Singulair (montelukast) 10mg  daily, and Flonase (fluticasone) one spray per nostril daily - Continue taking: Astelin (azelastine) 2 sprays per nostril 1-2 times daily as needed - You can use an extra dose of the antihistamine, if needed, for breakthrough symptoms.  - Consider nasal saline rinses 1-2 times daily to remove allergens from the nasal cavities as well as help with mucous clearance (this is especially helpful to do  before the nasal sprays are given)  3. Return in about 4 weeks (around 09/07/2021). This can take place on the same day as your next Fasenra injection.    Subjective:   Nicole Rodriguez is a 31 y.o. female presenting today for follow up of  Chief Complaint  Patient presents with   Asthma    4 wk f/u - Not any better. Patient states she was in the ER on Saturday 08/06/21 due to shortness of breath, coughing    Nicole Rodriguez has a history of the following: Patient Active Problem List   Diagnosis Date Noted   Intermittent palpitations 08/20/2020   Gastric polyp    GAD (generalized anxiety disorder) 07/19/2019   Social anxiety disorder 07/19/2019   Panic disorder 07/19/2019   Bipolar 2 disorder ([place]) 07/19/2019   Allergic rhinitis 08/19/2018   GERD (gastroesophageal reflux disease) 08/19/2018   Asthma 11/29/2017   Breast mass, right 03/18/2014   Numbness and tingling in both hands 01/26/2014    History obtained from: chart review and patient.  Nicole Rodriguez is a 31 y.o. female presenting for a follow up visit.  She was last seen as a new patient and January 2023.  At that time, lung testing looked normal but improved with the albuterol treatment.  We started her on AirDuo 113/14 mcg 1 puff twice daily as well as albuterol as needed.  She underwent prick testing and intradermal testing but was only positive to grasses.  We continued the Xyzal and the Singulair as well as the Flonase.  We added Astelin 2 sprays per nostril up to  twice daily.  Since last visit, she has not done too well. She was coughing all day last Friday. Saturday she was coughing up yellow mucous and she was coughing incessantly. Albuterol did not help. She got nebs and IV magnesium. CT was totally clear. She feels that she is not feeling better now. She felt better that day but her cough is still linger and producing. She was feeling better prior to Friday and she felt that she was headed in the right direction.  No one  else is sick at home, including her wife and her daughter.  However, before going to the ER over the weekend, she thinks she was headed on the right course with improved ability to pursue activities of daily living.  She was using her albuterol less.  She does remain interested in additional modalities to prevent her from needing prednisone and improving her quality of life.  She has had prednisone three times in the last two months and two rounds of antibiotics. She is coughing up less yellowish stuff now.  The prednisone seems to have improved the yellow mucus.  All of the symptoms started in October or November 2022.  She had asthma before this, but her symptoms certainly worsened at that time.  She did start her new job in October 2022 with [employer], but she does not even work in the [employer].  There is no one else at work who is experiencing similar symptoms.  Looking back, she does note that she tended to get sick in November of every year for the past 5 to 10 years.  This would require prednisone and antibiotics.  GERD Symptom History: She remains on the Protonix once daily for two years. She feels that her reflux is under good control.   Otherwise, there have been no changes to her past medical history, surgical history, family history, or social history.    Review of Systems  Constitutional: Negative.  Negative for chills, fever, malaise/fatigue and weight loss.  HENT: Negative.  Negative for congestion, ear discharge and ear pain.   Eyes:  Negative for pain, discharge and redness.  Respiratory:  Positive for cough and sputum production. Negative for shortness of breath and wheezing.   Cardiovascular: Negative.  Negative for chest pain and palpitations.  Gastrointestinal:  Negative for abdominal pain, constipation, diarrhea, heartburn, nausea and vomiting.  Skin: Negative.  Negative for itching and rash.  Neurological:  Negative for dizziness and headaches.   Endo/Heme/Allergies:  Negative for environmental allergies. Does not bruise/bleed easily.      Objective:   Blood pressure 130/88, pulse (!) 105, temperature 98 F (36.7 C), resp. rate 20, height 5\' 2"  (1.575 m), weight 214 lb (97.1 kg). Body mass index is 39.14 kg/m.   Physical Exam:  Physical Exam Vitals reviewed.  Constitutional:      Appearance: She is well-developed.     Comments: Very pleasant and talkative.  HENT:     Head: Normocephalic and atraumatic.     Right Ear: Tympanic membrane, ear canal and external ear normal. No drainage, swelling or tenderness. Tympanic membrane is not injected, scarred, erythematous, retracted or bulging.     Left Ear: Tympanic membrane, ear canal and external ear normal. No drainage, swelling or tenderness. Tympanic membrane is not injected, scarred, erythematous, retracted or bulging.     Nose: No nasal deformity, septal deviation, mucosal edema or rhinorrhea.     Right Turbinates: Enlarged, swollen and pale.     Left Turbinates: Enlarged,  swollen and pale.     Right Sinus: No maxillary sinus tenderness or frontal sinus tenderness.     Left Sinus: No maxillary sinus tenderness or frontal sinus tenderness.     Comments: No nasal polyps.    Mouth/Throat:     Lips: Pink.     Mouth: Mucous membranes are not pale and not dry.     Pharynx: Uvula midline.     Comments: Cobblestoning present.  Eyes:     General:        Right eye: No discharge.        Left eye: No discharge.     Conjunctiva/sclera: Conjunctivae normal.     Right eye: Right conjunctiva is not injected. No chemosis.    Left eye: Left conjunctiva is not injected. No chemosis.    Pupils: Pupils are equal, round, and reactive to light.  Cardiovascular:     Rate and Rhythm: Normal rate and regular rhythm.     Heart sounds: Normal heart sounds.  Pulmonary:     Effort: Pulmonary effort is normal. No tachypnea, accessory muscle usage or respiratory distress.     Breath sounds:  Normal breath sounds. No wheezing, rhonchi or rales.     Comments: Coarse rhonchi throughout.  No wheezing. Chest:     Chest wall: No tenderness.  Abdominal:     Tenderness: There is no abdominal tenderness. There is no guarding or rebound.  Lymphadenopathy:     Head:     Right side of head: No submandibular, tonsillar or occipital adenopathy.     Left side of head: No submandibular, tonsillar or occipital adenopathy.     Cervical: No cervical adenopathy.  Skin:    General: Skin is warm.     Capillary Refill: Capillary refill takes less than 2 seconds.     Coloration: Skin is not pale.     Findings: No abrasion, erythema, petechiae or rash. Rash is not papular, urticarial or vesicular.     Comments: No eczematous or urticarial lesions noted.  Neurological:     Mental Status: She is alert.  Psychiatric:        Behavior: Behavior is cooperative.     Diagnostic studies:   Spirometry: results normal (FEV1: 2.12/71%, FVC: 2.53/72%, FEV1/FVC: 84%).    Spirometry consistent with possible restrictive disease. Overall values are slightly less compared to last time I saw her.  Allergy Studies: none    Patient was given a sample of Fasenra in the office.  She was monitored for 30 minutes prior to discharge.    Salvatore Marvel, MD  [facility]

## 2021-08-10 NOTE — Progress Notes (Signed)
Patient was seen today in the office by Dr. Ernst Bowler and was informed to start [facility] every four weeks. Patient signed consent and scheduled her four week appointment for her next injection. Patient waited in the lobby for thirty minutes without issue after receiving her injection her left upper arm. Patient was given shot room information. Patient stated that she would like to do self administration at home. Patient was advised that Tammy would reach out her and set up delivery.

## 2021-08-12 ENCOUNTER — Ambulatory Visit: Payer: Self-pay | Admitting: Allergy & Immunology

## 2021-08-16 ENCOUNTER — Telehealth: Payer: Self-pay | Admitting: *Deleted

## 2021-08-16 NOTE — Telephone Encounter (Signed)
L/m for patient to advise approval, copay card and submit for Berna Bue to [facility]

## 2021-08-16 NOTE — Telephone Encounter (Signed)
-----   Message from Valentina Shaggy, MD sent at 08/10/2021 11:22 AM EST ----- Sample of Fasenra given.

## 2021-08-22 ENCOUNTER — Encounter (HOSPITAL_COMMUNITY): Payer: Self-pay | Admitting: Emergency Medicine

## 2021-08-22 ENCOUNTER — Emergency Department (HOSPITAL_COMMUNITY): Payer: BLUE CROSS/BLUE SHIELD

## 2021-08-22 ENCOUNTER — Other Ambulatory Visit: Payer: Self-pay | Admitting: Internal Medicine

## 2021-08-22 ENCOUNTER — Emergency Department (HOSPITAL_COMMUNITY)
Admission: EM | Admit: 2021-08-22 | Discharge: 2021-08-23 | Disposition: A | Discharge status: Home / Self Care | Payer: BLUE CROSS/BLUE SHIELD | Attending: Emergency Medicine | Admitting: Emergency Medicine

## 2021-08-22 ENCOUNTER — Encounter: Payer: Self-pay | Admitting: Internal Medicine

## 2021-08-22 DIAGNOSIS — J45909 Unspecified asthma, uncomplicated: Secondary | ICD-10-CM | POA: Diagnosis not present

## 2021-08-22 DIAGNOSIS — Z7951 Long term (current) use of inhaled steroids: Secondary | ICD-10-CM | POA: Diagnosis not present

## 2021-08-22 DIAGNOSIS — N200 Calculus of kidney: Secondary | ICD-10-CM

## 2021-08-22 DIAGNOSIS — N132 Hydronephrosis with renal and ureteral calculous obstruction: Secondary | ICD-10-CM | POA: Insufficient documentation

## 2021-08-22 DIAGNOSIS — R109 Unspecified abdominal pain: Secondary | ICD-10-CM | POA: Diagnosis present

## 2021-08-22 DIAGNOSIS — K219 Gastro-esophageal reflux disease without esophagitis: Secondary | ICD-10-CM

## 2021-08-22 LAB — URINALYSIS, ROUTINE W REFLEX MICROSCOPIC
Bacteria, UA: NONE SEEN
Bilirubin Urine: NEGATIVE
Glucose, UA: NEGATIVE mg/dL
Ketones, ur: NEGATIVE mg/dL
Leukocytes,Ua: NEGATIVE
Nitrite: NEGATIVE
Protein, ur: NEGATIVE mg/dL
Specific Gravity, Urine: 1.003 — ABNORMAL LOW (ref 1.005–1.030)
pH: 6 (ref 5.0–8.0)

## 2021-08-22 LAB — PREGNANCY, URINE: Preg Test, Ur: NEGATIVE

## 2021-08-22 MED ORDER — ONDANSETRON HCL 4 MG/2ML IJ SOLN
4.0000 mg | Freq: Once | INTRAMUSCULAR | Status: AC
Start: 2021-08-22 — End: 2021-08-22
  Administered 2021-08-22: 4 mg via INTRAVENOUS
  Filled 2021-08-22: qty 2

## 2021-08-22 MED ORDER — SODIUM CHLORIDE 0.9 % IV BOLUS
1000.0000 mL | Freq: Once | INTRAVENOUS | Status: AC
  Administered 2021-08-22: 1000 mL via INTRAVENOUS

## 2021-08-22 MED ORDER — MORPHINE SULFATE (PF) 4 MG/ML IV SOLN
4.0000 mg | Freq: Once | INTRAVENOUS | Status: AC
Start: 2021-08-22 — End: 2021-08-22
  Administered 2021-08-22: 4 mg via INTRAVENOUS
  Filled 2021-08-22: qty 1

## 2021-08-22 MED ORDER — PANTOPRAZOLE SODIUM 40 MG PO TBEC: 40.0000 mg | DELAYED_RELEASE_TABLET | Freq: Two times a day (BID) | ORAL | 5 refills | Status: DC

## 2021-08-22 MED ORDER — KETOROLAC TROMETHAMINE 30 MG/ML IJ SOLN
30.0000 mg | Freq: Once | INTRAMUSCULAR | Status: AC
Start: 2021-08-22 — End: 2021-08-22
  Administered 2021-08-22: 30 mg via INTRAVENOUS
  Filled 2021-08-22: qty 1

## 2021-08-22 NOTE — ED Provider Notes (Signed)
[facility] EMERGENCY DEPARTMENT Provider Note   CSN: [id number] Arrival date & time: 08/22/21  2044     History  Chief Complaint  Patient presents with   Flank Pain    Nicole Rodriguez is a 31 y.o. female.  HPI     This is a 31 year old female who presents with right flank and abdominal pain.  Onset of symptoms started last 1 to 2 days.  Pain is constant but waxes and wanes in intensity.  She reports urinary hesitancy and pressure.  Pain is consistent with prior kidney stones.  She took Tylenol at home with minimal relief.  She has had some nausea without vomiting.  Prior kidney stone diagnosed in 2018.  Denies fevers or dysuria.   Home Medications Prior to Admission medications   Medication Sig Start Date End Date Taking? Authorizing Provider  ondansetron (ZOFRAN-ODT) 4 MG disintegrating tablet Take 1 tablet (4 mg total) by mouth every 8 (eight) hours as needed for nausea or vomiting. 08/23/21  Yes Lomax Poehler, Barbette Hair, MD  oxyCODONE-acetaminophen (PERCOCET/ROXICET) 5-325 MG tablet Take 1 tablet by mouth every 6 (six) hours as needed for severe pain. 08/23/21  Yes Kilea Mccarey, Barbette Hair, MD  AIRDUO [facility] 113-14 MCG/ACT AEPB Inhale 1 puff into the lungs 2 (two) times daily. 08/10/21   Valentina Shaggy, MD  albuterol (PROVENTIL) (2.5 MG/3ML) 0.083% nebulizer solution Take 3 mLs (2.5 mg total) by nebulization every 6 (six) hours as needed for wheezing or shortness of breath. 02/11/20   Malachy Mood, MD  albuterol (VENTOLIN HFA) 108 (90 Base) MCG/ACT inhaler Inhale 2 puffs into the lungs every 6 (six) hours as needed for wheezing or shortness of breath. 06/30/21   Lindell Spar, MD  azelastine (ASTELIN) 0.1 % nasal spray 2 sprays per nostril 1-2 times daily as needed. 08/10/21   Valentina Shaggy, MD  buPROPion [facility] SR) 200 MG 12 hr tablet Take 1 tablet (200 mg total) by mouth 2 (two) times daily. Please take the second dose not later than 4 PM. 08/02/21 10/31/21  Plovsky,  Berneta Sages, MD  clonazePAM [facility] [place]) 1 MG tablet 1  qhs 08/02/21   Plovsky, Berneta Sages, MD  fluticasone (FLONASE) 50 MCG/ACT nasal spray Place 1 spray into both nostrils daily. 08/10/21   Valentina Shaggy, MD  levocetirizine (XYZAL) 5 MG tablet Take 1 tablet (5 mg total) by mouth 2 (two) times daily as needed for allergies (Can take an extra dose during flare). TAKE 1 TABLET BY MOUTH EVERY DAY IN THE EVENING 08/10/21   Valentina Shaggy, MD  montelukast (SINGULAIR) 10 MG tablet TAKE 1 TABLET BY MOUTH EVERYDAY AT BEDTIME 08/04/21   Lindell Spar, MD  pantoprazole (PROTONIX) 40 MG tablet Take 1 tablet (40 mg total) by mouth 2 (two) times daily before a meal. 08/22/21   Lindell Spar, MD  phentermine 37.5 MG capsule Take 1 capsule by mouth as directed. 08/03/21   [provider]  predniSONE (DELTASONE) 10 MG tablet Take 2 tablets (20 mg total) by mouth daily. 08/06/21   Sherrill Raring, PA-C  traZODone (DESYREL) 50 MG tablet Take 1 tablet by mouth as needed. 12/03/20   [provider]      Allergies    Amoxicillin, Asa [aspirin], Cefoxitin, and Sulfa antibiotics    Review of Systems   Review of Systems  Constitutional:  Negative for fever.  Genitourinary:  Positive for decreased urine volume, flank pain and urgency. Negative for dysuria and hematuria.  All other systems reviewed  and are negative.  Physical Exam Updated Vital Signs BP 115/75    Pulse 84    Temp 97.6 F (36.4 C) (Oral)    Resp 18    Ht 1.575 m (5\' 2" )    Wt 94.3 kg    SpO2 98%    BMI 38.04 kg/m  Physical Exam Vitals and nursing note reviewed.  Constitutional:      Appearance: She is well-developed. She is obese. She is not ill-appearing.  HENT:     Head: Normocephalic and atraumatic.     Mouth/Throat:     Mouth: Mucous membranes are moist.  Eyes:     Pupils: Pupils are equal, round, and reactive to light.  Cardiovascular:     Rate and Rhythm: Normal rate and regular rhythm.     Heart sounds: Normal heart sounds.   Pulmonary:     Effort: Pulmonary effort is normal. No respiratory distress.     Breath sounds: No wheezing.  Abdominal:     General: Bowel sounds are normal.     Palpations: Abdomen is soft.     Tenderness: There is no abdominal tenderness. There is no right CVA tenderness or left CVA tenderness.  Musculoskeletal:     Cervical back: Neck supple.  Skin:    General: Skin is warm and dry.  Neurological:     Mental Status: She is alert and oriented to person, place, and time.  Psychiatric:        Mood and Affect: Mood normal.    ED Results / Procedures / Treatments   Labs (all labs ordered are listed, but only abnormal results are displayed) Labs Reviewed  URINALYSIS, ROUTINE W REFLEX MICROSCOPIC - Abnormal; Notable for the following components:      Result Value   Color, Urine STRAW (*)    Specific Gravity, Urine 1.003 (*)    Hgb urine dipstick MODERATE (*)    All other components within normal limits  BASIC METABOLIC PANEL - Abnormal; Notable for the following components:   Potassium 3.2 (*)    All other components within normal limits  PREGNANCY, URINE  CBC WITH DIFFERENTIAL/PLATELET    EKG None  Radiology DG Abdomen 1 View  Result Date: 08/23/2021 CLINICAL DATA:  Right flank pain. EXAM: ABDOMEN - 1 VIEW COMPARISON:  October 18, 2016 FINDINGS: The bowel gas pattern is normal. Subcentimeter phleboliths are suspected within the lower pelvis. IMPRESSION: Suspected subcentimeter pelvic phleboliths. The presence of a distal ureteral renal calculus cannot be excluded. CT correlation is recommended if clinical symptoms persist. Electronically Signed   By: Virgina Norfolk M.D.   On: 08/23/2021 00:31   CT Renal Stone Study  Result Date: 08/23/2021 CLINICAL DATA:  Right flank pain. EXAM: CT ABDOMEN AND PELVIS WITHOUT CONTRAST TECHNIQUE: Multidetector CT imaging of the abdomen and pelvis was performed following the standard protocol without IV contrast. RADIATION DOSE REDUCTION:  This exam was performed according to the departmental dose-optimization program which includes automated exposure control, adjustment of the mA and/or kV according to patient size and/or use of iterative reconstruction technique. COMPARISON:  September 21, 2019 FINDINGS: Lower chest: No acute abnormality. Hepatobiliary: No focal liver abnormality is seen. No gallstones, gallbladder wall thickening, or biliary dilatation. Pancreas: Unremarkable. No pancreatic ductal dilatation or surrounding inflammatory changes. Spleen: Normal in size without focal abnormality. Adrenals/Urinary Tract: Adrenal glands are unremarkable. Kidneys are normal in size, without focal lesions. A 3 mm obstructing renal calculus is seen at the right UVJ (axial CT image 78, CT  series 2). Mild right-sided hydronephrosis is noted. The urinary bladder is poorly distended and subsequently limited in evaluation. Stomach/Bowel: Stomach is within normal limits. The appendix is surgically absent. No evidence of bowel wall thickening, distention, or inflammatory changes. Vascular/Lymphatic: No significant vascular findings are present. No enlarged abdominal or pelvic lymph nodes. Reproductive: Uterus and bilateral adnexa are unremarkable. Other: No abdominal wall hernia or abnormality. No abdominopelvic ascites. Musculoskeletal: No acute or significant osseous findings. IMPRESSION: 3 mm obstructing renal calculus at the right UVJ with associated mild right-sided hydronephrosis. Electronically Signed   By: Virgina Norfolk M.D.   On: 08/23/2021 02:13    Procedures Procedures    Medications Ordered in ED Medications  oxyCODONE-acetaminophen (PERCOCET/ROXICET) 5-325 MG per tablet 1 tablet (has no administration in time range)  sodium chloride 0.9 % bolus 1,000 mL (1,000 mLs Intravenous New Bag/Given 08/22/21 2347)  morphine (PF) 4 MG/ML injection 4 mg (4 mg Intravenous Given 08/22/21 2342)  ondansetron (ZOFRAN) injection 4 mg (4 mg Intravenous Given  08/22/21 2340)  ketorolac (TORADOL) 30 MG/ML injection 30 mg (30 mg Intravenous Given 08/22/21 2341)    ED Course/ Medical Decision Making/ A&P                           Medical Decision Making Amount and/or Complexity of Data Reviewed Labs: ordered. Radiology: ordered.  Risk Prescription drug management.   This patient presents to the ED for concern of right-sided flank pain, this involves an extensive number of treatment options, and is a complaint that carries with it a high risk of complications and morbidity.  The differential diagnosis includes kidney stone, pyelonephritis, UTI, less likely appendicitis  MDM:    This is a 31 year old female who presents with right-sided flank pain.  Consistent with prior kidney stones.  She is nontoxic are reassuring.  She is afebrile.  She does report some urinary symptoms as well.  Urinalysis obtained.  No evidence of UTI.  Creatinine preserved.  Patient given fluids and nausea medication.  Also given morphine and Toradol for pain.  X-ray is nondiagnostic for kidney stones.  CT stone study ordered and shows a 3 mm right obstructing ureteral stone.  This is likely the culprit.  On recheck, patient reports that she is improved.  She is able to tolerate fluids.  We discussed expectant management. (Labs, imaging)  Labs: I Ordered, and personally interpreted labs.  The pertinent results include: CBC, BMP, urinalysis all reassuring  Imaging Studies ordered: I ordered imaging studies including CT stone study with 3 mm obstructing stone I independently visualized and interpreted imaging. I agree with the radiologist interpretation  Additional history obtained from chart review.  External records from outside source obtained and reviewed including our visits and prior CT imaging  Critical Interventions: IV fluids and pain medication  Consultations: I requested consultation with the NA,  and discussed lab and imaging findings as well as pertinent  plan - they recommend: N/A  Cardiac Monitoring: The patient was maintained on a cardiac monitor.  I personally viewed and interpreted the cardiac monitored which showed an underlying rhythm of: Normal sinus rhythm  Reevaluation: After the interventions noted above, I reevaluated the patient and found that they have :improved   Considered admission for: Intractable pain  Social Determinants of Health: Lives independently  Disposition: Discharged with urology follow-up  Co morbidities that complicate the patient evaluation  Past Medical History:  Diagnosis Date   Asthma    Bipolar  disorder ([place])    Generalized anxiety disorder    GERD (gastroesophageal reflux disease)    [employer]reesia 08/17/2020   IBS (irritable bowel syndrome)    Plantar fasciitis 10/15/2018   Rectal bleeding 09/24/2019   Social anxiety disorder      Medicines Meds ordered this encounter  Medications   sodium chloride 0.9 % bolus 1,000 mL   morphine (PF) 4 MG/ML injection 4 mg   ondansetron (ZOFRAN) injection 4 mg   ketorolac (TORADOL) 30 MG/ML injection 30 mg   oxyCODONE-acetaminophen (PERCOCET/ROXICET) 5-325 MG per tablet 1 tablet   oxyCODONE-acetaminophen (PERCOCET/ROXICET) 5-325 MG tablet    Sig: Take 1 tablet by mouth every 6 (six) hours as needed for severe pain.    Dispense:  10 tablet    Refill:  0   ondansetron (ZOFRAN-ODT) 4 MG disintegrating tablet    Sig: Take 1 tablet (4 mg total) by mouth every 8 (eight) hours as needed for nausea or vomiting.    Dispense:  20 tablet    Refill:  0    I have reviewed the patients home medicines and have made adjustments as needed  Problem List / ED Course: Problem List Items Addressed This Visit   None Visit Diagnoses     Kidney stone    -  Primary   Relevant Medications   morphine (PF) 4 MG/ML injection 4 mg (Completed)   oxyCODONE-acetaminophen (PERCOCET/ROXICET) 5-325 MG per tablet 1 tablet (Start on 08/23/2021  2:30 AM)   oxyCODONE-acetaminophen  (PERCOCET/ROXICET) 5-325 MG tablet                     Final Clinical Impression(s) / ED Diagnoses Final diagnoses:  Kidney stone    Rx / DC Orders ED Discharge Orders          Ordered    oxyCODONE-acetaminophen (PERCOCET/ROXICET) 5-325 MG tablet  Every 6 hours PRN        08/23/21 0222    ondansetron (ZOFRAN-ODT) 4 MG disintegrating tablet  Every 8 hours PRN        08/23/21 0222              Merryl Hacker, MD 08/23/21 [id number]

## 2021-08-22 NOTE — ED Triage Notes (Signed)
Pt c/o right flank pain and pressure when she goes to urinate. Hx of kidney stone.

## 2021-08-23 ENCOUNTER — Other Ambulatory Visit: Payer: Self-pay

## 2021-08-23 ENCOUNTER — Encounter: Payer: Self-pay | Admitting: Urology

## 2021-08-23 ENCOUNTER — Emergency Department (HOSPITAL_COMMUNITY): Payer: BLUE CROSS/BLUE SHIELD

## 2021-08-23 ENCOUNTER — Ambulatory Visit: Payer: BLUE CROSS/BLUE SHIELD | Admitting: Urology

## 2021-08-23 VITALS — BP 124/87 | HR 78 | Wt 211.4 lb

## 2021-08-23 DIAGNOSIS — N201 Calculus of ureter: Secondary | ICD-10-CM | POA: Diagnosis not present

## 2021-08-23 LAB — URINALYSIS, ROUTINE W REFLEX MICROSCOPIC
Bilirubin, UA: NEGATIVE
Glucose, UA: NEGATIVE
Ketones, UA: NEGATIVE
Leukocytes,UA: NEGATIVE
Nitrite, UA: NEGATIVE
Specific Gravity, UA: >1.03 — ABNORMAL HIGH (ref 1.005–1.030)
Urobilinogen, Ur: 0.2 mg/dL (ref 0.2–1.0)
pH, UA: 5.5 (ref 5.0–7.5)

## 2021-08-23 LAB — CBC WITH DIFFERENTIAL/PLATELET
Abs Immature Granulocytes: 0.03 10*3/uL (ref 0.00–0.07)
Basophils Absolute: 0 10*3/uL (ref 0.0–0.1)
Basophils Relative: 0 %
Eosinophils Absolute: 0 10*3/uL (ref 0.0–0.5)
Eosinophils Relative: 0 %
HCT: 41.1 % (ref 36.0–46.0)
Hemoglobin: 13.8 g/dL (ref 12.0–15.0)
Immature Granulocytes: 0 %
Lymphocytes Relative: 39 %
Lymphs Abs: 3.6 10*3/uL (ref 0.7–4.0)
MCH: 28.5 pg (ref 26.0–34.0)
MCHC: 33.6 g/dL (ref 30.0–36.0)
MCV: 84.9 fL (ref 80.0–100.0)
Monocytes Absolute: 0.6 10*3/uL (ref 0.1–1.0)
Monocytes Relative: 6 %
Neutro Abs: 5 10*3/uL (ref 1.7–7.7)
Neutrophils Relative %: 55 %
Platelets: 304 10*3/uL (ref 150–400)
RBC: 4.84 MIL/uL (ref 3.87–5.11)
RDW: 13.2 % (ref 11.5–15.5)
WBC: 9.3 10*3/uL (ref 4.0–10.5)
nRBC: 0 % (ref 0.0–0.2)

## 2021-08-23 LAB — MICROSCOPIC EXAMINATION: Renal Epithel, UA: NONE SEEN /hpf

## 2021-08-23 LAB — BASIC METABOLIC PANEL
Anion gap: 11 (ref 5–15)
BUN: 9 mg/dL (ref 6–20)
CO2: 22 mmol/L (ref 22–32)
Calcium: 9.3 mg/dL (ref 8.9–10.3)
Chloride: 102 mmol/L (ref 98–111)
Creatinine, Ser: 0.78 mg/dL (ref 0.44–1.00)
GFR, Estimated: >60 mL/min (ref >60)
Glucose, Bld: 84 mg/dL (ref 70–99)
Potassium: 3.2 mmol/L — ABNORMAL LOW (ref 3.5–5.1)
Sodium: 135 mmol/L (ref 135–145)

## 2021-08-23 MED ORDER — ONDANSETRON 4 MG PO TBDP: 4.0000 mg | ORAL_TABLET | Freq: Three times a day (TID) | ORAL | 0 refills | Status: DC | PRN

## 2021-08-23 MED ORDER — TAMSULOSIN HCL 0.4 MG PO CAPS: 0.4000 mg | ORAL_CAPSULE | Freq: Every day | ORAL | 0 refills | Status: DC

## 2021-08-23 MED ORDER — OXYCODONE-ACETAMINOPHEN 5-325 MG PO TABS
1.0000 | ORAL_TABLET | Freq: Once | ORAL | Status: AC
  Administered 2021-08-23: 1 via ORAL
  Filled 2021-08-23: qty 1

## 2021-08-23 MED ORDER — OXYCODONE-ACETAMINOPHEN 5-325 MG PO TABS: 1.0000 | ORAL_TABLET | Freq: Four times a day (QID) | ORAL | 0 refills | Status: DC | PRN

## 2021-08-23 NOTE — Discharge Instructions (Signed)
Make sure that you are staying well-hydrated.  Take pain medication as prescribed.  Do not drive while taking pain medication.

## 2021-08-23 NOTE — H&P (View-Only) (Signed)
Assessment: 1. Ureteral calculus, right    Plan: I personally reviewed the CT study from 08/23/21 showing a 2 x 3 mm calculus at the right UVJ with associated obstruction.  I also reviewed the records from her emergency room visit including lab results. Options for management of the ureteral calculus discussed with the patient today including spontaneous passage, shockwave lithotripsy, and ureteroscopic stone manipulation.  Given the small stone size and location in the distal ureter, I cited a greater than 90% probability of stone passage.  She would like to attempt spontaneous stone passage. Continue pain medication as needed. Begin tamsulosin 0.4 mg for MET.  Off label use discussed. Strain urine. Return to office in 1 week for reevaluation.  Chief Complaint:  Chief Complaint  Patient presents with   ureteral calculus    History of Present Illness:  Nicole Rodriguez is a 31 y.o. year old female who is seen  for evaluation of right ureteral calculus.  She had acute onset of right-sided flank pain yesterday.  This was preceded by approximately 24 hours of urinary frequency and urethral discomfort.  She had associated nausea without vomiting.  No fevers or chills.  She presented to the emergency room on 08/22/2021.  Urinalysis showed a pH of 6.0, 0-5 RBCs, 0-5 WBCs.  WBC 9.3.  Creatinine 0.78.  CT imaging showed a 2 x 3 mm calculus at the right UVJ with associated obstructive changes.  No other stones identified.  She was treated with pain medication and discharged.  She currently reports a pain of 7/10 in the right flank area.  She has not taken any pain medication since leaving the emergency room. She has a prior history of a kidney stone which she passed spontaneously approximately 5 years ago.    Past Medical History:  Past Medical History:  Diagnosis Date   Asthma    Bipolar disorder ([place])    Generalized anxiety disorder    GERD (gastroesophageal reflux disease)    [employer]rees[employer]  08/17/2020   IBS (irritable bowel syndrome)    Plantar fasciitis 10/15/2018   Rectal bleeding 09/24/2019   Social anxiety disorder     Past Surgical History:  Past Surgical History:  Procedure Laterality Date   APPENDECTOMY     BREAST LUMPECTOMY     COLONOSCOPY WITH PROPOFOL N/A 12/03/2019   Procedure: COLONOSCOPY WITH PROPOFOL;  Surgeon: Virgel Manifold, MD;  Location: [facility] ENDOSCOPY;  Service: Endoscopy;  Laterality: N/A;   ESOPHAGOGASTRODUODENOSCOPY (EGD) WITH PROPOFOL N/A 12/03/2019   Procedure: ESOPHAGOGASTRODUODENOSCOPY (EGD) WITH PROPOFOL;  Surgeon: Virgel Manifold, MD;  Location: [facility] ENDOSCOPY;  Service: Endoscopy;  Laterality: N/A;   FOOT SURGERY      Allergies:  Allergies  Allergen Reactions   Amoxicillin Hives   Asa [Aspirin] Hives   Cefoxitin Itching and Rash   Sulfa Antibiotics Hives    Family History:  Family History  Problem Relation Age of Onset   Allergic rhinitis Father    Asthma Father    Crohn's disease Sister        half sister   Anxiety disorder Maternal Grandmother    Depression Maternal Grandmother    Diverticulitis Paternal Grandmother    Diabetes Other    Heart failure Other    Colon cancer Neg Hx    Celiac disease Neg Hx     Social History:  Social History   Tobacco Use   Smoking status: Never    Passive exposure: Never   Smokeless tobacco: Never  Vaping Use  Vaping Use: Never used  Substance Use Topics   Alcohol use: Yes    Comment: socially    Drug use: No    Review of symptoms:  Constitutional:  Negative for unexplained weight loss, night sweats, fever, chills ENT:  Negative for nose bleeds, sinus pain, painful swallowing CV:  Negative for chest pain, shortness of breath, exercise intolerance, palpitations, loss of consciousness Resp:  Negative for cough, wheezing, shortness of breath GI:  Negative for nausea, vomiting, diarrhea, bloody stools GU:  Positives noted in HPI; otherwise negative for gross hematuria,  urinary incontinence Neuro:  Negative for seizures, poor balance, limb weakness, slurred speech Psych:  Negative for lack of energy, depression, anxiety Endocrine:  Negative for polydipsia, polyuria, symptoms of hypoglycemia (dizziness, hunger, sweating) Hematologic:  Negative for anemia, purpura, petechia, prolonged or excessive bleeding, use of anticoagulants  Allergic:  Negative for difficulty breathing or choking as a result of exposure to anything; no shellfish allergy; no allergic response (rash/itch) to materials, foods  Physical exam: BP 124/87    Pulse 78    Wt 211 lb 6.4 oz (95.9 kg)    BMI 38.67 kg/m  GENERAL APPEARANCE:  Well appearing, well developed, well nourished, NAD HEENT: Atraumatic, Normocephalic, oropharynx clear. NECK: Supple without lymphadenopathy or thyromegaly. LUNGS: Clear to auscultation bilaterally. HEART: Regular Rate and Rhythm without murmurs, gallops, or rubs. ABDOMEN: Soft, non-tender, No Masses. EXTREMITIES: Moves all extremities well.  Without clubbing, cyanosis, or edema. NEUROLOGIC:  Alert and oriented x 3, normal gait, CN II-XII grossly intact.  MENTAL STATUS:  Appropriate. BACK:  Non-tender to palpation.  No CVAT SKIN:  Warm, dry and intact.    Results: Results for orders placed or performed during the hospital encounter of 08/22/21 (from the past 24 hour(s))  Urinalysis, Routine w reflex microscopic Urine, Clean Catch   Collection Time: 08/22/21  9:17 PM  Result Value Ref Range   Color, Urine STRAW (A) YELLOW   APPearance CLEAR CLEAR   Specific Gravity, Urine 1.003 (L) 1.005 - 1.030   pH 6.0 5.0 - 8.0   Glucose, UA NEGATIVE NEGATIVE mg/dL   Hgb urine dipstick MODERATE (A) NEGATIVE   Bilirubin Urine NEGATIVE NEGATIVE   Ketones, ur NEGATIVE NEGATIVE mg/dL   Protein, ur NEGATIVE NEGATIVE mg/dL   Nitrite NEGATIVE NEGATIVE   Leukocytes,Ua NEGATIVE NEGATIVE   RBC / HPF 0-5 0 - 5 RBC/hpf   WBC, UA 0-5 0 - 5 WBC/hpf   Bacteria, UA NONE SEEN  NONE SEEN   Squamous Epithelial / LPF 0-5 0 - 5  Pregnancy, urine   Collection Time: 08/22/21  9:17 PM  Result Value Ref Range   Preg Test, Ur NEGATIVE NEGATIVE  CBC with Differential   Collection Time: 08/22/21 11:38 PM  Result Value Ref Range   WBC 9.3 4.0 - 10.5 K/uL   RBC 4.84 3.87 - 5.11 MIL/uL   Hemoglobin 13.8 12.0 - 15.0 g/dL   HCT 41.1 36.0 - 46.0 %   MCV 84.9 80.0 - 100.0 fL   MCH 28.5 26.0 - 34.0 pg   MCHC 33.6 30.0 - 36.0 g/dL   RDW 13.2 11.5 - 15.5 %   Platelets 304 150 - 400 K/uL   nRBC 0.0 0.0 - 0.2 %   Neutrophils Relative % 55 %   Neutro Abs 5.0 1.7 - 7.7 K/uL   Lymphocytes Relative 39 %   Lymphs Abs 3.6 0.7 - 4.0 K/uL   Monocytes Relative 6 %   Monocytes Absolute 0.6 0.1 -  1.0 K/uL   Eosinophils Relative 0 %   Eosinophils Absolute 0.0 0.0 - 0.5 K/uL   Basophils Relative 0 %   Basophils Absolute 0.0 0.0 - 0.1 K/uL   Immature Granulocytes 0 %   Abs Immature Granulocytes 0.03 0.00 - 0.07 K/uL  Basic metabolic panel   Collection Time: 08/22/21 11:38 PM  Result Value Ref Range   Sodium 135 135 - 145 mmol/L   Potassium 3.2 (L) 3.5 - 5.1 mmol/L   Chloride 102 98 - 111 mmol/L   CO2 22 22 - 32 mmol/L   Glucose, Bld 84 70 - 99 mg/dL   BUN 9 6 - 20 mg/dL   Creatinine, Ser 0.78 0.44 - 1.00 mg/dL   Calcium 9.3 8.9 - 10.3 mg/dL   GFR, Estimated >60 >60 mL/min   Anion gap 11 5 - 15

## 2021-08-23 NOTE — Progress Notes (Signed)
Assessment: 1. Ureteral calculus, right    Plan: I personally reviewed the CT study from 08/23/21 showing a 2 x 3 mm calculus at the right UVJ with associated obstruction.  I also reviewed the records from her emergency room visit including lab results. Options for management of the ureteral calculus discussed with the patient today including spontaneous passage, shockwave lithotripsy, and ureteroscopic stone manipulation.  Given the small stone size and location in the distal ureter, I cited a greater than 90% probability of stone passage.  She would like to attempt spontaneous stone passage. Continue pain medication as needed. Begin tamsulosin 0.4 mg for MET.  Off label use discussed. Strain urine. Return to office in 1 week for reevaluation.  Chief Complaint:  Chief Complaint  Patient presents with   ureteral calculus    History of Present Illness:  Nicole Rodriguez is a 31 y.o. year old female who is seen  for evaluation of right ureteral calculus.  She had acute onset of right-sided flank pain yesterday.  This was preceded by approximately 24 hours of urinary frequency and urethral discomfort.  She had associated nausea without vomiting.  No fevers or chills.  She presented to the emergency room on 08/22/2021.  Urinalysis showed a pH of 6.0, 0-5 RBCs, 0-5 WBCs.  WBC 9.3.  Creatinine 0.78.  CT imaging showed a 2 x 3 mm calculus at the right UVJ with associated obstructive changes.  No other stones identified.  She was treated with pain medication and discharged.  She currently reports a pain of 7/10 in the right flank area.  She has not taken any pain medication since leaving the emergency room. She has a prior history of a kidney stone which she passed spontaneously approximately 5 years ago.    Past Medical History:  Past Medical History:  Diagnosis Date   Asthma    Bipolar disorder ([place])    Generalized anxiety disorder    GERD (gastroesophageal reflux disease)    [employer]  08/17/2020   IBS (irritable bowel syndrome)    Plantar fasciitis 10/15/2018   Rectal bleeding 09/24/2019   Social anxiety disorder     Past Surgical History:  Past Surgical History:  Procedure Laterality Date   APPENDECTOMY     BREAST LUMPECTOMY     COLONOSCOPY WITH PROPOFOL N/A 12/03/2019   Procedure: COLONOSCOPY WITH PROPOFOL;  Surgeon: Virgel Manifold, MD;  Location: [facility] ENDOSCOPY;  Service: Endoscopy;  Laterality: N/A;   ESOPHAGOGASTRODUODENOSCOPY (EGD) WITH PROPOFOL N/A 12/03/2019   Procedure: ESOPHAGOGASTRODUODENOSCOPY (EGD) WITH PROPOFOL;  Surgeon: Virgel Manifold, MD;  Location: [facility] ENDOSCOPY;  Service: Endoscopy;  Laterality: N/A;   FOOT SURGERY      Allergies:  Allergies  Allergen Reactions   Amoxicillin Hives   Asa [Aspirin] Hives   Cefoxitin Itching and Rash   Sulfa Antibiotics Hives    Family History:  Family History  Problem Relation Age of Onset   Allergic rhinitis Father    Asthma Father    Crohn's disease Sister        half sister   Anxiety disorder Maternal Grandmother    Depression Maternal Grandmother    Diverticulitis Paternal Grandmother    Diabetes Other    Heart failure Other    Colon cancer Neg Hx    Celiac disease Neg Hx     Social History:  Social History   Tobacco Use   Smoking status: Never    Passive exposure: Never   Smokeless tobacco: Never  Vaping Use  Vaping Use: Never used  Substance Use Topics   Alcohol use: Yes    Comment: socially    Drug use: No    Review of symptoms:  Constitutional:  Negative for unexplained weight loss, night sweats, fever, chills ENT:  Negative for nose bleeds, sinus pain, painful swallowing CV:  Negative for chest pain, shortness of breath, exercise intolerance, palpitations, loss of consciousness Resp:  Negative for cough, wheezing, shortness of breath GI:  Negative for nausea, vomiting, diarrhea, bloody stools GU:  Positives noted in HPI; otherwise negative for gross hematuria,  urinary incontinence Neuro:  Negative for seizures, poor balance, limb weakness, slurred speech Psych:  Negative for lack of energy, depression, anxiety Endocrine:  Negative for polydipsia, polyuria, symptoms of hypoglycemia (dizziness, hunger, sweating) Hematologic:  Negative for anemia, purpura, petechia, prolonged or excessive bleeding, use of anticoagulants  Allergic:  Negative for difficulty breathing or choking as a result of exposure to anything; no shellfish allergy; no allergic response (rash/itch) to materials, foods  Physical exam: BP 124/87    Pulse 78    Wt 211 lb 6.4 oz (95.9 kg)    BMI 38.67 kg/m  GENERAL APPEARANCE:  Well appearing, well developed, well nourished, NAD HEENT: Atraumatic, Normocephalic, oropharynx clear. NECK: Supple without lymphadenopathy or thyromegaly. LUNGS: Clear to auscultation bilaterally. HEART: Regular Rate and Rhythm without murmurs, gallops, or rubs. ABDOMEN: Soft, non-tender, No Masses. EXTREMITIES: Moves all extremities well.  Without clubbing, cyanosis, or edema. NEUROLOGIC:  Alert and oriented x 3, normal gait, CN II-XII grossly intact.  MENTAL STATUS:  Appropriate. BACK:  Non-tender to palpation.  No CVAT SKIN:  Warm, dry and intact.    Results: Results for orders placed or performed during the hospital encounter of 08/22/21 (from the past 24 hour(s))  Urinalysis, Routine w reflex microscopic Urine, Clean Catch   Collection Time: 08/22/21  9:17 PM  Result Value Ref Range   Color, Urine STRAW (A) YELLOW   APPearance CLEAR CLEAR   Specific Gravity, Urine 1.003 (L) 1.005 - 1.030   pH 6.0 5.0 - 8.0   Glucose, UA NEGATIVE NEGATIVE mg/dL   Hgb urine dipstick MODERATE (A) NEGATIVE   Bilirubin Urine NEGATIVE NEGATIVE   Ketones, ur NEGATIVE NEGATIVE mg/dL   Protein, ur NEGATIVE NEGATIVE mg/dL   Nitrite NEGATIVE NEGATIVE   Leukocytes,Ua NEGATIVE NEGATIVE   RBC / HPF 0-5 0 - 5 RBC/hpf   WBC, UA 0-5 0 - 5 WBC/hpf   Bacteria, UA NONE SEEN  NONE SEEN   Squamous Epithelial / LPF 0-5 0 - 5  Pregnancy, urine   Collection Time: 08/22/21  9:17 PM  Result Value Ref Range   Preg Test, Ur NEGATIVE NEGATIVE  CBC with Differential   Collection Time: 08/22/21 11:38 PM  Result Value Ref Range   WBC 9.3 4.0 - 10.5 K/uL   RBC 4.84 3.87 - 5.11 MIL/uL   Hemoglobin 13.8 12.0 - 15.0 g/dL   HCT 41.1 36.0 - 46.0 %   MCV 84.9 80.0 - 100.0 fL   MCH 28.5 26.0 - 34.0 pg   MCHC 33.6 30.0 - 36.0 g/dL   RDW 13.2 11.5 - 15.5 %   Platelets 304 150 - 400 K/uL   nRBC 0.0 0.0 - 0.2 %   Neutrophils Relative % 55 %   Neutro Abs 5.0 1.7 - 7.7 K/uL   Lymphocytes Relative 39 %   Lymphs Abs 3.6 0.7 - 4.0 K/uL   Monocytes Relative 6 %   Monocytes Absolute 0.6 0.1 -  1.0 K/uL   Eosinophils Relative 0 %   Eosinophils Absolute 0.0 0.0 - 0.5 K/uL   Basophils Relative 0 %   Basophils Absolute 0.0 0.0 - 0.1 K/uL   Immature Granulocytes 0 %   Abs Immature Granulocytes 0.03 0.00 - 0.07 K/uL  Basic metabolic panel   Collection Time: 08/22/21 11:38 PM  Result Value Ref Range   Sodium 135 135 - 145 mmol/L   Potassium 3.2 (L) 3.5 - 5.1 mmol/L   Chloride 102 98 - 111 mmol/L   CO2 22 22 - 32 mmol/L   Glucose, Bld 84 70 - 99 mg/dL   BUN 9 6 - 20 mg/dL   Creatinine, Ser 0.78 0.44 - 1.00 mg/dL   Calcium 9.3 8.9 - 10.3 mg/dL   GFR, Estimated >60 >60 mL/min   Anion gap 11 5 - 15

## 2021-08-24 NOTE — Telephone Encounter (Signed)
Spoke to patient and advised approval, copay card and submit

## 2021-08-25 ENCOUNTER — Other Ambulatory Visit (HOSPITAL_COMMUNITY): Payer: Self-pay | Admitting: Psychiatry

## 2021-08-25 ENCOUNTER — Other Ambulatory Visit: Payer: Self-pay | Admitting: Urology

## 2021-08-25 ENCOUNTER — Encounter: Payer: Self-pay | Admitting: Urology

## 2021-08-25 DIAGNOSIS — N201 Calculus of ureter: Secondary | ICD-10-CM

## 2021-08-25 MED ORDER — OXYCODONE-ACETAMINOPHEN 5-325 MG PO TABS: 1.0000 | ORAL_TABLET | Freq: Four times a day (QID) | ORAL | 0 refills | Status: DC | PRN

## 2021-08-25 NOTE — Progress Notes (Signed)
Work note sent via mychart. ?

## 2021-08-28 NOTE — Telephone Encounter (Signed)
Please see patient message below

## 2021-08-29 ENCOUNTER — Telehealth: Payer: Self-pay

## 2021-08-29 ENCOUNTER — Other Ambulatory Visit: Payer: Self-pay | Admitting: Urology

## 2021-08-29 ENCOUNTER — Other Ambulatory Visit: Payer: Self-pay | Admitting: Internal Medicine

## 2021-08-29 DIAGNOSIS — N201 Calculus of ureter: Secondary | ICD-10-CM

## 2021-08-29 MED ORDER — OXYCODONE-ACETAMINOPHEN 5-325 MG PO TABS: 1.0000 | ORAL_TABLET | Freq: Four times a day (QID) | ORAL | 0 refills | Status: DC | PRN

## 2021-08-29 NOTE — Progress Notes (Signed)
Surgical Physician Order Form [facility] [place]  * Scheduling expectation :  08/31/21  *Length of Case: 60 minutes  *MD Preforming Case: Michaelle Birks, MD  *Assistant Needed: no  *Facility Preference: [facility]  *Clearance needed: no  *Anticoagulation Instructions: N/A  *Aspirin Instructions: N/A  -Admit type: OUTpatient  -Anesthesia: General  -Use Standing Orders:  none  *Diagnosis: Right Ureteral Stone  *Procedure: right Ureteroscopy w/laser lithotripsy & stent placement (38333)  Additional orders: N/A  -Equipment: C-Arm, Holmium laser  -VTE Prophylaxis Standing Order SCDs       Other:   -Standing Lab Orders Per Anesthesia    Lab other: None  -Standing Test orders EKG/Chest x-ray per Anesthesia       Test other:   - Medications:  Cipro 400mg  IV  -Other orders:  N/A  *Post-op visit Date/Instructions:  1 week cysto stent removal

## 2021-08-29 NOTE — Progress Notes (Signed)
I spoke with Nicole Rodriguez. We have discussed possible surgery dates and 08/31/2021 was agreed upon by all parties. Patient given information about surgery date, what to expect pre-operatively and post operatively.    We discussed that a pre-op nurse will be calling to set up the pre-op visit that will take place prior to surgery. Informed patient that our office will communicate any additional care to be provided after surgery.    Patients questions or concerns were discussed during our call. Advised to call our office should there be any additional information, questions or concerns that arise. Patient verbalized understanding.

## 2021-08-29 NOTE — Progress Notes (Signed)
[facility] Surgery Posting Form   Surgery Date/Time: Date: 08/31/2021  Surgeon: Dr. Michaelle Birks, MD  Surgery Location: Day Surgery  Inpt ( No  )   Outpt (Yes)   Obs ( No  )   Diagnosis: N20.1 Right Ureteral Joaquim Lai  -CPT: [id number]  Surgery: Right Ureteroscopy with laser lithotripsy and stent placement with retrograde pyelogram.   Stop Anticoagulations: No  Cardiac/Medical/Pulmonary Clearance needed: no  *Orders entered into EPIC  Date: 08/29/21   *Case booked in [place]  Date: 08/29/21  *Notified pt of Surgery: Date: 08/29/21  PRE-OP UA & CX: no  *Placed into Prior Authorization Work [place] Date: 08/29/21   Assistant/laser/rep:No

## 2021-08-29 NOTE — Telephone Encounter (Signed)
Can you send me the posting? oR did it go to [place]?

## 2021-08-29 NOTE — Telephone Encounter (Signed)
I called patient to review medications prior to surgery  Patient reports she has not taken the Phentermine since beginning of February due to side effects.

## 2021-08-30 ENCOUNTER — Encounter (HOSPITAL_COMMUNITY): Payer: Self-pay

## 2021-08-30 ENCOUNTER — Ambulatory Visit: Payer: BLUE CROSS/BLUE SHIELD | Admitting: Physician Assistant

## 2021-08-30 ENCOUNTER — Encounter (HOSPITAL_COMMUNITY)
Admission: RE | Admit: 2021-08-30 | Discharge: 2021-08-30 | Disposition: A | Discharge status: Home / Self Care | Payer: BLUE CROSS/BLUE SHIELD | Source: Ambulatory Visit | Attending: Urology | Admitting: Urology

## 2021-08-30 VITALS — BP 117/79 | HR 82 | Temp 98.5°F | Resp 18 | Ht 62.0 in | Wt 208.0 lb

## 2021-08-30 DIAGNOSIS — Z01818 Encounter for other preprocedural examination: Secondary | ICD-10-CM

## 2021-08-30 DIAGNOSIS — F319 Bipolar disorder, unspecified: Secondary | ICD-10-CM | POA: Diagnosis not present

## 2021-08-30 DIAGNOSIS — Z01812 Encounter for preprocedural laboratory examination: Secondary | ICD-10-CM | POA: Insufficient documentation

## 2021-08-30 DIAGNOSIS — J45909 Unspecified asthma, uncomplicated: Secondary | ICD-10-CM | POA: Diagnosis not present

## 2021-08-30 DIAGNOSIS — K219 Gastro-esophageal reflux disease without esophagitis: Secondary | ICD-10-CM | POA: Diagnosis not present

## 2021-08-30 DIAGNOSIS — N201 Calculus of ureter: Secondary | ICD-10-CM | POA: Diagnosis not present

## 2021-08-30 DIAGNOSIS — F419 Anxiety disorder, unspecified: Secondary | ICD-10-CM | POA: Diagnosis not present

## 2021-08-30 LAB — PREGNANCY, URINE: Preg Test, Ur: NEGATIVE

## 2021-08-30 NOTE — Patient Instructions (Signed)
Your procedure is scheduled on: 08/31/2021  Report to [facility] at   7:30  AM.  Call this number if you have problems the morning of surgery: [phone]   Remember:   Do not Eat or Drink after midnight         No Smoking the morning of surgery  :  Take these medicines the morning of surgery with A SIP OF WATER: Klonopin and wellbutrin   Do not wear jewelry, make-up or nail polish.  Do not wear lotions, powders, or perfumes. You may wear deodorant.  Do not shave 48 hours prior to surgery. Men may shave face and neck.  Do not bring valuables to the hospital.  Contacts, dentures or bridgework may not be worn into surgery.  Leave suitcase in the car. After surgery it may be brought to your room.  For patients admitted to the hospital, checkout time is 11:00 AM the day of discharge.   Patients discharged the day of surgery will not be allowed to drive home.    Special Instructions: Shower using CHG night before surgery and shower the day of surgery use CHG.  Use special wash - you have one bottle of CHG for all showers.  You should use approximately 1/2 of the bottle for each shower.  How to Use Chlorhexidine for Bathing Chlorhexidine gluconate (CHG) is a germ-killing (antiseptic) solution that is used to clean the skin. It can get rid of the bacteria that normally live on the skin and can keep them away for about 24 hours. To clean your skin with CHG, you may be given: A CHG solution to use in the shower or as part of a sponge bath. A prepackaged cloth that contains CHG. Cleaning your skin with CHG may help lower the risk for infection: While you are staying in the intensive care unit of the hospital. If you have a vascular access, such as a central line, to provide short-term or long-term access to your veins. If you have a catheter to drain urine from your bladder. If you are on a ventilator. A ventilator is a machine that helps you breathe by moving air in and out of  your lungs. After surgery. What are the risks? Risks of using CHG include: A skin reaction. Hearing loss, if CHG gets in your ears and you have a perforated eardrum. Eye injury, if CHG gets in your eyes and is not rinsed out. The CHG product catching fire. Make sure that you avoid smoking and flames after applying CHG to your skin. Do not use CHG: If you have a chlorhexidine allergy or have previously reacted to chlorhexidine. On babies younger than 69 months of age. How to use CHG solution Use CHG only as told by your health care provider, and follow the instructions on the label. Use the full amount of CHG as directed. Usually, this is one bottle. During a shower Follow these steps when using CHG solution during a shower (unless your health care provider gives you different instructions): Start the shower. Use your normal soap and shampoo to wash your face and hair. Turn off the shower or move out of the shower stream. Pour the CHG onto a clean washcloth. Do not use any type of brush or rough-edged sponge. Starting at your neck, lather your body down to your toes. Make sure you follow these instructions: If you will be having surgery, pay special attention to the part of your body where you will be having  surgery. Scrub this area for at least 1 minute. Do not use CHG on your head or face. If the solution gets into your ears or eyes, rinse them well with water. Avoid your genital area. Avoid any areas of skin that have broken skin, cuts, or scrapes. Scrub your back and under your arms. Make sure to wash skin folds. Let the lather sit on your skin for 1-2 minutes or as long as told by your health care provider. Thoroughly rinse your entire body in the shower. Make sure that all body creases and crevices are rinsed well. Dry off with a clean towel. Do not put any substances on your body afterward--such as powder, lotion, or perfume--unless you are told to do so by your health care  provider. Only use lotions that are recommended by the manufacturer. Put on clean clothes or pajamas. If it is the night before your surgery, sleep in clean sheets.  During a sponge bath Follow these steps when using CHG solution during a sponge bath (unless your health care provider gives you different instructions): Use your normal soap and shampoo to wash your face and hair. Pour the CHG onto a clean washcloth. Starting at your neck, lather your body down to your toes. Make sure you follow these instructions: If you will be having surgery, pay special attention to the part of your body where you will be having surgery. Scrub this area for at least 1 minute. Do not use CHG on your head or face. If the solution gets into your ears or eyes, rinse them well with water. Avoid your genital area. Avoid any areas of skin that have broken skin, cuts, or scrapes. Scrub your back and under your arms. Make sure to wash skin folds. Let the lather sit on your skin for 1-2 minutes or as long as told by your health care provider. Using a different clean, wet washcloth, thoroughly rinse your entire body. Make sure that all body creases and crevices are rinsed well. Dry off with a clean towel. Do not put any substances on your body afterward--such as powder, lotion, or perfume--unless you are told to do so by your health care provider. Only use lotions that are recommended by the manufacturer. Put on clean clothes or pajamas. If it is the night before your surgery, sleep in clean sheets. How to use CHG prepackaged cloths Only use CHG cloths as told by your health care provider, and follow the instructions on the label. Use the CHG cloth on clean, dry skin. Do not use the CHG cloth on your head or face unless your health care provider tells you to. When washing with the CHG cloth: Avoid your genital area. Avoid any areas of skin that have broken skin, cuts, or scrapes. Before surgery Follow these steps  when using a CHG cloth to clean before surgery (unless your health care provider gives you different instructions): Using the CHG cloth, vigorously scrub the part of your body where you will be having surgery. Scrub using a back-and-forth motion for 3 minutes. The area on your body should be completely wet with CHG when you are done scrubbing. Do not rinse. Discard the cloth and let the area air-dry. Do not put any substances on the area afterward, such as powder, lotion, or perfume. Put on clean clothes or pajamas. If it is the night before your surgery, sleep in clean sheets.  For general bathing Follow these steps when using CHG cloths for general bathing (unless your health  care provider gives you different instructions). Use a separate CHG cloth for each area of your body. Make sure you wash between any folds of skin and between your fingers and toes. Wash your body in the following order, switching to a new cloth after each step: The front of your neck, shoulders, and chest. Both of your arms, under your arms, and your hands. Your stomach and groin area, avoiding the genitals. Your right leg and foot. Your left leg and foot. The back of your neck, your back, and your buttocks. Do not rinse. Discard the cloth and let the area air-dry. Do not put any substances on your body afterward--such as powder, lotion, or perfume--unless you are told to do so by your health care provider. Only use lotions that are recommended by the manufacturer. Put on clean clothes or pajamas. Contact a health care provider if: Your skin gets irritated after scrubbing. You have questions about using your solution or cloth. You swallow any chlorhexidine. Call your local poison control center (1-(573)195-0948 in the [place] Get help right away if: Your eyes itch badly, or they become very red or swollen. Your skin itches badly and is red or swollen. Your hearing changes. You have trouble seeing. You have swelling or  tingling in your mouth or throat. You have trouble breathing. These symptoms may represent a serious problem that is an emergency. Do not wait to see if the symptoms will go away. Get medical help right away. Call your local emergency services (911 in the [place] Do not drive yourself to the hospital. Summary Chlorhexidine gluconate (CHG) is a germ-killing (antiseptic) solution that is used to clean the skin. Cleaning your skin with CHG may help to lower your risk for infection. You may be given CHG to use for bathing. It may be in a bottle or in a prepackaged cloth to use on your skin. Carefully follow your health care provider's instructions and the instructions on the product label. Do not use CHG if you have a chlorhexidine allergy. Contact your health care provider if your skin gets irritated after scrubbing. This information is not intended to replace advice given to you by your health care provider. Make sure you discuss any questions you have with your health care provider. Document Revised: 08/30/2020 Document Reviewed: 08/30/2020 Elsevier Patient Education  2022 [employer]. Ureteroscopy Ureteroscopy is a procedure to check for and treat problems inside part of the urinary tract. In this procedure, a thin, flexible tube with a light at the end (ureteroscope) is used to look at the inside of the kidneys and the ureters. The ureters are the tubes that carry urine from the kidneys to the bladder. The ureteroscope is inserted into one or both of the ureters. You may need this procedure if you have frequent urinary tract infections (UTIs), blood in your urine, or a stone in one of your ureters. A ureteroscopy can be done: To find the cause of urine blockage in a ureter and to evaluate other abnormalities inside the ureters or kidneys. To remove stones. To remove or treat growths of tissue (polyps), abnormal tissue, and some types of tumors. To remove a tissue sample and check it for disease  under a microscope (biopsy). Tell a health care provider about: Any allergies you have. All medicines you are taking, including vitamins, herbs, eye drops, creams, and over-the-counter medicines. Any problems you or family members have had with anesthetic medicines. Any blood disorders you have. Any surgeries you have had. Any medical  conditions you have. Whether you are pregnant or may be pregnant. What are the risks? Generally, this is a safe procedure. However, problems may occur, including: Bleeding. Infection. Allergic reactions to medicines. Scarring that narrows the ureter (stricture). Creating a hole in the ureter (perforation). What happens before the procedure? Staying hydrated Follow instructions from your health care provider about hydration, which may include:  Medicines Ask your health care provider about: Changing or stopping your regular medicines. This is especially important if you are taking diabetes medicines or blood thinners. Taking medicines such as aspirin and ibuprofen. These medicines can thin your blood. Do not take these medicines unless your health care provider tells you to take them. Taking over-the-counter medicines, vitamins, herbs, and supplements. General instructions Do not use any products that contain nicotine or tobacco for at least 4 weeks before the procedure. These products include cigarettes, e-cigarettes, and chewing tobacco. If you need help quitting, ask your health care provider. You may have a urine sample taken to check for infection. Plan to have someone take you home from the hospital or clinic. If you will be going home right after the procedure, plan to have someone with you for 24 hours. Ask your health care provider what steps will be taken to help prevent infection. These may include: Washing skin with a germ-killing soap. Receiving antibiotic medicine. What happens during the procedure?  An IV will be inserted into one of  your veins. You will be given one or more of the following: A medicine to help you relax (sedative). A medicine to make you fall asleep (general anesthetic). A medicine that is injected into your spine to numb the area below and slightly above the injection site (spinal anesthetic). The part of your body that drains urine from your bladder (urethra) will be cleaned with a germ-killing solution. The ureteroscope will be passed through your urethra into your bladder. A salt-water solution will be sent through the ureteroscope to fill your bladder. This will help the health care provider see the openings of your ureters more clearly. The ureteroscope will be passed into your ureter. If a growth is found, a biopsy may be done. If a stone is found, it may be removed through the ureteroscope, or the stone may be broken up using a laser, shock waves, or electrical energy. In some cases, if the ureter is too small, a tube may be inserted that keeps the ureter open (ureteral stent). The stent may be left in place for 1 or 2 weeks to keep the ureter open, and then the ureteroscopy procedure will be done. The scope will be removed, and your bladder will be emptied. The procedure may vary among health care providers and hospitals. What can I expect after the procedure? After your procedure, it is common to have: Your blood pressure, heart rate, breathing rate, and blood oxygen level monitored until you leave the hospital or clinic. A burning sensation when you urinate. You may be asked to urinate. Blood in your urine. Mild discomfort in your bladder area or kidney area when urinating. A need to urinate more often or urgently. Follow these instructions at home: Medicines Take over-the-counter and prescription medicines only as told by your health care provider. If you were prescribed an antibiotic medicine, take it as told by your health care provider. Do not stop taking the antibiotic even if you start  to feel better. General instructions  If you were given a sedative during the procedure, it can affect you  for several hours. Do not drive or operate machinery until your health care provider says that it is safe. To relieve burning, take a warm bath or hold a warm washcloth over your groin. Drink enough fluid to keep your urine pale yellow. Drink two 8-ounce (237 mL) glasses of water every hour for the first 2 hours after you get home. Continue to drink water often at home. You can eat what you normally do. Keep all follow-up visits as told by your health care provider. This is important. If you had a ureteral stent placed, ask your health care provider when you need to return to have it removed. Contact a health care provider if you have: Chills or a fever. Burning pain for longer than 24 hours after the procedure. Blood in your urine for longer than 24 hours after the procedure. Get help right away if you have: Large amounts of blood in your urine. Blood clots in your urine. Severe pain. Chest pain or trouble breathing. The feeling of a full bladder and you are unable to urinate. These symptoms may represent a serious problem that is an emergency. Do not wait to see if the symptoms will go away. Get medical help right away. Call your local emergency services (911 in the [place] Summary Ureteroscopy is a procedure to check for and treat problems inside part of the urinary tract. In this procedure, a thin, flexible tube with a light at the end (ureteroscope) is used to look at the inside of the kidneys and the ureters. You may need this procedure if you have frequent urinary tract infections (UTIs), blood in your urine, or a stone in a ureter. This information is not intended to replace advice given to you by your health care provider. Make sure you discuss any questions you have with your health care provider. Document Revised: 03/02/2021 Document Reviewed: 03/26/2019 Elsevier Patient  Education  [place] Anesthesia, Adult, Care After This sheet gives you information about how to care for yourself after your procedure. Your health care provider may also give you more specific instructions. If you have problems or questions, contact your health care provider. What can I expect after the procedure? After the procedure, the following side effects are common: Pain or discomfort at the IV site. Nausea. Vomiting. Sore throat. Trouble concentrating. Feeling cold or chills. Feeling weak or tired. Sleepiness and fatigue. Soreness and body aches. These side effects can affect parts of the body that were not involved in surgery. Follow these instructions at home: For the time period you were told by your health care provider:  Rest. Do not participate in activities where you could fall or become injured. Do not drive or use machinery. Do not drink alcohol. Do not take sleeping pills or medicines that cause drowsiness. Do not make important decisions or sign legal documents. Do not take care of children on your own. Eating and drinking Follow any instructions from your health care provider about eating or drinking restrictions. When you feel hungry, start by eating small amounts of foods that are soft and easy to digest (bland), such as toast. Gradually return to your regular diet. Drink enough fluid to keep your urine pale yellow. If you vomit, rehydrate by drinking water, juice, or clear broth. General instructions If you have sleep apnea, surgery and certain medicines can increase your risk for breathing problems. Follow instructions from your health care provider about wearing your sleep device: Anytime you are sleeping, including during daytime naps.  While taking prescription pain medicines, sleeping medicines, or medicines that make you drowsy. Have a responsible adult stay with you for the time you are told. It is important to have someone help care  for you until you are awake and alert. Return to your normal activities as told by your health care provider. Ask your health care provider what activities are safe for you. Take over-the-counter and prescription medicines only as told by your health care provider. If you smoke, do not smoke without supervision. Keep all follow-up visits as told by your health care provider. This is important. Contact a health care provider if: You have nausea or vomiting that does not get better with medicine. You cannot eat or drink without vomiting. You have pain that does not get better with medicine. You are unable to pass urine. You develop a skin rash. You have a fever. You have redness around your IV site that gets worse. Get help right away if: You have difficulty breathing. You have chest pain. You have blood in your urine or stool, or you vomit blood. Summary After the procedure, it is common to have a sore throat or nausea. It is also common to feel tired. Have a responsible adult stay with you for the time you are told. It is important to have someone help care for you until you are awake and alert. When you feel hungry, start by eating small amounts of foods that are soft and easy to digest (bland), such as toast. Gradually return to your regular diet. Drink enough fluid to keep your urine pale yellow. Return to your normal activities as told by your health care provider. Ask your health care provider what activities are safe for you. This information is not intended to replace advice given to you by your health care provider. Make sure you discuss any questions you have with your health care provider. Document Revised: 03/04/2020 Document Reviewed: 10/02/2019 Elsevier Patient Education  2022 [employer].

## 2021-08-30 NOTE — Telephone Encounter (Signed)
Please advise 

## 2021-08-31 ENCOUNTER — Ambulatory Visit (HOSPITAL_COMMUNITY): Payer: BLUE CROSS/BLUE SHIELD | Admitting: Anesthesiology

## 2021-08-31 ENCOUNTER — Encounter (HOSPITAL_COMMUNITY)
Admission: RE | Disposition: A | Discharge status: Home / Self Care | Payer: Self-pay | Source: Home / Self Care | Attending: Urology

## 2021-08-31 ENCOUNTER — Ambulatory Visit (HOSPITAL_COMMUNITY)
Admission: RE | Admit: 2021-08-31 | Discharge: 2021-08-31 | Disposition: A | Discharge status: Home / Self Care | Payer: BLUE CROSS/BLUE SHIELD | Attending: Urology | Admitting: Urology

## 2021-08-31 ENCOUNTER — Encounter (HOSPITAL_COMMUNITY): Payer: Self-pay | Admitting: Urology

## 2021-08-31 ENCOUNTER — Ambulatory Visit (HOSPITAL_COMMUNITY): Payer: BLUE CROSS/BLUE SHIELD

## 2021-08-31 DIAGNOSIS — K219 Gastro-esophageal reflux disease without esophagitis: Secondary | ICD-10-CM | POA: Insufficient documentation

## 2021-08-31 DIAGNOSIS — N201 Calculus of ureter: Secondary | ICD-10-CM | POA: Insufficient documentation

## 2021-08-31 DIAGNOSIS — F319 Bipolar disorder, unspecified: Secondary | ICD-10-CM | POA: Insufficient documentation

## 2021-08-31 DIAGNOSIS — F419 Anxiety disorder, unspecified: Secondary | ICD-10-CM | POA: Insufficient documentation

## 2021-08-31 DIAGNOSIS — J45909 Unspecified asthma, uncomplicated: Secondary | ICD-10-CM | POA: Insufficient documentation

## 2021-08-31 HISTORY — PX: CYSTOSCOPY W/ URETERAL STENT PLACEMENT: SHX1429

## 2021-08-31 HISTORY — PX: URETEROSCOPY: SHX842

## 2021-08-31 SURGERY — CYSTOSCOPY, WITH RETROGRADE PYELOGRAM AND URETERAL STENT INSERTION
Anesthesia: General | Site: Ureter | Laterality: Right

## 2021-08-31 MED ORDER — CHLORHEXIDINE GLUCONATE 0.12 % MT SOLN
15.0000 mL | Freq: Once | OROMUCOSAL | Status: AC
  Administered 2021-08-31: 15 mL via OROMUCOSAL
  Filled 2021-08-31: qty 15

## 2021-08-31 MED ORDER — LIDOCAINE HCL URETHRAL/MUCOSAL 2 % EX GEL
CUTANEOUS | Status: AC
  Filled 2021-08-31: qty 10

## 2021-08-31 MED ORDER — ONDANSETRON HCL 4 MG/2ML IJ SOLN: 4.0000 mg | Freq: Once | INTRAMUSCULAR | Status: DC | PRN

## 2021-08-31 MED ORDER — STERILE WATER FOR IRRIGATION IR SOLN
Status: DC | PRN
  Administered 2021-08-31: 500 mL

## 2021-08-31 MED ORDER — DIATRIZOATE MEGLUMINE 30 % UR SOLN
URETHRAL | Status: AC
  Filled 2021-08-31: qty 100

## 2021-08-31 MED ORDER — SUGAMMADEX SODIUM 200 MG/2ML IV SOLN
INTRAVENOUS | Status: DC | PRN
  Administered 2021-08-31: 188.6 mg via INTRAVENOUS

## 2021-08-31 MED ORDER — FENTANYL CITRATE (PF) 100 MCG/2ML IJ SOLN
INTRAMUSCULAR | Status: AC
  Filled 2021-08-31: qty 2

## 2021-08-31 MED ORDER — ROCURONIUM BROMIDE 10 MG/ML (PF) SYRINGE
PREFILLED_SYRINGE | INTRAVENOUS | Status: DC | PRN
Start: 2021-08-31 — End: 2021-08-31
  Administered 2021-08-31: 50 mg via INTRAVENOUS

## 2021-08-31 MED ORDER — DEXAMETHASONE SODIUM PHOSPHATE 10 MG/ML IJ SOLN
INTRAMUSCULAR | Status: DC | PRN
  Administered 2021-08-31: 10 mg via INTRAVENOUS

## 2021-08-31 MED ORDER — ONDANSETRON HCL 4 MG/2ML IJ SOLN
INTRAMUSCULAR | Status: AC
  Filled 2021-08-31: qty 2

## 2021-08-31 MED ORDER — HYDROMORPHONE HCL 1 MG/ML IJ SOLN
INTRAMUSCULAR | Status: AC
  Filled 2021-08-31: qty 0.5

## 2021-08-31 MED ORDER — ONDANSETRON 4 MG PO TBDP: 4.0000 mg | ORAL_TABLET | Freq: Three times a day (TID) | ORAL | 0 refills | Status: DC | PRN

## 2021-08-31 MED ORDER — ROCURONIUM BROMIDE 10 MG/ML (PF) SYRINGE
PREFILLED_SYRINGE | INTRAVENOUS | Status: AC
  Filled 2021-08-31: qty 10

## 2021-08-31 MED ORDER — LIDOCAINE 2% (20 MG/ML) 5 ML SYRINGE
INTRAMUSCULAR | Status: DC | PRN
  Administered 2021-08-31: 80 mg via INTRAVENOUS

## 2021-08-31 MED ORDER — LIDOCAINE HCL URETHRAL/MUCOSAL 2 % EX GEL
CUTANEOUS | Status: DC | PRN
  Administered 2021-08-31: 1

## 2021-08-31 MED ORDER — PROPOFOL 10 MG/ML IV BOLUS
INTRAVENOUS | Status: DC | PRN
  Administered 2021-08-31: 150 mg via INTRAVENOUS

## 2021-08-31 MED ORDER — NITROFURANTOIN MONOHYD MACRO 100 MG PO CAPS: 100.0000 mg | ORAL_CAPSULE | Freq: Two times a day (BID) | ORAL | 0 refills | Status: DC

## 2021-08-31 MED ORDER — DEXMEDETOMIDINE (PRECEDEX) IN NS 20 MCG/5ML (4 MCG/ML) IV SYRINGE
PREFILLED_SYRINGE | INTRAVENOUS | Status: AC
  Filled 2021-08-31: qty 5

## 2021-08-31 MED ORDER — DEXAMETHASONE SODIUM PHOSPHATE 10 MG/ML IJ SOLN
INTRAMUSCULAR | Status: AC
  Filled 2021-08-31: qty 1

## 2021-08-31 MED ORDER — ONDANSETRON HCL 4 MG/2ML IJ SOLN
INTRAMUSCULAR | Status: DC | PRN
Start: 2021-08-31 — End: 2021-08-31
  Administered 2021-08-31: 4 mg via INTRAVENOUS

## 2021-08-31 MED ORDER — DIATRIZOATE MEGLUMINE 30 % UR SOLN
URETHRAL | Status: DC | PRN
  Administered 2021-08-31: 19 mL via URETHRAL

## 2021-08-31 MED ORDER — MIDAZOLAM HCL 2 MG/2ML IJ SOLN
INTRAMUSCULAR | Status: AC
  Filled 2021-08-31: qty 2

## 2021-08-31 MED ORDER — ONDANSETRON HCL 4 MG/2ML IJ SOLN
4.0000 mg | Freq: Once | INTRAMUSCULAR | Status: AC
  Administered 2021-08-31: 4 mg via INTRAVENOUS

## 2021-08-31 MED ORDER — MIDAZOLAM HCL 5 MG/5ML IJ SOLN
INTRAMUSCULAR | Status: DC | PRN
Start: 2021-08-31 — End: 2021-08-31
  Administered 2021-08-31: 2 mg via INTRAVENOUS

## 2021-08-31 MED ORDER — PHENAZOPYRIDINE HCL 200 MG PO TABS: 200.0000 mg | ORAL_TABLET | Freq: Three times a day (TID) | ORAL | 0 refills | Status: DC | PRN

## 2021-08-31 MED ORDER — SODIUM CHLORIDE 0.9 % IR SOLN
Status: DC | PRN
  Administered 2021-08-31 (×2): 3000 mL

## 2021-08-31 MED ORDER — LACTATED RINGERS IV SOLN
INTRAVENOUS | Status: DC
  Administered 2021-08-31: 1000 mL via INTRAVENOUS

## 2021-08-31 MED ORDER — ORAL CARE MOUTH RINSE: 15.0000 mL | Freq: Once | OROMUCOSAL | Status: AC

## 2021-08-31 MED ORDER — LIDOCAINE HCL (PF) 2 % IJ SOLN
INTRAMUSCULAR | Status: AC
  Filled 2021-08-31: qty 5

## 2021-08-31 MED ORDER — MEPERIDINE HCL 50 MG/ML IJ SOLN: 6.2500 mg | INTRAMUSCULAR | Status: DC | PRN

## 2021-08-31 MED ORDER — PROPOFOL 10 MG/ML IV BOLUS
INTRAVENOUS | Status: AC
  Filled 2021-08-31: qty 20

## 2021-08-31 MED ORDER — CIPROFLOXACIN IN D5W 400 MG/200ML IV SOLN
400.0000 mg | INTRAVENOUS | Status: AC
  Administered 2021-08-31: 400 mg via INTRAVENOUS
  Filled 2021-08-31: qty 200

## 2021-08-31 MED ORDER — FENTANYL CITRATE (PF) 250 MCG/5ML IJ SOLN
INTRAMUSCULAR | Status: DC | PRN
  Administered 2021-08-31 (×2): 50 ug via INTRAVENOUS

## 2021-08-31 MED ORDER — HYDROMORPHONE HCL 1 MG/ML IJ SOLN
0.2500 mg | INTRAMUSCULAR | Status: DC | PRN
  Administered 2021-08-31 (×3): 0.5 mg via INTRAVENOUS
  Filled 2021-08-31: qty 0.5

## 2021-08-31 SURGICAL SUPPLY — 19 items
BAG DRAIN URO TABLE W/ADPT NS (BAG) ×3 IMPLANT
BAG DRN 8 ADPR NS SKTRN CSTL (BAG) ×2
BAG HAMPER (MISCELLANEOUS) ×3 IMPLANT
CATH URET 5FR 28IN OPEN ENDED (CATHETERS) ×3 IMPLANT
CLOTH BEACON ORANGE TIMEOUT ST (SAFETY) ×3 IMPLANT
COVER MAYO STAND XLG (MISCELLANEOUS) ×3 IMPLANT
GLOVE SURG POLYISO LF SZ8 (GLOVE) ×3 IMPLANT
GLOVE SURG UNDER POLY LF SZ7 (GLOVE) ×6 IMPLANT
GOWN STRL REUS W/TWL LRG LVL3 (GOWN DISPOSABLE) ×3 IMPLANT
GOWN STRL REUS W/TWL XL LVL3 (GOWN DISPOSABLE) ×3 IMPLANT
GUIDEWIRE STR DUAL SENSOR (WIRE) ×5 IMPLANT
IV NS IRRIG 3000ML ARTHROMATIC (IV SOLUTION) ×6 IMPLANT
KIT TURNOVER CYSTO (KITS) ×3 IMPLANT
MANIFOLD NEPTUNE II (INSTRUMENTS) ×3 IMPLANT
PACK CYSTO (CUSTOM PROCEDURE TRAY) ×3 IMPLANT
PAD ARMBOARD 7.5X6 YLW CONV (MISCELLANEOUS) ×3 IMPLANT
SYR 10ML LL (SYRINGE) ×3 IMPLANT
TOWEL OR 17X26 4PK STRL BLUE (TOWEL DISPOSABLE) ×3 IMPLANT
WATER STERILE IRR 500ML POUR (IV SOLUTION) ×3 IMPLANT

## 2021-08-31 NOTE — Interval H&P Note (Signed)
History and Physical Interval Note: ? ?08/31/2021 ?9:08 AM ? ?She has not passed the right ureteral calculus and has had continued pain associated with the ureteral calculus.  Options for management discussed including continued attempts at passage, [facility], shock wave lithotripsy.  She wishes to proceed with right ureteroscopic stone manipulation with laser lithotripsy, retrograde pyelogram, and stent insertion. ? ?Risks, benefits, alternatives were discussed with the patient in detail.  Potential risks including, but not limited to, infection; bleeding;  injury to urethra, bladder, or ureter; possible need of other treatments; possible failure to remove the calculus; ureteral  stricture formation; cardiac, pulmonary, cerebrovascular events; and anesthetic complications were discussed.  The patient understands and wishes to proceed. ? ?Nicole Rodriguez  has presented today for surgery, with the diagnosis of Right Ureteral Stone.  The various methods of treatment have been discussed with the patient and family. After consideration of risks, benefits and other options for treatment, the patient has consented to  Procedure(s): ?CYSTOSCOPY WITH RETROGRADE PYELOGRAM/URETERAL STENT PLACEMENT (Right) ?HOLMIUM LASER APPLICATION (Right) as a surgical intervention.  The patient's history has been reviewed, patient examined, no change in status, stable for surgery.  I have reviewed the patient's chart and labs.  Questions were answered to the patient's satisfaction.   ? ? ?Leory Plowman Demontrez Rindfleisch ? ? ?

## 2021-08-31 NOTE — Transfer of Care (Signed)
Immediate Anesthesia Transfer of Care Note ? ?Patient: Nicole Rodriguez ? ?Procedure(s) Performed: CYSTOSCOPY WITH BILATERAL RETROGRADE PYELOGRAM (Right: Ureter) ?URETEROSCOPY (Right: Ureter) ? ?Patient Location: PACU ? ?Anesthesia Type:General ? ?Level of Consciousness: awake, alert  and oriented ? ?Airway & Oxygen Therapy: Patient Spontanous Breathing and Patient connected to nasal cannula oxygen ? ?Post-op Assessment: Report given to RN, Post -op Vital signs reviewed and stable, Patient moving all extremities X 4 and Patient able to stick tongue midline ? ?Post vital signs: Reviewed ? ?Last Vitals:  ?Vitals Value Taken Time  ?BP 121/57   ?Temp 98.0   ?Pulse 92   ?Resp 13   ?SpO2 98   ? ? ?Last Pain:  ?Vitals:  ? 08/31/21 0800  ?TempSrc: Oral  ?PainSc: 6   ?   ? ?Patients Stated Pain Goal: 7 (08/31/21 0800) ? ?Complications: No notable events documented. ?

## 2021-08-31 NOTE — Op Note (Signed)
OPERATIVE NOTE ? ? ?Patient Name: Nicole Rodriguez ? ?MRN: [id number] ? ? ?Date of Procedure: 08/31/21 ? ? ?Preoperative diagnosis:  ?Right ureteral calculus ? ?Postoperative diagnosis:  ?Spontaneous passage of right ureteral calculus ? ?Procedure:  ?Cystoscopy ?Bilateral retrograde pyelograms ?Right ureteroscopy ? ?Attending: Primus Bravo, MD ? ?Anesthesia: General ? ?Estimated blood loss: 0 mL ? ?Fluids: Per anesthesia record ? ?Drains: None ? ?Specimens: None ? ?Antibiotics: Cipro 400 mg IV ? ?Findings: Normal urethra and bladder; no filling defect or obstruction on bilateral retrograde pyelograms; no calculus visualized in right ureter ? ?Indications:  ?31 year old female with acute onset of right-sided flank pain on 08/22/2021 with associated frequency and urethral discomfort.  She was seen in the emergency room on 08/22/2021.  CT imaging showed a 2 x 3 mm calculus at the right UVJ with associated obstructive changes.  No other stones were identified.  She has attempted spontaneous passage of the stone but continued to have pain in the lower abdomen as well as frequency, urgency, and urethral discomfort.  She has been straining her urine and is not aware of passing a stone.  She has continued to require oral pain medication.  No fevers or chills.  No gross hematuria.  She presents now for cystoscopy, retrograde pyelograms, right ureteroscopic stone manipulation with possible laser lithotripsy and insertion of right ureteral stent.  Risk and benefits of the procedure discussed with the patient.  She understands and wishes to proceed as described. ? ?Description of Procedure:  ?The patient received IV Cipro preoperatively.  After successful induction of a general anesthetic, the patient was placed in the dorsolithotomy position.  The patient's genitalia was prepped and draped in sterile fashion.  Under direct visualization, a 21 French rigid cystoscope was passed through the urethra into the bladder.  No  stones were seen within the bladder.  A normal-appearing trigone was appreciated.  No mucosal abnormalities were seen throughout inspection of the entire bladder. ? ?Bilateral retrograde pyelograms were performed for evaluation of the patient's upper tracts given her history of ureteral calculi.  Scout film showed a nonspecific bowel gas pattern and no obvious bony abnormalities.  Using a 5 [place] open-ended catheter, contrast was injected into the right ureter.  No obvious filling defect or evidence of obstruction was seen.  In a similar fashion, contrast was injected to the left ureter.  No filling defect or evidence of obstruction was seen. ? ?I elected to proceed with right ureteroscopy to confirm passage of the stone.  A sensor guidewire was passed into the right ureter under fluoroscopic guidance.  The wire was placed to the right renal pelvis.  A 6 French rigid ureteroscope was passed into the right distal ureter along the guidewire.  No stone was seen within the distal ureter.  No significant ureteral edema was appreciated.  The ureteroscope was passed up to the proximal ureter and no stones were seen.  The ureteroscope was removed.  I did not feel that placement of a ureteral stent was indicated given the normal retrograde pyelogram and ureteroscopy.  The bladder was then drained.  The patient received intraurethral lidocaine jelly.  She was extubated and taken to the postanesthesia care unit in stable condition. ? ?Complications: None ? ?Condition: Stable, extubated, transferred to PACU ? ?Plan:  ?Discharge to home. ? ?

## 2021-08-31 NOTE — Anesthesia Preprocedure Evaluation (Addendum)
Anesthesia Evaluation  ?Patient identified by MRN, date of birth, ID band ?Patient awake ? ? ? ?Reviewed: ?Allergy & Precautions, NPO status , Patient's Chart, lab work & pertinent test results ? ?Airway ?Mallampati: II ? ?TM Distance: >3 FB ?Neck ROM: Full ? ? ? Dental ? ?(+) Dental Advisory Given, Teeth Intact ?  ?Pulmonary ?asthma ,  ?  ?Pulmonary exam normal ?breath sounds clear to auscultation ? ? ? ? ? ? Cardiovascular ?Exercise Tolerance: Good ?Normal cardiovascular exam ?Rhythm:Regular Rate:Normal ? ? ?  ?Neuro/Psych ?PSYCHIATRIC DISORDERS Anxiety Bipolar Disorder   ? GI/Hepatic ?Neg liver ROS, GERD  Medicated and Controlled,  ?Endo/Other  ?negative endocrine ROS ? Renal/GU ?negative Renal ROS  ? ?  ?Musculoskeletal ?negative musculoskeletal ROS ?(+)  ? Abdominal ?  ?Peds ? Hematology ?negative hematology ROS ?(+)   ?Anesthesia Other Findings ? ? Reproductive/Obstetrics ?negative OB ROS ? ?  ? ? ? ? ? ? ? ? ? ? ? ? ? ?  ?  ? ? ? ? ? ? ? ?Anesthesia Physical ?Anesthesia Plan ? ?ASA: 2 ? ?Anesthesia Plan: General  ? ?Post-op Pain Management: Dilaudid IV  ? ?Induction: Intravenous ? ?PONV Risk Score and Plan: 4 or greater and Ondansetron, Dexamethasone and Midazolam ? ?Airway Management Planned: Oral ETT ? ?Additional Equipment:  ? ?Intra-op Plan:  ? ?Post-operative Plan: Extubation in OR ? ?Informed Consent: I have reviewed the patients History and Physical, chart, labs and discussed the procedure including the risks, benefits and alternatives for the proposed anesthesia with the patient or authorized representative who has indicated his/her understanding and acceptance.  ? ? ? ?Dental advisory given ? ?Plan Discussed with: CRNA and Surgeon ? ?Anesthesia Plan Comments:   ? ? ? ? ? ?Anesthesia Quick Evaluation ? ?

## 2021-08-31 NOTE — Anesthesia Postprocedure Evaluation (Signed)
Anesthesia Post Note ? ?Patient: Shaunta Oncale ? ?Procedure(s) Performed: CYSTOSCOPY WITH BILATERAL RETROGRADE PYELOGRAM (Right: Ureter) ?URETEROSCOPY (Right: Ureter) ? ?Patient location during evaluation: Phase II ?Anesthesia Type: General ?Level of consciousness: awake and alert and oriented ?Pain management: pain level controlled ?Vital Signs Assessment: post-procedure vital signs reviewed and stable ?Respiratory status: spontaneous breathing, nonlabored ventilation and respiratory function stable ?Cardiovascular status: blood pressure returned to baseline and stable ?Postop Assessment: no apparent nausea or vomiting ?Anesthetic complications: no ? ? ?No notable events documented. ? ? ?Last Vitals:  ?Vitals:  ? 08/31/21 1045 08/31/21 1108  ?BP: 117/79 130/83  ?Pulse: (!) 102 99  ?Resp: 15 16  ?Temp:  36.6 ?C  ?SpO2: 94% 93%  ?  ?Last Pain:  ?Vitals:  ? 08/31/21 1108  ?TempSrc: Oral  ?PainSc: 4   ? ? ?  ?  ?  ?  ?  ?  ? ?Herlinda Heady C Alura Olveda ? ? ? ? ?

## 2021-08-31 NOTE — Anesthesia Procedure Notes (Signed)
Procedure Name: Intubation ?Date/Time: 08/31/2021 9:39 AM ?Performed by: Maude Leriche, CRNA ?Pre-anesthesia Checklist: Patient identified, Emergency Drugs available, Suction available and Patient being monitored ?Patient Re-evaluated:Patient Re-evaluated prior to induction ?Oxygen Delivery Method: Circle system utilized ?Preoxygenation: Pre-oxygenation with 100% oxygen ?Induction Type: IV induction ?Ventilation: Mask ventilation without difficulty ?Laryngoscope Size: Sabra Heck and 2 ?Grade View: Grade II ?Tube type: Oral ?Tube size: 7.0 mm ?Number of attempts: 1 ?Airway Equipment and Method: Stylet and Bite block ?Placement Confirmation: ETT inserted through vocal cords under direct vision, positive ETCO2 and breath sounds checked- equal and bilateral ?Secured at: 21 cm ?Tube secured with: Tape ?Dental Injury: Teeth and Oropharynx as per pre-operative assessment  ? ? ? ? ?

## 2021-09-05 ENCOUNTER — Encounter (HOSPITAL_COMMUNITY): Payer: Self-pay | Admitting: Urology

## 2021-09-06 NOTE — Patient Instructions (Addendum)
1. Moderate persistent asthma, uncomplicated ?- Daily controller medication(s):  AirDuo 113/46mg one puff twice daily  + Fasenra every month x 3 doses (then every 8 weeks thereafter) ?- Prior to physical activity: albuterol 2 puffs 10-15 minutes before physical activity. ?- Rescue medications: albuterol 4 puffs every 4-6 hours as needed ?- Asthma control goals:  ?* Full participation in all desired activities (may need albuterol before activity) ?* Albuterol use two time or less a week on average (not counting use with activity) ?* Cough interfering with sleep two time or less a month ?* Oral steroids no more than once a year ?* No hospitalizations ? ?2. Chronic rhinitis (grasses only) ?- Continue with: Xyzal (levocetirizine) '5mg'$  tablet once daily, Singulair (montelukast) '10mg'$  daily, and Flonase (fluticasone) one spray per nostril daily ?- Continue taking: Astelin (azelastine) 2 sprays per nostril 1-2 times daily as needed ?- You can use an extra dose of the antihistamine, if needed, for breakthrough symptoms.  ?- Consider nasal saline rinses 1-2 times daily to remove allergens from the nasal cavities as well as help with mucous clearance (this is especially helpful to do before the nasal sprays are given) ? ?3. Schedule a follow up appointment in 3-4 months or sooner ? ? ? ? ?

## 2021-09-07 ENCOUNTER — Ambulatory Visit (INDEPENDENT_AMBULATORY_CARE_PROVIDER_SITE_OTHER): Payer: BLUE CROSS/BLUE SHIELD | Admitting: Urology

## 2021-09-07 ENCOUNTER — Encounter: Payer: Self-pay | Admitting: Urology

## 2021-09-07 ENCOUNTER — Encounter: Payer: Self-pay | Admitting: Family

## 2021-09-07 ENCOUNTER — Other Ambulatory Visit: Payer: Self-pay | Admitting: Allergy & Immunology

## 2021-09-07 ENCOUNTER — Ambulatory Visit: Payer: BLUE CROSS/BLUE SHIELD

## 2021-09-07 ENCOUNTER — Other Ambulatory Visit: Payer: Self-pay

## 2021-09-07 ENCOUNTER — Ambulatory Visit (INDEPENDENT_AMBULATORY_CARE_PROVIDER_SITE_OTHER): Payer: BLUE CROSS/BLUE SHIELD | Admitting: Family

## 2021-09-07 VITALS — BP 126/86 | HR 90 | Temp 98.2°F | Resp 18 | Ht 62.0 in | Wt 208.2 lb

## 2021-09-07 VITALS — BP 138/83 | HR 116

## 2021-09-07 DIAGNOSIS — J302 Other seasonal allergic rhinitis: Secondary | ICD-10-CM

## 2021-09-07 DIAGNOSIS — J4540 Moderate persistent asthma, uncomplicated: Secondary | ICD-10-CM

## 2021-09-07 DIAGNOSIS — N200 Calculus of kidney: Secondary | ICD-10-CM | POA: Diagnosis not present

## 2021-09-07 MED ORDER — MONTELUKAST SODIUM 10 MG PO TABS: 10.0000 mg | ORAL_TABLET | Freq: Every day | ORAL | 1 refills | Status: DC

## 2021-09-07 MED ORDER — AZELASTINE HCL 137 MCG/SPRAY NA SOLN: 2.0000 | Freq: Two times a day (BID) | NASAL | 5 refills | Status: DC | PRN

## 2021-09-07 MED ORDER — LEVOCETIRIZINE DIHYDROCHLORIDE 5 MG PO TABS: 5.0000 mg | ORAL_TABLET | Freq: Every evening | ORAL | 5 refills | Status: DC

## 2021-09-07 MED ORDER — FLUTICASONE PROPIONATE 50 MCG/ACT NA SUSP: 1.0000 | Freq: Every day | NASAL | 5 refills | Status: DC

## 2021-09-07 NOTE — Progress Notes (Signed)
? ?Assessment: ?1. Nephrolithiasis   ? ? ?Plan: ?Stone prevention discussed. ?Return prn ? ?Chief Complaint:  ?Chief Complaint  ?Patient presents with  ? Nephrolithiasis  ? ? ?History of Present Illness: ? ?Nicole Rodriguez is a 31 y.o. year old female who is seen  for further evaluation of right ureteral calculus.  She had acute onset of right-sided flank pain yesterday.  This was preceded by approximately 24 hours of urinary frequency and urethral discomfort.  She had associated nausea without vomiting.  No fevers or chills.  She presented to the emergency room on 08/22/2021.  Urinalysis showed a pH of 6.0, 0-5 RBCs, 0-5 WBCs.  WBC 9.3.  Creatinine 0.78.  CT imaging showed a 2 x 3 mm calculus at the right UVJ with associated obstructive changes.  No other stones identified.  She was treated with pain medication and discharged.  She currently reports a pain of 7/10 in the right flank area.  She has not taken any pain medication since leaving the emergency room. ?She has a prior history of a kidney stone which she passed spontaneously approximately 5 years ago. ?She attempted spontaneous passage of the stone but continued to have pain in the lower abdomen as well as frequency, urgency, and urethral discomfort.  She was straining her urine and was not aware of passing a stone.  She continued to require oral pain medication.  No fevers or chills.  No gross hematuria.   ?She underwent cystoscopy, retrograde pyelograms, right ureteroscopy on 08/31/21.  No ureteral calculi seen.  No stent placed. ? ?She returns today for follow-up.  She is not having any further flank pain.  No lower urinary tract symptoms.  No dysuria or gross hematuria.  She is completing her antibiotics today. ? ?Portions of the above documentation were copied from a prior visit for review purposes only. ? ? ?Past Medical History:  ?Past Medical History:  ?Diagnosis Date  ? Asthma   ? Bipolar disorder ([place])   ? Generalized anxiety disorder   ? GERD  (gastroesophageal reflux disease)   ? Phrees[employer] 08/17/2020  ? IBS (irritable bowel syndrome)   ? Plantar fasciitis 10/15/2018  ? Rectal bleeding 09/24/2019  ? Social anxiety disorder   ? ? ?Past Surgical History:  ?Past Surgical History:  ?Procedure Laterality Date  ? APPENDECTOMY    ? BREAST LUMPECTOMY    ? COLONOSCOPY WITH PROPOFOL N/A 12/03/2019  ? Procedure: COLONOSCOPY WITH PROPOFOL;  Surgeon: Virgel Manifold, MD;  Location: [facility];  Service: Endoscopy;  Laterality: N/A;  ? CYSTOSCOPY W/ URETERAL STENT PLACEMENT Right 08/31/2021  ? Procedure: CYSTOSCOPY WITH BILATERAL RETROGRADE PYELOGRAM;  Surgeon: Primus Bravo., MD;  Location: [facility];  Service: Urology;  Laterality: Right;  ? ESOPHAGOGASTRODUODENOSCOPY (EGD) WITH PROPOFOL N/A 12/03/2019  ? Procedure: ESOPHAGOGASTRODUODENOSCOPY (EGD) WITH PROPOFOL;  Surgeon: Virgel Manifold, MD;  Location: [facility];  Service: Endoscopy;  Laterality: N/A;  ? FOOT SURGERY    ? URETEROSCOPY Right 08/31/2021  ? Procedure: URETEROSCOPY;  Surgeon: Primus Bravo., MD;  Location: [facility];  Service: Urology;  Laterality: Right;  ? ? ?Allergies:  ?Allergies  ?Allergen Reactions  ? Amoxicillin Hives  ? Asa [Aspirin] Hives  ? Cefoxitin Itching and Rash  ? Sulfa Antibiotics Hives  ? ? ?Family History:  ?Family History  ?Problem Relation Age of Onset  ? Allergic rhinitis Father   ? Asthma Father   ? Crohn's disease Sister   ?     half sister  ? Anxiety  disorder Maternal Grandmother   ? Depression Maternal Grandmother   ? Diverticulitis Paternal Grandmother   ? Diabetes Other   ? Heart failure Other   ? Colon cancer Neg Hx   ? Celiac disease Neg Hx   ? ? ?Social History:  ?Social History  ? ?Tobacco Use  ? Smoking status: Never  ?  Passive exposure: Never  ? Smokeless tobacco: Never  ?Vaping Use  ? Vaping Use: Never used  ?Substance Use Topics  ? Alcohol use: Yes  ?  Comment: socially   ? Drug use: No  ? ? ?ROS: ?Constitutional:  Negative for fever, chills,  weight loss ?CV: Negative for chest pain, previous MI, hypertension ?Respiratory:  Negative for shortness of breath, wheezing, sleep apnea, frequent cough ?GI:  Negative for nausea, vomiting, bloody stool, GERD ? ?Physical exam: ?BP 138/83   Pulse (!) 116   LMP 08/23/2021  ?GENERAL APPEARANCE:  Well appearing, well developed, well nourished, NAD ?HEENT:  Atraumatic, normocephalic, oropharynx clear ?NECK:  Supple without lymphadenopathy or thyromegaly ?ABDOMEN:  Soft, non-tender, no masses ?EXTREMITIES:  Moves all extremities well, without clubbing, cyanosis, or edema ?NEUROLOGIC:  Alert and oriented x 3, normal gait, CN II-XII grossly intact ?MENTAL STATUS:  appropriate ?BACK:  Non-tender to palpation, No CVAT ?SKIN:  Warm, dry, and intact ? ? ?Results: ?No sample provided ? ?

## 2021-09-07 NOTE — Progress Notes (Signed)
? ?[address], [place] [address] ?Dept: [phone] ? ?FOLLOW UP NOTE ? ?Patient ID: Nicole Rodriguez, female    DOB: January 30, 1991  Age: 31 y.o. MRN: [id number] ?Date of Office Visit: 09/07/2021 ? ?Assessment  ?Chief Complaint: Asthma (Fasenera shots has been helping. Plans to do her shot at home today 09/07/21.) ? ?HPI ?Nicole Rodriguez is a 31 year old female who presents today for follow-up of moderate persistent asthma and seasonal allergic rhinitis.  She was last seen on August 10, 2021 by Dr. Ernst Bowler.  Since her last office visit she had a cystoscopy with bilateral retrograde. ? ?Moderate persistent asthma is reported as doing much better since she started Fasenra injections along with her AirDuo 113/14 mcg 1 puff twice a day and albuterol as needed.  She reports shortness of breath only with physical activity that is cardio related.  She denies wheezing, coughing, tightness in chest, and nocturnal awakenings due to breathing problems.  Since her last office visit she has not required any systemic steroids or made any trips to the emergency room or urgent care due to breathing problems.  She is due for her next Fasenra injection today and will be doing these at home.  She denies any problems or reactions with her last Fasenra injection.  She is only using her albuterol inhaler with physical activity. ? ?Seasonal allergic rhinitis is reported as moderately controlled with Xyzal 5 mg at night, Singulair 10 mg once a day, Flonase 1 spray each nostril at night, and azelastine 2 sprays each nostril in the morning.  She reports occasional nasal congestion and denies rhinorrhea and postnasal drip.  She has not had any sinus infections since we last saw her. ? ? ?Drug Allergies:  ?Allergies  ?Allergen Reactions  ? Amoxicillin Hives  ? Asa [Aspirin] Hives  ? Cefoxitin Itching and Rash  ? Sulfa Antibiotics Hives  ? ? ?Review of Systems: ?Review of Systems  ?Constitutional:  Negative for chills and  fever.  ?HENT:    ?     Reports occasional nasal congestion and denies rhinorrhea and postnasal drip  ?Eyes:   ?     Denies itchy watery eyes  ?Respiratory:  Positive for shortness of breath. Negative for cough and wheezing.   ?     Reports shortness of breath with physical activity that is cardio related otherwise denies coughing, wheezing, tightness in chest, shortness of breath, and nocturnal awakenings due to breathing problems  ?Cardiovascular:  Negative for chest pain and palpitations.  ?Gastrointestinal:   ?     Denies heartburn or reflux symptoms  ?Genitourinary:  Negative for frequency.  ?Skin:  Negative for itching and rash.  ?Neurological:  Negative for headaches.  ?Endo/Heme/Allergies:  Positive for environmental allergies.  ? ? ?Physical Exam: ?BP 126/86   Pulse 90   Temp 98.2 ?F (36.8 ?C)   Resp 18   Ht '5\' 2"'$  (1.575 m)   Wt 208 lb 3.2 oz (94.4 kg)   LMP 08/23/2021   SpO2 98%   BMI 38.08 kg/m?   ? ?Physical Exam ?Constitutional:   ?   Appearance: Normal appearance.  ?HENT:  ?   Head: Normocephalic and atraumatic.  ?   Comments: Pharynx normal, eyes normal, ears normal, nose: Bilateral lower turbinates mildly edematous with no drainage noted ?   Right Ear: Tympanic membrane, ear canal and external ear normal.  ?   Left Ear: Tympanic membrane, ear canal and external ear normal.  ?   Mouth/Throat:  ?  Mouth: Mucous membranes are moist.  ?   Pharynx: Oropharynx is clear.  ?Eyes:  ?   Conjunctiva/sclera: Conjunctivae normal.  ?Cardiovascular:  ?   Rate and Rhythm: Normal rate and regular rhythm.  ?   Heart sounds: Normal heart sounds.  ?Pulmonary:  ?   Effort: Pulmonary effort is normal.  ?   Breath sounds: Normal breath sounds.  ?   Comments: Lungs clear to auscultation ?Musculoskeletal:  ?   Cervical back: Neck supple.  ?Skin: ?   General: Skin is warm.  ?Neurological:  ?   Mental Status: She is alert and oriented to person, place, and time.  ?Psychiatric:     ?   Mood and Affect: Mood normal.      ?   Behavior: Behavior normal.     ?   Thought Content: Thought content normal.     ?   Judgment: Judgment normal.  ? ? ?Diagnostics: ?FVC 2.97 L, FEV1 2.42 L (81%).  Predicted FVC 3.53 L, predicted FEV1 2.98 L.  Spirometry indicates normal respiratory function. ? ?Assessment and Plan: ?1. Moderate persistent asthma without complication   ?2. Other seasonal allergic rhinitis   ? ? ?Meds ordered this encounter  ?Medications  ? montelukast (SINGULAIR) 10 MG tablet  ?  Sig: Take 1 tablet (10 mg total) by mouth at bedtime.  ?  Dispense:  90 tablet  ?  Refill:  1  ? levocetirizine (XYZAL) 5 MG tablet  ?  Sig: Take 1 tablet (5 mg total) by mouth every evening.  ?  Dispense:  30 tablet  ?  Refill:  5  ? fluticasone (FLONASE) 50 MCG/ACT nasal spray  ?  Sig: Place 1 spray into both nostrils daily.  ?  Dispense:  16 g  ?  Refill:  5  ? Azelastine HCl 137 MCG/SPRAY SOLN  ?  Sig: Place 2 sprays into both nostrils 2 (two) times daily as needed. 2 SPRAYS PER NOSTRIL 1-2 TIMES DAILY AS NEEDED.  ?  Dispense:  30 mL  ?  Refill:  5  ? ? ?Patient Instructions  ?1. Moderate persistent asthma, uncomplicated ?- Daily controller medication(s):  AirDuo 113/68mg one puff twice daily  + Fasenra every month x 3 doses (then every 8 weeks thereafter) ?- Prior to physical activity: albuterol 2 puffs 10-15 minutes before physical activity. ?- Rescue medications: albuterol 4 puffs every 4-6 hours as needed ?- Asthma control goals:  ?* Full participation in all desired activities (may need albuterol before activity) ?* Albuterol use two time or less a week on average (not counting use with activity) ?* Cough interfering with sleep two time or less a month ?* Oral steroids no more than once a year ?* No hospitalizations ? ?2. Chronic rhinitis (grasses only) ?- Continue with: Xyzal (levocetirizine) '5mg'$  tablet once daily, Singulair (montelukast) '10mg'$  daily, and Flonase (fluticasone) one spray per nostril daily ?- Continue taking: Astelin  (azelastine) 2 sprays per nostril 1-2 times daily as needed ?- You can use an extra dose of the antihistamine, if needed, for breakthrough symptoms.  ?- Consider nasal saline rinses 1-2 times daily to remove allergens from the nasal cavities as well as help with mucous clearance (this is especially helpful to do before the nasal sprays are given) ? ?3. Schedule a follow up appointment in 3-4 months or sooner ? ? ? ? ? ?Return in about 3 months (around 12/08/2021), or if symptoms worsen or fail to improve. ?  ? ?Thank you for  the opportunity to care for this patient.  Please do not hesitate to contact me with questions. ? ?Althea Charon, FNP ?[facility] ? ? ? ? ?

## 2021-09-13 ENCOUNTER — Telehealth: Payer: Self-pay

## 2021-09-13 NOTE — Telephone Encounter (Signed)
FMLA paperwork completed per pt request and left at front desk per patient request.  ?

## 2021-10-05 ENCOUNTER — Ambulatory Visit (HOSPITAL_BASED_OUTPATIENT_CLINIC_OR_DEPARTMENT_OTHER): Payer: BLUE CROSS/BLUE SHIELD | Admitting: Psychiatry

## 2021-10-05 DIAGNOSIS — F325 Major depressive disorder, single episode, in full remission: Secondary | ICD-10-CM | POA: Diagnosis not present

## 2021-10-05 MED ORDER — CLONAZEPAM 1 MG PO TABS
ORAL_TABLET | ORAL | 4 refills | Status: DC
Start: 2021-10-05 — End: 2022-01-31

## 2021-10-05 NOTE — Progress Notes (Signed)
BH MD/PA/NP OP Progress Note ? ?10/05/2021 2:56 PM ?Nicole Rodriguez  ?MRN:  [id number] ?Interview was conducted by phone and I verified that I was speaking with the correct person using two identifiers. I discussed the limitations of evaluation and management by telemedicine and  the availability of in person appointments. Patient expressed understanding and agreed to proceed. Participants in the visit: patient (location - home); physician (location - home office). ? ?Chief Complaint: Panic attacks. ? Rob Hickman.  ?Visit Diagnosis:  ? ?Today this patient is doing very very well.  Her partner [name] has ended this relationship and she knew it was coming.  A week ago she made a more facial and is asking for divorce.  The patient seems to be resilient.  She seems to know this was coming.  The patient has done well off off of all her psychiatric medicines other than Klonopin 1 mg.  She is sleeping and eating well.  She has a great job working at [employer] that she loves and is doing very well there.  She is very stable.  She is making physical changes.  She has lost weight and she is gotten ? ?She seems much better.  The patient has never been in a psychiatric hospital.  The patient describes a clear episode in 2019 and major depression.  In a close evaluation shows no evidence of ever having mania.  Therefore I think her diagnosis of bipolar disorder is incorrect.  The best diagnosis for this patient is that of major depression single episode.  She obviously had some irritability during her episode of depression but it was unrelated to grandiosity, racing thinking or destructive behavior.  Patient's health is very good.  She is got her asthma under control. ?Virtual Visit via Telephone Note ? ?I connected with Nicole Rodriguez on 10/05/21 at  2:30 PM EDT by telephone and verified that I am speaking with the correct person using two identifiers. ? ?Location: ?Patient: home ?Provider: office ?  ?I discussed the limitations,  risks, security and privacy concerns of performing an evaluation and management service by telephone and the availability of in person appointments. I also discussed with the patient that there may be a patient responsible charge related to this service. The patient expressed understanding and agreed to proceed. ? ? ? ?  ?I discussed the assessment and treatment plan with the patient. The patient was provided an opportunity to ask questions and all were answered. The patient agreed with the plan and demonstrated an understanding of the instructions. ?  ?The patient was advised to call back or seek an in-person evaluation if the symptoms worsen or if the condition fails to improve as anticipated. ? ?I provided 30 minutes of non-face-to-face time during this encounter. ? ? ?Jerral Ralph, MD  ?No diagnosis found. ? ?Past Psychiatric History: Please see intake H&P. ? ?Past Medical History:  ?Past Medical History:  ?Diagnosis Date  ? Asthma   ? Bipolar disorder ([place])   ? Generalized anxiety disorder   ? GERD (gastroesophageal reflux disease)   ? Phreesia 08/17/2020  ? IBS (irritable bowel syndrome)   ? Plantar fasciitis 10/15/2018  ? Rectal bleeding 09/24/2019  ? Social anxiety disorder   ?  ?Past Surgical History:  ?Procedure Laterality Date  ? APPENDECTOMY    ? BREAST LUMPECTOMY    ? COLONOSCOPY WITH PROPOFOL N/A 12/03/2019  ? Procedure: COLONOSCOPY WITH PROPOFOL;  Surgeon: Virgel Manifold, MD;  Location: [facility];  Service: Endoscopy;  Laterality: N/A;  ?  CYSTOSCOPY W/ URETERAL STENT PLACEMENT Right 08/31/2021  ? Procedure: CYSTOSCOPY WITH BILATERAL RETROGRADE PYELOGRAM;  Surgeon: Primus Bravo., MD;  Location: [facility];  Service: Urology;  Laterality: Right;  ? ESOPHAGOGASTRODUODENOSCOPY (EGD) WITH PROPOFOL N/A 12/03/2019  ? Procedure: ESOPHAGOGASTRODUODENOSCOPY (EGD) WITH PROPOFOL;  Surgeon: Virgel Manifold, MD;  Location: [facility];  Service: Endoscopy;  Laterality: N/A;  ? FOOT SURGERY    ?  URETEROSCOPY Right 08/31/2021  ? Procedure: URETEROSCOPY;  Surgeon: Primus Bravo., MD;  Location: [facility];  Service: Urology;  Laterality: Right;  ? ? ?Family Psychiatric History: Reviewed. ? ?Family History:  ?Family History  ?Problem Relation Age of Onset  ? Allergic rhinitis Father   ? Asthma Father   ? Crohn's disease Sister   ?     half sister  ? Anxiety disorder Maternal Grandmother   ? Depression Maternal Grandmother   ? Diverticulitis Paternal Grandmother   ? Diabetes Other   ? Heart failure Other   ? Colon cancer Neg Hx   ? Celiac disease Neg Hx   ? ? ?Social History:  ?Social History  ? ?Socioeconomic History  ? Marital status: Married  ?  Spouse name: Not on file  ? Number of children: 1  ? Years of education: Not on file  ? Highest education level: Not on file  ?Occupational History  ? Not on file  ?Tobacco Use  ? Smoking status: Never  ?  Passive exposure: Never  ? Smokeless tobacco: Never  ?Vaping Use  ? Vaping Use: Never used  ?Substance and Sexual Activity  ? Alcohol use: Yes  ?  Comment: socially   ? Drug use: No  ? Sexual activity: Yes  ?  Birth control/protection: None  ?Other Topics Concern  ? Not on file  ?Social History Narrative  ? Daughter 70 years old.  ? ?Social Determinants of Health  ? ?Financial Resource Strain: Not on file  ?Food Insecurity: Not on file  ?Transportation Needs: Not on file  ?Physical Activity: Not on file  ?Stress: Not on file  ?Social Connections: Not on file  ? ? ?Allergies:  ?Allergies  ?Allergen Reactions  ? Amoxicillin Hives  ? Asa [Aspirin] Hives  ? Cefoxitin Itching and Rash  ? Sulfa Antibiotics Hives  ? ? ?Metabolic Disorder Labs: ?Lab Results  ?Component Value Date  ? HGBA1C 5.2 02/01/2017  ? ?No results found for: PROLACTIN ?Lab Results  ?Component Value Date  ? CHOL 159 02/01/2017  ? TRIG 85 02/01/2017  ? HDL 44 02/01/2017  ? CHOLHDL 3.6 02/01/2017  ? Kinder 98 02/01/2017  ? LDLCALC 112 (H) 09/14/2016  ? ?Lab Results  ?Component Value Date  ? TSH 1.500  09/14/2016  ? ? ?Therapeutic Level Labs: ?No results found for: LITHIUM ?No results found for: VALPROATE ?No components found for:  CBMZ ? ?Current Medications: ?Current Outpatient Medications  ?Medication Sig Dispense Refill  ? AIRDUO DIGIHALER 113-14 MCG/ACT AEPB Inhale 1 puff into the lungs 2 (two) times daily. 1 each 5  ? albuterol (PROVENTIL) (2.5 MG/3ML) 0.083% nebulizer solution Take 3 mLs (2.5 mg total) by nebulization every 6 (six) hours as needed for wheezing or shortness of breath. 75 mL 3  ? albuterol (VENTOLIN HFA) 108 (90 Base) MCG/ACT inhaler Inhale 2 puffs into the lungs every 6 (six) hours as needed for wheezing or shortness of breath. 8 g 5  ? Azelastine HCl 137 MCG/SPRAY SOLN Place 2 sprays into both nostrils 2 (two) times daily as needed. 2 SPRAYS  PER NOSTRIL 1-2 TIMES DAILY AS NEEDED. 30 mL 5  ? buPROPion (WELLBUTRIN SR) 200 MG 12 hr tablet Take 1 tablet (200 mg total) by mouth 2 (two) times daily. Please take the second dose not later than 4 PM. 60 tablet 2  ? clonazePAM (KLONOPIN) 1 MG tablet 1  qhs 30 tablet 4  ? FASENRA PEN 30 MG/ML SOAJ Inject 30 mg into the skin every 28 (twenty-eight) days. Benralizumab. For severe eosinophilic asthma    ? fluticasone (FLONASE) 50 MCG/ACT nasal spray Place 1 spray into both nostrils daily. 16 g 5  ? levocetirizine (XYZAL) 5 MG tablet Take 1 tablet (5 mg total) by mouth every evening. 30 tablet 5  ? montelukast (SINGULAIR) 10 MG tablet Take 1 tablet (10 mg total) by mouth at bedtime. 90 tablet 1  ? ondansetron (ZOFRAN-ODT) 4 MG disintegrating tablet Take 1 tablet (4 mg total) by mouth every 8 (eight) hours as needed for nausea or vomiting. 10 tablet 0  ? pantoprazole (PROTONIX) 40 MG tablet Take 1 tablet (40 mg total) by mouth 2 (two) times daily before a meal. 60 tablet 5  ? phenazopyridine (PYRIDIUM) 200 MG tablet Take 1 tablet (200 mg total) by mouth 3 (three) times daily as needed for pain. 20 tablet 0  ? traZODone (DESYREL) 50 MG tablet Take 1 tablet  by mouth as needed.    ? ?No current facility-administered medications for this visit.  ? ? ? ? ?Psychiatric Specialty Exam: ?Review of Systems  ?Psychiatric/Behavioral:  The patient is nervous/anxious

## 2021-10-06 ENCOUNTER — Encounter (HOSPITAL_COMMUNITY): Payer: Self-pay

## 2021-10-10 ENCOUNTER — Ambulatory Visit
Admission: RE | Admit: 2021-10-10 | Discharge: 2021-10-10 | Disposition: A | Discharge status: Home / Self Care | Payer: BLUE CROSS/BLUE SHIELD | Source: Ambulatory Visit | Attending: Urgent Care | Admitting: Urgent Care

## 2021-10-10 VITALS — BP 115/79 | HR 94 | Temp 98.3°F | Resp 18

## 2021-10-10 DIAGNOSIS — R070 Pain in throat: Secondary | ICD-10-CM | POA: Insufficient documentation

## 2021-10-10 DIAGNOSIS — J309 Allergic rhinitis, unspecified: Secondary | ICD-10-CM | POA: Diagnosis present

## 2021-10-10 DIAGNOSIS — R1110 Vomiting, unspecified: Secondary | ICD-10-CM | POA: Insufficient documentation

## 2021-10-10 DIAGNOSIS — R052 Subacute cough: Secondary | ICD-10-CM | POA: Diagnosis present

## 2021-10-10 DIAGNOSIS — J4540 Moderate persistent asthma, uncomplicated: Secondary | ICD-10-CM | POA: Diagnosis present

## 2021-10-10 DIAGNOSIS — J069 Acute upper respiratory infection, unspecified: Secondary | ICD-10-CM | POA: Diagnosis present

## 2021-10-10 DIAGNOSIS — R0982 Postnasal drip: Secondary | ICD-10-CM | POA: Diagnosis present

## 2021-10-10 LAB — POCT RAPID STREP A (OFFICE): Rapid Strep A Screen: NEGATIVE

## 2021-10-10 MED ORDER — PREDNISONE 50 MG PO TABS: 50.0000 mg | ORAL_TABLET | Freq: Every day | ORAL | 0 refills | Status: DC

## 2021-10-10 MED ORDER — PROMETHAZINE-DM 6.25-15 MG/5ML PO SYRP: 5.0000 mL | ORAL_SOLUTION | Freq: Three times a day (TID) | ORAL | 0 refills | Status: DC | PRN

## 2021-10-10 NOTE — ED Triage Notes (Signed)
Pt reports sore throat x 4 days; cough x 2 days.  ?

## 2021-10-10 NOTE — ED Provider Notes (Signed)
?[place] ? ? ?MRN: [id number] DOB: 01-15-1991 ? ?Subjective:  ? ?Nicole Rodriguez is a 31 y.o. female presenting for 4 day history of coughing, throat pain. Symptoms started after she spent a lot of time outdoors.  Has a history of severe allergic rhinitis and asthma.  Wanted to make sure she did not have strep. ? ?No current facility-administered medications for this encounter. ? ?Current Outpatient Medications:  ?  AIRDUO DIGIHALER 113-14 MCG/ACT AEPB, Inhale 1 puff into the lungs 2 (two) times daily., Disp: 1 each, Rfl: 5 ?  albuterol (PROVENTIL) (2.5 MG/3ML) 0.083% nebulizer solution, Take 3 mLs (2.5 mg total) by nebulization every 6 (six) hours as needed for wheezing or shortness of breath., Disp: 75 mL, Rfl: 3 ?  albuterol (VENTOLIN HFA) 108 (90 Base) MCG/ACT inhaler, Inhale 2 puffs into the lungs every 6 (six) hours as needed for wheezing or shortness of breath., Disp: 8 g, Rfl: 5 ?  Azelastine HCl 137 MCG/SPRAY SOLN, Place 2 sprays into both nostrils 2 (two) times daily as needed. 2 SPRAYS PER NOSTRIL 1-2 TIMES DAILY AS NEEDED., Disp: 30 mL, Rfl: 5 ?  buPROPion (WELLBUTRIN SR) 200 MG 12 hr tablet, Take 1 tablet (200 mg total) by mouth 2 (two) times daily. Please take the second dose not later than 4 PM., Disp: 60 tablet, Rfl: 2 ?  clonazePAM (KLONOPIN) 1 MG tablet, 1  qhs, Disp: 30 tablet, Rfl: 4 ?  FASENRA PEN 30 MG/ML SOAJ, Inject 30 mg into the skin every 28 (twenty-eight) days. Benralizumab. For severe eosinophilic asthma, Disp: , Rfl:  ?  fluticasone (FLONASE) 50 MCG/ACT nasal spray, Place 1 spray into both nostrils daily., Disp: 16 g, Rfl: 5 ?  levocetirizine (XYZAL) 5 MG tablet, Take 1 tablet (5 mg total) by mouth every evening., Disp: 30 tablet, Rfl: 5 ?  montelukast (SINGULAIR) 10 MG tablet, Take 1 tablet (10 mg total) by mouth at bedtime., Disp: 90 tablet, Rfl: 1 ?  ondansetron (ZOFRAN-ODT) 4 MG disintegrating tablet, Take 1 tablet (4 mg total) by mouth every 8 (eight) hours as  needed for nausea or vomiting., Disp: 10 tablet, Rfl: 0 ?  pantoprazole (PROTONIX) 40 MG tablet, Take 1 tablet (40 mg total) by mouth 2 (two) times daily before a meal., Disp: 60 tablet, Rfl: 5 ?  phenazopyridine (PYRIDIUM) 200 MG tablet, Take 1 tablet (200 mg total) by mouth 3 (three) times daily as needed for pain., Disp: 20 tablet, Rfl: 0 ?  traZODone (DESYREL) 50 MG tablet, Take 1 tablet by mouth as needed., Disp: , Rfl:   ? ?Allergies  ?Allergen Reactions  ? Amoxicillin Hives  ? Asa [Aspirin] Hives  ? Cefoxitin Itching and Rash  ? Sulfa Antibiotics Hives  ? ? ?Past Medical History:  ?Diagnosis Date  ? Asthma   ? Bipolar disorder ([place])   ? Generalized anxiety disorder   ? GERD (gastroesophageal reflux disease)   ? Phreesia 08/17/2020  ? IBS (irritable bowel syndrome)   ? Plantar fasciitis 10/15/2018  ? Rectal bleeding 09/24/2019  ? Social anxiety disorder   ?  ? ?Past Surgical History:  ?Procedure Laterality Date  ? APPENDECTOMY    ? BREAST LUMPECTOMY    ? COLONOSCOPY WITH PROPOFOL N/A 12/03/2019  ? Procedure: COLONOSCOPY WITH PROPOFOL;  Surgeon: Virgel Manifold, MD;  Location: [facility];  Service: Endoscopy;  Laterality: N/A;  ? CYSTOSCOPY W/ URETERAL STENT PLACEMENT Right 08/31/2021  ? Procedure: CYSTOSCOPY WITH BILATERAL RETROGRADE PYELOGRAM;  Surgeon: Primus Bravo., MD;  Location: [facility];  Service: Urology;  Laterality: Right;  ? ESOPHAGOGASTRODUODENOSCOPY (EGD) WITH PROPOFOL N/A 12/03/2019  ? Procedure: ESOPHAGOGASTRODUODENOSCOPY (EGD) WITH PROPOFOL;  Surgeon: Virgel Manifold, MD;  Location: [facility];  Service: Endoscopy;  Laterality: N/A;  ? FOOT SURGERY    ? URETEROSCOPY Right 08/31/2021  ? Procedure: URETEROSCOPY;  Surgeon: Primus Bravo., MD;  Location: [facility];  Service: Urology;  Laterality: Right;  ? ? ?Family History  ?Problem Relation Age of Onset  ? Allergic rhinitis Father   ? Asthma Father   ? Crohn's disease Sister   ?     half sister  ? Anxiety disorder Maternal  Grandmother   ? Depression Maternal Grandmother   ? Diverticulitis Paternal Grandmother   ? Diabetes Other   ? Heart failure Other   ? Colon cancer Neg Hx   ? Celiac disease Neg Hx   ? ? ?Social History  ? ?Tobacco Use  ? Smoking status: Never  ?  Passive exposure: Never  ? Smokeless tobacco: Never  ?Vaping Use  ? Vaping Use: Never used  ?Substance Use Topics  ? Alcohol use: Yes  ?  Comment: socially   ? Drug use: No  ? ? ?ROS ? ? ?Objective:  ? ?Vitals: ?BP 115/79 (BP Location: Right Arm)   Pulse 94   Temp 98.3 ?F (36.8 ?C) (Oral)   Resp 18   LMP 09/26/2021 (Exact Date)   SpO2 95%  ? ?Physical Exam ?Constitutional:   ?   General: She is not in acute distress. ?   Appearance: Normal appearance. She is well-developed. She is not ill-appearing, toxic-appearing or diaphoretic.  ?HENT:  ?   Head: Normocephalic and atraumatic.  ?   Nose: Nose normal.  ?   Mouth/Throat:  ?   Mouth: Mucous membranes are moist.  ?   Pharynx: No pharyngeal swelling, oropharyngeal exudate, posterior oropharyngeal erythema or uvula swelling.  ?   Tonsils: No tonsillar exudate or tonsillar abscesses. 0 on the right. 0 on the left.  ?   Comments: Significant postnasal drainage overlying pharynx. ?Eyes:  ?   General: No scleral icterus.    ?   Right eye: No discharge.     ?   Left eye: No discharge.  ?   Extraocular Movements: Extraocular movements intact.  ?Cardiovascular:  ?   Rate and Rhythm: Normal rate.  ?   Heart sounds: No murmur heard. ?  No friction rub. No gallop.  ?Pulmonary:  ?   Effort: Pulmonary effort is normal. No respiratory distress.  ?   Breath sounds: No stridor. No wheezing, rhonchi or rales.  ?Chest:  ?   Chest wall: No tenderness.  ?Skin: ?   General: Skin is warm and dry.  ?Neurological:  ?   General: No focal deficit present.  ?   Mental Status: She is alert and oriented to person, place, and time.  ?Psychiatric:     ?   Mood and Affect: Mood normal.     ?   Behavior: Behavior normal.  ? ? ?Results for orders placed  or performed during the hospital encounter of 10/10/21 (from the past 24 hour(s))  ?POCT rapid strep A     Status: None  ? Collection Time: 10/10/21  4:11 PM  ?Result Value Ref Range  ? Rapid Strep A Screen Negative Negative  ? ? ?Assessment and Plan :  ? ?PDMP not reviewed this encounter. ? ?1. Viral upper respiratory illness   ?2. Throat pain   ?  3. Post-nasal drainage   ?4. Subacute cough   ?5. Post-tussive emesis   ?6. Moderate persistent asthma without complication   ?7. Allergic rhinitis, unspecified seasonality, unspecified trigger   ? ?In the context of her allergic rhinitis and asthma offered an oral steroid course.  Patient would like to do this as well.  Strep culture pending.  Given timeline of illness, physical exam findings, deferred COVID testing. Deferred imaging given clear cardiopulmonary exam, hemodynamically stable vital signs.  Maintain all allergy and asthma medications/treatments.  Counseled patient on potential for adverse effects with medications prescribed/recommended today, ER and return-to-clinic precautions discussed, patient verbalized understanding. ? ?  ?Jaynee Eagles, PA-C ?10/10/21 1632 ? ?

## 2021-10-11 ENCOUNTER — Telehealth (HOSPITAL_COMMUNITY): Payer: Self-pay

## 2021-10-11 NOTE — Telephone Encounter (Signed)
Writer tried to fax pt's Programmer, systems had done on her behalf, per Dr. Casimiro Needle, to pt's lawyer w/ fax # provided by the pt several times but the fax failed each time. Writer also tried to reach the pt several times to verify the fax # but with no success. Writer sent copy of letter to pt's home address ?

## 2021-10-12 ENCOUNTER — Encounter: Payer: Self-pay | Admitting: Allergy & Immunology

## 2021-10-13 LAB — CULTURE, GROUP A STREP (THRC)

## 2021-11-04 ENCOUNTER — Encounter: Payer: Self-pay | Admitting: Allergy & Immunology

## 2021-11-04 MED ORDER — AIRDUO DIGIHALER 113-14 MCG/ACT IN AEPB: 1.0000 | INHALATION_SPRAY | Freq: Two times a day (BID) | RESPIRATORY_TRACT | 5 refills | Status: DC

## 2021-12-06 NOTE — Patient Instructions (Incomplete)
1. Moderate persistent asthma, uncomplicated - Daily controller medication(s):  AirDuo 113/55mg one puff twice daily  + Fasenra every month x 3 doses (then every 8 weeks thereafter) - Prior to physical activity: albuterol 2 puffs 10-15 minutes before physical activity. - Rescue medications: albuterol 4 puffs every 4-6 hours as needed - Asthma control goals:  * Full participation in all desired activities (may need albuterol before activity) * Albuterol use two time or less a week on average (not counting use with activity) * Cough interfering with sleep two time or less a month * Oral steroids no more than once a year * No hospitalizations  2. Chronic rhinitis (grasses only) - Continue with: Xyzal (levocetirizine) '5mg'$  tablet once daily, Singulair (montelukast) '10mg'$  daily, and Flonase (fluticasone) one spray per nostril daily - Continue taking: Astelin (azelastine) 2 sprays per nostril 1-2 times daily as needed - You can use an extra dose of the antihistamine, if needed, for breakthrough symptoms.  - Consider nasal saline rinses 1-2 times daily to remove allergens from the nasal cavities as well as help with mucous clearance (this is especially helpful to do before the nasal sprays are given)  3. Schedule a follow up appointment in 4 months or sooner

## 2021-12-07 ENCOUNTER — Ambulatory Visit (INDEPENDENT_AMBULATORY_CARE_PROVIDER_SITE_OTHER): Payer: BLUE CROSS/BLUE SHIELD | Admitting: Family

## 2021-12-07 ENCOUNTER — Encounter: Payer: Self-pay | Admitting: Family

## 2021-12-07 VITALS — BP 112/68 | HR 97 | Temp 98.1°F | Resp 20 | Ht 62.0 in | Wt 187.4 lb

## 2021-12-07 DIAGNOSIS — J4540 Moderate persistent asthma, uncomplicated: Secondary | ICD-10-CM | POA: Diagnosis not present

## 2021-12-07 DIAGNOSIS — J302 Other seasonal allergic rhinitis: Secondary | ICD-10-CM

## 2021-12-07 MED ORDER — MONTELUKAST SODIUM 10 MG PO TABS: 10.0000 mg | ORAL_TABLET | Freq: Every day | ORAL | 1 refills | Status: DC

## 2021-12-07 MED ORDER — LEVOCETIRIZINE DIHYDROCHLORIDE 5 MG PO TABS
5.0000 mg | ORAL_TABLET | Freq: Every evening | ORAL | 3 refills | Status: DC
Start: 2021-12-07 — End: 2022-05-09

## 2021-12-07 MED ORDER — AZELASTINE HCL 137 MCG/SPRAY NA SOLN: 2.0000 | Freq: Two times a day (BID) | NASAL | 3 refills | Status: DC | PRN

## 2021-12-07 MED ORDER — AIRDUO DIGIHALER 113-14 MCG/ACT IN AEPB: 1.0000 | INHALATION_SPRAY | Freq: Two times a day (BID) | RESPIRATORY_TRACT | 5 refills | Status: DC

## 2021-12-07 MED ORDER — FLUTICASONE PROPIONATE 50 MCG/ACT NA SUSP: 1.0000 | Freq: Every day | NASAL | 3 refills | Status: DC

## 2021-12-07 NOTE — Progress Notes (Signed)
[place], [address] [place] [address] Dept: [phone]  FOLLOW UP NOTE  Patient ID: Nicole Rodriguez, female    DOB: 01-08-1991  Age: 31 y.o. MRN: [id number] Date of Office Visit: 12/07/2021  Assessment  Chief Complaint: Asthma (Has been well until today. This morning woke up coughing, little wheezing, used rescue inhaler(3 puffs), chest tightness. ) and Allergic Rhinitis  (Has been well until today. Runny/stuffy nose. )  HPI Nicole Rodriguez is a 31 year old female who presents today for follow-up of moderate persistent asthma and seasonal allergic rhinitis.  She was last seen on September 07, 2021 by myself.  Since her last office visit she denies any new diagnosis or surgery since her last office visit.  Moderate persistent asthma: She reports that this morning she has had some cough that is mostly dry but at times wet, but not able to get anything up, wheezing this morning, and some tightness in her chest.  The symptoms have all gotten better throughout the day after using her air duo 113 mcg 1 puff twice a day and her albuterol inhaler 1 puff 3 times spaced throughout the day.  Her symptoms started after being exposed to dust at work yesterday.  She denies tightness in her chest, fever and chills.  Since her last office visit she did receive 1 round of steroids at the urgent care due to a viral upper respiratory infection.  She reports very limited use of her albuterol with physical activity.  She reports that she can tell a difference with her breathing since starting [place].  It really works.  Her last injection was last Wednesday and she is now getting her Berna Bue injections every 8 weeks.  She has noticed that when she takes her Berna Bue injections it does make her sleepy that first night, and in the morning after she will have a headache and sore throat.  She will take a Tylenol to mitigate the symptoms and she is fine.  She reports that she knows that these are common side  effects.  Seasonal allergic rhinitis is reported as moderately controlled with Xyzal 5 mg once a day, Singulair 10 mg once a day, Flonase 1 spray each nostril once a day, and azelastine nasal spray every other day unless she has an increase in her symptoms.  She reports that today she has had some nasal congestion, clear rhinorrhea, and a little bit of postnasal drip.  Otherwise her symptoms are off-and-on and occur when around freshly mowed grass.  Since her last office visit she has not had any sinus infections.   Drug Allergies:  Allergies  Allergen Reactions   Amoxicillin Hives   Asa [Aspirin] Hives   Cefoxitin Itching and Rash   Sulfa Antibiotics Hives    Review of Systems: Review of Systems  Constitutional:  Negative for chills and fever.  HENT:         Reports that today she has had some clear rhinorrhea, postnasal drip, and nasal congestion congestion.  Otherwise her symptoms are off and on and especially around fresh mowed grass  Eyes:        Denies itchy watery eyes  Respiratory:  Positive for cough and wheezing. Negative for shortness of breath.        Reports mostly dry cough that can sound wet but not able to get anything up, wheezing at times this morning, and some tightness in her chest.  She also reports nocturnal awakenings last night due to these symptoms.  She denies  shortness of breath.  All of these symptoms have gotten better throughout the day after using her air duo 113 mcg and albuterol 3 puffs spaced throughout the day.  Cardiovascular:  Negative for chest pain and palpitations.  Gastrointestinal:        Denies heartburn or reflux symptoms  Genitourinary:  Negative for frequency.  Skin:  Negative for itching and rash.  Neurological:  Negative for headaches.  Endo/Heme/Allergies:  Positive for environmental allergies.    Physical Exam: BP 112/68   Pulse 97   Temp 98.1 F (36.7 C)   Resp 20   Ht '5\' 2"'$  (1.575 m)   Wt 187 lb 6 oz (85 kg)   SpO2 99%   BMI  34.27 kg/m    Physical Exam Constitutional:      Appearance: Normal appearance.  HENT:     Head: Normocephalic and atraumatic.     Comments: Pharynx normal, eyes normal, ears normal, nose: Bilateral lower turbinates mildly edematous and slightly erythematous with no drainage noted    Right Ear: Tympanic membrane, ear canal and external ear normal.     Left Ear: Tympanic membrane, ear canal and external ear normal.     Mouth/Throat:     Mouth: Mucous membranes are moist.     Pharynx: Oropharynx is clear.  Eyes:     Conjunctiva/sclera: Conjunctivae normal.  Cardiovascular:     Rate and Rhythm: Regular rhythm. Tachycardia present.     Heart sounds: Normal heart sounds.  Pulmonary:     Effort: Pulmonary effort is normal.     Breath sounds: Normal breath sounds.     Comments: Lungs clear to auscultation Musculoskeletal:     Cervical back: Neck supple.  Skin:    General: Skin is warm.  Neurological:     Mental Status: She is alert and oriented to person, place, and time.  Psychiatric:        Mood and Affect: Mood normal.        Behavior: Behavior normal.        Thought Content: Thought content normal.        Judgment: Judgment normal.    Diagnostics: FVC 3.08 L (87%), FEV1 2.57 L (86%).  Predicted FVC 3.53 L, predicted FEV1 2.98 L.  Spirometry indicates normal respiratory function.  Assessment and Plan: 1. Moderate persistent asthma without complication   2. Other seasonal allergic rhinitis     Meds ordered this encounter  Medications   Azelastine HCl 137 MCG/SPRAY SOLN    Sig: Place 2 sprays into both nostrils 2 (two) times daily as needed. 2 SPRAYS PER NOSTRIL 1-2 TIMES DAILY AS NEEDED.    Dispense:  30 mL    Refill:  3   fluticasone (FLONASE) 50 MCG/ACT nasal spray    Sig: Place 1 spray into both nostrils daily.    Dispense:  16 g    Refill:  3   levocetirizine (XYZAL) 5 MG tablet    Sig: Take 1 tablet (5 mg total) by mouth every evening.    Dispense:  30 tablet     Refill:  3   montelukast (SINGULAIR) 10 MG tablet    Sig: Take 1 tablet (10 mg total) by mouth at bedtime.    Dispense:  90 tablet    Refill:  1   AIRDUO DIGIHALER 113-14 MCG/ACT AEPB    Sig: Inhale 1 puff into the lungs 2 (two) times daily.    Dispense:  1 each    Refill:  5  ID: DIGIHALER Group: [id number] BIN: [id number] PCN: [id number]    Patient Instructions  1. Moderate persistent asthma, uncomplicated - Daily controller medication(s):  AirDuo 113/59mg one puff twice daily  + Fasenra every month x 3 doses (then every 8 weeks thereafter) - Prior to physical activity: albuterol 2 puffs 10-15 minutes before physical activity. - Rescue medications: albuterol 4 puffs every 4-6 hours as needed - Asthma control goals:  * Full participation in all desired activities (may need albuterol before activity) * Albuterol use two time or less a week on average (not counting use with activity) * Cough interfering with sleep two time or less a month * Oral steroids no more than once a year * No hospitalizations  2. Chronic rhinitis (grasses only) - Continue with: Xyzal (levocetirizine) '5mg'$  tablet once daily, Singulair (montelukast) '10mg'$  daily, and Flonase (fluticasone) one spray per nostril daily - Continue taking: Astelin (azelastine) 2 sprays per nostril 1-2 times daily as needed - You can use an extra dose of the antihistamine, if needed, for breakthrough symptoms.  - Consider nasal saline rinses 1-2 times daily to remove allergens from the nasal cavities as well as help with mucous clearance (this is especially helpful to do before the nasal sprays are given)  3. Schedule a follow up appointment in 4 months or sooner      Return in about 4 months (around 04/08/2022), or if symptoms worsen or fail to improve.    Thank you for the opportunity to care for this patient.  Please do not hesitate to contact me with questions.  CAlthea Charon FNP [facility]

## 2021-12-16 ENCOUNTER — Telehealth: Payer: Self-pay

## 2021-12-16 NOTE — Telephone Encounter (Signed)
Pa submitted thru cover my meds for airduo waiting on response key [id number]

## 2021-12-29 ENCOUNTER — Ambulatory Visit: Payer: BLUE CROSS/BLUE SHIELD | Admitting: Internal Medicine

## 2022-01-06 ENCOUNTER — Telehealth: Payer: Self-pay

## 2022-01-06 NOTE — Telephone Encounter (Signed)
I called the patient to inform her of the Airduo being denied. While I was informing her that we were going to switch her to Symbicort she stated she already tried Symbicort at a different facility. It did not help her symptoms and had a bad tasted I'm going to see if we can appeal the denial.

## 2022-01-06 NOTE — Telephone Encounter (Signed)
PA was denied due to the patient needing to try/ fail Advair Diskus, Advair HFA, Breo Ellipta, or Symbicort.  Please advise on an alternative.

## 2022-01-06 NOTE — Telephone Encounter (Signed)
Please let Nicole Rodriguez know that her insurance does not approve Airduo. Please try Symbicort 160/4.5 mcg using 2 puffs twice a day with spacer. Please see if she has a spacer. If not send one in and have her come by office for demonstration

## 2022-01-06 NOTE — Telephone Encounter (Signed)
Key: [id number] - PA Case ID: [id number] - Rx #: [id number]   PA has been completed for Airduo Digihaler is not approved patient will need to try/fail Advair, Breo,Breztri, or Airduo respiclick55/14.

## 2022-01-06 NOTE — Telephone Encounter (Signed)
Ok. Thank you.

## 2022-01-11 ENCOUNTER — Encounter: Payer: Self-pay | Admitting: Allergy & Immunology

## 2022-01-11 ENCOUNTER — Other Ambulatory Visit: Payer: Self-pay

## 2022-01-11 MED ORDER — SPACER/AERO-HOLDING CHAMBERS DEVI: 1.0000 | Freq: Two times a day (BID) | 0 refills | Status: DC

## 2022-01-11 MED ORDER — FLUTICASONE-SALMETEROL 115-21 MCG/ACT IN AERO: INHALATION_SPRAY | RESPIRATORY_TRACT | 5 refills | Status: DC

## 2022-01-11 NOTE — Telephone Encounter (Signed)
Please let [name] know that we could try Advair HFA. Please send in a prescription for Advair HFA 115/21 using 2 puffs twice a day with a spacer. Please send in a prescription for a spacer if she does not have one. She can come by the office for a demonstration too.

## 2022-01-11 NOTE — Telephone Encounter (Signed)
Patient called stating she is almost out of the AirDuo. She is wondering what will be the next steps since she has done Symbicort before?  Thanks

## 2022-01-11 NOTE — Telephone Encounter (Signed)
Patient sent a mychart message and Chrissie responded back.   Thanks

## 2022-01-12 MED ORDER — FLUTICASONE-SALMETEROL 113-14 MCG/ACT IN AEPB: INHALATION_SPRAY | RESPIRATORY_TRACT | 2 refills | Status: DC

## 2022-01-12 NOTE — Addendum Note (Signed)
Addended by: Althea Charon on: 01/12/2022 02:00 PM   Modules accepted: Orders

## 2022-01-12 NOTE — Telephone Encounter (Signed)
Please let Nicole Rodriguez know that I found on [employer] that she can get AirDuo 113/14 1 puff twice a day for $43.14 at [facility] in [place]. This would be cash pay. Please let me know if this is something she is interested in. She would use AirDuo in place of Advair HFA.

## 2022-01-23 ENCOUNTER — Other Ambulatory Visit (HOSPITAL_COMMUNITY): Payer: Self-pay | Admitting: *Deleted

## 2022-01-23 MED ORDER — BUPROPION HCL ER (SR) 200 MG PO TB12: 200.0000 mg | ORAL_TABLET | Freq: Two times a day (BID) | ORAL | 0 refills | Status: DC

## 2022-01-31 ENCOUNTER — Ambulatory Visit (HOSPITAL_BASED_OUTPATIENT_CLINIC_OR_DEPARTMENT_OTHER): Payer: BLUE CROSS/BLUE SHIELD | Admitting: Psychiatry

## 2022-01-31 DIAGNOSIS — F325 Major depressive disorder, single episode, in full remission: Secondary | ICD-10-CM | POA: Diagnosis not present

## 2022-01-31 MED ORDER — CLONAZEPAM 1 MG PO TABS
ORAL_TABLET | ORAL | 4 refills | Status: DC
Start: 2022-01-31 — End: 2022-06-06

## 2022-01-31 MED ORDER — BUPROPION HCL ER (SR) 200 MG PO TB12
ORAL_TABLET | ORAL | 3 refills | Status: DC
Start: 2022-01-31 — End: 2022-06-06

## 2022-01-31 NOTE — Progress Notes (Signed)
[facility] MD/PA/NP OP Progress Note  01/31/2022 4:03 PM Nicole Rodriguez  MRN:  [id number] Interview was conducted by phone and I verified that I was speaking with the correct person using two identifiers. I discussed the limitations of evaluation and management by telemedicine and  the availability of in person appointments. Patient expressed understanding and agreed to proceed. Participants in the visit: patient (location - home); physician (location - home office).  Chief Complaint: Panic attacks.  Rob Hickman.  Visit Diagnosis:    Today the patient is doing very well.  Her mood is great.  She really loves her job.  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  She is a very important job which she loves.  She loves her supervisor she loves the people she works with.  He is a very stable secure job.  She has minimal contact with her ex lover.  The patient's daughter whose name is Nicole Rodriguez is 32 years old and in kindergarten.  Nicole Rodriguez is well and healthy.  She seems to be very well adjusted.  Patient continues taking Wellbutrin 200 mg every morning.  She takes Klonopin 1 mg at night.  The patient is sleeping and eating well has good energy and enjoys television music.  Patient plays video games.  The most important thing the patient loves his family time.  She spends a lot of time with her mother and a number of her family members.  She denies any symptoms of mania.  Her mood is great.  I connected with [name] on 01/31/22 at  3:30 PM EDT by telephone and verified that I am speaking with the correct person using two identifiers.  Location: Patient: home Provider: office   I discussed the limitations, risks, security and privacy concerns of performing an evaluation and management service by telephone and the availability of in person appointments. I also discussed with the patient that there may be a patient responsible charge related to this service. The patient expressed understanding and agreed  to proceed.      I discussed the assessment and treatment plan with the patient. The patient was provided an opportunity to ask questions and all were answered. The patient agreed with the plan and demonstrated an understanding of the instructions.   The patient was advised to call back or seek an in-person evaluation if the symptoms worsen or if the condition fails to improve as anticipated.  I provided 30 minutes of non-face-to-face time during this encounter.   Jerral Ralph, MD  No diagnosis found.  Past Psychiatric History: Please see intake H&P.  Past Medical History:  Past Medical History:  Diagnosis Date   Asthma    Bipolar disorder ([place])    Generalized anxiety disorder    GERD (gastroesophageal reflux disease)    Phreesia 08/17/2020   IBS (irritable bowel syndrome)    Plantar fasciitis 10/15/2018   Rectal bleeding 09/24/2019   Social anxiety disorder     Past Surgical History:  Procedure Laterality Date   APPENDECTOMY     BREAST LUMPECTOMY     COLONOSCOPY WITH PROPOFOL N/A 12/03/2019   Procedure: COLONOSCOPY WITH PROPOFOL;  Surgeon: Virgel Manifold, MD;  Location: [facility] END[facility]COPY;  Service: Endoscopy;  Laterality: N/A;   CYSTOSCOPY W/ URETERAL STENT PLACEMENT Right 08/31/2021   Procedure: CYSTOSCOPY WITH BILATERAL RETROGRADE PYELOGRAM;  Surgeon: Primus Bravo., MD;  Location: [facility];  Service: Urology;  Laterality: Right;   ESOPHAGOGASTRODUODENOSCOPY (EGD) WITH PROPOFOL N/A 12/03/2019   Procedure: ESOPHAGOGASTRODUODENOSCOPY (  EGD) WITH PROPOFOL;  Surgeon: Virgel Manifold, MD;  Location: [facility] END[facility]COPY;  Service: Endoscopy;  Laterality: N/A;   FOOT SURGERY     URETEROSCOPY Right 08/31/2021   Procedure: URETEROSCOPY;  Surgeon: Primus Bravo., MD;  Location: [facility];  Service: Urology;  Laterality: Right;    Family Psychiatric History: Reviewed.  Family History:  Family History  Problem Relation Age of Onset   Allergic rhinitis Father    Asthma  Father    Crohn's disease Sister        half sister   Anxiety disorder Maternal Grandmother    Depression Maternal Grandmother    Diverticulitis Paternal Grandmother    Diabetes Other    Heart failure Other    Colon cancer Neg Hx    Celiac disease Neg Hx     Social History:  Social History   Socioeconomic History   Marital status: Legally Separated    Spouse name: Not on file   Number of children: 1   Years of education: Not on file   Highest education level: Not on file  Occupational History   Not on file  Tobacco Use   Smoking status: Never    Passive exposure: Never   Smokeless tobacco: Never  Vaping Use   Vaping Use: Never used  Substance and Sexual Activity   Alcohol use: Yes    Comment: socially    Drug use: No   Sexual activity: Yes    Birth control/protection: None  Other Topics Concern   Not on file  Social History Narrative   Daughter 32 years old.   Social Determinants of Health   Financial Resource Strain: Not on file  Food Insecurity: Not on file  Transportation Needs: Not on file  Physical Activity: Not on file  Stress: Not on file  Social Connections: Not on file    Allergies:  Allergies  Allergen Reactions   Amoxicillin Hives   Asa [Aspirin] Hives   Cefoxitin Itching and Rash   Sulfa Antibiotics Hives    Metabolic Disorder Labs: Lab Results  Component Value Date   HGBA1C 5.2 02/01/2017   No results found for: "PROLACTIN" Lab Results  Component Value Date   CHOL 159 02/01/2017   TRIG 85 02/01/2017   HDL 44 02/01/2017   CHOLHDL 3.6 02/01/2017   LDLCALC 98 02/01/2017   LDLCALC 112 (H) 09/14/2016   Lab Results  Component Value Date   TSH 1.500 09/14/2016    Therapeutic Level Labs: No results found for: "LITHIUM" No results found for: "VALPROATE" No results found for: "CBMZ"  Current Medications: Current Outpatient Medications  Medication Sig Dispense Refill   albuterol (PROVENTIL) (2.5 MG/3ML) 0.083% nebulizer solution  Take 3 mLs (2.5 mg total) by nebulization every 6 (six) hours as needed for wheezing or shortness of breath. 75 mL 3   albuterol (VENTOLIN HFA) 108 (90 Base) MCG/ACT inhaler Inhale 2 puffs into the lungs every 6 (six) hours as needed for wheezing or shortness of breath. 8 g 5   Azelastine HCl 137 MCG/SPRAY SOLN Place 2 sprays into both nostrils 2 (two) times daily as needed. 2 SPRAYS PER NOSTRIL 1-2 TIMES DAILY AS NEEDED. 30 mL 3   buPROPion (WELLBUTRIN SR) 200 MG 12 hr tablet 1 bid 60 tablet 3   clonazePAM (KLONOPIN) 1 MG tablet 1  qhs 30 tablet 4   FASENRA PEN 30 MG/ML SOAJ Inject 30 mg into the skin every 28 (twenty-eight) days. Benralizumab. For severe eosinophilic asthma  fluticasone (FLONASE) 50 MCG/ACT nasal spray Place 1 spray into both nostrils daily. 16 g 3   Fluticasone-Salmeterol 113-14 MCG/ACT AEPB Inhale 1 puff twice a day to help prevent cough and wheeze 1 each 2   levocetirizine (XYZAL) 5 MG tablet Take 1 tablet (5 mg total) by mouth every evening. 30 tablet 3   montelukast (SINGULAIR) 10 MG tablet Take 1 tablet (10 mg total) by mouth at bedtime. 90 tablet 1   ondansetron (ZOFRAN-ODT) 4 MG disintegrating tablet Take 1 tablet (4 mg total) by mouth every 8 (eight) hours as needed for nausea or vomiting. 10 tablet 0   pantoprazole (PROTONIX) 40 MG tablet Take 1 tablet (40 mg total) by mouth 2 (two) times daily before a meal. 60 tablet 5   phenazopyridine (PYRIDIUM) 200 MG tablet Take 1 tablet (200 mg total) by mouth 3 (three) times daily as needed for pain. 20 tablet 0   predniSONE (DELTASONE) 50 MG tablet Take 1 tablet (50 mg total) by mouth daily with breakfast. 5 tablet 0   promethazine-dextromethorphan (PROMETHAZINE-DM) 6.25-15 MG/5ML syrup Take 5 mLs by mouth 3 (three) times daily as needed for cough. 200 mL 0   Spacer/Aero-Holding Chambers DEVI Apply 1 Device topically 2 (two) times daily. 1 each 0   traZODone (DESYREL) 50 MG tablet Take 1 tablet by mouth as needed.     No  current facility-administered medications for this visit.      Psychiatric Specialty Exam: Review of Systems  Psychiatric/Behavioral:  The patient is nervous/anxious.   All other systems reviewed and are negative.   There were no vitals taken for this visit.There is no height or weight on file to calculate BMI.  General Appearance: NA  Eye Contact:  NA  Speech:  Clear and Coherent and Normal Rate  Volume:  Normal  Mood:  Anxious  Affect:  NA  Thought Process:  Goal Directed  Orientation:  Full (Time, Place, and Person)  Thought Content: Rumination   Suicidal Thoughts:  No  Homicidal Thoughts:  No  Memory:  Immediate;   Good Recent;   Good Remote;   Good  Judgement:  Good  Insight:  Fair  Psychomotor Activity:  NA  Concentration:  Concentration: Fair  Recall:  Good  Fund of Knowledge: Good  Language: Good  Akathisia:  Negative  Handed:  Right  AIMS (if indicated): not done  Assets:  Communication Skills Desire for Improvement Financial Resources/Insurance Housing Social Support Talents/Skills  ADL's:  Intact  Cognition: WNL  Sleep:  Good   Screenings: GAD-7    Flowsheet Row Office Visit from 06/18/2019 in [facility] Office Visit from 06/05/2019 in [facility] Office Visit from 04/01/2019 in [facility]  Total GAD-7 Score 20 19 0      PHQ2-9    [place] Office Visit from 06/30/2021 in [facility] Video Visit from 11/15/2020 in [facility] Office Visit from 08/20/2020 in [facility] Office Visit from 06/18/2019 in [facility] Office Visit from 06/05/2019 in [facility]  PHQ-2 Total Score 0 0 0 5 6  PHQ-9 Total Score -- -- 0 20 [place] ED from 10/10/2021 in [facility] Admission (Discharged) from 08/31/2021 in [facility] 45 from 08/30/2021 in [facility] No Risk No Risk No Risk        Assessment and Plan:  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  Patient continues taking Wellbutrin 200 mg slow release ideally she would take it twice daily but she actually takes 1 in the morning.  I think it is okay that she takes a low-dose of this agent.  The patient also takes 1 mg of Klonopin which helps her sleep.  At this time the patient is not in therapy.  The patient has a number of close friends and has a good support system with friends and family.  She is looking forward to getting a job promotion in the next few months.  The patient drinks no alcohol and uses no drugs.  She is very stable.  She will be seen in this office again in 4 months.   Dx: Bipolar 2 disorder rapid cycling; Mixed anxiety disorder (panic disorder' GAD, social anxiety)   Plan:  Continue Lamictal to 200 mg at HS, Latuda 40 mg at HS, paroxetine 20 mg in PM, trazodone 50 mg prn sleep and bupropion SR 200 mg bid for fatigue. I will stop lorazepam 2 mg prn anxiety (up to tid) and start clonazepam 1 mg bid scheduled initially (then advised to start using it prn after anxiety decreases). We shall write her an excuse from work note for 3/26-4/18/22 period.  Next appointment with a new provider in 1 month. The plan was discussed with patient who had an opportunity to ask questions and these were all answered. I spend 15 minutes in phone consultation with the patient.    Jerral Ralph, MD 01/31/2022, 4:03 PM

## 2022-02-16 ENCOUNTER — Encounter: Payer: Self-pay | Admitting: Allergy & Immunology

## 2022-02-17 NOTE — Telephone Encounter (Signed)
Can we will fill out a form for her to get a 6 month handicap sign? I will be happy to sign that.   Salvatore Marvel, MD [facility]

## 2022-03-07 ENCOUNTER — Encounter (HOSPITAL_COMMUNITY): Payer: Self-pay

## 2022-03-07 ENCOUNTER — Ambulatory Visit (HOSPITAL_COMMUNITY)
Admission: RE | Admit: 2022-03-07 | Discharge: 2022-03-07 | Disposition: A | Discharge status: Home / Self Care | Payer: BLUE CROSS/BLUE SHIELD | Source: Ambulatory Visit | Attending: Emergency Medicine | Admitting: Emergency Medicine

## 2022-03-07 VITALS — BP 145/106 | HR 100 | Temp 98.3°F | Resp 18

## 2022-03-07 DIAGNOSIS — J069 Acute upper respiratory infection, unspecified: Secondary | ICD-10-CM | POA: Insufficient documentation

## 2022-03-07 DIAGNOSIS — U071 COVID-19: Secondary | ICD-10-CM | POA: Diagnosis not present

## 2022-03-07 DIAGNOSIS — J028 Acute pharyngitis due to other specified organisms: Secondary | ICD-10-CM | POA: Insufficient documentation

## 2022-03-07 DIAGNOSIS — J029 Acute pharyngitis, unspecified: Secondary | ICD-10-CM

## 2022-03-07 DIAGNOSIS — B9789 Other viral agents as the cause of diseases classified elsewhere: Secondary | ICD-10-CM | POA: Diagnosis not present

## 2022-03-07 LAB — SARS CORONAVIRUS 2 BY RT PCR: SARS Coronavirus 2 by RT PCR: POSITIVE — AB

## 2022-03-07 LAB — POCT RAPID STREP A, ED / UC: Streptococcus, Group A Screen (Direct): NEGATIVE

## 2022-03-07 LAB — POC INFLUENZA A AND B ANTIGEN (URGENT CARE ONLY)
INFLUENZA A ANTIGEN, POC: NEGATIVE
INFLUENZA B ANTIGEN, POC: NEGATIVE

## 2022-03-07 NOTE — ED Triage Notes (Signed)
Pt reports a sore throat and cough x 2 days. States swallowing is painful and has been taking Tylenol Severe Sinus and Theraflu for relief.

## 2022-03-07 NOTE — Discharge Instructions (Addendum)
Your strep and flu tests were negative. We will call you if your covid test returns positive. If covid is negative, you likely have another virus causing your symptoms.   I recommend symptomatic care with ibuprofen up to 800 mg every 6 hours. You can try salt water gargles, lozenges, or over the counter throat sprays. It may take a few days for your symptoms to get better.

## 2022-03-07 NOTE — ED Provider Notes (Signed)
[place]    CSN: [id number] Arrival date & time: 03/07/22  1421      History   Chief Complaint Chief Complaint  Patient presents with   Sore Throat    Sore throat and cough for 2 days and overall not feeling well. - Entered by patient   Cough    HPI Nicole Rodriguez is a 31 y.o. female.  Presents with 2 day history of sore throat and fatigue. Reports 8/10 pain with swallowing. Didn't improve with ibuprofen. Denies fevers. Reports dry cough, has tried Theraflu and tylenol sinus. Feels the cough is due to throat pain. No GI symptoms.  No known sick contacts  Past Medical History:  Diagnosis Date   Asthma    Bipolar disorder ([place])    Generalized anxiety disorder    GERD (gastroesophageal reflux disease)    Phreesia 08/17/2020   IBS (irritable bowel syndrome)    Plantar fasciitis 10/15/2018   Rectal bleeding 09/24/2019   Social anxiety disorder     Patient Active Problem List   Diagnosis Date Noted   Intermittent palpitations 08/20/2020   Gastric polyp    GAD (generalized anxiety disorder) 07/19/2019   Social anxiety disorder 07/19/2019   Panic disorder 07/19/2019   Bipolar 2 disorder ([place]) 07/19/2019   Allergic rhinitis 08/19/2018   GERD (gastroesophageal reflux disease) 08/19/2018   Asthma 11/29/2017   Breast mass, right 03/18/2014   Numbness and tingling in both hands 01/26/2014    Past Surgical History:  Procedure Laterality Date   APPENDECTOMY     BREAST LUMPECTOMY     COLONOSCOPY WITH PROPOFOL N/A 12/03/2019   Procedure: COLONOSCOPY WITH PROPOFOL;  Surgeon: Virgel Manifold, MD;  Location: [facility];  Service: Endoscopy;  Laterality: N/A;   CYSTOSCOPY W/ URETERAL STENT PLACEMENT Right 08/31/2021   Procedure: CYSTOSCOPY WITH BILATERAL RETROGRADE PYELOGRAM;  Surgeon: Primus Bravo., MD;  Location: [facility];  Service: Urology;  Laterality: Right;   ESOPHAGOGASTRODUODENOSCOPY (EGD) WITH PROPOFOL N/A 12/03/2019   Procedure:  ESOPHAGOGASTRODUODENOSCOPY (EGD) WITH PROPOFOL;  Surgeon: Virgel Manifold, MD;  Location: [facility];  Service: Endoscopy;  Laterality: N/A;   FOOT SURGERY     URETEROSCOPY Right 08/31/2021   Procedure: URETEROSCOPY;  Surgeon: Primus Bravo., MD;  Location: [facility];  Service: Urology;  Laterality: Right;    OB History     Gravida  0   Para  0   Term  0   Preterm  0   AB  0   Living  0      SAB  0   IAB  0   Ectopic  0   Multiple  0   Live Births  0            Home Medications    Prior to Admission medications   Medication Sig Start Date End Date Taking? Authorizing Provider  albuterol (PROVENTIL) (2.5 MG/3ML) 0.083% nebulizer solution Take 3 mLs (2.5 mg total) by nebulization every 6 (six) hours as needed for wheezing or shortness of breath. 02/11/20   Malachy Mood, MD  albuterol (VENTOLIN HFA) 108 (90 Base) MCG/ACT inhaler Inhale 2 puffs into the lungs every 6 (six) hours as needed for wheezing or shortness of breath. 06/30/21   Lindell Spar, MD  Azelastine HCl 137 MCG/SPRAY SOLN Place 2 sprays into both nostrils 2 (two) times daily as needed. 2 SPRAYS PER NOSTRIL 1-2 TIMES DAILY AS NEEDED. 12/07/21   Althea Charon, FNP  buPROPion [facility] SR) 200 MG  12 hr tablet 1 bid 01/31/22   Plovsky, Berneta Sages, MD  clonazePAM (KLONOPIN) 1 MG tablet 1  qhs 01/31/22   Plovsky, Berneta Sages, MD  FASENRA PEN 30 MG/ML SOAJ Inject 30 mg into the skin every 28 (twenty-eight) days. Benralizumab. For severe eosinophilic asthma 1/32/44   [provider]  fluticasone (FLONASE) 50 MCG/ACT nasal spray Place 1 spray into both nostrils daily. 12/07/21   Althea Charon, FNP  Fluticasone-Salmeterol [phone] MCG/ACT AEPB Inhale 1 puff twice a day to help prevent cough and wheeze 01/12/22   Althea Charon, FNP  levocetirizine (XYZAL) 5 MG tablet Take 1 tablet (5 mg total) by mouth every evening. 12/07/21   Althea Charon, FNP  montelukast (SINGULAIR) 10 MG tablet Take 1 tablet (10  mg total) by mouth at bedtime. 12/07/21   Althea Charon, FNP  ondansetron (ZOFRAN-ODT) 4 MG disintegrating tablet Take 1 tablet (4 mg total) by mouth every 8 (eight) hours as needed for nausea or vomiting. 08/31/21   Stoneking, Reece Leader., MD  pantoprazole (PROTONIX) 40 MG tablet Take 1 tablet (40 mg total) by mouth 2 (two) times daily before a meal. 08/22/21   Lindell Spar, MD  phenazopyridine (PYRIDIUM) 200 MG tablet Take 1 tablet (200 mg total) by mouth 3 (three) times daily as needed for pain. 08/31/21 08/31/22  Stoneking, Reece Leader., MD  predniSONE (DELTASONE) 50 MG tablet Take 1 tablet (50 mg total) by mouth daily with breakfast. 10/10/21   Jaynee Eagles, PA-C  promethazine-dextromethorphan (PROMETHAZINE-DM) 6.25-15 MG/5ML syrup Take 5 mLs by mouth 3 (three) times daily as needed for cough. 10/10/21   Jaynee Eagles, PA-C  Spacer/Aero-Holding Chambers DEVI Apply 1 Device topically 2 (two) times daily. 01/11/22   Althea Charon, FNP  traZODone (DESYREL) 50 MG tablet Take 1 tablet by mouth as needed. 12/03/20   [provider]    Family History Family History  Problem Relation Age of Onset   Allergic rhinitis Father    Asthma Father    Crohn's disease Sister        half sister   Anxiety disorder Maternal Grandmother    Depression Maternal Grandmother    Diverticulitis Paternal Grandmother    Diabetes Other    Heart failure Other    Colon cancer Neg Hx    Celiac disease Neg Hx     Social History Social History   Tobacco Use   Smoking status: Never    Passive exposure: Never   Smokeless tobacco: Never  Vaping Use   Vaping Use: Never used  Substance Use Topics   Alcohol use: Yes    Comment: socially    Drug use: No     Allergies   Amoxicillin, Asa [aspirin], Cefoxitin, and Sulfa antibiotics   Review of Systems Review of Systems  HENT:  Positive for sore throat.    Per HPI   Physical Exam Triage Vital Signs ED Triage Vitals [03/07/22 1438]  Enc Vitals Group      BP      Pulse      Resp      Temp      Temp src      SpO2      Weight      Height      Head Circumference      Peak Flow      Pain Score 8     Pain Loc      Pain Edu?      Excl. in [place]?    No data  found.  Updated Vital Signs BP (!) 145/106 (BP Location: Left Arm)   Pulse 100   Temp 98.3 F (36.8 C) (Oral)   Resp 18   SpO2 96%     Physical Exam Vitals and nursing note reviewed.  Constitutional:      General: She is not in acute distress. HENT:     Nose: No congestion.     Mouth/Throat:     Mouth: Mucous membranes are moist.     Pharynx: Uvula midline. Posterior oropharyngeal erythema present.     Tonsils: No tonsillar exudate or tonsillar abscesses.  Eyes:     Conjunctiva/sclera: Conjunctivae normal.  Cardiovascular:     Rate and Rhythm: Normal rate and regular rhythm.     Pulses: Normal pulses.     Heart sounds: Normal heart sounds.  Pulmonary:     Effort: Pulmonary effort is normal.     Breath sounds: Normal breath sounds.  Musculoskeletal:     Cervical back: Normal range of motion.  Lymphadenopathy:     Cervical: No cervical adenopathy.  Neurological:     Mental Status: She is alert and oriented to person, place, and time.     UC Treatments / Results  Labs (all labs ordered are listed, but only abnormal results are displayed) Labs Reviewed  SARS CORONAVIRUS 2 BY RT PCR  POCT RAPID STREP A, ED / UC  POC INFLUENZA A AND B ANTIGEN (URGENT CARE ONLY)    EKG  Radiology No results found.  Procedures Procedures   Medications Ordered in UC Medications - No data to display  Initial Impression / Assessment and Plan / UC Course  I have reviewed the triage vital signs and the nursing notes.  Pertinent labs & imaging results that were available during my care of the patient were reviewed by me and considered in my medical decision making (see chart for details).  With 8/10 pain with swallowing, strep test ordered and was negative. Likely viral  etiology of symptoms Flu negative. COVID pending. Recommend symptomatic care, ibuprofen/tylenol, salt water gargles, lozenges, sprays as needed. Work note provided. Return precautions discussed. Patient agrees to plan  Final Clinical Impressions(s) / UC Diagnoses   Final diagnoses:  Viral pharyngitis  Viral URI     Discharge Instructions      Your strep and flu tests were negative. We will call you if your covid test returns positive. If covid is negative, you likely have another virus causing your symptoms.   I recommend symptomatic care with ibuprofen up to 800 mg every 6 hours. You can try salt water gargles, lozenges, or over the counter throat sprays. It may take a few days for your symptoms to get better.     ED Prescriptions   None    PDMP not reviewed this encounter.   Adellyn Capek, [place] Guiles, PA-C 03/07/22 1520

## 2022-03-08 ENCOUNTER — Encounter: Payer: Self-pay | Admitting: Allergy & Immunology

## 2022-03-08 ENCOUNTER — Encounter: Payer: Self-pay | Admitting: Internal Medicine

## 2022-03-08 ENCOUNTER — Ambulatory Visit: Payer: BLUE CROSS/BLUE SHIELD | Admitting: Internal Medicine

## 2022-03-08 DIAGNOSIS — U071 COVID-19: Secondary | ICD-10-CM

## 2022-03-08 MED ORDER — NIRMATRELVIR/RITONAVIR (PAXLOVID)TABLET: 3.0000 | ORAL_TABLET | Freq: Two times a day (BID) | ORAL | 0 refills | Status: AC

## 2022-03-08 MED ORDER — DEXAMETHASONE 6 MG PO TABS: 6.0000 mg | ORAL_TABLET | Freq: Every day | ORAL | 0 refills | Status: DC

## 2022-03-08 MED ORDER — BENZONATATE 100 MG PO CAPS: 100.0000 mg | ORAL_CAPSULE | Freq: Two times a day (BID) | ORAL | 0 refills | Status: DC | PRN

## 2022-03-08 NOTE — Progress Notes (Signed)
Virtual Visit via Telephone Note   This visit type was conducted due to national recommendations for restrictions regarding the COVID-19 Pandemic (e.g. social distancing) in an effort to limit this patient's exposure and mitigate transmission in our community.  Due to her co-morbid illnesses, this patient is at least at moderate risk for complications without adequate follow up.  This format is felt to be most appropriate for this patient at this time.  The patient did not have access to video technology/had technical difficulties with video requiring transitioning to audio format only (telephone).  All issues noted in this document were discussed and addressed.  No physical exam could be performed with this format.  Evaluation Performed:  Follow-up visit  Date:  03/08/2022   ID:  Nicole Rodriguez, DOB 1990/12/17, MRN [id number]  Patient Location: Home Provider Location: Office/Clinic  Participants: Patient Location of Patient: Home Location of Provider: Telehealth Consent was obtain for visit to be over via telehealth. I verified that I am speaking with the correct person using two identifiers.  PCP:  Lindell Spar, MD   Chief Complaint: Cough, nasal congestion and sore throat  History of Present Illness:    Nicole Rodriguez is a 31 y.o. female who has a televisit for c/o cough, nasal congestion and sore throat for the last 3 days.  She went to urgent care yesterday, and had COVID test done, which resulted positive today.  She has mild dyspnea, but denies wheezing or hemoptysis.  Denies any fever currently.  The patient does have symptoms concerning for COVID-19 infection (fever, chills, cough, or new shortness of breath).   Past Medical, Surgical, Social History, Allergies, and Medications have been Reviewed.  Past Medical History:  Diagnosis Date   Asthma    Bipolar disorder ([place])    Generalized anxiety disorder    GERD (gastroesophageal reflux disease)    Phreesia  08/17/2020   IBS (irritable bowel syndrome)    Plantar fasciitis 10/15/2018   Rectal bleeding 09/24/2019   Social anxiety disorder    Past Surgical History:  Procedure Laterality Date   APPENDECTOMY     BREAST LUMPECTOMY     COLONOSCOPY WITH PROPOFOL N/A 12/03/2019   Procedure: COLONOSCOPY WITH PROPOFOL;  Surgeon: Virgel Manifold, MD;  Location: [facility];  Service: Endoscopy;  Laterality: N/A;   CYSTOSCOPY W/ URETERAL STENT PLACEMENT Right 08/31/2021   Procedure: CYSTOSCOPY WITH BILATERAL RETROGRADE PYELOGRAM;  Surgeon: Primus Bravo., MD;  Location: [facility];  Service: Urology;  Laterality: Right;   ESOPHAGOGASTRODUODENOSCOPY (EGD) WITH PROPOFOL N/A 12/03/2019   Procedure: ESOPHAGOGASTRODUODENOSCOPY (EGD) WITH PROPOFOL;  Surgeon: Virgel Manifold, MD;  Location: [facility];  Service: Endoscopy;  Laterality: N/A;   FOOT SURGERY     URETEROSCOPY Right 08/31/2021   Procedure: URETEROSCOPY;  Surgeon: Primus Bravo., MD;  Location: [facility];  Service: Urology;  Laterality: Right;     Current Meds  Medication Sig   albuterol (PROVENTIL) (2.5 MG/3ML) 0.083% nebulizer solution Take 3 mLs (2.5 mg total) by nebulization every 6 (six) hours as needed for wheezing or shortness of breath.   albuterol (VENTOLIN HFA) 108 (90 Base) MCG/ACT inhaler Inhale 2 puffs into the lungs every 6 (six) hours as needed for wheezing or shortness of breath.   Azelastine HCl 137 MCG/SPRAY SOLN Place 2 sprays into both nostrils 2 (two) times daily as needed. 2 SPRAYS PER NOSTRIL 1-2 TIMES DAILY AS NEEDED.   benzonatate (TESSALON) 100 MG capsule Take 1 capsule (100 mg total)  by mouth 2 (two) times daily as needed for cough.   buPROPion (WELLBUTRIN SR) 200 MG 12 hr tablet 1 bid   clonazePAM (KLONOPIN) 1 MG tablet 1  qhs   dexamethasone (DECADRON) 6 MG tablet Take 1 tablet (6 mg total) by mouth daily.   FASENRA PEN 30 MG/ML SOAJ Inject 30 mg into the skin every 28 (twenty-eight) days. Benralizumab.  For severe eosinophilic asthma   fluticasone (FLONASE) 50 MCG/ACT nasal spray Place 1 spray into both nostrils daily.   Fluticasone-Salmeterol 113-14 MCG/ACT AEPB Inhale 1 puff twice a day to help prevent cough and wheeze   levocetirizine (XYZAL) 5 MG tablet Take 1 tablet (5 mg total) by mouth every evening.   montelukast (SINGULAIR) 10 MG tablet Take 1 tablet (10 mg total) by mouth at bedtime.   nirmatrelvir/ritonavir EUA (PAXLOVID) 20 x 150 MG & 10 x '100MG'$  TABS Take 3 tablets by mouth 2 (two) times daily for 5 days. (Take nirmatrelvir 150 mg two tablets twice daily for 5 days and ritonavir 100 mg one tablet twice daily for 5 days) Patient GFR is >60.   pantoprazole (PROTONIX) 40 MG tablet Take 1 tablet (40 mg total) by mouth 2 (two) times daily before a meal.   traZODone (DESYREL) 50 MG tablet Take 1 tablet by mouth as needed.     Allergies:   Amoxicillin, Asa [aspirin], Cefoxitin, and Sulfa antibiotics   ROS:   Please see the history of present illness.     All other systems reviewed and are negative.   Labs/Other Tests and Data Reviewed:    Recent Labs: 08/06/2021: B Natriuretic Peptide 5.0 08/22/2021: BUN 9; Creatinine, Ser 0.78; Hemoglobin 13.8; Platelets 304; Potassium 3.2; Sodium 135   Recent Lipid Panel Lab Results  Component Value Date/Time   CHOL 159 02/01/2017 12:00 AM   TRIG 85 02/01/2017 12:00 AM   HDL 44 02/01/2017 12:00 AM   CHOLHDL 3.6 02/01/2017 12:00 AM   LDLCALC 98 02/01/2017 12:00 AM    Wt Readings from Last 3 Encounters:  12/07/21 187 lb 6 oz (85 kg)  09/07/21 208 lb 3.2 oz (94.4 kg)  08/31/21 207 lb 14.3 oz (94.3 kg)     ASSESSMENT & PLAN:    COVID-19 infection Started Paxlovid as she has underlying asthma Dexamethasone 6 mg daily X 10 days Tessalon as needed for cough Self quarantine for at least 5 days from symptom onset and at least 24 hours afebrile period   Time:   Today, I have spent 12 minutes reviewing the chart, including problem list,  medications, and with the patient with telehealth technology discussing the above problems.   Medication Adjustments/Labs and Tests Ordered: Current medicines are reviewed at length with the patient today.  Concerns regarding medicines are outlined above.   Tests Ordered: No orders of the defined types were placed in this encounter.   Medication Changes: Meds ordered this encounter  Medications   nirmatrelvir/ritonavir EUA (PAXLOVID) 20 x 150 MG & 10 x '100MG'$  TABS    Sig: Take 3 tablets by mouth 2 (two) times daily for 5 days. (Take nirmatrelvir 150 mg two tablets twice daily for 5 days and ritonavir 100 mg one tablet twice daily for 5 days) Patient GFR is >60.    Dispense:  30 tablet    Refill:  0   benzonatate (TESSALON) 100 MG capsule    Sig: Take 1 capsule (100 mg total) by mouth 2 (two) times daily as needed for cough.    Dispense:  20 capsule    Refill:  0   dexamethasone (DECADRON) 6 MG tablet    Sig: Take 1 tablet (6 mg total) by mouth daily.    Dispense:  10 tablet    Refill:  0     Note: This dictation was prepared with Dragon dictation along with smaller phrase technology. Similar sounding words can be transcribed inadequately or may not be corrected upon review. Any transcriptional errors that result from this process are unintentional.      Disposition:  Follow up  Signed, Lindell Spar, MD  03/08/2022 11:47 AM     [place]

## 2022-03-08 NOTE — Patient Instructions (Addendum)
Please take Paxlovid and dexamethasone as prescribed.  Please take Tessalon as needed for cough.  Continue to use Airduo and as needed albuterol for asthma.

## 2022-03-08 NOTE — Telephone Encounter (Signed)
FYI, patient also sent a message to PCP

## 2022-03-09 ENCOUNTER — Encounter: Payer: Self-pay | Admitting: *Deleted

## 2022-03-19 ENCOUNTER — Encounter: Payer: Self-pay | Admitting: Allergy & Immunology

## 2022-03-21 MED ORDER — FAMOTIDINE 40 MG PO TABS: 40.0000 mg | ORAL_TABLET | Freq: Two times a day (BID) | ORAL | 1 refills | Status: DC

## 2022-03-21 MED ORDER — SUCRALFATE 1 G PO TABS: 1.0000 g | ORAL_TABLET | Freq: Three times a day (TID) | ORAL | 1 refills | Status: DC

## 2022-03-25 ENCOUNTER — Other Ambulatory Visit (HOSPITAL_COMMUNITY): Payer: Self-pay | Admitting: Psychiatry

## 2022-03-25 ENCOUNTER — Other Ambulatory Visit: Payer: Self-pay | Admitting: Internal Medicine

## 2022-03-25 DIAGNOSIS — K219 Gastro-esophageal reflux disease without esophagitis: Secondary | ICD-10-CM

## 2022-04-05 ENCOUNTER — Ambulatory Visit: Payer: BLUE CROSS/BLUE SHIELD | Admitting: Physician Assistant

## 2022-04-12 ENCOUNTER — Other Ambulatory Visit: Payer: Self-pay | Admitting: Allergy & Immunology

## 2022-04-12 ENCOUNTER — Ambulatory Visit: Payer: BLUE CROSS/BLUE SHIELD | Admitting: Family

## 2022-04-19 ENCOUNTER — Ambulatory Visit (INDEPENDENT_AMBULATORY_CARE_PROVIDER_SITE_OTHER): Payer: BLUE CROSS/BLUE SHIELD | Admitting: Allergy & Immunology

## 2022-04-19 ENCOUNTER — Encounter: Payer: Self-pay | Admitting: Allergy & Immunology

## 2022-04-19 VITALS — BP 128/90 | HR 116 | Temp 98.7°F | Resp 16 | Ht 63.0 in | Wt 173.0 lb

## 2022-04-19 DIAGNOSIS — J302 Other seasonal allergic rhinitis: Secondary | ICD-10-CM

## 2022-04-19 DIAGNOSIS — J4540 Moderate persistent asthma, uncomplicated: Secondary | ICD-10-CM | POA: Diagnosis not present

## 2022-04-19 MED ORDER — FLUTICASONE PROPIONATE 50 MCG/ACT NA SUSP: 1.0000 | Freq: Every day | NASAL | 3 refills | Status: DC

## 2022-04-19 MED ORDER — FLUTICASONE-SALMETEROL 250-50 MCG/ACT IN AEPB: 1.0000 | INHALATION_SPRAY | Freq: Two times a day (BID) | RESPIRATORY_TRACT | 5 refills | Status: DC

## 2022-04-19 NOTE — Progress Notes (Signed)
FOLLOW UP  Date of Service/Encounter:  04/19/22   Assessment:   Moderate persistent asthma, uncomplicated - with AEC 810 March 2022, doing well on Faserna   Failed Breo and Symbicort (starting Advair today)   Chronic rhinitis - with minimally reactive testing to [place]   Recurrent prednisone courses in the last 6 months - improved with initiation of Fasenra    Plan/Recommendations:   1. Moderate persistent asthma, uncomplicated - Lung testing looks awesome today. - We are going to try Advair 246mg one puff twice daily instead to see if this works as well as AirDuo. - We are going to start AirSupra two puffs as needed for rescue (in lieu of your albuterol). - this contains albuterol combined with an inhaled steroid.  - Daily controller medication(s): Advair 250/597m one puff twice daily - Prior to physical activity: AirSupra 2 puffs 10-15 minutes before physical activity. - Rescue medications: AirSupra two puffs every 4-6 hours as needed.  - Asthma control goals:  * Full participation in all desired activities (may need albuterol before activity) * Albuterol use two time or less a week on average (not counting use with activity) * Cough interfering with sleep two time or less a month * Oral steroids no more than once a year * No hospitalizations  2. Chronic rhinitis ([place] only) - Continue with: Xyzal (levocetirizine) '5mg'$  tablet once daily, Singulair (montelukast) '10mg'$  daily, and Flonase (fluticasone) one spray per nostril daily and Astelin (azelastine) 2 sprays per nostril 1-2 times daily as needed - You can use an extra dose of the antihistamine, if needed, for breakthrough symptoms.  - Consider nasal saline rinses 1-2 times daily to remove allergens from the nasal cavities as well as help with mucous clearance (this is especially helpful to do before the nasal sprays are given)  3. Return in about 4 months (around 08/20/2022).    Subjective:   Nicole Rodriguez a  [age].o. female presenting today for follow up of  Chief Complaint  Patient presents with   Asthma    Only has issues w physical activity    Allergic Rhinitis    Medication Refill    Flonase, Airduo     Nicole Rodriguez a history of the following: Patient Active Problem List   Diagnosis Date Noted   Intermittent palpitations 08/20/2020   Gastric polyp    GAD (generalized anxiety disorder) 07/19/2019   Social anxiety disorder 07/19/2019   Panic disorder 07/19/2019   Bipolar 2 disorder ([place]01/16/2021   Allergic rhinitis 08/19/2018   GERD (gastroesophageal reflux disease) 08/19/2018   Asthma 11/29/2017   Breast mass, right 03/18/2014   Numbness and tingling in both hands 01/26/2014    History obtained from: chart review and patient.  Nicole Rodriguez a [age].o. female presenting for a follow up visit.  She was last seen in June 2023.  At that time, she was continued on AirDuo 1 puff twice daily as well as Fasenra every 8 weeks.  For her rhinitis, she was continued on Xyzal as well as Singulair and Flonase.  She was also continued on Astelin.  Since last visit, she has largely done well.  Asthma/Respiratory Symptom History: She is currently on generic AirDuo 113/1470mone puff twice daily. Apparently there is a generic and it worked. She is paying $47 dollars for this. She has been on Symbicort and Breo in the past without luck. She was on Breo for ages around 6 m13 months so. She was on both  for six months. She has not tried any forms of Advair. She is open to trying. Her father in law gives it in the arm. She has no problems with this. She does think that this has been helpful for her symptoms. She can tell when it gets close to time to take it again.  She has not needed prednisone or been to the emergency room.  She has been having a lot of problems with physical activity.  Allergic Rhinitis Symptom History: Environmental allergies are well controlled with levocetirizine as well as  Singulair and Flonase.  She uses all of these every day.  She has Astelin to use but she does not use it very often at all.  She definitely does not like the taste of Astelin.  She has not needed antibiotics.  Otherwise, there have been no changes to her past medical history, surgical history, family history, or social history.    Review of Systems  Constitutional: Negative.  Negative for chills, fever, malaise/fatigue and weight loss.  HENT: Negative.  Negative for congestion, ear discharge and ear pain.   Eyes:  Negative for pain, discharge and redness.  Respiratory:  Positive for cough and sputum production. Negative for shortness of breath and wheezing.   Cardiovascular: Negative.  Negative for chest pain and palpitations.  Gastrointestinal:  Negative for abdominal pain, constipation, diarrhea, heartburn, nausea and vomiting.  Skin: Negative.  Negative for itching and rash.  Neurological:  Negative for dizziness and headaches.  Endo/Heme/Allergies:  Negative for environmental allergies. Does not bruise/bleed easily.       Objective:   Blood pressure (!) 128/90, pulse (!) 116, temperature 98.7 F (37.1 C), resp. rate 16, height '5\' 3"'$  (1.6 m), weight 173 lb (78.5 kg), SpO2 98 %. Body mass index is 30.65 kg/m.    Physical Exam Vitals reviewed.  Constitutional:      Appearance: She is well-developed.     Comments: Pleasant and cooperative.  HENT:     Head: Normocephalic and atraumatic.     Right Ear: Tympanic membrane, ear canal and external ear normal. No drainage, swelling or tenderness. Tympanic membrane is not injected, scarred, erythematous, retracted or bulging.     Left Ear: Tympanic membrane, ear canal and external ear normal. No drainage, swelling or tenderness. Tympanic membrane is not injected, scarred, erythematous, retracted or bulging.     Nose: No nasal deformity, septal deviation, mucosal edema or rhinorrhea.     Right Turbinates: Enlarged, swollen and pale.      Left Turbinates: Enlarged, swollen and pale.     Right Sinus: No maxillary sinus tenderness or frontal sinus tenderness.     Left Sinus: No maxillary sinus tenderness or frontal sinus tenderness.     Mouth/Throat:     Mouth: Mucous membranes are not pale and not dry.     Pharynx: Uvula midline.  Eyes:     General:        Right eye: No discharge.        Left eye: No discharge.     Conjunctiva/sclera: Conjunctivae normal.     Right eye: Right conjunctiva is not injected. No chemosis.    Left eye: Left conjunctiva is not injected. No chemosis.    Pupils: Pupils are equal, round, and reactive to light.  Cardiovascular:     Rate and Rhythm: Normal rate and regular rhythm.     Heart sounds: Normal heart sounds.  Pulmonary:     Effort: Pulmonary effort is normal. No tachypnea, accessory  muscle usage or respiratory distress.     Breath sounds: Normal breath sounds. No wheezing, rhonchi or rales.     Comments: Moving air well in all lung fields.  No increased work of breathing. Chest:     Chest wall: No tenderness.  Abdominal:     Tenderness: There is no abdominal tenderness. There is no guarding or rebound.  Lymphadenopathy:     Head:     Right side of head: No submandibular, tonsillar or occipital adenopathy.     Left side of head: No submandibular, tonsillar or occipital adenopathy.     Cervical: No cervical adenopathy.  Skin:    Coloration: Skin is not pale.     Findings: No abrasion, erythema, petechiae or rash. Rash is not papular, urticarial or vesicular.  Neurological:     Mental Status: She is alert.  Psychiatric:        Behavior: Behavior is cooperative.      Diagnostic studies:    Spirometry: results normal (FEV1: 2.60/88%, FVC: 3.03/86%, FEV1/FVC: 86%).    Spirometry consistent with normal pattern.   Allergy Studies: none        Salvatore Marvel, MD  [facility] [place] [facility] [place]

## 2022-04-19 NOTE — Patient Instructions (Addendum)
1. Moderate persistent asthma, uncomplicated - Lung testing looks awesome today. - We are going to try Advair 221mg one puff twice daily instead to see if this works as well as AirDuo. - We are going to start AirSupra two puffs as needed for rescue (in lieu of your albuterol). - this contains albuterol combined with an inhaled steroid.  - Daily controller medication(s): Advair 250/528m one puff twice daily - Prior to physical activity: AirSupra 2 puffs 10-15 minutes before physical activity. - Rescue medications: AirSupra two puffs every 4-6 hours as needed.  - Asthma control goals:  * Full participation in all desired activities (may need albuterol before activity) * Albuterol use two time or less a week on average (not counting use with activity) * Cough interfering with sleep two time or less a month * Oral steroids no more than once a year * No hospitalizations  2. Chronic rhinitis (grasses only) - Continue with: Xyzal (levocetirizine) '5mg'$  tablet once daily, Singulair (montelukast) '10mg'$  daily, and Flonase (fluticasone) one spray per nostril daily and Astelin (azelastine) 2 sprays per nostril 1-2 times daily as needed - You can use an extra dose of the antihistamine, if needed, for breakthrough symptoms.  - Consider nasal saline rinses 1-2 times daily to remove allergens from the nasal cavities as well as help with mucous clearance (this is especially helpful to do before the nasal sprays are given)  3. Return in about 4 months (around 08/20/2022).    Please inform usKoreaf any Emergency Department visits, hospitalizations, or changes in symptoms. Call usKoreaefore going to the ED for breathing or allergy symptoms since we might be able to fit you in for a sick visit. Feel free to contact usKoreanytime with any questions, problems, or concerns.  It was a pleasure to see you again today!  Websites that have reliable patient information: 1. American Academy of Asthma, Allergy, and Immunology:  www.aaaai.org 2. Food Allergy Research and Education (FARE): foodallergy.org 3. Mothers of Asthmatics: http://www.asthmacommunitynetwork.org 4. American College of Allergy, Asthma, and Immunology: www.acaai.org   COVID-19 Vaccine Information can be found at: [email or web address] questions related to vaccine distribution or appointments, please email [email or web address] or call [phone]  We realize that you might be concerned about having an allergic reaction to the COVID19 vaccines. To help with that concern, WE ARE OFFERING THE COVID19 VACCINES IN OUR OFFICE! Ask the front desk for dates!     "Like" usKorean Facebook and Instagram for our latest updates!      A healthy democracy works best when ALNew York Life Insurancearticipate! Make sure you are registered to vote! If you have moved or changed any of your contact information, you will need to get this updated before voting!  In some cases, you MAY be able to register to vote online: [email or web address]

## 2022-04-19 NOTE — Addendum Note (Signed)
Addended by: Valentina Shaggy on: 04/19/2022 04:39 PM   Modules accepted: Orders

## 2022-05-09 ENCOUNTER — Other Ambulatory Visit: Payer: Self-pay | Admitting: *Deleted

## 2022-05-09 ENCOUNTER — Encounter: Payer: Self-pay | Admitting: Allergy & Immunology

## 2022-05-09 MED ORDER — FLUTICASONE PROPIONATE 50 MCG/ACT NA SUSP
1.0000 | Freq: Every day | NASAL | 3 refills | Status: DC
Start: 2022-05-09 — End: 2022-05-29

## 2022-05-09 MED ORDER — LEVOCETIRIZINE DIHYDROCHLORIDE 5 MG PO TABS: 5.0000 mg | ORAL_TABLET | Freq: Every evening | ORAL | 3 refills | Status: DC

## 2022-05-24 ENCOUNTER — Ambulatory Visit: Payer: BLUE CROSS/BLUE SHIELD | Admitting: Internal Medicine

## 2022-05-28 ENCOUNTER — Encounter: Payer: Self-pay | Admitting: Allergy & Immunology

## 2022-05-29 ENCOUNTER — Other Ambulatory Visit: Payer: Self-pay

## 2022-05-29 ENCOUNTER — Ambulatory Visit: Payer: BLUE CROSS/BLUE SHIELD | Admitting: Internal Medicine

## 2022-05-29 ENCOUNTER — Encounter: Payer: Self-pay | Admitting: Internal Medicine

## 2022-05-29 DIAGNOSIS — J069 Acute upper respiratory infection, unspecified: Secondary | ICD-10-CM

## 2022-05-29 DIAGNOSIS — J302 Other seasonal allergic rhinitis: Secondary | ICD-10-CM | POA: Diagnosis not present

## 2022-05-29 MED ORDER — AZELASTINE HCL 137 MCG/SPRAY NA SOLN: 2.0000 | Freq: Two times a day (BID) | NASAL | 3 refills | Status: DC | PRN

## 2022-05-29 MED ORDER — MONTELUKAST SODIUM 10 MG PO TABS: 10.0000 mg | ORAL_TABLET | Freq: Every day | ORAL | 1 refills | Status: DC

## 2022-05-29 MED ORDER — LEVOCETIRIZINE DIHYDROCHLORIDE 5 MG PO TABS: 5.0000 mg | ORAL_TABLET | Freq: Every evening | ORAL | 3 refills | Status: DC

## 2022-05-29 MED ORDER — FLUTICASONE PROPIONATE 50 MCG/ACT NA SUSP: 2.0000 | Freq: Every day | NASAL | 3 refills | Status: DC

## 2022-05-29 NOTE — Patient Instructions (Addendum)
Viral URI - Likely viral URI. Discussed symptomatic care.  - Use humidification, rest and drink lots of fluid.  - Can use numbing OTC throat spray or throat lozenges with benzocaine prior to eating for sore throat. Also gargle with salt water several times throughout the day.  - Can use tylenol/ibuprofen as directed for pain.   - Use nasal saline rinses before nose sprays such as with Neilmed Sinus Rinse.  Use distilled water.   - Use Flonase 1 sprays each nostril twice daily or 2 sprays each nostril daily. Aim upward and outward. - Use Azelastine 1-2 sprays each nostril twice daily. Aim upward and outward. - Use Xyzal 5 mg daily.  - Use Singulair '10mg'$  daily.

## 2022-05-29 NOTE — Progress Notes (Signed)
RE: Nicole Rodriguez MRN: [id number] DOB: 12/24/1990 Date of Telemedicine Visit: 05/29/2022  Referring provider: Lindell Spar, MD Primary care provider: Lindell Spar, MD  Chief Complaint: Sore Throat, Nasal Congestion, and Ear Pain   Telemedicine Follow Up Visit via Telephone: I connected with Nicole Rodriguez for a follow up on 05/29/22 by telephone and verified that I am speaking with the correct person using two identifiers.   I discussed the limitations, risks, security and privacy concerns of performing an evaluation and management service by telephone and the availability of in person appointments. I also discussed with the patient that there may be a patient responsible charge related to this service. The patient expressed understanding and agreed to proceed.  Patient is at home accompanied by herself who provided/contributed to the history.  Provider is at the office.  Visit start time: 10:35 AM Visit end time: 10:50 AM Insurance consent/check in by: front desk Medical consent and medical assistant/nurse: Caryl Pina  History of Present Illness:  She is a 31 y.o. female, who is being followed for moderate persistent asthma and allergic rhinitis. Her previous allergy office visit was in 04/2022 with  Dr. Ernst Bowler .   She is here today for an acute visit.  Reports about 3 days ago, she developed a sore throat, left sided ear pain, stuffy nose and post nasal drainage.  No high fevers, no purulent drainage.  She has tried OTC tylenol severe sinus, cold sinus meds, theraflu and mucinex without much relief.  She tested for COVID at home and was negative.  She has not been able to eat much due to her sore throat.  For her allergies, she is taking Singulair, Xyzal, Flonase 1 SEN BID and Azelastine PRN.  She reports no issues with her asthma in terms of SOB/wheezing/coughing with this illness, ACT score today is 24.    Otherwise, there have been no changes to her past medical history,  surgical history, family history, or social history.  Assessment and Plan:  [name] is a 31 y.o. female with:  Viral URI  Other seasonal allergic rhinitis  Viral URI Allergic Rhinitis - Likely viral URI. Discussed symptomatic care.  - Use humidification, rest and drink lots of fluid.  - Can use numbing OTC throat spray or throat lozenges with benzocaine prior to eating for sore throat. Also gargle with salt water several times throughout the day.  - Can use tylenol/ibuprofen as directed for pain.   - Use nasal saline rinses before nose sprays such as with Neilmed Sinus Rinse.  Use distilled water.   - Use Flonase 1 sprays each nostril twice daily or 2 sprays each nostril daily. Aim upward and outward. - Use Azelastine 1-2 sprays each nostril twice daily. Aim upward and outward. - Use Xyzal 5 mg daily.  - Use Singulair '10mg'$  daily.   Diagnostics: None.  Medication List:  Current Outpatient Medications  Medication Sig Dispense Refill   albuterol (PROVENTIL) (2.5 MG/3ML) 0.083% nebulizer solution Take 3 mLs (2.5 mg total) by nebulization every 6 (six) hours as needed for wheezing or shortness of breath. 75 mL 3   albuterol (VENTOLIN HFA) 108 (90 Base) MCG/ACT inhaler Inhale 2 puffs into the lungs every 6 (six) hours as needed for wheezing or shortness of breath. 8 g 5   Azelastine HCl 137 MCG/SPRAY SOLN Place 2 sprays into both nostrils 2 (two) times daily as needed. 2 SPRAYS PER NOSTRIL 1-2 TIMES DAILY AS NEEDED. 30 mL 3   buPROPion (WELLBUTRIN SR)  200 MG 12 hr tablet 1 bid 60 tablet 3   clonazePAM (KLONOPIN) 1 MG tablet 1  qhs 30 tablet 4   famotidine (PEPCID) 40 MG tablet TAKE 1 TABLET BY MOUTH TWICE A DAY 180 tablet 1   FASENRA PEN 30 MG/ML SOAJ Inject 30 mg into the skin every 28 (twenty-eight) days. Benralizumab. For severe eosinophilic asthma     fluticasone (FLONASE) 50 MCG/ACT nasal spray Place 1 spray into both nostrils daily. 16 g 3   levocetirizine (XYZAL) 5 MG tablet  Take 1 tablet (5 mg total) by mouth every evening. 30 tablet 3   montelukast (SINGULAIR) 10 MG tablet Take 1 tablet (10 mg total) by mouth at bedtime. 90 tablet 1   pantoprazole (PROTONIX) 40 MG tablet TAKE 1 TABLET (40 MG TOTAL) BY MOUTH TWICE A DAY BEFORE MEALS 180 tablet 1   traZODone (DESYREL) 50 MG tablet Take 1 tablet by mouth as needed.     fluticasone-salmeterol (ADVAIR DISKUS) 250-50 MCG/ACT AEPB Inhale 1 puff into the lungs in the morning and at bedtime. 60 each 5   No current facility-administered medications for this visit.   Allergies: Allergies  Allergen Reactions   Amoxicillin Hives   Asa [Aspirin] Hives   Cefoxitin Itching and Rash   Sulfa Antibiotics Hives   I reviewed her past medical history, social history, family history, and environmental history and no significant changes have been reported from previous visits.  Review of Systems  Objective:  Physical exam not obtained as encounter was done via telephone.   Previous notes and tests were reviewed.  I discussed the assessment and treatment plan with the patient. The patient was provided an opportunity to ask questions and all were answered. The patient agreed with the plan and demonstrated an understanding of the instructions.   The patient was advised to call back or seek an in-person evaluation if the symptoms worsen or if the condition fails to improve as anticipated.  I provided 15 minutes of non-face-to-face time during this encounter.  Harlon Flor, MD [facility]

## 2022-06-06 ENCOUNTER — Ambulatory Visit (HOSPITAL_BASED_OUTPATIENT_CLINIC_OR_DEPARTMENT_OTHER): Payer: BLUE CROSS/BLUE SHIELD | Admitting: Psychiatry

## 2022-06-06 DIAGNOSIS — F325 Major depressive disorder, single episode, in full remission: Secondary | ICD-10-CM

## 2022-06-06 MED ORDER — BUPROPION HCL ER (SR) 200 MG PO TB12: ORAL_TABLET | ORAL | 3 refills | Status: DC

## 2022-06-06 MED ORDER — CLONAZEPAM 1 MG PO TABS: ORAL_TABLET | ORAL | 4 refills | Status: DC

## 2022-06-06 NOTE — Progress Notes (Signed)
[facility] MD/PA/NP OP Progress Note  06/06/2022 3:23 PM Nicole Rodriguez  MRN:  [id number] Interview was conducted by phone and I verified that I was speaking with the correct person using two identifiers. I discussed the limitations of evaluation and management by telemedicine and  the availability of in person appointments. Patient expressed understanding and agreed to proceed. Participants in the visit: patient (location - home); physician (location - home office).  Chief Complaint: Panic attacks.  Nicole Rodriguez.  Visit Diagnosis:   Today the patient is doing very well.  She changed her diet and exercise for most of her 20 pounds.  She still looking forward to getting a promotion at [employer].  The patient's daughter Nicole Rodriguez is 37 years old and doing very well.  The patient has an excellent relationship with her ex-partner who shares in parenting Nicole Rodriguez.  The patient is actually starting today.  She met someone and is going out now.  The patient's mood is good.  She is sleeping and eating well.  She got very good energy.  She loves to walk.  After biggest exercise.  She takes her medicines just as prescribed.  Her health is great.  Financially she is doing well.  The patient is positive and optimistic.  She drinks no alcohol and uses no drugs.  She takes her medicines just as prescribed.  She is functioning at a high level.  She is looking forward to a promotion in her work setting.  I connected with Nicole Rodriguez on 06/06/22 at  3:00 PM EST by telephone and verified that I am speaking with the correct person using two identifiers.  Location: Patient: home Provider: office   I discussed the limitations, risks, security and privacy concerns of performing an evaluation and management service by telephone and the availability of in person appointments. I also discussed with the patient that there may be a patient responsible charge related to this service. The patient expressed understanding and agreed to  proceed.      I discussed the assessment and treatment plan with the patient. The patient was provided an opportunity to ask questions and all were answered. The patient agreed with the plan and demonstrated an understanding of the instructions.   The patient was advised to call back or seek an in-person evaluation if the symptoms worsen or if the condition fails to improve as anticipated.  I provided 30 minutes of non-face-to-face time during this encounter.   Nicole Ralph, MD  No diagnosis found.  Past Psychiatric History: Please see intake H&P.  Past Medical History:  Past Medical History:  Diagnosis Date   Asthma    Bipolar disorder ([place])    Generalized anxiety disorder    GERD (gastroesophageal reflux disease)    Phreesia 08/17/2020   IBS (irritable bowel syndrome)    Plantar fasciitis 10/15/2018   Rectal bleeding 09/24/2019   Social anxiety disorder     Past Surgical History:  Procedure Laterality Date   APPENDECTOMY     BREAST LUMPECTOMY     COLONOSCOPY WITH PROPOFOL N/A 12/03/2019   Procedure: COLONOSCOPY WITH PROPOFOL;  Surgeon: Virgel Manifold, MD;  Location: [facility] ENDOSCOPY;  Service: Endoscopy;  Laterality: N/A;   CYSTOSCOPY W/ URETERAL STENT PLACEMENT Right 08/31/2021   Procedure: CYSTOSCOPY WITH BILATERAL RETROGRADE PYELOGRAM;  Surgeon: Primus Bravo., MD;  Location: [facility];  Service: Urology;  Laterality: Right;   ESOPHAGOGASTRODUODENOSCOPY (EGD) WITH PROPOFOL N/A 12/03/2019   Procedure: ESOPHAGOGASTRODUODENOSCOPY (EGD) WITH PROPOFOL;  Surgeon: Virgel Manifold,  MD;  Location: [facility] ENDOSCOPY;  Service: Endoscopy;  Laterality: N/A;   FOOT SURGERY     URETEROSCOPY Right 08/31/2021   Procedure: URETEROSCOPY;  Surgeon: Primus Bravo., MD;  Location: [facility];  Service: Urology;  Laterality: Right;    Family Psychiatric History: Reviewed.  Family History:  Family History  Problem Relation Age of Onset   Allergic rhinitis Father    Asthma  Father    Crohn's disease Sister        half sister   Anxiety disorder Maternal Grandmother    Depression Maternal Grandmother    Diverticulitis Paternal Grandmother    Diabetes Other    Heart failure Other    Colon cancer Neg Hx    Celiac disease Neg Hx     Social History:  Social History   Socioeconomic History   Marital status: Legally Separated    Spouse name: Not on file   Number of children: 1   Years of education: Not on file   Highest education level: Not on file  Occupational History   Not on file  Tobacco Use   Smoking status: Never    Passive exposure: Never   Smokeless tobacco: Never  Vaping Use   Vaping Use: Never used  Substance and Sexual Activity   Alcohol use: Yes    Comment: socially    Drug use: No   Sexual activity: Yes    Birth control/protection: None  Other Topics Concern   Not on file  Social History Narrative   Daughter 45 years old.   Social Determinants of Health   Financial Resource Strain: Not on file  Food Insecurity: Not on file  Transportation Needs: Not on file  Physical Activity: Not on file  Stress: Not on file  Social Connections: Not on file    Allergies:  Allergies  Allergen Reactions   Amoxicillin Hives   Asa [Aspirin] Hives   Cefoxitin Itching and Rash   Sulfa Antibiotics Hives    Metabolic Disorder Labs: Lab Results  Component Value Date   HGBA1C 5.2 02/01/2017   No results found for: "PROLACTIN" Lab Results  Component Value Date   CHOL 159 02/01/2017   TRIG 85 02/01/2017   HDL 44 02/01/2017   CHOLHDL 3.6 02/01/2017   LDLCALC 98 02/01/2017   LDLCALC 112 (H) 09/14/2016   Lab Results  Component Value Date   TSH 1.500 09/14/2016    Therapeutic Level Labs: No results found for: "LITHIUM" No results found for: "VALPROATE" No results found for: "CBMZ"  Current Medications: Current Outpatient Medications  Medication Sig Dispense Refill   albuterol (PROVENTIL) (2.5 MG/3ML) 0.083% nebulizer solution  Take 3 mLs (2.5 mg total) by nebulization every 6 (six) hours as needed for wheezing or shortness of breath. 75 mL 3   albuterol (VENTOLIN HFA) 108 (90 Base) MCG/ACT inhaler Inhale 2 puffs into the lungs every 6 (six) hours as needed for wheezing or shortness of breath. 8 g 5   Azelastine HCl 137 MCG/SPRAY SOLN Place 2 sprays into both nostrils 2 (two) times daily as needed. 30 mL 3   buPROPion (WELLBUTRIN SR) 200 MG 12 hr tablet 1 bid 60 tablet 3   clonazePAM (KLONOPIN) 1 MG tablet 2 qhs 60 tablet 4   famotidine (PEPCID) 40 MG tablet TAKE 1 TABLET BY MOUTH TWICE A DAY 180 tablet 1   FASENRA PEN 30 MG/ML SOAJ Inject 30 mg into the skin every 28 (twenty-eight) days. Benralizumab. For severe eosinophilic asthma  fluticasone (FLONASE) 50 MCG/ACT nasal spray Place 2 sprays into both nostrils daily. 16 g 3   fluticasone-salmeterol (ADVAIR DISKUS) 250-50 MCG/ACT AEPB Inhale 1 puff into the lungs in the morning and at bedtime. 60 each 5   levocetirizine (XYZAL) 5 MG tablet Take 1 tablet (5 mg total) by mouth every evening. 30 tablet 3   montelukast (SINGULAIR) 10 MG tablet Take 1 tablet (10 mg total) by mouth at bedtime. 90 tablet 1   pantoprazole (PROTONIX) 40 MG tablet TAKE 1 TABLET (40 MG TOTAL) BY MOUTH TWICE A DAY BEFORE MEALS 180 tablet 1   No current facility-administered medications for this visit.      Psychiatric Specialty Exam: Review of Systems  Psychiatric/Behavioral:  The patient is nervous/anxious.   All other systems reviewed and are negative.   There were no vitals taken for this visit.There is no height or weight on file to calculate BMI.  General Appearance: NA  Eye Contact:  NA  Speech:  Clear and Coherent and Normal Rate  Volume:  Normal  Mood:  Anxious  Affect:  NA  Thought Process:  Goal Directed  Orientation:  Full (Time, Place, and Person)  Thought Content: Rumination   Suicidal Thoughts:  No  Homicidal Thoughts:  No  Memory:  Immediate;   Good Recent;    Good Remote;   Good  Judgement:  Good  Insight:  Fair  Psychomotor Activity:  NA  Concentration:  Concentration: Fair  Recall:  Good  Fund of Knowledge: Good  Language: Good  Akathisia:  Negative  Handed:  Right  AIMS (if indicated): not done  Assets:  Communication Skills Desire for Improvement Financial Resources/Insurance Housing Social Support Talents/Skills  ADL's:  Intact  Cognition: WNL  Sleep:  Good   Screenings: GAD-7    Flowsheet Row Office Visit from 06/18/2019 in [place] Office Visit from 06/05/2019 in [place] [facility] Office Visit from 04/01/2019 in [facility]  Total GAD-7 Score 20 19 0      PHQ2-9    [place] Office Visit from 03/08/2022 in [facility] Office Visit from 06/30/2021 in [facility] Video Visit from 11/15/2020 in [facility] Office Visit from 08/20/2020 in [facility] Office Visit from 06/18/2019 in [facility]  PHQ-2 Total Score 0 0 0 0 5  PHQ-9 Total Score 0 -- -- 0 [place] ED from 03/07/2022 in [facility] ED from 10/10/2021 in [facility] Admission (Discharged) from 08/31/2021 in [facility] No Risk No Risk No Risk        Assessment and Plan:      This patient's diagnosis is bipolar type II.  We will reevaluate this diagnosis.  At this time she just takes Wellbutrin 200 mg slow release 1 a day.  She takes Klonopin 1 mg at night.  So she does have insomnia but I suspect she actually has major depression.  This patient was seen again in 5 months.  Presently she is in no therapy.  She clearly is in remission.   Dx: Bipolar 2 disorder rapid cycling; Mixed anxiety disorder (panic disorder' GAD, social anxiety)   Plan:  Continue Lamictal to 200 mg at HS, Latuda 40 mg at HS, paroxetine 20 mg in PM, trazodone 50 mg prn sleep and bupropion SR 200 mg bid  for fatigue. I will stop lorazepam 2  mg prn anxiety (up to tid) and start clonazepam 1 mg bid scheduled initially (then advised to start using it prn after anxiety decreases). We shall write her an excuse from work note for 3/26-4/18/22 period.  Next appointment with a new provider in 1 month. The plan was discussed with patient who had an opportunity to ask questions and these were all answered. I spend 15 minutes in phone consultation with the patient.    Nicole Ralph, MD 06/06/2022, 3:23 PM

## 2022-06-15 ENCOUNTER — Encounter: Payer: Self-pay | Admitting: *Deleted

## 2022-06-23 ENCOUNTER — Telehealth: Payer: BLUE CROSS/BLUE SHIELD | Admitting: Physician Assistant

## 2022-06-23 DIAGNOSIS — B9689 Other specified bacterial agents as the cause of diseases classified elsewhere: Secondary | ICD-10-CM | POA: Diagnosis not present

## 2022-06-23 DIAGNOSIS — J019 Acute sinusitis, unspecified: Secondary | ICD-10-CM | POA: Diagnosis not present

## 2022-06-23 DIAGNOSIS — R051 Acute cough: Secondary | ICD-10-CM | POA: Diagnosis not present

## 2022-06-23 MED ORDER — PROMETHAZINE-DM 6.25-15 MG/5ML PO SYRP: 5.0000 mL | ORAL_SOLUTION | Freq: Four times a day (QID) | ORAL | 0 refills | Status: DC | PRN

## 2022-06-23 MED ORDER — DOXYCYCLINE HYCLATE 100 MG PO TABS: 100.0000 mg | ORAL_TABLET | Freq: Two times a day (BID) | ORAL | 0 refills | Status: DC

## 2022-06-23 MED ORDER — BENZONATATE 100 MG PO CAPS: 100.0000 mg | ORAL_CAPSULE | Freq: Three times a day (TID) | ORAL | 0 refills | Status: DC | PRN

## 2022-06-23 NOTE — Progress Notes (Signed)
Virtual Visit Consent   [name], you are scheduled for a virtual visit with a [facility] provider today. Just as with appointments in the office, your consent must be obtained to participate. Your consent will be active for this visit and any virtual visit you may have with one of our providers in the next 365 days. If you have a MyChart account, a copy of this consent can be sent to you electronically.  As this is a virtual visit, video technology does not allow for your provider to perform a traditional examination. This may limit your provider's ability to fully assess your condition. If your provider identifies any concerns that need to be evaluated in person or the need to arrange testing (such as labs, EKG, etc.), we will make arrangements to do so. Although advances in technology are sophisticated, we cannot ensure that it will always work on either your end or our end. If the connection with a video visit is poor, the visit may have to be switched to a telephone visit. With either a video or telephone visit, we are not always able to ensure that we have a secure connection.  By engaging in this virtual visit, you consent to the provision of healthcare and authorize for your insurance to be billed (if applicable) for the services provided during this visit. Depending on your insurance coverage, you may receive a charge related to this service.  I need to obtain your verbal consent now. Are you willing to proceed with your visit today? Nicole Rodriguez has provided verbal consent on 06/23/2022 for a virtual visit (video or telephone). Mar Daring, PA-C  Date: 06/23/2022 12:50 PM  Virtual Visit via Video Note   I, Mar Daring, connected with  Nicole Rodriguez  ([id number], 10-Dec-1990) on 06/23/22 at 12:45 PM EST by a video-enabled telemedicine application and verified that I am speaking with the correct person using two identifiers.  Location: Patient: Virtual Visit  Location Patient: Mobile Provider: Virtual Visit Location Provider: Home Office   I discussed the limitations of evaluation and management by telemedicine and the availability of in person appointments. The patient expressed understanding and agreed to proceed.    History of Present Illness: Nicole Rodriguez is a 31 y.o. who identifies as a female who was assigned female at birth, and is being seen today for URI symptoms.  HPI: URI  This is a new problem. The current episode started 1 to 4 weeks ago. The problem has been gradually worsening. There has been no fever. Associated symptoms include congestion, coughing, headaches, rhinorrhea (and post nasal drainage), sinus pain and a sore throat (initial symptom). Pertinent negatives include no diarrhea, ear pain, nausea, plugged ear sensation, vomiting or wheezing. She has tried acetaminophen (tylenol cold and head congestion) for the symptoms. The treatment provided no relief.    Problems:  Patient Active Problem List   Diagnosis Date Noted   Intermittent palpitations 08/20/2020   Gastric polyp    GAD (generalized anxiety disorder) 07/19/2019   Social anxiety disorder 07/19/2019   Panic disorder 07/19/2019   Bipolar 2 disorder ([place]) 07/19/2019   Allergic rhinitis 08/19/2018   GERD (gastroesophageal reflux disease) 08/19/2018   Asthma 11/29/2017   Breast mass, right 03/18/2014   Numbness and tingling in both hands 01/26/2014    Allergies:  Allergies  Allergen Reactions   Amoxicillin Hives   Asa [Aspirin] Hives   Cefoxitin Itching and Rash   Sulfa Antibiotics Hives   Medications:  Current Outpatient Medications:  benzonatate (TESSALON) 100 MG capsule, Take 1 capsule (100 mg total) by mouth 3 (three) times daily as needed., Disp: 30 capsule, Rfl: 0   doxycycline (VIBRA-TABS) 100 MG tablet, Take 1 tablet (100 mg total) by mouth 2 (two) times daily., Disp: 20 tablet, Rfl: 0   promethazine-dextromethorphan (PROMETHAZINE-DM) 6.25-15  MG/5ML syrup, Take 5 mLs by mouth 4 (four) times daily as needed., Disp: 118 mL, Rfl: 0   albuterol (PROVENTIL) (2.5 MG/3ML) 0.083% nebulizer solution, Take 3 mLs (2.5 mg total) by nebulization every 6 (six) hours as needed for wheezing or shortness of breath., Disp: 75 mL, Rfl: 3   albuterol (VENTOLIN HFA) 108 (90 Base) MCG/ACT inhaler, Inhale 2 puffs into the lungs every 6 (six) hours as needed for wheezing or shortness of breath., Disp: 8 g, Rfl: 5   Azelastine HCl 137 MCG/SPRAY SOLN, Place 2 sprays into both nostrils 2 (two) times daily as needed., Disp: 30 mL, Rfl: 3   buPROPion (WELLBUTRIN SR) 200 MG 12 hr tablet, 1 bid, Disp: 60 tablet, Rfl: 3   clonazePAM (KLONOPIN) 1 MG tablet, 2 qhs, Disp: 60 tablet, Rfl: 4   famotidine (PEPCID) 40 MG tablet, TAKE 1 TABLET BY MOUTH TWICE A DAY, Disp: 180 tablet, Rfl: 1   FASENRA PEN 30 MG/ML SOAJ, Inject 30 mg into the skin every 28 (twenty-eight) days. Benralizumab. For severe eosinophilic asthma, Disp: , Rfl:    fluticasone (FLONASE) 50 MCG/ACT nasal spray, Place 2 sprays into both nostrils daily., Disp: 16 g, Rfl: 3   fluticasone-salmeterol (ADVAIR DISKUS) 250-50 MCG/ACT AEPB, Inhale 1 puff into the lungs in the morning and at bedtime., Disp: 60 each, Rfl: 5   levocetirizine (XYZAL) 5 MG tablet, Take 1 tablet (5 mg total) by mouth every evening., Disp: 30 tablet, Rfl: 3   montelukast (SINGULAIR) 10 MG tablet, Take 1 tablet (10 mg total) by mouth at bedtime., Disp: 90 tablet, Rfl: 1   pantoprazole (PROTONIX) 40 MG tablet, TAKE 1 TABLET (40 MG TOTAL) BY MOUTH TWICE A DAY BEFORE MEALS, Disp: 180 tablet, Rfl: 1  Observations/Objective: Patient is well-developed, well-nourished in no acute distress.  Resting comfortably at home.  Head is normocephalic, atraumatic.  No labored breathing.  Speech is clear and coherent with logical content.  Patient is alert and oriented at baseline.    Assessment and Plan: 1. Acute bacterial sinusitis - doxycycline  (VIBRA-TABS) 100 MG tablet; Take 1 tablet (100 mg total) by mouth 2 (two) times daily.  Dispense: 20 tablet; Refill: 0  2. Acute cough - benzonatate (TESSALON) 100 MG capsule; Take 1 capsule (100 mg total) by mouth 3 (three) times daily as needed.  Dispense: 30 capsule; Refill: 0 - promethazine-dextromethorphan (PROMETHAZINE-DM) 6.25-15 MG/5ML syrup; Take 5 mLs by mouth 4 (four) times daily as needed.  Dispense: 118 mL; Refill: 0  - Worsening symptoms that have not responded to OTC medications.  - Will give Doxycycline - Promethazine DM and Tessalon perles for cough - Continue allergy medications.  - Steam and humidifier can help - Stay well hydrated and get plenty of rest.  - Seek in person evaluation if no symptom improvement or if symptoms worsen   Follow Up Instructions: I discussed the assessment and treatment plan with the patient. The patient was provided an opportunity to ask questions and all were answered. The patient agreed with the plan and demonstrated an understanding of the instructions.  A copy of instructions were sent to the patient via MyChart unless otherwise noted below.  The patient was advised to call back or seek an in-person evaluation if the symptoms worsen or if the condition fails to improve as anticipated.  Time:  I spent 8 minutes with the patient via telehealth technology discussing the above problems/concerns.    Mar Daring, PA-C

## 2022-06-23 NOTE — Patient Instructions (Signed)
Nicole Rodriguez, thank you for joining Mar Daring, PA-C for today's virtual visit.  While this provider is not your primary care provider (PCP), if your PCP is located in our provider database this encounter information will be shared with them immediately following your visit.   [facility] account gives you access to today's visit and all your visits, tests, and labs performed at [facility] " click here if you don't have a [facility] account or go to [email or web address]  Consent: (Patient) Nicole Rodriguez provided verbal consent for this virtual visit at the beginning of the encounter.  Current Medications:  Current Outpatient Medications:    benzonatate (TESSALON) 100 MG capsule, Take 1 capsule (100 mg total) by mouth 3 (three) times daily as needed., Disp: 30 capsule, Rfl: 0   doxycycline (VIBRA-TABS) 100 MG tablet, Take 1 tablet (100 mg total) by mouth 2 (two) times daily., Disp: 20 tablet, Rfl: 0   promethazine-dextromethorphan (PROMETHAZINE-DM) 6.25-15 MG/5ML syrup, Take 5 mLs by mouth 4 (four) times daily as needed., Disp: 118 mL, Rfl: 0   albuterol (PROVENTIL) (2.5 MG/3ML) 0.083% nebulizer solution, Take 3 mLs (2.5 mg total) by nebulization every 6 (six) hours as needed for wheezing or shortness of breath., Disp: 75 mL, Rfl: 3   albuterol (VENTOLIN HFA) 108 (90 Base) MCG/ACT inhaler, Inhale 2 puffs into the lungs every 6 (six) hours as needed for wheezing or shortness of breath., Disp: 8 g, Rfl: 5   Azelastine HCl 137 MCG/SPRAY SOLN, Place 2 sprays into both nostrils 2 (two) times daily as needed., Disp: 30 mL, Rfl: 3   buPROPion (WELLBUTRIN SR) 200 MG 12 hr tablet, 1 bid, Disp: 60 tablet, Rfl: 3   clonazePAM (KLONOPIN) 1 MG tablet, 2 qhs, Disp: 60 tablet, Rfl: 4   famotidine (PEPCID) 40 MG tablet, TAKE 1 TABLET BY MOUTH TWICE A DAY, Disp: 180 tablet, Rfl: 1   FASENRA PEN 30 MG/ML SOAJ, Inject 30 mg into the skin every 28 (twenty-eight)  days. Benralizumab. For severe eosinophilic asthma, Disp: , Rfl:    fluticasone (FLONASE) 50 MCG/ACT nasal spray, Place 2 sprays into both nostrils daily., Disp: 16 g, Rfl: 3   fluticasone-salmeterol (ADVAIR DISKUS) 250-50 MCG/ACT AEPB, Inhale 1 puff into the lungs in the morning and at bedtime., Disp: 60 each, Rfl: 5   levocetirizine (XYZAL) 5 MG tablet, Take 1 tablet (5 mg total) by mouth every evening., Disp: 30 tablet, Rfl: 3   montelukast (SINGULAIR) 10 MG tablet, Take 1 tablet (10 mg total) by mouth at bedtime., Disp: 90 tablet, Rfl: 1   pantoprazole (PROTONIX) 40 MG tablet, TAKE 1 TABLET (40 MG TOTAL) BY MOUTH TWICE A DAY BEFORE MEALS, Disp: 180 tablet, Rfl: 1   Medications ordered in this encounter:  Meds ordered this encounter  Medications   doxycycline (VIBRA-TABS) 100 MG tablet    Sig: Take 1 tablet (100 mg total) by mouth 2 (two) times daily.    Dispense:  20 tablet    Refill:  0    Order Specific Question:   Supervising Provider    Answer:   Chase Picket [[id number]]   benzonatate (TESSALON) 100 MG capsule    Sig: Take 1 capsule (100 mg total) by mouth 3 (three) times daily as needed.    Dispense:  30 capsule    Refill:  0    Order Specific Question:   Supervising Provider    Answer:   Chase Picket [id number]   promethazine-dextromethorphan (PROMETHAZINE-DM)  6.25-15 MG/5ML syrup    Sig: Take 5 mLs by mouth 4 (four) times daily as needed.    Dispense:  118 mL    Refill:  0    Order Specific Question:   Supervising Provider    Answer:   Chase Picket [[id number]]     *If you need refills on other medications prior to your next appointment, please contact your pharmacy*  Follow-Up: Call back or seek an in-person evaluation if the symptoms worsen or if the condition fails to improve as anticipated.  [facility] [phone]  Other Instructions Sinus Infection, Adult A sinus infection, also called sinusitis, is inflammation of your sinuses. Sinuses  are hollow spaces in the bones around your face. Your sinuses are located: Around your eyes. In the middle of your forehead. Behind your nose. In your cheekbones. Mucus normally drains out of your sinuses. When your nasal tissues become inflamed or swollen, mucus can become trapped or blocked. This allows bacteria, viruses, and fungi to grow, which leads to infection. Most infections of the sinuses are caused by a virus. A sinus infection can develop quickly. It can last for up to 4 weeks (acute) or for more than 12 weeks (chronic). A sinus infection often develops after a cold. What are the causes? This condition is caused by anything that creates swelling in the sinuses or stops mucus from draining. This includes: Allergies. Asthma. Infection from bacteria or viruses. Deformities or blockages in your nose or sinuses. Abnormal growths in the nose (nasal polyps). Pollutants, such as chemicals or irritants in the air. Infection from fungi. This is rare. What increases the risk? You are more likely to develop this condition if you: Have a weak body defense system (immune system). Do a lot of swimming or diving. Overuse nasal sprays. Smoke. What are the signs or symptoms? The main symptoms of this condition are pain and a feeling of pressure around the affected sinuses. Other symptoms include: Stuffy nose or congestion that makes it difficult to breathe through your nose. Thick yellow or greenish drainage from your nose. Tenderness, swelling, and warmth over the affected sinuses. A cough that may get worse at night. Decreased sense of smell and taste. Extra mucus that collects in the throat or the back of the nose (postnasal drip) causing a sore throat or bad breath. Tiredness (fatigue). Fever. How is this diagnosed? This condition is diagnosed based on: Your symptoms. Your medical history. A physical exam. Tests to find out if your condition is acute or chronic. This may  include: Checking your nose for nasal polyps. Viewing your sinuses using a device that has a light (endoscope). Testing for allergies or bacteria. Imaging tests, such as an MRI or CT scan. In rare cases, a bone biopsy may be done to rule out more serious types of fungal sinus disease. How is this treated? Treatment for a sinus infection depends on the cause and whether your condition is chronic or acute. If caused by a virus, your symptoms should go away on their own within 10 days. You may be given medicines to relieve symptoms. They include: Medicines that shrink swollen nasal passages (decongestants). A spray that eases inflammation of the nostrils (topical intranasal corticosteroids). Rinses that help get rid of thick mucus in your nose (nasal saline washes). Medicines that treat allergies (antihistamines). Over-the-counter pain relievers. If caused by bacteria, your health care provider may recommend waiting to see if your symptoms improve. Most bacterial infections will get better  without antibiotic medicine. You may be given antibiotics if you have: A severe infection. A weak immune system. If caused by narrow nasal passages or nasal polyps, surgery may be needed. Follow these instructions at home: Medicines Take, use, or apply over-the-counter and prescription medicines only as told by your health care provider. These may include nasal sprays. If you were prescribed an antibiotic medicine, take it as told by your health care provider. Do not stop taking the antibiotic even if you start to feel better. Hydrate and humidify  Drink enough fluid to keep your urine pale yellow. Staying hydrated will help to thin your mucus. Use a cool mist humidifier to keep the humidity level in your home above 50%. Inhale steam for 10-15 minutes, 3-4 times a day, or as told by your health care provider. You can do this in the bathroom while a hot shower is running. Limit your exposure to cool or dry  air. Rest Rest as much as possible. Sleep with your head raised (elevated). Make sure you get enough sleep each night. General instructions  Apply a warm, moist washcloth to your face 3-4 times a day or as told by your health care provider. This will help with discomfort. Use nasal saline washes as often as told by your health care provider. Wash your hands often with soap and water to reduce your exposure to germs. If soap and water are not available, use hand sanitizer. Do not smoke. Avoid being around people who are smoking (secondhand smoke). Keep all follow-up visits. This is important. Contact a health care provider if: You have a fever. Your symptoms get worse. Your symptoms do not improve within 10 days. Get help right away if: You have a severe headache. You have persistent vomiting. You have severe pain or swelling around your face or eyes. You have vision problems. You develop confusion. Your neck is stiff. You have trouble breathing. These symptoms may be an emergency. Get help right away. Call 911. Do not wait to see if the symptoms will go away. Do not drive yourself to the hospital. Summary A sinus infection is soreness and inflammation of your sinuses. Sinuses are hollow spaces in the bones around your face. This condition is caused by nasal tissues that become inflamed or swollen. The swelling traps or blocks the flow of mucus. This allows bacteria, viruses, and fungi to grow, which leads to infection. If you were prescribed an antibiotic medicine, take it as told by your health care provider. Do not stop taking the antibiotic even if you start to feel better. Keep all follow-up visits. This is important. This information is not intended to replace advice given to you by your health care provider. Make sure you discuss any questions you have with your health care provider. Document Revised: 05/24/2021 Document Reviewed: 05/24/2021 Elsevier Patient Education  [employer].    If you have been instructed to have an in-person evaluation today at a local Urgent Care facility, please use the link below. It will take you to a list of all of our available [facility], including address, phone number and hours of operation. Please do not delay care.  [facility]  If you or a family member do not have a primary care provider, use the link below to schedule a visit and establish care. When you choose a [facility] primary care physician or advanced practice provider, you gain a long-term partner in health. Find a Primary Care Provider  Learn more about [facility] in-office and virtual care options: [facility]

## 2022-07-08 ENCOUNTER — Other Ambulatory Visit: Payer: Self-pay | Admitting: Family

## 2022-07-10 ENCOUNTER — Emergency Department (HOSPITAL_COMMUNITY)
Admission: EM | Admit: 2022-07-10 | Discharge: 2022-07-10 | Disposition: A | Discharge status: Home / Self Care | Payer: BLUE CROSS/BLUE SHIELD | Attending: Student | Admitting: Student

## 2022-07-10 ENCOUNTER — Encounter: Payer: Self-pay | Admitting: Allergy & Immunology

## 2022-07-10 ENCOUNTER — Encounter (HOSPITAL_COMMUNITY): Payer: Self-pay | Admitting: *Deleted

## 2022-07-10 ENCOUNTER — Emergency Department (HOSPITAL_COMMUNITY): Payer: BLUE CROSS/BLUE SHIELD

## 2022-07-10 ENCOUNTER — Ambulatory Visit: Payer: BLUE CROSS/BLUE SHIELD | Admitting: Internal Medicine

## 2022-07-10 ENCOUNTER — Encounter (INDEPENDENT_AMBULATORY_CARE_PROVIDER_SITE_OTHER): Payer: Self-pay

## 2022-07-10 ENCOUNTER — Other Ambulatory Visit: Payer: Self-pay

## 2022-07-10 DIAGNOSIS — M542 Cervicalgia: Secondary | ICD-10-CM | POA: Diagnosis present

## 2022-07-10 DIAGNOSIS — M50122 Cervical disc disorder at C5-C6 level with radiculopathy: Secondary | ICD-10-CM | POA: Diagnosis not present

## 2022-07-10 MED ORDER — METHYLPREDNISOLONE 4 MG PO TBPK: ORAL_TABLET | ORAL | 0 refills | Status: DC

## 2022-07-10 MED ORDER — DEXAMETHASONE SODIUM PHOSPHATE 10 MG/ML IJ SOLN
10.0000 mg | Freq: Once | INTRAMUSCULAR | Status: AC
  Administered 2022-07-10: 10 mg via INTRAMUSCULAR
  Filled 2022-07-10: qty 1

## 2022-07-10 MED ORDER — MELOXICAM 15 MG PO TABS: 15.0000 mg | ORAL_TABLET | Freq: Every day | ORAL | 0 refills | Status: AC

## 2022-07-10 NOTE — Discharge Instructions (Addendum)
You were evaluated today for left sided shoulder pain and neck pain. Your CT results included: 1. Cervical spondylosis, as outlined and with findings most notably  as follows.  2. At C5-C6, a disc bulge and superimposed central disc protrusion  contribute to apparent moderate spinal canal stenosis.  3. At C3-C4 and C4-C5, uncovertebral hypertrophy results in mild  left-sided bony neural foraminal narrowing.  4. Nonspecific reversal of the expected cervical lordosis.  5. Dextrocurvature of the cervical spine  6. C2-C3 grade 1 anterolisthesis.   It does appear as you are having symptoms due to a bulging disc and due to some chronic cervical spine issues. I have attached contact information for neurosurgery. Please contact them and schedule an appointment for follow up.  I have prescribed a steroid dose pack and meloxicam. Meloxicam is an antiinflammatory. Do not take other NSAID medications while taking the meloxicam. If you experience any side effects such as stomach upset you may discontinue the meloxicam.

## 2022-07-10 NOTE — ED Provider Notes (Signed)
[facility] EMERGENCY DEPARTMENT Provider Note   CSN: [id number] Arrival date & time: 07/10/22  4627     History  Chief Complaint  Patient presents with   Neck Pain    Nicole Rodriguez is a 32 y.o. female.  Patient presents with a chief complaint of neck pain with left-sided shoulder pain and numbness radiating down the left arm.  Patient states this began while she was lifting firewood over the weekend.  She does endorse possible previous bulging disc in her neck.  She denies taking any medication that has provided any relief at home.  Past medical history is significant for asthma, bipolar disorder, generalized anxiety disorder, IBS, GERD  HPI     Home Medications Prior to Admission medications   Medication Sig Start Date End Date Taking? Authorizing Provider  meloxicam (MOBIC) 15 MG tablet Take 1 tablet (15 mg total) by mouth daily. 07/10/22 08/09/22 Yes Dorothyann Peng, PA-C  methylPREDNISolone (MEDROL DOSEPAK) 4 MG TBPK tablet Take as directed per package instructions 07/10/22  Yes Cherlynn June B, PA-C  albuterol (PROVENTIL) (2.5 MG/3ML) 0.083% nebulizer solution Take 3 mLs (2.5 mg total) by nebulization every 6 (six) hours as needed for wheezing or shortness of breath. 02/11/20   Malachy Mood, MD  albuterol (VENTOLIN HFA) 108 (90 Base) MCG/ACT inhaler Inhale 2 puffs into the lungs every 6 (six) hours as needed for wheezing or shortness of breath. 06/30/21   Lindell Spar, MD  Azelastine HCl 137 MCG/SPRAY SOLN Place 2 sprays into both nostrils 2 (two) times daily as needed. 05/29/22   Larose Kells, MD  benzonatate (TESSALON) 100 MG capsule Take 1 capsule (100 mg total) by mouth 3 (three) times daily as needed. 06/23/22   Mar Daring, PA-C  buPROPion [facility] SR) 200 MG 12 hr tablet 1 bid 06/06/22   Plovsky, Berneta Sages, MD  clonazePAM [facility] [place]) 1 MG tablet 2 qhs 06/06/22   Plovsky, Berneta Sages, MD  doxycycline (VIBRA-TABS) 100 MG tablet Take 1 tablet (100 mg total) by mouth  2 (two) times daily. 06/23/22   Mar Daring, PA-C  famotidine (PEPCID) 40 MG tablet TAKE 1 TABLET BY MOUTH TWICE A DAY 04/12/22   Althea Charon, FNP  FASENRA PEN 30 MG/ML SOAJ Inject 30 mg into the skin every 28 (twenty-eight) days. Benralizumab. For severe eosinophilic asthma 0/35/00   [provider]  fluticasone (FLONASE) 50 MCG/ACT nasal spray Place 2 sprays into both nostrils daily. 05/29/22   Larose Kells, MD  fluticasone-salmeterol (ADVAIR DISKUS) 250-50 MCG/ACT AEPB Inhale 1 puff into the lungs in the morning and at bedtime. 04/19/22 05/19/22  Valentina Shaggy, MD  levocetirizine (XYZAL) 5 MG tablet TAKE 1 TABLET BY MOUTH EVERY DAY IN THE EVENING 07/10/22   Althea Charon, FNP  montelukast (SINGULAIR) 10 MG tablet Take 1 tablet (10 mg total) by mouth at bedtime. 05/29/22   Larose Kells, MD  pantoprazole (PROTONIX) 40 MG tablet TAKE 1 TABLET (40 MG TOTAL) BY MOUTH TWICE A DAY BEFORE MEALS 03/27/22   Lindell Spar, MD  promethazine-dextromethorphan (PROMETHAZINE-DM) 6.25-15 MG/5ML syrup Take 5 mLs by mouth 4 (four) times daily as needed. 06/23/22   Mar Daring, PA-C      Allergies    Amoxicillin, Asa [aspirin], Cefoxitin, and Sulfa antibiotics    Review of Systems   Review of Systems  Musculoskeletal:  Positive for arthralgias and neck pain.    Physical Exam Updated Vital Signs BP (!) 136/99   Pulse 94  Temp 98.2 F (36.8 C) (Oral)   Resp 18   Ht '5\' 2"'$  (1.575 m)   Wt 69.9 kg   LMP 07/03/2022   SpO2 99%   BMI 28.17 kg/m  Physical Exam Vitals and nursing note reviewed.  Constitutional:      General: She is not in acute distress.    Appearance: She is well-developed.  HENT:     Head: Normocephalic and atraumatic.  Eyes:     Conjunctiva/sclera: Conjunctivae normal.  Cardiovascular:     Rate and Rhythm: Normal rate and regular rhythm.     Heart sounds: No murmur heard. Pulmonary:     Effort: Pulmonary effort is normal. No  respiratory distress.     Breath sounds: Normal breath sounds.  Abdominal:     Palpations: Abdomen is soft.     Tenderness: There is no abdominal tenderness.  Musculoskeletal:        General: No swelling or tenderness.     Cervical back: Neck supple.     Comments: Patient complains of pain with abduction of the left shoulder, also felt in neck  Skin:    General: Skin is warm and dry.     Capillary Refill: Capillary refill takes less than 2 seconds.  Neurological:     General: No focal deficit present.     Mental Status: She is alert.     Sensory: No sensory deficit.     Motor: No weakness.  Psychiatric:        Mood and Affect: Mood normal.     ED Results / Procedures / Treatments   Labs (all labs ordered are listed, but only abnormal results are displayed) Labs Reviewed - No data to display  EKG None  Radiology CT Cervical Spine Wo Contrast  Result Date: 07/10/2022 CLINICAL DATA:  Provided history: Cervical radiculopathy, no red flags. Additional history provided: Patient reports neck pain radiating to left shoulder. EXAM: CT CERVICAL SPINE WITHOUT CONTRAST TECHNIQUE: Multidetector CT imaging of the cervical spine was performed without intravenous contrast. Multiplanar CT image reconstructions were also generated. RADIATION DOSE REDUCTION: This exam was performed according to the departmental dose-optimization program which includes automated exposure control, adjustment of the mA and/or kV according to patient size and/or use of iterative reconstruction technique. COMPARISON:  No pertinent prior exams available for comparison. FINDINGS: Alignment: Dextrocurvature of the cervical spine. Reversal of the expected cervical lordosis. Slight C2-C3 grade 1 anterolisthesis. Skull base and vertebrae: The basion-dental and atlanto-dental intervals are maintained.No evidence of cervical spine fracture. Soft tissues and spinal canal: No paraspinal mass or collection. Disc levels: Mild multilevel  disc space narrowing, greatest at C5-C6. C2-C3: Slight grade 1 anterolisthesis. No significant disc herniation or spinal canal stenosis is appreciated. No significant bony neural foraminal narrowing. C3-C4: No significant disc herniation or spinal canal stenosis is appreciated. Uncovertebral hypertrophy on the left resulting in mild relative left neural foraminal narrowing. C4-C5: No significant disc herniation or spinal canal stenosis is appreciated. Uncovertebral hypertrophy on the left resulting in mild left neural foraminal narrowing. C5-C6: Disc bulge. Superimposed central disc protrusion (series 4, image 52). Mild endplate spurring and left-sided uncovertebral hypertrophy. Apparent moderate spinal canal stenosis. No significant bony neural foraminal narrowing. C6-C7: No significant disc herniation or spinal canal stenosis is appreciated. No significant bony neural foraminal narrowing. C7-T1: No significant disc herniation or spinal canal stenosis is appreciated. No significant bony neural foraminal narrowing. Upper chest: No consolidation within the imaged lung apices. IMPRESSION: 1. Cervical spondylosis, as outlined and  with findings most notably as follows. 2. At C5-C6, a disc bulge and superimposed central disc protrusion contribute to apparent moderate spinal canal stenosis. 3. At C3-C4 and C4-C5, uncovertebral hypertrophy results in mild left-sided bony neural foraminal narrowing. 4. Nonspecific reversal of the expected cervical lordosis. 5. Dextrocurvature of the cervical spine 6. C2-C3 grade 1 anterolisthesis. Electronically Signed   By: Kellie Simmering D.O.   On: 07/10/2022 10:55    Procedures Procedures    Medications Ordered in ED Medications  dexamethasone (DECADRON) injection 10 mg (has no administration in time range)    ED Course/ Medical Decision Making/ A&P                           Medical Decision Making Amount and/or Complexity of Data Reviewed Radiology: ordered.   This  patient presents to the ED for concern of neck and shoulder pain, this involves an extensive number of treatment options, and is a complaint that carries with it a high risk of complications and morbidity.  The differential diagnosis includes herniated cervical disks, rotator cuff injury, fracture, dislocation, and others   Co morbidities that complicate the patient evaluation  History of possible previous herniated disc   Additional history obtained:  External records from outside source obtained and reviewed including recent family medicine visits for sinusitis in December and COVID-19 in September.  No recent orthopedic or neurosurgery visits available  Imaging Studies ordered:  I ordered imaging studies including CT cervical spine without contrast I independently visualized and interpreted imaging which showed  1. Cervical spondylosis, as outlined and with findings most notably  as follows.  2. At C5-C6, a disc bulge and superimposed central disc protrusion  contribute to apparent moderate spinal canal stenosis.  3. At C3-C4 and C4-C5, uncovertebral hypertrophy results in mild  left-sided bony neural foraminal narrowing.  4. Nonspecific reversal of the expected cervical lordosis.  5. Dextrocurvature of the cervical spine  6. C2-C3 grade 1 anterolisthesis.   I agree with the radiologist interpretation   Problem List / ED Course / Critical interventions / Medication management   I ordered medication including Decadron for inflammation  Reevaluation of the patient after these medicines showed that the patient stayed the same I have reviewed the patients home medicines and have made adjustments as needed    Test / Admission - Considered:  The patient has pain likely due to a disc bulge at the C5-C6 level.  Plan to have patient follow-up with neurosurgery as an outpatient.  Decadron ordered while here at the hospital.  Plan to discharge patient home on a steroid Dosepak and  prescribe her meloxicam.        Final Clinical Impression(s) / ED Diagnoses Final diagnoses:  Neck pain  Cervical disc disorder at C5-C6 level with radiculopathy    Rx / DC Orders ED Discharge Orders          Ordered    methylPREDNISolone (MEDROL DOSEPAK) 4 MG TBPK tablet        07/10/22 1119    meloxicam (MOBIC) 15 MG tablet  Daily        07/10/22 1119              Ronny Bacon 07/10/22 1210    Teressa Lower, MD 07/10/22 [phone]

## 2022-07-10 NOTE — ED Triage Notes (Signed)
Pt c/o neck pain that radiates down her left shoulder  Pt states she has been carrying wood and is unsure if that may be what has cause an injury

## 2022-07-12 ENCOUNTER — Ambulatory Visit: Payer: BLUE CROSS/BLUE SHIELD

## 2022-07-12 ENCOUNTER — Ambulatory Visit
Admission: RE | Admit: 2022-07-12 | Discharge: 2022-07-12 | Disposition: A | Discharge status: Home / Self Care | Payer: BLUE CROSS/BLUE SHIELD | Source: Ambulatory Visit | Attending: Nurse Practitioner | Admitting: Nurse Practitioner

## 2022-07-12 VITALS — BP 129/87 | HR 91 | Temp 97.8°F | Resp 16

## 2022-07-12 DIAGNOSIS — M5412 Radiculopathy, cervical region: Secondary | ICD-10-CM

## 2022-07-12 MED ORDER — KETOROLAC TROMETHAMINE 30 MG/ML IJ SOLN
30.0000 mg | Freq: Once | INTRAMUSCULAR | Status: AC
  Administered 2022-07-12: 30 mg via INTRAMUSCULAR

## 2022-07-12 MED ORDER — METHOCARBAMOL 500 MG PO TABS: 500.0000 mg | ORAL_TABLET | Freq: Two times a day (BID) | ORAL | 0 refills | Status: DC

## 2022-07-12 NOTE — ED Provider Notes (Signed)
[facility]    CSN: [id number] Arrival date & time: 07/12/22  5176      History   Chief Complaint Chief Complaint  Patient presents with   Neck Injury    Severe pain in the left side of my neck and left shoulder that radiates pain down my arm along with tingling and numbness. - Entered by patient   Appointment    1030   Neck Pain    HPI Nicole Rodriguez is a 32 y.o. female.   The history is provided by the patient.   The patient presents for continued neck pain that is been going on for the past 5 days. Patient denies injury or trauma. Patient was seen in the ER on 07/10/22 and diagnosed with cervical radiculopathy. Pain is located in the left neck, with radiation into the left shoulder. Patient describes the pain as "sharp". Patient also complains of numbness and tingling. Patient states this began after she was lifting firewood over the weekend. She does endorse possible previous bulging disc in her neck.  Patient was prescribed a Medrol dose pack and Diclofenac for her pain. Patient reports she is on her third day of the corticosteroid. Patient reports she has also been using a heating pad for pain and neck tightness.   Past Medical History:  Diagnosis Date   Asthma    Bipolar disorder ([place])    Generalized anxiety disorder    GERD (gastroesophageal reflux disease)    Phreesia 08/17/2020   IBS (irritable bowel syndrome)    Plantar fasciitis 10/15/2018   Rectal bleeding 09/24/2019   Social anxiety disorder     Patient Active Problem List   Diagnosis Date Noted   Intermittent palpitations 08/20/2020   Gastric polyp    GAD (generalized anxiety disorder) 07/19/2019   Social anxiety disorder 07/19/2019   Panic disorder 07/19/2019   Bipolar 2 disorder ([place]) 07/19/2019   Allergic rhinitis 08/19/2018   GERD (gastroesophageal reflux disease) 08/19/2018   Asthma 11/29/2017   Breast mass, right 03/18/2014   Numbness and tingling in both hands 01/26/2014    Past  Surgical History:  Procedure Laterality Date   APPENDECTOMY     BREAST LUMPECTOMY     COLONOSCOPY WITH PROPOFOL N/A 12/03/2019   Procedure: COLONOSCOPY WITH PROPOFOL;  Surgeon: Virgel Manifold, MD;  Location: [facility] END[facility];  Service: Endoscopy;  Laterality: N/A;   CYSTOSCOPY W/ URETERAL STENT PLACEMENT Right 08/31/2021   Procedure: CYSTOSCOPY WITH BILATERAL RETROGRADE PYELOGRAM;  Surgeon: Primus Bravo., MD;  Location: [facility];  Service: Urology;  Laterality: Right;   ESOPHAGOGASTRODUODENOSCOPY (EGD) WITH PROPOFOL N/A 12/03/2019   Procedure: ESOPHAGOGASTRODUODENOSCOPY (EGD) WITH PROPOFOL;  Surgeon: Virgel Manifold, MD;  Location: [facility] END[facility]COPY;  Service: Endoscopy;  Laterality: N/A;   FOOT SURGERY     URETEROSCOPY Right 08/31/2021   Procedure: URETEROSCOPY;  Surgeon: Primus Bravo., MD;  Location: [facility];  Service: Urology;  Laterality: Right;    OB History     Gravida  0   Para  0   Term  0   Preterm  0   AB  0   Living  0      SAB  0   IAB  0   Ectopic  0   Multiple  0   Live Births  0            Home Medications    Prior to Admission medications   Medication Sig Start Date End Date Taking? Authorizing Provider  methocarbamol (ROBAXIN) 500 MG tablet Take 1 tablet (500 mg total) by mouth 2 (two) times daily. 07/12/22  Yes Isaiha Asare-Warren, Alda Lea, NP  albuterol (PROVENTIL) (2.5 MG/3ML) 0.083% nebulizer solution Take 3 mLs (2.5 mg total) by nebulization every 6 (six) hours as needed for wheezing or shortness of breath. 02/11/20   Malachy Mood, MD  albuterol (VENTOLIN HFA) 108 (90 Base) MCG/ACT inhaler Inhale 2 puffs into the lungs every 6 (six) hours as needed for wheezing or shortness of breath. 06/30/21   Lindell Spar, MD  Azelastine HCl 137 MCG/SPRAY SOLN Place 2 sprays into both nostrils 2 (two) times daily as needed. 05/29/22   Larose Kells, MD  benzonatate (TESSALON) 100 MG capsule Take 1 capsule (100 mg total) by mouth 3 (three)  times daily as needed. 06/23/22   Mar Daring, PA-C  buPROPion [facility] SR) 200 MG 12 hr tablet 1 bid 06/06/22   Plovsky, Berneta Sages, MD  clonazePAM [facility] [place]) 1 MG tablet 2 qhs 06/06/22   Plovsky, Berneta Sages, MD  doxycycline (VIBRA-TABS) 100 MG tablet Take 1 tablet (100 mg total) by mouth 2 (two) times daily. 06/23/22   Mar Daring, PA-C  famotidine (PEPCID) 40 MG tablet TAKE 1 TABLET BY MOUTH TWICE A DAY 04/12/22   Althea Charon, FNP  FASENRA PEN 30 MG/ML SOAJ Inject 30 mg into the skin every 28 (twenty-eight) days. Benralizumab. For severe eosinophilic asthma 2/69/48   [provider]  fluticasone (FLONASE) 50 MCG/ACT nasal spray Place 2 sprays into both nostrils daily. 05/29/22   Larose Kells, MD  fluticasone-salmeterol (ADVAIR DISKUS) 250-50 MCG/ACT AEPB Inhale 1 puff into the lungs in the morning and at bedtime. 04/19/22 05/19/22  Valentina Shaggy, MD  levocetirizine (XYZAL) 5 MG tablet TAKE 1 TABLET BY MOUTH EVERY DAY IN THE EVENING 07/10/22   Althea Charon, FNP  meloxicam (MOBIC) 15 MG tablet Take 1 tablet (15 mg total) by mouth daily. 07/10/22 08/09/22  Dorothyann Peng, PA-C  methylPREDNISolone (MEDROL DOSEPAK) 4 MG TBPK tablet Take as directed per package instructions 07/10/22   Cherlynn June B, PA-C  montelukast (SINGULAIR) 10 MG tablet Take 1 tablet (10 mg total) by mouth at bedtime. 05/29/22   Larose Kells, MD  pantoprazole (PROTONIX) 40 MG tablet TAKE 1 TABLET (40 MG TOTAL) BY MOUTH TWICE A DAY BEFORE MEALS 03/27/22   Lindell Spar, MD  promethazine-dextromethorphan (PROMETHAZINE-DM) 6.25-15 MG/5ML syrup Take 5 mLs by mouth 4 (four) times daily as needed. 06/23/22   Mar Daring, PA-C    Family History Family History  Problem Relation Age of Onset   Allergic rhinitis Father    Asthma Father    Crohn's disease Sister        half sister   Anxiety disorder Maternal Grandmother    Depression Maternal Grandmother    Diverticulitis Paternal  Grandmother    Diabetes Other    Heart failure Other    Colon cancer Neg Hx    Celiac disease Neg Hx     Social History Social History   Tobacco Use   Smoking status: Never    Passive exposure: Never   Smokeless tobacco: Never  Vaping Use   Vaping Use: Never used  Substance Use Topics   Alcohol use: Yes    Comment: socially    Drug use: No     Allergies   Amoxicillin, Asa [aspirin], Cefoxitin, and Sulfa antibiotics   Review of Systems Review of Systems Per HPI  Physical Exam Triage  Vital Signs ED Triage Vitals [07/12/22 1004]  Enc Vitals Group     BP 129/87     Pulse Rate 91     Resp 16     Temp 97.8 F (36.6 C)     Temp Source Oral     SpO2 95 %     Weight      Height      Head Circumference      Peak Flow      Pain Score      Pain Loc      Pain Edu?      Excl. in [place]?    No data found.  Updated Vital Signs BP 129/87 (BP Location: Right Arm)   Pulse 91   Temp 97.8 F (36.6 C) (Oral)   Resp 16   LMP 06/30/2022 (Exact Date)   SpO2 95%   Visual Acuity Right Eye Distance:   Left Eye Distance:   Bilateral Distance:    Right Eye Near:   Left Eye Near:    Bilateral Near:     Physical Exam Vitals and nursing note reviewed.  Constitutional:      General: She is not in acute distress.    Appearance: Normal appearance.  HENT:     Head: Normocephalic.  Eyes:     Extraocular Movements: Extraocular movements intact.     Conjunctiva/sclera: Conjunctivae normal.     Pupils: Pupils are equal, round, and reactive to light.  Abdominal:     General: Bowel sounds are normal.     Palpations: Abdomen is soft.  Musculoskeletal:     Cervical back: No edema. Pain with movement, spinous process tenderness and muscular tenderness present. Decreased range of motion.  Lymphadenopathy:     Cervical: No cervical adenopathy.  Skin:    General: Skin is warm and dry.  Neurological:     General: No focal deficit present.     Mental Status: She is alert and  oriented to person, place, and time.  Psychiatric:        Mood and Affect: Mood normal.        Behavior: Behavior normal.      UC Treatments / Results  Labs (all labs ordered are listed, but only abnormal results are displayed) Labs Reviewed - No data to display  EKG   Radiology No results found.  Procedures Procedures (including critical care time)  Medications Ordered in UC Medications  ketorolac (TORADOL) 30 MG/ML injection 30 mg (30 mg Intramuscular Given 07/12/22 1041)    Initial Impression / Assessment and Plan / UC Course  I have reviewed the triage vital signs and the nursing notes.  Pertinent labs & imaging results that were available during my care of the patient were reviewed by me and considered in my medical decision making (see chart for details).  Patient is well-appearing, she is in no acute distress, vital signs are stable.  Symptoms continue to be consistent with cervical radiculopathy.  Patient is currently taking a Medrol Dosepak and diclofenac.  Will have patient continue the corticosteroid.  Patient was given Toradol 30 mg IM injection in the clinic today.  For patient's neck tightness, will start her on methocarbamol 500 mg.  Supportive care recommendations were provided to the patient to include use of over-the-counter Tylenol arthritis strength for breakthrough pain or discomfort while she is taking the steroid.  Patient is scheduled to see neurosurgery on 07/18/2022, advised to keep that appointment.  Patient was given strict ER precautions.  Patient verbalizes understanding.  All questions were answered.  Patient stable for discharge.  Work note was provided.   Final Clinical Impressions(s) / UC Diagnoses   Final diagnoses:  Cervical radiculopathy     Discharge Instructions      Take medication as prescribed. May take over-the-counter Tylenol arthritis strength to help with breakthrough pain or discomfort. Gentle stretching and range of motion  exercises while symptoms persist. May apply ice or heat as needed.  Apply ice for pain or swelling, heat for spasm or stiffness.  Apply for 20 minutes, remove for 1 hour, then repeat is much as possible.   If you develop severe numbness and tingling in your arms, become unable to move your head, have numbness and tingling in your legs and feet, or other concerns, please go to the emergency department immediately. Keep appointment with neurosurgery on 07/18/2022. Follow-up as needed.     ED Prescriptions     Medication Sig Dispense Auth. Provider   methocarbamol (ROBAXIN) 500 MG tablet Take 1 tablet (500 mg total) by mouth 2 (two) times daily. 20 tablet Delaine Hernandez-Warren, Alda Lea, NP      PDMP not reviewed this encounter.   Tish Men, NP 07/12/22 1044

## 2022-07-12 NOTE — Discharge Instructions (Addendum)
Take medication as prescribed. May take over-the-counter Tylenol arthritis strength to help with breakthrough pain or discomfort. Gentle stretching and range of motion exercises while symptoms persist. May apply ice or heat as needed.  Apply ice for pain or swelling, heat for spasm or stiffness.  Apply for 20 minutes, remove for 1 hour, then repeat is much as possible.   If you develop severe numbness and tingling in your arms, become unable to move your head, have numbness and tingling in your legs and feet, or other concerns, please go to the emergency department immediately. Keep appointment with neurosurgery on 07/18/2022. Follow-up as needed.

## 2022-07-12 NOTE — ED Triage Notes (Signed)
Pt reports left sided neck pain, left shoulder pain x 5 days. Pain is worse when moving. Pt was seeing at the ED for the same complaints. Pt has a referral done by the ED to see a Neurosurgeon on 07/18/22.

## 2022-07-14 ENCOUNTER — Telehealth: Payer: Self-pay

## 2022-07-14 NOTE — Telephone Encounter (Signed)
Transition Care Management Unsuccessful Follow-up Telephone Call  Date of discharge and from where:  Nicole [facility] 07/10/22 -neck pain  Attempts:  1st Attempt  Reason for unsuccessful TCM follow-up call:  Left voice message

## 2022-07-17 NOTE — Telephone Encounter (Signed)
Transition Care Management Unsuccessful Follow-up Telephone Call  Date of discharge and from where:  07/10/2022 Nicole [facility] - neck pain  Attempts:  2nd Attempt  Reason for unsuccessful TCM follow-up call:  Left voice message

## 2022-07-24 ENCOUNTER — Ambulatory Visit: Payer: BLUE CROSS/BLUE SHIELD

## 2022-07-24 ENCOUNTER — Ambulatory Visit
Admission: RE | Admit: 2022-07-24 | Discharge: 2022-07-24 | Disposition: A | Discharge status: Home / Self Care | Payer: BLUE CROSS/BLUE SHIELD | Source: Ambulatory Visit | Attending: Nurse Practitioner | Admitting: Nurse Practitioner

## 2022-07-24 VITALS — BP 128/85 | HR 95 | Temp 98.0°F | Resp 18

## 2022-07-24 DIAGNOSIS — Z1152 Encounter for screening for COVID-19: Secondary | ICD-10-CM

## 2022-07-24 DIAGNOSIS — J069 Acute upper respiratory infection, unspecified: Secondary | ICD-10-CM

## 2022-07-24 DIAGNOSIS — R051 Acute cough: Secondary | ICD-10-CM | POA: Diagnosis present

## 2022-07-24 MED ORDER — BENZONATATE 100 MG PO CAPS: 100.0000 mg | ORAL_CAPSULE | Freq: Three times a day (TID) | ORAL | 0 refills | Status: DC | PRN

## 2022-07-24 NOTE — Discharge Instructions (Signed)
You have a viral upper respiratory infection.  Symptoms should improve over the next week to 10 days.  If you develop chest pain or shortness of breath, go to the emergency room.  We have tested you today for COVID-19.  You will see the results in Mychart and we will call you with positive results.    Please stay home and isolate until you are aware of the results.    Some things that can make you feel better are: - Increased rest - Increasing fluid with water/sugar free electrolytes - Acetaminophen and ibuprofen as needed for fever/pain - Salt water gargling, chloraseptic spray and throat lozenges - OTC guaifenesin (Mucinex) 600 mg twice daily for congestion - Saline sinus flushes or a neti pot for congestion - Humidifying the air -Tessalon Perles every 8 hours as needed for dry cough

## 2022-07-24 NOTE — ED Triage Notes (Signed)
Cough, nasal congestion, fatigue, coughing up green, yellow mucus, SOB and wheezing with activity, Started Saturday. Temp of 101 last night. Took tylenol and ibuprofen today also Theraflu.

## 2022-07-24 NOTE — ED Provider Notes (Signed)
[facility]    CSN: [id number] Arrival date & time: 07/24/22  1521      History   Chief Complaint Chief Complaint  Patient presents with   Cough    Cough with mucus production, chest congestion and pain, wheezing, fever, getting hot then cold, nasal congestion, fatigue and overall not feeling good. - Entered by patient   Fatigue    HPI Nicole Rodriguez is a 32 y.o. female.   Patient presents today for 2-day history of bodyaches, chills, productive cough, shortness of breath and wheezing after coughing fits, chest pain with coughing, chest tightness, chest congestion and nasal congestion, runny nose, decreased appetite, and fatigue.  She denies sore throat, ear pain, headache, abdominal pain, nausea/vomiting, and diarrhea.  Reports has been trying to drink lots of fluids.  Has been using rescue nebulizer/albuterol inhaler, Tylenol Cold and flu, TheraFlu with some benefit.  Denies smoking or vaping history.    Past Medical History:  Diagnosis Date   Asthma    Bipolar disorder ([place])    Generalized anxiety disorder    GERD (gastroesophageal reflux disease)    Phreesia 08/17/2020   IBS (irritable bowel syndrome)    Plantar fasciitis 10/15/2018   Rectal bleeding 09/24/2019   Social anxiety disorder     Patient Active Problem List   Diagnosis Date Noted   Intermittent palpitations 08/20/2020   Gastric polyp    GAD (generalized anxiety disorder) 07/19/2019   Social anxiety disorder 07/19/2019   Panic disorder 07/19/2019   Bipolar 2 disorder ([place]) 07/19/2019   Allergic rhinitis 08/19/2018   GERD (gastroesophageal reflux disease) 08/19/2018   Asthma 11/29/2017   Breast mass, right 03/18/2014   Numbness and tingling in both hands 01/26/2014    Past Surgical History:  Procedure Laterality Date   APPENDECTOMY     BREAST LUMPECTOMY     COLONOSCOPY WITH PROPOFOL N/A 12/03/2019   Procedure: COLONOSCOPY WITH PROPOFOL;  Surgeon: Virgel Manifold, MD;  Location:  [facility];  Service: Endoscopy;  Laterality: N/A;   CYSTOSCOPY W/ URETERAL STENT PLACEMENT Right 08/31/2021   Procedure: CYSTOSCOPY WITH BILATERAL RETROGRADE PYELOGRAM;  Surgeon: Primus Bravo., MD;  Location: [facility];  Service: Urology;  Laterality: Right;   ESOPHAGOGASTRODUODENOSCOPY (EGD) WITH PROPOFOL N/A 12/03/2019   Procedure: ESOPHAGOGASTRODUODENOSCOPY (EGD) WITH PROPOFOL;  Surgeon: Virgel Manifold, MD;  Location: [facility];  Service: Endoscopy;  Laterality: N/A;   FOOT SURGERY     URETEROSCOPY Right 08/31/2021   Procedure: URETEROSCOPY;  Surgeon: Primus Bravo., MD;  Location: [facility];  Service: Urology;  Laterality: Right;    OB History     Gravida  0   Para  0   Term  0   Preterm  0   AB  0   Living  0      SAB  0   IAB  0   Ectopic  0   Multiple  0   Live Births  0            Home Medications    Prior to Admission medications   Medication Sig Start Date End Date Taking? Authorizing Provider  albuterol (PROVENTIL) (2.5 MG/3ML) 0.083% nebulizer solution Take 3 mLs (2.5 mg total) by nebulization every 6 (six) hours as needed for wheezing or shortness of breath. 02/11/20  Yes Malachy Mood, MD  albuterol (VENTOLIN HFA) 108 (90 Base) MCG/ACT inhaler Inhale 2 puffs into the lungs every 6 (six) hours as needed for wheezing or shortness of breath.  06/30/21  Yes Lindell Spar, MD  Azelastine HCl 137 MCG/SPRAY SOLN Place 2 sprays into both nostrils 2 (two) times daily as needed. 05/29/22  Yes Larose Kells, MD  buPROPion [facility] SR) 200 MG 12 hr tablet 1 bid 06/06/22  Yes Plovsky, Berneta Sages, MD  clonazePAM (KLONOPIN) 1 MG tablet 2 qhs 06/06/22  Yes Plovsky, Berneta Sages, MD  famotidine (PEPCID) 40 MG tablet TAKE 1 TABLET BY MOUTH TWICE A DAY 04/12/22  Yes Althea Charon, FNP  FASENRA PEN 30 MG/ML SOAJ Inject 30 mg into the skin every 28 (twenty-eight) days. Benralizumab. For severe eosinophilic asthma 09/15/38  Yes [provider]   fluticasone (FLONASE) 50 MCG/ACT nasal spray Place 2 sprays into both nostrils daily. 05/29/22  Yes Larose Kells, MD  levocetirizine (XYZAL) 5 MG tablet TAKE 1 TABLET BY MOUTH EVERY DAY IN THE EVENING 07/10/22  Yes Althea Charon, FNP  meloxicam (MOBIC) 15 MG tablet Take 1 tablet (15 mg total) by mouth daily. 07/10/22 08/09/22 Yes Dorothyann Peng, PA-C  methocarbamol (ROBAXIN) 500 MG tablet Take 1 tablet (500 mg total) by mouth 2 (two) times daily. 07/12/22  Yes Leath-Warren, Alda Lea, NP  montelukast (SINGULAIR) 10 MG tablet Take 1 tablet (10 mg total) by mouth at bedtime. 05/29/22  Yes Patel, Odette Horns, MD  pantoprazole (PROTONIX) 40 MG tablet TAKE 1 TABLET (40 MG TOTAL) BY MOUTH TWICE A DAY BEFORE MEALS 03/27/22  Yes Lindell Spar, MD  benzonatate (TESSALON) 100 MG capsule Take 1 capsule (100 mg total) by mouth 3 (three) times daily as needed. Do not take with alcohol or while driving or operating heavy machinery.  May cause drowsiness. 07/24/22   Eulogio Bear, NP  fluticasone-salmeterol (ADVAIR DISKUS) 250-50 MCG/ACT AEPB Inhale 1 puff into the lungs in the morning and at bedtime. 04/19/22 05/19/22  Valentina Shaggy, MD    Family History Family History  Problem Relation Age of Onset   Allergic rhinitis Father    Asthma Father    Crohn's disease Sister        half sister   Anxiety disorder Maternal Grandmother    Depression Maternal Grandmother    Diverticulitis Paternal Grandmother    Diabetes Other    Heart failure Other    Colon cancer Neg Hx    Celiac disease Neg Hx     Social History Social History   Tobacco Use   Smoking status: Never    Passive exposure: Never   Smokeless tobacco: Never  Vaping Use   Vaping Use: Never used  Substance Use Topics   Alcohol use: Yes    Comment: socially    Drug use: No     Allergies   Amoxicillin, Asa [aspirin], Cefoxitin, and Sulfa antibiotics   Review of Systems Review of Systems Per HPI  Physical Exam Triage  Vital Signs ED Triage Vitals [07/24/22 1529]  Enc Vitals Group     BP 128/85     Pulse Rate 95     Resp 18     Temp 98 F (36.7 C)     Temp Source Oral     SpO2 98 %     Weight      Height      Head Circumference      Peak Flow      Pain Score 3     Pain Loc      Pain Edu?      Excl. in [place]?    No data found.  Updated Vital Signs BP 128/85 (BP Location: Right Arm)   Pulse 95   Temp 98 F (36.7 C) (Oral)   Resp 18   LMP 06/30/2022 (Exact Date)   SpO2 98%   Visual Acuity Right Eye Distance:   Left Eye Distance:   Bilateral Distance:    Right Eye Near:   Left Eye Near:    Bilateral Near:     Physical Exam Vitals and nursing note reviewed.  Constitutional:      General: She is not in acute distress.    Appearance: Normal appearance. She is not ill-appearing or toxic-appearing.  HENT:     Head: Normocephalic and atraumatic.     Right Ear: Tympanic membrane, ear canal and external ear normal.     Left Ear: Tympanic membrane, ear canal and external ear normal.     Nose: Congestion and rhinorrhea present.     Mouth/Throat:     Mouth: Mucous membranes are moist.     Pharynx: Oropharynx is clear. Posterior oropharyngeal erythema present. No oropharyngeal exudate.  Eyes:     General: No scleral icterus.    Extraocular Movements: Extraocular movements intact.  Cardiovascular:     Rate and Rhythm: Normal rate and regular rhythm.     Heart sounds: Normal heart sounds. No murmur heard. Pulmonary:     Effort: Pulmonary effort is normal. No respiratory distress.     Breath sounds: Normal breath sounds. No wheezing, rhonchi or rales.  Abdominal:     General: Abdomen is flat. Bowel sounds are normal. There is no distension.     Palpations: Abdomen is soft.     Tenderness: There is no abdominal tenderness.  Musculoskeletal:     Cervical back: Normal range of motion and neck supple.  Lymphadenopathy:     Cervical: Cervical adenopathy present.  Skin:    General: Skin is  warm and dry.     Coloration: Skin is not jaundiced or pale.     Findings: No erythema or rash.  Neurological:     Mental Status: She is alert and oriented to person, place, and time.  Psychiatric:        Behavior: Behavior is cooperative.      UC Treatments / Results  Labs (all labs ordered are listed, but only abnormal results are displayed) Labs Reviewed  SARS CORONAVIRUS 2 (TAT 6-24 HRS)    EKG   Radiology No results found.  Procedures Procedures (including critical care time)  Medications Ordered in UC Medications - No data to display  Initial Impression / Assessment and Plan / UC Course  I have reviewed the triage vital signs and the nursing notes.  Pertinent labs & imaging results that were available during my care of the patient were reviewed by me and considered in my medical decision making (see chart for details).   Patient is well-appearing, normotensive, afebrile, not tachycardic, not tachypneic, oxygenating well on room air.    1. Encounter for screening for COVID-19 2. Viral URI with cough Suspect viral etiology COVID-19 testing obtained Unable to test for influenza today secondary to national shortage of testing materials Supportive care discussed with patient Start cough suppressants as needed No wheezing on exam today, I do not think corticosteroids are needed at this time and discussed this with the patient ER and return precautions discussed Note given for work  The patient was given the opportunity to ask questions.  All questions answered to their satisfaction.  The patient is in agreement to this  plan.    Final Clinical Impressions(s) / UC Diagnoses   Final diagnoses:  Encounter for screening for COVID-19  Viral URI with cough     Discharge Instructions      You have a viral upper respiratory infection.  Symptoms should improve over the next week to 10 days.  If you develop chest pain or shortness of breath, go to the emergency  room.  We have tested you today for COVID-19.  You will see the results in Mychart and we will call you with positive results.    Please stay home and isolate until you are aware of the results.    Some things that can make you feel better are: - Increased rest - Increasing fluid with water/sugar free electrolytes - Acetaminophen and ibuprofen as needed for fever/pain - Salt water gargling, chloraseptic spray and throat lozenges - OTC guaifenesin (Mucinex) 600 mg twice daily for congestion - Saline sinus flushes or a neti pot for congestion - Humidifying the air -Tessalon Perles every 8 hours as needed for dry cough     ED Prescriptions     Medication Sig Dispense Auth. Provider   benzonatate (TESSALON) 100 MG capsule Take 1 capsule (100 mg total) by mouth 3 (three) times daily as needed. Do not take with alcohol or while driving or operating heavy machinery.  May cause drowsiness. 30 capsule Eulogio Bear, NP      PDMP not reviewed this encounter.   Eulogio Bear, NP 07/24/22 [phone]

## 2022-07-25 LAB — SARS CORONAVIRUS 2 (TAT 6-24 HRS): SARS Coronavirus 2: NEGATIVE

## 2022-07-26 ENCOUNTER — Encounter: Payer: Self-pay | Admitting: Family Medicine

## 2022-07-26 ENCOUNTER — Ambulatory Visit (INDEPENDENT_AMBULATORY_CARE_PROVIDER_SITE_OTHER): Payer: BLUE CROSS/BLUE SHIELD | Admitting: Family Medicine

## 2022-07-26 ENCOUNTER — Encounter: Payer: Self-pay | Admitting: Allergy & Immunology

## 2022-07-26 ENCOUNTER — Other Ambulatory Visit: Payer: Self-pay

## 2022-07-26 DIAGNOSIS — K219 Gastro-esophageal reflux disease without esophagitis: Secondary | ICD-10-CM | POA: Diagnosis not present

## 2022-07-26 DIAGNOSIS — J4551 Severe persistent asthma with (acute) exacerbation: Secondary | ICD-10-CM | POA: Diagnosis not present

## 2022-07-26 DIAGNOSIS — J302 Other seasonal allergic rhinitis: Secondary | ICD-10-CM

## 2022-07-26 DIAGNOSIS — J069 Acute upper respiratory infection, unspecified: Secondary | ICD-10-CM | POA: Diagnosis not present

## 2022-07-26 MED ORDER — SPIRIVA RESPIMAT 1.25 MCG/ACT IN AERS: 2.0000 | INHALATION_SPRAY | Freq: Every day | RESPIRATORY_TRACT | 1 refills | Status: DC

## 2022-07-26 MED ORDER — PREDNISONE 10 MG PO TABS: ORAL_TABLET | ORAL | 0 refills | Status: DC

## 2022-07-26 NOTE — Patient Instructions (Addendum)
Asthma with acute exacerbation Prednisone 10 mg tablets. Take 2 tablets once a day for 4 days, then take 1 tablet on the 5th day, then stop.   Begin Spiriva 1.25 mcg 2 puffs once a day to prevent cough or wheeze Continue Advair 250-1 puff twice a day to prevent cough or wheeze. Pre-treat with albuterol before using Advair for the next few days Continue albuterol 2 puffs every 4 hours as needed for cough or wheeze OR Instead use albuterol 0.083% solution via nebulizer one unit vial every 4 hours as needed for cough or wheeze Continue Fasenra once every 8 weeks for asthma control  Allergic rhinitis Continue avoidance measures directed toward grass pollen as listed below Stop Xyzal for the next 5 days. Then continue Xyzal 5 mg once a day as needed for a runny nose or itch Continue Flonase 2 sprays in each nostril once a day as needed for a stuffy nose Continue azelastine 2 sprays in each nostril twice a day as needed for a runny nose Consider saline nasal rinses as needed for nasal symptoms. Use this before any medicated nasal sprays for best result  URI Increase hydration as tolerated Continue Tessalon Perles as needed for cough Continue over the counter medications as needed for pain or fever  Reflux Continue dietary and lifestyle modifications as listed below Continue pantoprazole 40 mg once a day as previously prescribed. Take this medication 30 minutes before your first meal for best results  Call the clinic if this treatment plan is not working well for you  Follow up in the clinic in 1 months or sooner if needed.  Reducing Pollen Exposure The American Academy of Allergy, Asthma and Immunology suggests the following steps to reduce your exposure to pollen during allergy seasons. Do not hang sheets or clothing out to dry; pollen may collect on these items. Do not mow lawns or spend time around freshly cut grass; mowing stirs up pollen. Keep windows closed at night.  Keep car windows  closed while driving. Minimize morning activities outdoors, a time when pollen counts are usually at their highest. Stay indoors as much as possible when pollen counts or humidity is high and on windy days when pollen tends to remain in the air longer. Use air conditioning when possible.  Many air conditioners have filters that trap the pollen spores. Use a HEPA room air filter to remove pollen form the indoor air you breathe.

## 2022-07-26 NOTE — Progress Notes (Signed)
RE: Nicole Rodriguez MRN: [id number] DOB: [birth year] Date of Telemedicine Visit: 07/26/2022  Referring provider: Lindell Spar, MD Primary care provider: Lindell Spar, MD  Chief Complaint: Cough (Cough started Saturday night feeling tired took covid test was neg.also has stuffy nose.went to urgent care on Monday )   Telemedicine Follow Up Visit via Telephone: I connected with Nicole Rodriguez for a follow up on 07/26/22 by telephone and verified that I am speaking with the correct person using two identifiers.   I discussed the limitations, risks, security and privacy concerns of performing an evaluation and management service by telephone and the availability of in person appointments. I also discussed with the patient that there may be a patient responsible charge related to this service. The patient expressed understanding and agreed to proceed.  Patient is at home Provider is at the office.  Visit start time: 407 Visit end time: 74 Insurance consent/check in by: [employer] consent and medical assistant/nurse: angela  History of Present Illness: She is a 32 y.o. female, who is being followed for asthma, allergic rhinitis, and reflux. Her previous allergy office visit was on 05/29/2022 with  Dr. Posey Pronto . In the interim, she visited [facility] in [place] on 07/24/2021 for evaluation of body aches, chills, productive cough, and shortness of breath.  At that time COVID testing was negative. She reports that her wife's daughter has recently been diagnosed with strep tonsillitis.   At today's visit, she reports that she continues to experience symptoms consistent with viral infection including recent fever, body aches, chills, cough, and nasal congestion.   Asthma is reported as poorly controlled with symptoms including shortness of breath and cough producing light yellow mucus. She continues montelukast 10 mg once a day, Advair 250 1 puff twice a day and has been using  albuterol via nebulizer about once every 6 hours for the last few days. She continues Fasenra once every 8 weeks.  Allergic rhinitis is reported as well controlled until about 1 week ago with recent symptoms including clear thick rhinorrhea, nasal congestion, sneezing, and post nasal drainage with frequent cough and throat clearing. She continues Xyzal 5 mg once a day, Flonase daily, azelastine twice a day, and has recently started taking Mucinex twice a day. She has started taking Tessalon Perles with little relief of cough. Her last environmental allergy testing was on 07/13/2021 and was mildly positive to grass pollens.   Reflux is reported as well controlled with no symptoms including heartburn or vomiting. She continues pantoprazole once a day with relief of symptoms.   Her current medications are listed in the chart.     Assessment and Plan: [place]an is a 32 y.o. female with: Patient Instructions  Asthma with acute exacerbation Prednisone 10 mg tablets. Take 2 tablets once a day for 4 days, then take 1 tablet on the 5th day, then stop.   Begin Spiriva 1.25 mcg 2 puffs once a day to prevent cough or wheeze Continue Advair 250-1 puff twice a day to prevent cough or wheeze. Pre-treat with albuterol before using Advair for the next few days Continue albuterol 2 puffs every 4 hours as needed for cough or wheeze OR Instead use albuterol 0.083% solution via nebulizer one unit vial every 4 hours as needed for cough or wheeze Continue Fasenra once every 8 weeks for asthma control  Allergic rhinitis Continue avoidance measures directed toward grass pollen as listed below Stop Xyzal for the next 5 days. Then continue Xyzal  5 mg once a day as needed for a runny nose or itch Continue Flonase 2 sprays in each nostril once a day as needed for a stuffy nose Continue azelastine 2 sprays in each nostril twice a day as needed for a runny nose Consider saline nasal rinses as needed for nasal symptoms. Use  this before any medicated nasal sprays for best result  URI Increase hydration as tolerated Continue Tessalon Perles as needed for cough Continue over the counter medications as needed for pain or fever  Reflux Continue dietary and lifestyle modifications as listed below Continue pantoprazole 40 mg once a day as previously prescribed Take this medication 30 minutes before your first meal for best results  Call the clinic if this treatment plan is not working well for you  Follow up in the clinic in 1 month or sooner if needed.    Return in about 4 weeks (around 08/23/2022).  Meds ordered this encounter  Medications   predniSONE (DELTASONE) 10 MG tablet    Sig: Prednisone 10 mg tablets. Take 2 tablets once a day for 4 days, then take 1 tablet on the 5th day, then stop.    Dispense:  9 tablet    Refill:  0   Tiotropium Bromide Monohydrate (SPIRIVA RESPIMAT) 1.25 MCG/ACT AERS    Sig: Inhale 2 puffs into the lungs daily.    Dispense:  1 each    Refill:  1    Medication List:  Current Outpatient Medications  Medication Sig Dispense Refill   albuterol (PROVENTIL) (2.5 MG/3ML) 0.083% nebulizer solution Take 3 mLs (2.5 mg total) by nebulization every 6 (six) hours as needed for wheezing or shortness of breath. 75 mL 3   albuterol (VENTOLIN HFA) 108 (90 Base) MCG/ACT inhaler Inhale 2 puffs into the lungs every 6 (six) hours as needed for wheezing or shortness of breath. 8 g 5   Azelastine HCl 137 MCG/SPRAY SOLN Place 2 sprays into both nostrils 2 (two) times daily as needed. 30 mL 3   benzonatate (TESSALON) 100 MG capsule Take 1 capsule (100 mg total) by mouth 3 (three) times daily as needed. Do not take with alcohol or while driving or operating heavy machinery.  May cause drowsiness. 30 capsule 0   buPROPion (WELLBUTRIN SR) 200 MG 12 hr tablet 1 bid 60 tablet 3   clonazePAM (KLONOPIN) 1 MG tablet 2 qhs 60 tablet 4   famotidine (PEPCID) 40 MG tablet TAKE 1 TABLET BY MOUTH TWICE A DAY  180 tablet 1   FASENRA PEN 30 MG/ML SOAJ Inject 30 mg into the skin every 28 (twenty-eight) days. Benralizumab. For severe eosinophilic asthma     fluticasone (FLONASE) 50 MCG/ACT nasal spray Place 2 sprays into both nostrils daily. 16 g 3   levocetirizine (XYZAL) 5 MG tablet TAKE 1 TABLET BY MOUTH EVERY DAY IN THE EVENING 30 tablet 5   meloxicam (MOBIC) 15 MG tablet Take 1 tablet (15 mg total) by mouth daily. 30 tablet 0   methocarbamol (ROBAXIN) 500 MG tablet Take 1 tablet (500 mg total) by mouth 2 (two) times daily. 20 tablet 0   montelukast (SINGULAIR) 10 MG tablet Take 1 tablet (10 mg total) by mouth at bedtime. 90 tablet 1   pantoprazole (PROTONIX) 40 MG tablet TAKE 1 TABLET (40 MG TOTAL) BY MOUTH TWICE A DAY BEFORE MEALS 180 tablet 1   predniSONE (DELTASONE) 10 MG tablet Prednisone 10 mg tablets. Take 2 tablets once a day for 4 days, then take 1  tablet on the 5th day, then stop. 9 tablet 0   Tiotropium Bromide Monohydrate (SPIRIVA RESPIMAT) 1.25 MCG/ACT AERS Inhale 2 puffs into the lungs daily. 1 each 1   fluticasone-salmeterol (ADVAIR DISKUS) 250-50 MCG/ACT AEPB Inhale 1 puff into the lungs in the morning and at bedtime. 60 each 5   No current facility-administered medications for this visit.   Allergies: Allergies  Allergen Reactions   Amoxicillin Hives   Asa [Aspirin] Hives   Cefoxitin Itching and Rash   Sulfa Antibiotics Hives   I reviewed her past medical history, social history, family history, and environmental history and no significant changes have been reported from previous visit on 05/29/2022.   Objective: Physical Exam Not obtained as encounter was done via telephone.   Previous notes and tests were reviewed.  I discussed the assessment and treatment plan with the patient. The patient was provided an opportunity to ask questions and all were answered. The patient agreed with the plan and demonstrated an understanding of the instructions.   The patient was advised  to call back or seek an in-person evaluation if the symptoms worsen or if the condition fails to improve as anticipated.  I provided 23 minutes of non-face-to-face time during this encounter.  It was my pleasure to participate in Nicole Rodriguez's care today. Please feel free to contact me with any questions or concerns.   Sincerely,  Gareth Morgan, FNP

## 2022-08-03 ENCOUNTER — Ambulatory Visit
Admission: RE | Admit: 2022-08-03 | Discharge: 2022-08-03 | Disposition: A | Discharge status: Home / Self Care | Payer: BLUE CROSS/BLUE SHIELD | Source: Ambulatory Visit | Attending: Nurse Practitioner | Admitting: Nurse Practitioner

## 2022-08-03 VITALS — BP 135/83 | HR 100 | Temp 97.4°F | Resp 18

## 2022-08-03 DIAGNOSIS — M5412 Radiculopathy, cervical region: Secondary | ICD-10-CM

## 2022-08-03 MED ORDER — TRAMADOL HCL 50 MG PO TABS: 50.0000 mg | ORAL_TABLET | Freq: Two times a day (BID) | ORAL | 0 refills | Status: AC | PRN

## 2022-08-03 MED ORDER — METHYLPREDNISOLONE 4 MG PO TBPK: ORAL_TABLET | ORAL | 0 refills | Status: DC

## 2022-08-03 NOTE — ED Triage Notes (Signed)
Neck pain since 1/7.  Has had an MRI yesterday.  Has been seeing a neurosurgeon.  States she can not tolerate the pain now.  Has been taking tylenol, meloxicam and robaxin   states pain is on left side of neck and radiates down left arm.

## 2022-08-03 NOTE — ED Provider Notes (Signed)
[facility]    CSN: [id number] Arrival date & time: 08/03/22  1153      History   Chief Complaint Chief Complaint  Patient presents with   Neck Injury    My neck is hurting really bad and I can't get any relief. I'm in severe pain and it's putting down into my left shoulder and radiates numbness down my left arm. - Entered by patient    HPI Nicole Rodriguez is a 32 y.o. female.   Patient presents today for left-sided neck pain that shoots down her left arm.  Reports the pain is severe and sharp in nature.  Reports that she recently had an MRI and is following closely with a neurosurgeon-next appointment is in 5 days.  Reports her pain is severe and she cannot wait until her next appointment for treatment.  Is currently taking Robaxin, meloxicam, and Tylenol for symptoms which has not given relief.  No weakness in the upper extremities, numbness or tingling in the fingertips, or new fevers, nausea/vomiting since the pain began.  No vision changes or headache.    Past Medical History:  Diagnosis Date   Asthma    Bipolar disorder ([place])    Generalized anxiety disorder    GERD (gastroesophageal reflux disease)    Phreesia 08/17/2020   IBS (irritable bowel syndrome)    Plantar fasciitis 10/15/2018   Rectal bleeding 09/24/2019   Social anxiety disorder     Patient Active Problem List   Diagnosis Date Noted   Viral URI 07/26/2022   Intermittent palpitations 08/20/2020   Gastric polyp    GAD (generalized anxiety disorder) 07/19/2019   Social anxiety disorder 07/19/2019   Panic disorder 07/19/2019   Bipolar 2 disorder ([place]) 07/19/2019   Other seasonal allergic rhinitis 08/19/2018   GERD (gastroesophageal reflux disease) 08/19/2018   Asthma 11/29/2017   Breast mass, right 03/18/2014   Numbness and tingling in both hands 01/26/2014    Past Surgical History:  Procedure Laterality Date   APPENDECTOMY     BREAST LUMPECTOMY     COLONOSCOPY WITH PROPOFOL N/A 12/03/2019    Procedure: COLONOSCOPY WITH PROPOFOL;  Surgeon: Virgel Manifold, MD;  Location: [facility];  Service: Endoscopy;  Laterality: N/A;   CYSTOSCOPY W/ URETERAL STENT PLACEMENT Right 08/31/2021   Procedure: CYSTOSCOPY WITH BILATERAL RETROGRADE PYELOGRAM;  Surgeon: Primus Bravo., MD;  Location: [facility];  Service: Urology;  Laterality: Right;   ESOPHAGOGASTRODUODENOSCOPY (EGD) WITH PROPOFOL N/A 12/03/2019   Procedure: ESOPHAGOGASTRODUODENOSCOPY (EGD) WITH PROPOFOL;  Surgeon: Virgel Manifold, MD;  Location: [facility];  Service: Endoscopy;  Laterality: N/A;   FOOT SURGERY     URETEROSCOPY Right 08/31/2021   Procedure: URETEROSCOPY;  Surgeon: Primus Bravo., MD;  Location: [facility];  Service: Urology;  Laterality: Right;    OB History     Gravida  0   Para  0   Term  0   Preterm  0   AB  0   Living  0      SAB  0   IAB  0   Ectopic  0   Multiple  0   Live Births  0            Home Medications    Prior to Admission medications   Medication Sig Start Date End Date Taking? Authorizing Provider  methylPREDNISolone (MEDROL DOSEPAK) 4 MG TBPK tablet Use as directed on package. 08/03/22  Yes Eulogio Bear, NP  traMADol (ULTRAM) 50 MG tablet  Take 1 tablet (50 mg total) by mouth every 12 (twelve) hours as needed for up to 3 days for severe pain. 08/03/22 08/06/22 Yes Eulogio Bear, NP  albuterol (PROVENTIL) (2.5 MG/3ML) 0.083% nebulizer solution Take 3 mLs (2.5 mg total) by nebulization every 6 (six) hours as needed for wheezing or shortness of breath. 02/11/20   Malachy Mood, MD  albuterol (VENTOLIN HFA) 108 (90 Base) MCG/ACT inhaler Inhale 2 puffs into the lungs every 6 (six) hours as needed for wheezing or shortness of breath. 06/30/21   Lindell Spar, MD  Azelastine HCl 137 MCG/SPRAY SOLN Place 2 sprays into both nostrils 2 (two) times daily as needed. 05/29/22   Larose Kells, MD  benzonatate (TESSALON) 100 MG capsule Take 1 capsule (100  mg total) by mouth 3 (three) times daily as needed. Do not take with alcohol or while driving or operating heavy machinery.  May cause drowsiness. 07/24/22   Eulogio Bear, NP  buPROPion [facility] SR) 200 MG 12 hr tablet 1 bid 06/06/22   Plovsky, Berneta Sages, MD  clonazePAM (KLONOPIN) 1 MG tablet 2 qhs 06/06/22   Plovsky, Berneta Sages, MD  famotidine (PEPCID) 40 MG tablet TAKE 1 TABLET BY MOUTH TWICE A DAY 04/12/22   Althea Charon, FNP  FASENRA PEN 30 MG/ML SOAJ Inject 30 mg into the skin every 28 (twenty-eight) days. Benralizumab. For severe eosinophilic asthma 09/21/00   [provider]  fluticasone (FLONASE) 50 MCG/ACT nasal spray Place 2 sprays into both nostrils daily. 05/29/22   Larose Kells, MD  fluticasone-salmeterol (ADVAIR DISKUS) 250-50 MCG/ACT AEPB Inhale 1 puff into the lungs in the morning and at bedtime. 04/19/22 05/19/22  Valentina Shaggy, MD  levocetirizine (XYZAL) 5 MG tablet TAKE 1 TABLET BY MOUTH EVERY DAY IN THE EVENING 07/10/22   Althea Charon, FNP  meloxicam (MOBIC) 15 MG tablet Take 1 tablet (15 mg total) by mouth daily. 07/10/22 08/09/22  Dorothyann Peng, PA-C  methocarbamol (ROBAXIN) 500 MG tablet Take 1 tablet (500 mg total) by mouth 2 (two) times daily. 07/12/22   Leath-Warren, Alda Lea, NP  montelukast (SINGULAIR) 10 MG tablet Take 1 tablet (10 mg total) by mouth at bedtime. 05/29/22   Larose Kells, MD  pantoprazole (PROTONIX) 40 MG tablet TAKE 1 TABLET (40 MG TOTAL) BY MOUTH TWICE A DAY BEFORE MEALS 03/27/22   Lindell Spar, MD  Tiotropium Bromide Monohydrate (SPIRIVA RESPIMAT) 1.25 MCG/ACT AERS Inhale 2 puffs into the lungs daily. 07/26/22   Ambs, Kathrine Cords, FNP    Family History Family History  Problem Relation Age of Onset   Allergic rhinitis Father    Asthma Father    Crohn's disease Sister        half sister   Anxiety disorder Maternal Grandmother    Depression Maternal Grandmother    Diverticulitis Paternal Grandmother    Diabetes Other    Heart  failure Other    Colon cancer Neg Hx    Celiac disease Neg Hx     Social History Social History   Tobacco Use   Smoking status: Never    Passive exposure: Never   Smokeless tobacco: Never  Vaping Use   Vaping Use: Never used  Substance Use Topics   Alcohol use: Yes    Comment: socially    Drug use: No     Allergies   Amoxicillin, Asa [aspirin], Cefoxitin, and Sulfa antibiotics   Review of Systems Review of Systems Per HPI  Physical Exam Triage Vital  Signs ED Triage Vitals  Enc Vitals Group     BP 08/03/22 1201 135/83     Pulse Rate 08/03/22 1201 100     Resp 08/03/22 1201 18     Temp 08/03/22 1201 (!) 97.4 F (36.3 C)     Temp Source 08/03/22 1201 Oral     SpO2 08/03/22 1201 96 %     Weight --      Height --      Head Circumference --      Peak Flow --      Pain Score 08/03/22 1203 10     Pain Loc --      Pain Edu? --      Excl. in [place]? --    No data found.  Updated Vital Signs BP 135/83 (BP Location: Right Arm)   Pulse 100   Temp (!) 97.4 F (36.3 C) (Oral)   Resp 18   LMP 07/26/2022 (Exact Date)   SpO2 96%   Visual Acuity Right Eye Distance:   Left Eye Distance:   Bilateral Distance:    Right Eye Near:   Left Eye Near:    Bilateral Near:     Physical Exam Vitals and nursing note reviewed.  Constitutional:      General: She is not in acute distress.    Appearance: Normal appearance. She is not toxic-appearing.  HENT:     Head: Normocephalic and atraumatic.     Mouth/Throat:     Mouth: Mucous membranes are moist.     Pharynx: Oropharynx is clear.  Neck:     Comments: Inspection: No swelling, obvious deformity, or redness to the neck, left upper back, and left arm Palpation: Cervical spine, left trapezius, scapula tender to palpation.  No obvious deformities palpated. ROM: Full ROM to neck, left upper extremity Strength: 5/5 right upper extremity; 4/5 left upper extremity Neurovascular: neurovascularly intact in left and right upper  extremity   Pulmonary:     Effort: Pulmonary effort is normal. No respiratory distress.  Musculoskeletal:     Cervical back: Normal range of motion. Tenderness present. No rigidity. Pain with movement, spinous process tenderness and muscular tenderness present.  Lymphadenopathy:     Cervical: No cervical adenopathy.  Skin:    General: Skin is warm and dry.     Capillary Refill: Capillary refill takes less than 2 seconds.     Coloration: Skin is not jaundiced or pale.     Findings: No erythema.  Neurological:     Mental Status: She is alert and oriented to person, place, and time.     Motor: No weakness.     Gait: Gait normal.  Psychiatric:        Behavior: Behavior is cooperative.      UC Treatments / Results  Labs (all labs ordered are listed, but only abnormal results are displayed) Labs Reviewed - No data to display  EKG   Radiology No results found.  Procedures Procedures (including critical care time)  Medications Ordered in UC Medications - No data to display  Initial Impression / Assessment and Plan / UC Course  I have reviewed the triage vital signs and the nursing notes.  Pertinent labs & imaging results that were available during my care of the patient were reviewed by me and considered in my medical decision making (see chart for details).   Patient is well-appearing, normotensive, afebrile, not tachycardic, not tachypneic, oxygenating well on room air.    1. Cervical radiculopathy Patient  has known bulging disc in her cervical spine Has follow-up with neurosurgeon scheduled In meantime, will start methylprednisolone Dosepak tomorrow-discussed not to take this at the same time as NSAIDs including meloxicam, ibuprofen, Motrin, Aleve, or Advil Prescription given for tramadol 50 mg every 12 hours as needed for up to 3 days PDMP reviewed and shows she also takes clonazepam-discussed with patient not to take this within a 2-hour window of the tramadol to avoid  overdose Patient verbalizes understanding and is in agreement to plan Follow-up with neurosurgeon sooner than next Tuesday if no improvement in symptoms/pain despite this treatment  The patient was given the opportunity to ask questions.  All questions answered to their satisfaction.  The patient is in agreement to this plan.   Final Clinical Impressions(s) / UC Diagnoses   Final diagnoses:  Cervical radiculopathy     Discharge Instructions      Please start taking the Medrol Dosepak starting tomorrow.  You can take the tramadol every 12 hours as needed for severe pain.  Do not take this within 2 hours of the clonazepam.  Follow-up with neurosurgeon ASAP.    ED Prescriptions     Medication Sig Dispense Auth. Provider   methylPREDNISolone (MEDROL DOSEPAK) 4 MG TBPK tablet Use as directed on package. 21 tablet Noemi Chapel A, NP   traMADol (ULTRAM) 50 MG tablet Take 1 tablet (50 mg total) by mouth every 12 (twelve) hours as needed for up to 3 days for severe pain. 6 tablet Eulogio Bear, NP      I have reviewed the PDMP during this encounter.   Eulogio Bear, NP 08/03/22 [phone]

## 2022-08-03 NOTE — Discharge Instructions (Signed)
Please start taking the Medrol Dosepak starting tomorrow.  You can take the tramadol every 12 hours as needed for severe pain.  Do not take this within 2 hours of the clonazepam.  Follow-up with neurosurgeon ASAP.

## 2022-08-08 ENCOUNTER — Ambulatory Visit (HOSPITAL_COMMUNITY): Payer: BLUE CROSS/BLUE SHIELD | Attending: Physical Therapy | Admitting: Physical Therapy

## 2022-08-08 NOTE — Therapy (Incomplete)
OUTPATIENT PHYSICAL THERAPY CERVICAL EVALUATION   Patient Name: Nicole Rodriguez MRN: [id number] DOB:1990-11-04, 32 y.o., female Today's Date: 08/08/2022  END OF SESSION:   Past Medical History:  Diagnosis Date   Asthma    Bipolar disorder ([place])    Generalized anxiety disorder    GERD (gastroesophageal reflux disease)    Phreesia 08/17/2020   IBS (irritable bowel syndrome)    Plantar fasciitis 10/15/2018   Rectal bleeding 09/24/2019   Social anxiety disorder    Past Surgical History:  Procedure Laterality Date   APPENDECTOMY     BREAST LUMPECTOMY     COLONOSCOPY WITH PROPOFOL N/A 12/03/2019   Procedure: COLONOSCOPY WITH PROPOFOL;  Surgeon: Virgel Manifold, MD;  Location: [facility];  Service: Endoscopy;  Laterality: N/A;   CYSTOSCOPY W/ URETERAL STENT PLACEMENT Right 08/31/2021   Procedure: CYSTOSCOPY WITH BILATERAL RETROGRADE PYELOGRAM;  Surgeon: Primus Bravo., MD;  Location: [facility];  Service: Urology;  Laterality: Right;   ESOPHAGOGASTRODUODENOSCOPY (EGD) WITH PROPOFOL N/A 12/03/2019   Procedure: ESOPHAGOGASTRODUODENOSCOPY (EGD) WITH PROPOFOL;  Surgeon: Virgel Manifold, MD;  Location: [facility];  Service: Endoscopy;  Laterality: N/A;   FOOT SURGERY     URETEROSCOPY Right 08/31/2021   Procedure: URETEROSCOPY;  Surgeon: Primus Bravo., MD;  Location: [facility];  Service: Urology;  Laterality: Right;   Patient Active Problem List   Diagnosis Date Noted   Viral URI 07/26/2022   Intermittent palpitations 08/20/2020   Gastric polyp    GAD (generalized anxiety disorder) 07/19/2019   Social anxiety disorder 07/19/2019   Panic disorder 07/19/2019   Bipolar 2 disorder ([place]) 07/19/2019   Other seasonal allergic rhinitis 08/19/2018   GERD (gastroesophageal reflux disease) 08/19/2018   Asthma 11/29/2017   Breast mass, right 03/18/2014   Numbness and tingling in both hands 01/26/2014    PCP: Ihor Dow MD  REFERRING PROVIDER: Kary Kos, MD  REFERRING  DIAG: cervicalgia  THERAPY DIAG:  No diagnosis found.  Rationale for Evaluation and Treatment: Rehabilitation  ONSET DATE: ***  SUBJECTIVE:                                                                                                                                                                                                         SUBJECTIVE STATEMENT: ***  PERTINENT HISTORY:  Anxiety, bipolar disorder  PAIN:  Are you having pain? {OPRCPAIN:27236}  PRECAUTIONS: {Therapy precautions:24002}  WEIGHT BEARING RESTRICTIONS: {Yes ***/No:24003}  FALLS:  Has patient fallen in last 6 months? {fallsyesno:27318}  LIVING ENVIRONMENT: Lives with: {OPRC lives with:25569::"lives with their family"} Lives in: {  Lives in:25570} Stairs: {opstairs:27293} Has following equipment at home: {Assistive devices:23999}  OCCUPATION: ***  PLOF: {PLOF:24004}  PATIENT GOALS: ***  NEXT MD VISIT: ***  OBJECTIVE:   DIAGNOSTIC FINDINGS:  CT 07/10/22 IMPRESSION: 1. Cervical spondylosis, as outlined and with findings most notably as follows. 2. At C5-C6, a disc bulge and superimposed central disc protrusion contribute to apparent moderate spinal canal stenosis. 3. At C3-C4 and C4-C5, uncovertebral hypertrophy results in mild left-sided bony neural foraminal narrowing. 4. Nonspecific reversal of the expected cervical lordosis. 5. Dextrocurvature of the cervical spine 6. C2-C3 grade 1 anterolisthesis.  PATIENT SURVEYS:  {rehab surveys:24030}  COGNITION: Overall cognitive status: {cognition:24006}  SENSATION: {sensation:27233}  POSTURE: {posture:25561}  PALPATION: ***   CERVICAL ROM:   Active ROM A/PROM (deg) eval  Flexion   Extension   Right lateral flexion   Left lateral flexion   Right rotation   Left rotation    (Blank rows = not tested)  UPPER EXTREMITY ROM:  Active ROM Right eval Left eval  Shoulder flexion    Shoulder extension    Shoulder abduction     Shoulder adduction    Shoulder extension    Shoulder internal rotation    Shoulder external rotation    Elbow flexion    Elbow extension    Wrist flexion    Wrist extension    Wrist ulnar deviation    Wrist radial deviation    Wrist pronation    Wrist supination     (Blank rows = not tested)  UPPER EXTREMITY MMT:  MMT Right eval Left eval  Shoulder flexion    Shoulder extension    Shoulder abduction    Shoulder adduction    Shoulder extension    Shoulder internal rotation    Shoulder external rotation    Middle trapezius    Lower trapezius    Elbow flexion    Elbow extension    Wrist flexion    Wrist extension    Wrist ulnar deviation    Wrist radial deviation    Wrist pronation    Wrist supination    Grip strength     (Blank rows = not tested)  CERVICAL SPECIAL TESTS:  {Cervical special tests:25246}  FUNCTIONAL TESTS:  {Functional tests:24029}  TODAY'S TREATMENT:                                                                                                                              DATE:  08/08/22 ***   PATIENT EDUCATION:  Education details: Patient educated on exam findings, POC, scope of PT, HEP, and ***. Person educated: Patient Education method: Explanation, Demonstration, and Handouts Education comprehension: verbalized understanding, returned demonstration, verbal cues required, and tactile cues required  HOME EXERCISE PROGRAM: ***  ASSESSMENT:  CLINICAL IMPRESSION: Patient a 32 y.o. y.o. female who was seen today for physical therapy evaluation and treatment for cervicalgia. Patient presents with pain limited deficits in cervical spine and UE strength, ROM,  endurance, activity tolerance, and functional mobility with ADL. Patient is having to modify and restrict ADL as indicated by outcome measure score as well as subjective information and objective measures which is affecting overall participation. Patient will benefit from skilled physical  therapy in order to improve function and reduce impairment.  OBJECTIVE IMPAIRMENTS: decreased activity tolerance, decreased endurance, decreased mobility, difficulty walking, decreased ROM, decreased strength, increased muscle spasms, impaired flexibility, impaired sensation, improper body mechanics, postural dysfunction, and pain.   ACTIVITY LIMITATIONS: carrying, lifting, bending, standing, squatting, stairs, transfers, reach over head, locomotion level, and caring for others  PARTICIPATION LIMITATIONS: meal prep, cleaning, laundry, driving, shopping, community activity, occupation, and yard work  PERSONAL FACTORS: Fitness, Time since onset of injury/illness/exacerbation, and 1-2 comorbidities: anxiety, bipolar disorder  are also affecting patient's functional outcome.   REHAB POTENTIAL: {rehabpotential:25112}  CLINICAL DECISION MAKING: {clinical decision making:25114}  EVALUATION COMPLEXITY: {Evaluation complexity:25115}   GOALS: Goals reviewed with patient? Yes  SHORT TERM GOALS: Target date: ***  Patient will be independent with HEP in order to improve functional outcomes. Baseline: Goal status: INITIAL  2.  Patient will report at least 25% improvement in symptoms for improved quality of life. Baseline:  Goal status: INITIAL  3.  *** Baseline: *** Goal status: {GOALSTATUS:25110}  4.  *** Baseline: *** Goal status: {GOALSTATUS:25110}  5.  *** Baseline: *** Goal status: {GOALSTATUS:25110}  6.  *** Baseline: *** Goal status: {GOALSTATUS:25110}  LONG TERM GOALS: Target date: ***  Patient will report at least 75% improvement in symptoms for improved quality of life. Baseline:  Goal status: INITIAL  2.  Patient will improve FOTO score by at least *** points in order to indicate improved tolerance to activity. Baseline: *** Goal status: INITIAL  3.  Patient will demonstrate at least 25% improvement in cervical ROM in all restricted planes for improved ability to  move head ***. Baseline: *** Goal status: INITIAL  4.  Patient will be able to return to all activities unrestricted for improved ability to perform work functions and participate with family.  Baseline: *** Goal status: INITIAL  5.  *** Baseline: *** Goal status: {GOALSTATUS:25110}  6.  *** Baseline: *** Goal status: {GOALSTATUS:25110}     PLAN:  PT FREQUENCY: {rehab frequency:25116}  PT DURATION: {rehab duration:25117}  PLANNED INTERVENTIONS: Therapeutic exercises, Therapeutic activity, Neuromuscular re-education, Balance training, Gait training, Patient/Family education, Joint manipulation, Joint mobilization, Stair training, Orthotic/Fit training, DME instructions, Aquatic Therapy, Dry Needling, Electrical stimulation, Spinal manipulation, Spinal mobilization, Cryotherapy, Moist heat, Compression bandaging, scar mobilization, Splintting, Taping, Traction, Ultrasound, Ionotophoresis '4mg'$ /ml Dexamethasone, and Manual therapy  PLAN FOR NEXT SESSION: ***   Vianne Bulls Antonio Creswell, PT 08/08/2022, 8:35 AM

## 2022-08-21 IMAGING — CT CT RENAL STONE PROTOCOL
2 of 4 series · 16 of 46 positions shown, 18 images · non-contrast
Comparison: September 21, 2019

CLINICAL DATA: Right flank pain.



[Series 2: axial st · axial · 0.66mm/px · z∈[-271,+124]mm · 13 of 91 slices shown, 15 images]
[im 6/91  soft-tissue]
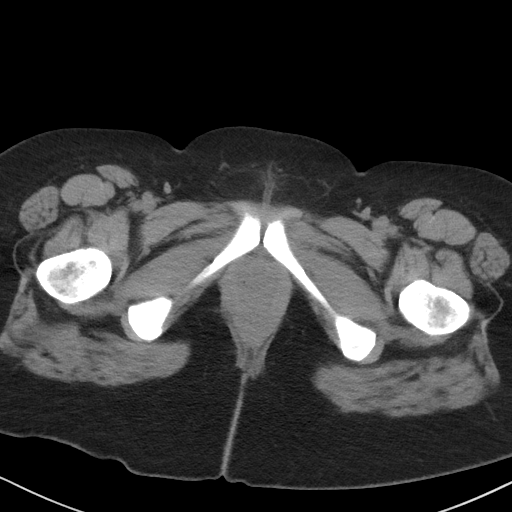
[im 6/91  bone]
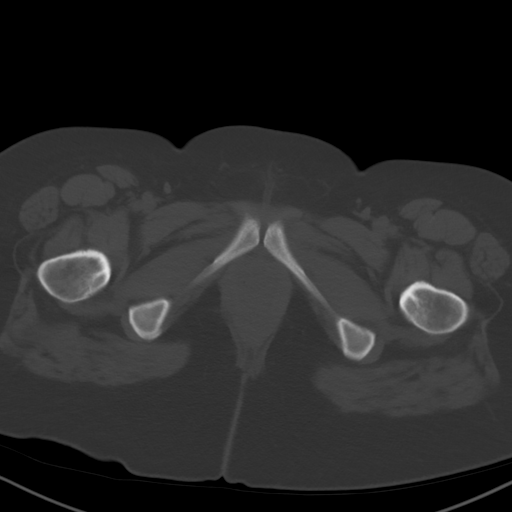
[im 12/91  soft-tissue]
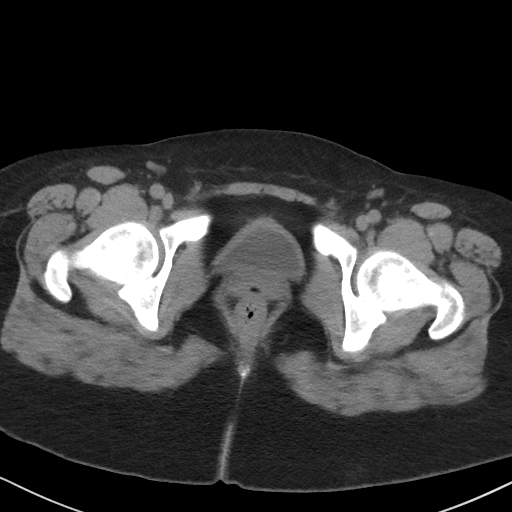
[im 17/91  soft-tissue]
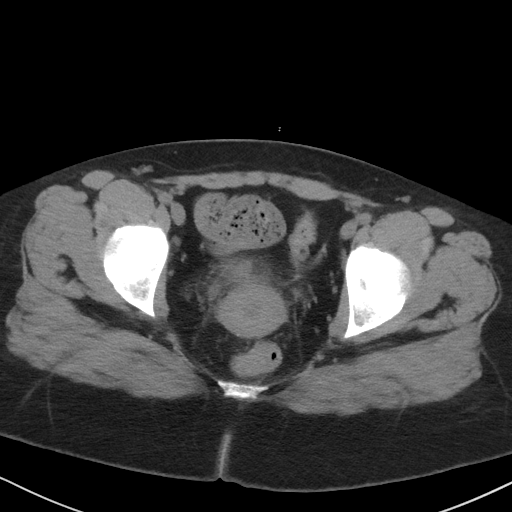
[im 29/91  soft-tissue]
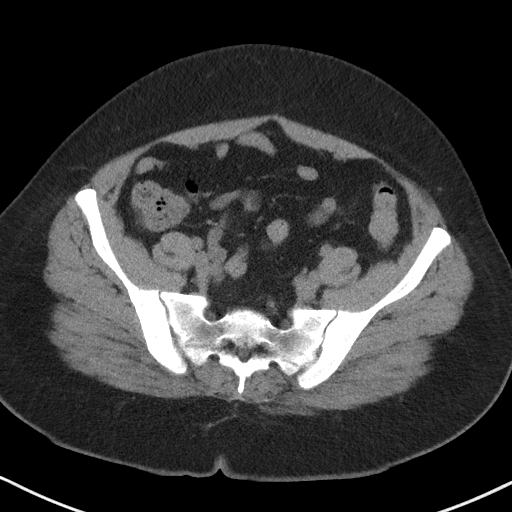
[im 34/91  soft-tissue]
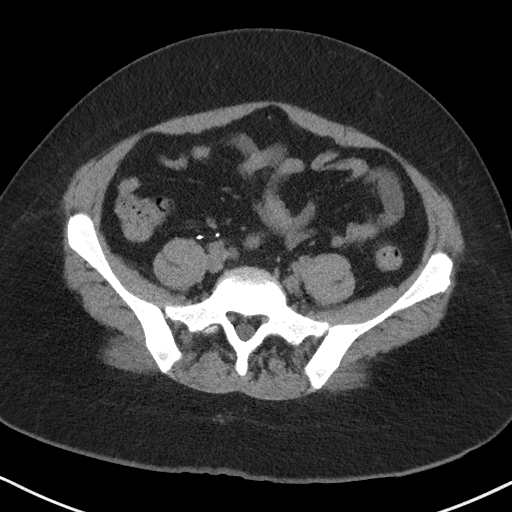
[im 40/91  soft-tissue]
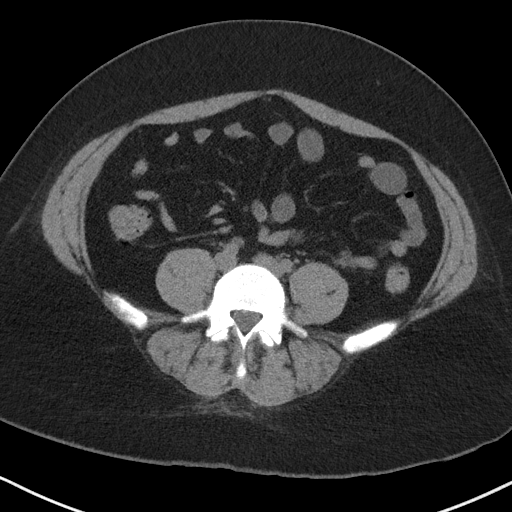
[im 46/91  soft-tissue]
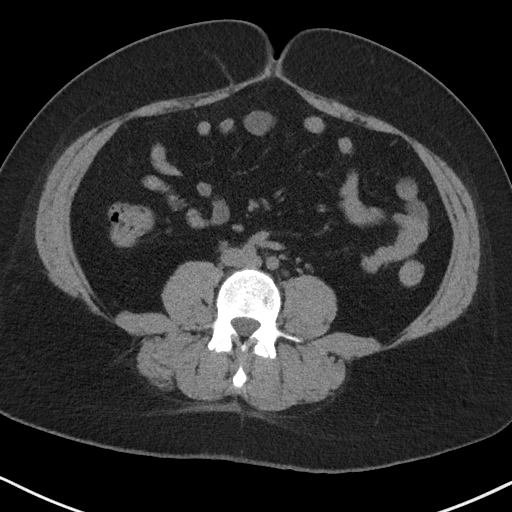
[im 51/91  soft-tissue]
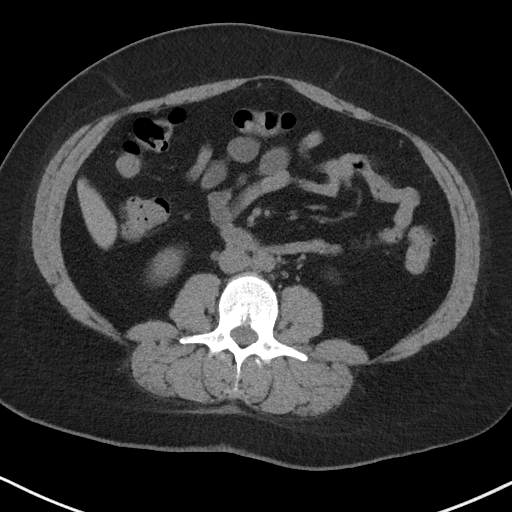
[im 57/91  soft-tissue]
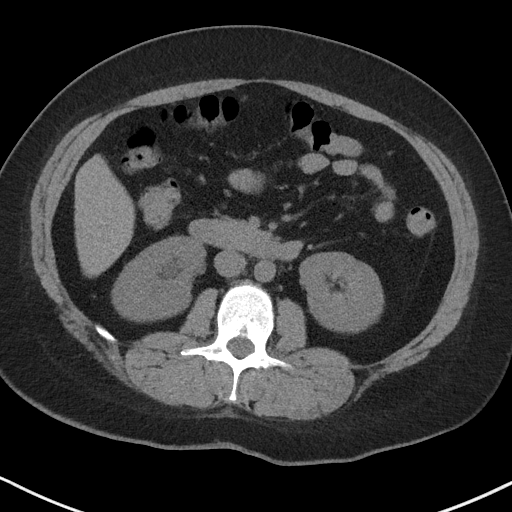
[im 57/91  bone]
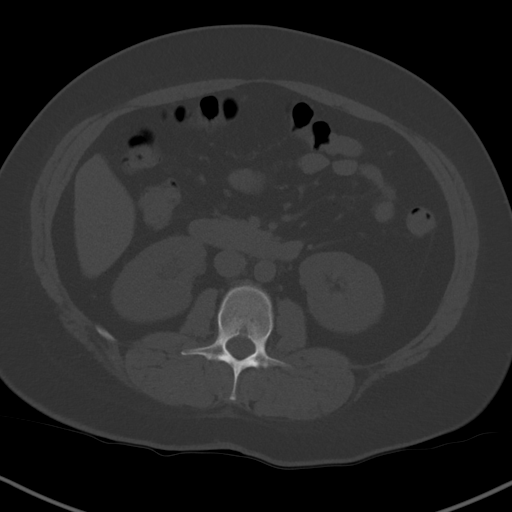
[im 62/91  soft-tissue]
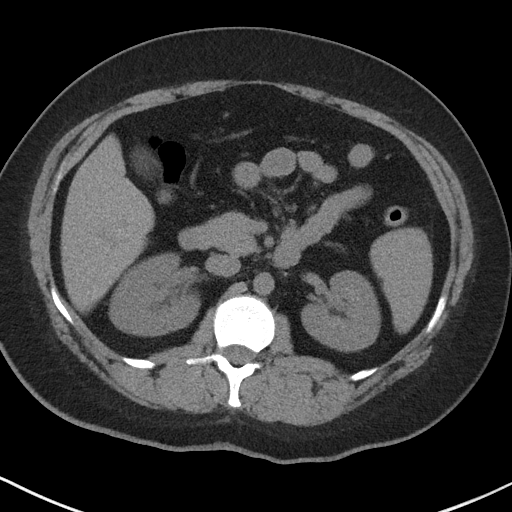
[im 74/91  soft-tissue]
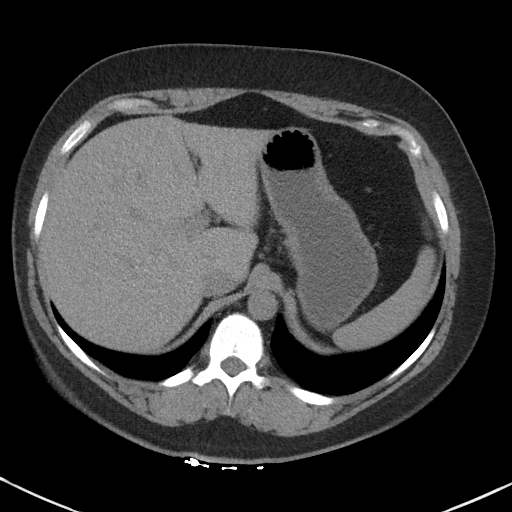
[im 79/91  soft-tissue]
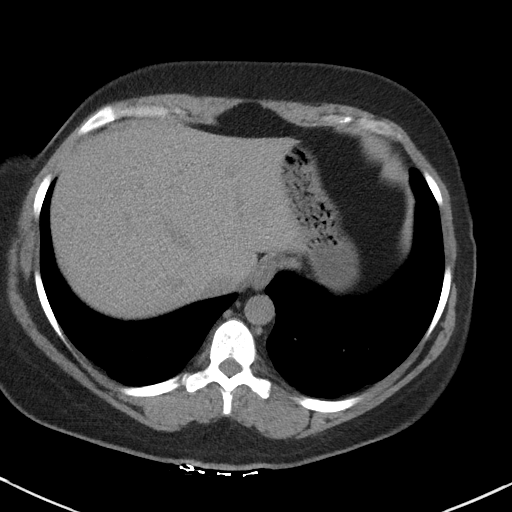
[im 85/91  soft-tissue]
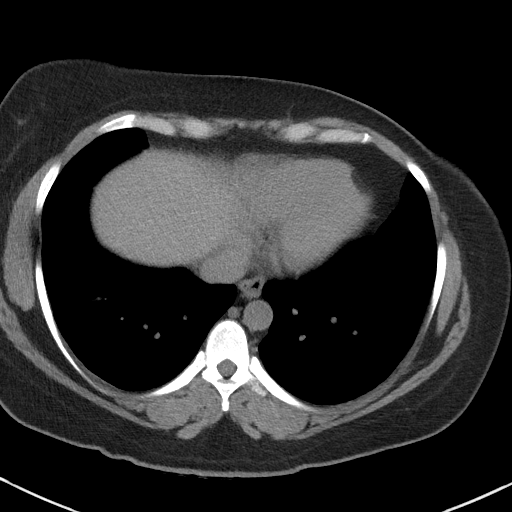

[Series 5: coronal st · coronal · 0.76mm/px · 3 of 85 slices shown]
[im 29/85  soft-tissue]
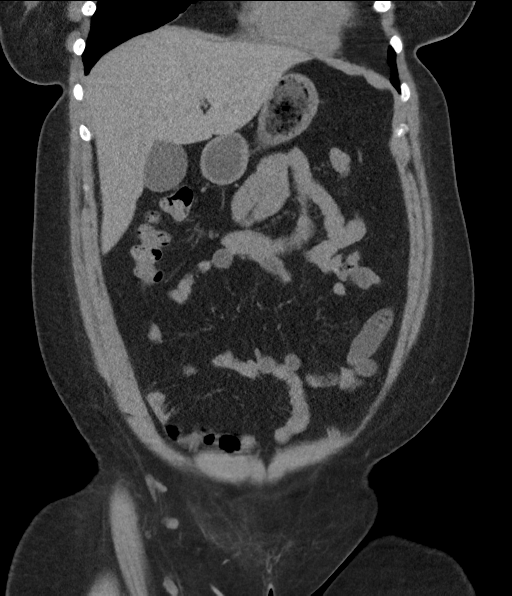
[im 38/85  soft-tissue]
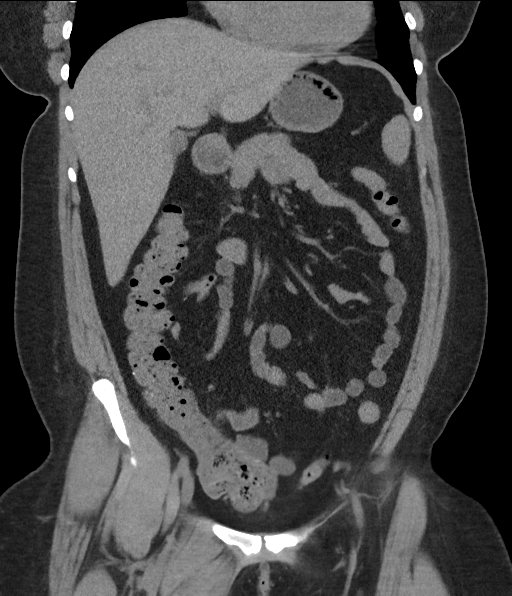
[im 47/85  soft-tissue]
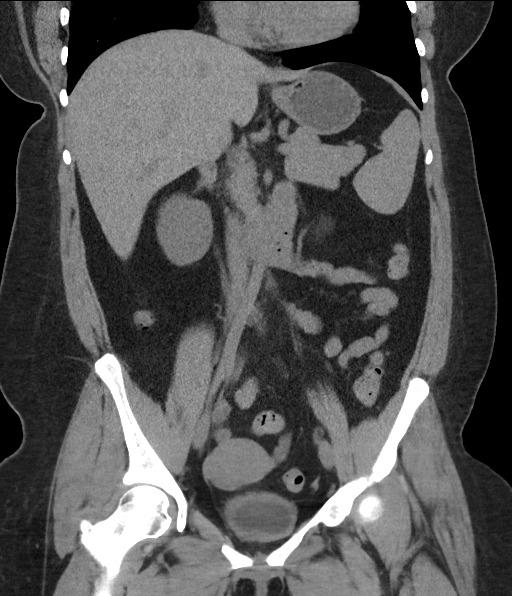

[16 of 46 positions shown; findings below may reference images not displayed]

FINDINGS: Lower chest: No acute abnormality.

Hepatobiliary: No focal liver abnormality is seen. No gallstones,
gallbladder wall thickening, or biliary dilatation.

Pancreas: Unremarkable. No pancreatic ductal dilatation or
surrounding inflammatory changes.

Spleen: Normal in size without focal abnormality.

Adrenals/Urinary Tract: Adrenal glands are unremarkable. Kidneys are
normal in size, without focal lesions. A 3 mm obstructing renal
calculus is seen at the right UVJ (axial CT image 78, CT series 2).
Mild right-sided hydronephrosis is noted. The urinary bladder is
poorly distended and subsequently limited in evaluation.

Stomach/Bowel: Stomach is within normal limits. The appendix is
surgically absent. No evidence of bowel wall thickening, distention,
or inflammatory changes.

Vascular/Lymphatic: No significant vascular findings are present. No
enlarged abdominal or pelvic lymph nodes.

Reproductive: Uterus and bilateral adnexa are unremarkable.

Other: No abdominal wall hernia or abnormality. No abdominopelvic
ascites.

Musculoskeletal: No acute or significant osseous findings.
IMPRESSION: 3 mm obstructing renal calculus at the right UVJ with associated
mild right-sided hydronephrosis.

## 2022-08-28 ENCOUNTER — Encounter: Payer: Self-pay | Admitting: Allergy & Immunology

## 2022-08-31 NOTE — Telephone Encounter (Signed)
I signed it yesterday and gave it to somebody on the [facility] crew.

## 2022-09-05 ENCOUNTER — Other Ambulatory Visit: Payer: Self-pay | Admitting: *Deleted

## 2022-09-05 MED ORDER — FASENRA PEN 30 MG/ML ~~LOC~~ SOAJ: 30.0000 mg | SUBCUTANEOUS | 6 refills | Status: DC

## 2022-09-18 ENCOUNTER — Other Ambulatory Visit: Payer: Self-pay | Admitting: Internal Medicine

## 2022-09-21 ENCOUNTER — Other Ambulatory Visit: Payer: Self-pay | Admitting: Internal Medicine

## 2022-09-21 DIAGNOSIS — K219 Gastro-esophageal reflux disease without esophagitis: Secondary | ICD-10-CM

## 2022-10-10 ENCOUNTER — Ambulatory Visit: Payer: BLUE CROSS/BLUE SHIELD | Admitting: Family Medicine

## 2022-10-24 ENCOUNTER — Ambulatory Visit (HOSPITAL_COMMUNITY): Payer: BLUE CROSS/BLUE SHIELD | Admitting: Psychiatry

## 2022-10-29 ENCOUNTER — Other Ambulatory Visit: Payer: Self-pay | Admitting: Allergy & Immunology

## 2022-11-01 ENCOUNTER — Encounter (HOSPITAL_COMMUNITY): Payer: Self-pay | Admitting: Psychiatry

## 2022-11-01 ENCOUNTER — Ambulatory Visit (HOSPITAL_BASED_OUTPATIENT_CLINIC_OR_DEPARTMENT_OTHER): Payer: BLUE CROSS/BLUE SHIELD | Admitting: Psychiatry

## 2022-11-01 VITALS — BP 130/81 | HR 89 | Ht 62.0 in | Wt 176.0 lb

## 2022-11-01 DIAGNOSIS — J4550 Severe persistent asthma, uncomplicated: Secondary | ICD-10-CM | POA: Diagnosis not present

## 2022-11-01 DIAGNOSIS — F3181 Bipolar II disorder: Secondary | ICD-10-CM | POA: Diagnosis not present

## 2022-11-01 DIAGNOSIS — F325 Major depressive disorder, single episode, in full remission: Secondary | ICD-10-CM

## 2022-11-01 MED ORDER — BUPROPION HCL ER (SR) 200 MG PO TB12: ORAL_TABLET | ORAL | 3 refills | Status: DC

## 2022-11-01 MED ORDER — CLONAZEPAM 1 MG PO TABS
ORAL_TABLET | ORAL | 4 refills | Status: DC
Start: 2022-11-01 — End: 2023-05-04

## 2022-11-01 NOTE — Progress Notes (Addendum)
BH MD/PA/NP OP Progress Note  11/01/2022 4:42 PM Nicole Rodriguez  MRN:  [id number] Interview was conducted in person and I verified that I was speaking with the correct person using two identifiers. I discussed the limitations of evaluation and management by telemedicine and  the availability of in person appointments. Patient expressed understanding and agreed to proceed. Participants in the visit: patient (location - home); physician (location - home office).  Chief Complaint:  Chief Complaint   Follow-up   Panic attacks.  Nicole Rodriguez.  Visit Diagnosis:    Today the patient is seen in the office.  She is doing extremely well.  She has a new relationship whose name is making.  She moved in together to a new house.  The patient still shares custody with the daughter Nicole Rodriguez.  The patient gained a little bit of weight but she still is doing great.  Patient unfortunately is going to have surgery on her left elbow this will keep her out of work for about 2 to 3 months.  The patient still was doing great at work.  They still want a promoter.  She is sleeping well with 1 mg at night and Wellbutrin 200 mg slow release twice a day.  Her appetite is good and her energy level is great.  She is positive and optimistic.  Financially she is very stable.  Besides the issue with her all of her health is generally quite good.  She drinks no alcohol and uses no drugs.  She is functioning extremely well.  I connected with Nicole Rodriguez on 11/01/22 at  4:30 PM EDT by telephone and verified that I am speaking with the correct person using two identifiers.  Location: Patient: home Provider: office   I discussed the limitations, risks, security and privacy concerns of performing an evaluation and management service by telephone and the availability of in person appointments. I also discussed with the patient that there may be a patient responsible charge related to this service. The patient expressed understanding  and agreed to proceed.      I discussed the assessment and treatment plan with the patient. The patient was provided an opportunity to ask questions and all were answered. The patient agreed with the plan and demonstrated an understanding of the instructions.   The patient was advised to call back or seek an in-person evaluation if the symptoms worsen or if the condition fails to improve as anticipated.  I provided 30 minutes of non-face-to-face time during this encounter.   Nicole Balsam, MD  No diagnosis found.  Past Psychiatric History: Please see intake H&P.  Past Medical History:  Past Medical History:  Diagnosis Date   Asthma    Bipolar disorder (HCC)    Generalized anxiety disorder    GERD (gastroesophageal reflux disease)    Phreesia 08/17/2020   IBS (irritable bowel syndrome)    Plantar fasciitis 10/15/2018   Rectal bleeding 09/24/2019   Social anxiety disorder     Past Surgical History:  Procedure Laterality Date   APPENDECTOMY     BREAST LUMPECTOMY     COLONOSCOPY WITH PROPOFOL N/A 12/03/2019   Procedure: COLONOSCOPY WITH PROPOFOL;  Surgeon: Pasty Spillers, MD;  Location: [facility];  Service: Endoscopy;  Laterality: N/A;   CYSTOSCOPY W/ URETERAL STENT PLACEMENT Right 08/31/2021   Procedure: CYSTOSCOPY WITH BILATERAL RETROGRADE PYELOGRAM;  Surgeon: Milderd Meager., MD;  Location: [facility];  Service: Urology;  Laterality: Right;   ESOPHAGOGASTRODUODENOSCOPY (EGD) WITH PROPOFOL N/A 12/03/2019  Procedure: ESOPHAGOGASTRODUODENOSCOPY (EGD) WITH PROPOFOL;  Surgeon: Pasty Spillers, MD;  Location: [facility];  Service: Endoscopy;  Laterality: N/A;   FOOT SURGERY     URETEROSCOPY Right 08/31/2021   Procedure: URETEROSCOPY;  Surgeon: Milderd Meager., MD;  Location: [facility];  Service: Urology;  Laterality: Right;    Family Psychiatric History: Reviewed.  Family History:  Family History  Problem Relation Age of Onset   Allergic rhinitis Father     Asthma Father    Crohn's disease Sister        half sister   Anxiety disorder Maternal Grandmother    Depression Maternal Grandmother    Diverticulitis Paternal Grandmother    Diabetes Other    Heart failure Other    Colon cancer Neg Hx    Celiac disease Neg Hx     Social History:  Social History   Socioeconomic History   Marital status: Legally Separated    Spouse name: Not on file   Number of children: 1   Years of education: Not on file   Highest education level: Not on file  Occupational History   Not on file  Tobacco Use   Smoking status: Never    Passive exposure: Never   Smokeless tobacco: Never  Vaping Use   Vaping Use: Never used  Substance and Sexual Activity   Alcohol use: Yes    Comment: socially    Drug use: No   Sexual activity: Yes    Birth control/protection: None  Other Topics Concern   Not on file  Social History Narrative   Daughter 57 years old.   Social Determinants of Health   Financial Resource Strain: Not on file  Food Insecurity: Not on file  Transportation Needs: Not on file  Physical Activity: Not on file  Stress: Not on file  Social Connections: Not on file    Allergies:  Allergies  Allergen Reactions   Amoxicillin Hives   Asa [Aspirin] Hives   Cefoxitin Itching and Rash   Sulfa Antibiotics Hives    Metabolic Disorder Labs: Lab Results  Component Value Date   HGBA1C 5.2 02/01/2017   No results found for: "PROLACTIN" Lab Results  Component Value Date   CHOL 159 02/01/2017   TRIG 85 02/01/2017   HDL 44 02/01/2017   CHOLHDL 3.6 02/01/2017   LDLCALC 98 02/01/2017   LDLCALC 112 (H) 09/14/2016   Lab Results  Component Value Date   TSH 1.500 09/14/2016    Therapeutic Level Labs: No results found for: "LITHIUM" No results found for: "VALPROATE" No results found for: "CBMZ"  Current Medications: Current Outpatient Medications  Medication Sig Dispense Refill   albuterol (PROVENTIL) (2.5 MG/3ML) 0.083%  nebulizer solution Take 3 mLs (2.5 mg total) by nebulization every 6 (six) hours as needed for wheezing or shortness of breath. 75 mL 3   albuterol (VENTOLIN HFA) 108 (90 Base) MCG/ACT inhaler Inhale 2 puffs into the lungs every 6 (six) hours as needed for wheezing or shortness of breath. 8 g 5   Azelastine HCl 137 MCG/SPRAY SOLN Place 2 sprays into both nostrils 2 (two) times daily as needed. 30 mL 3   famotidine (PEPCID) 40 MG tablet TAKE 1 TABLET BY MOUTH TWICE A DAY 180 tablet 1   FASENRA PEN 30 MG/ML SOAJ Inject 1 mL (30 mg total) into the skin every 8 (eight) weeks. Benralizumab. For severe eosinophilic asthma 1 mL 6   fluticasone (FLONASE) 50 MCG/ACT nasal spray SPRAY 2 SPRAYS INTO EACH NOSTRIL  EVERY DAY 48 mL 1   levocetirizine (XYZAL) 5 MG tablet TAKE 1 TABLET BY MOUTH EVERY DAY IN THE EVENING 30 tablet 5   methocarbamol (ROBAXIN) 500 MG tablet Take 1 tablet (500 mg total) by mouth 2 (two) times daily. 20 tablet 0   montelukast (SINGULAIR) 10 MG tablet Take 1 tablet (10 mg total) by mouth at bedtime. 90 tablet 1   pantoprazole (PROTONIX) 40 MG tablet TAKE 1 TABLET (40 MG TOTAL) BY MOUTH TWICE A DAY BEFORE MEALS 180 tablet 1   Tiotropium Bromide Monohydrate (SPIRIVA RESPIMAT) 1.25 MCG/ACT AERS Inhale 2 puffs into the lungs daily. 1 each 1   benzonatate (TESSALON) 100 MG capsule Take 1 capsule (100 mg total) by mouth 3 (three) times daily as needed. Do not take with alcohol or while driving or operating heavy machinery.  May cause drowsiness. 30 capsule 0   buPROPion (WELLBUTRIN SR) 200 MG 12 hr tablet 1 bid 60 tablet 3   clonazePAM (KLONOPIN) 1 MG tablet 2 qhs 60 tablet 4   fluticasone-salmeterol (ADVAIR DISKUS) 250-50 MCG/ACT AEPB Inhale 1 puff into the lungs in the morning and at bedtime. 60 each 5   methylPREDNISolone (MEDROL DOSEPAK) 4 MG TBPK tablet Use as directed on package. 21 tablet 0   No current facility-administered medications for this visit.      Psychiatric Specialty  Exam: Review of Systems  Psychiatric/Behavioral:  The patient is nervous/anxious.   All other systems reviewed and are negative.   Blood pressure 130/81, pulse 89, height 5\' 2"  (1.575 m), weight 176 lb (79.8 kg).Body mass index is 32.19 kg/m.  General Appearance: NA  Eye Contact:  NA  Speech:  Clear and Coherent and Normal Rate  Volume:  Normal  Mood:  Anxious  Affect:  NA  Thought Process:  Goal Directed  Orientation:  Full (Time, Place, and Person)  Thought Content: Rumination   Suicidal Thoughts:  No  Homicidal Thoughts:  No  Memory:  Immediate;   Good Recent;   Good Remote;   Good  Judgement:  Good  Insight:  Fair  Psychomotor Activity:  NA  Concentration:  Concentration: Fair  Recall:  Good  Fund of Knowledge: Good  Language: Good  Akathisia:  Negative  Handed:  Right  AIMS (if indicated): not done  Assets:  Communication Skills Desire for Improvement Financial Resources/Insurance Housing Social Support Talents/Skills  ADL's:  Intact  Cognition: WNL  Sleep:  Good   Screenings: GAD-7    Flowsheet Row Office Visit from 06/18/2019 in [place] Office Visit from 06/05/2019 in [place] Office Visit from 04/01/2019 in [facility]  Total GAD-7 Score 20 19 0      PHQ2-9    Flowsheet Row Office Visit from 03/08/2022 in [facility] Office Visit from 06/30/2021 in [facility] Video Visit from 11/15/2020 in [facility] Office Visit from 08/20/2020 in [facility] Office Visit from 06/18/2019 in [facility]  PHQ-2 Total Score 0 0 0 0 5  PHQ-9 Total Score 0 -- -- 0 20      Flowsheet Row ED from 08/03/2022 in [facility] [place] ED from 07/24/2022 in [facility] from 07/12/2022 in [facility] RISK CATEGORY No Risk No Risk No Risk        Assessment and  Plan:  This patient's diagnosis is major depression.  She will continue taking Wellbutrin as prescribed.  She also has insomnia and takes 1 mg of Klonopin.  Patient is functioning very well.  She will be seen again in 5 months.   Dx: Bipolar 2 disorder rapid cycling; Mixed anxiety disorder (panic disorder' GAD, social anxiety)   Plan:  Continue Lamictal to 200 mg at HS, Latuda 40 mg at HS, paroxetine 20 mg in PM, trazodone 50 mg prn sleep and bupropion SR 200 mg bid for fatigue. I will stop lorazepam 2 mg prn anxiety (up to tid) and start clonazepam 1 mg bid scheduled initially (then advised to start using it prn after anxiety decreases). We shall write her an excuse from work note for 3/26-4/18/22 period.  Next appointment with a new provider in 1 month. The plan was discussed with patient who had an opportunity to ask questions and these were all answered. I spend 15 minutes in phone consultation with the patient.    Nicole Balsam, MD 11/01/2022, 4:42 PM

## 2022-11-07 HISTORY — PX: OTHER SURGICAL HISTORY: SHX169

## 2022-11-14 ENCOUNTER — Ambulatory Visit (HOSPITAL_COMMUNITY): Payer: BLUE CROSS/BLUE SHIELD | Admitting: Psychiatry

## 2022-11-30 NOTE — Patient Instructions (Addendum)
Asthma with acute exacerbation Continue Advair 250-1 puff twice a day to prevent cough or wheeze. Pre-treat with albuterol before using Advair for the next few days Continue albuterol 2 puffs every 4 hours as needed for cough or wheeze OR Instead use albuterol 0.083% solution via nebulizer one unit vial every 4 hours as needed for cough or wheeze Continue Fasenra once every 8 weeks for asthma control  Allergic rhinitis Continue avoidance measures directed toward grass pollen as listed below Continue Xyzal 5 mg once a day as needed for a runny nose or itch Continue Flonase 2 sprays in each nostril once a day as needed for a stuffy nose Continue azelastine 2 sprays in each nostril twice a day as needed for a runny nose Consider saline nasal rinses as needed for nasal symptoms. Use this before any medicated nasal sprays for best result  Reflux Continue dietary and lifestyle modifications as listed below Continue pantoprazole 40 mg once a day as previously prescribed. Take this medication 30 minutes before your first meal for best results  Call the clinic if this treatment plan is not working well for you  Follow up in the clinic in 6 months or sooner if needed.  Reducing Pollen Exposure The American Academy of Allergy, Asthma and Immunology suggests the following steps to reduce your exposure to pollen during allergy seasons. Do not hang sheets or clothing out to dry; pollen may collect on these items. Do not mow lawns or spend time around freshly cut grass; mowing stirs up pollen. Keep windows closed at night.  Keep car windows closed while driving. Minimize morning activities outdoors, a time when pollen counts are usually at their highest. Stay indoors as much as possible when pollen counts or humidity is high and on windy days when pollen tends to remain in the air longer. Use air conditioning when possible.  Many air conditioners have filters that trap the pollen spores. Use a HEPA room  air filter to remove pollen form the indoor air you breathe.

## 2022-11-30 NOTE — Progress Notes (Signed)
[address] [place] [address] Dept: [phone]  FOLLOW UP NOTE  Patient ID: Nicole Rodriguez, female    DOB: 1991-05-13  Age: 32 y.o. MRN: [id number] Date of Office Visit: 12/01/2022  Assessment  Chief Complaint: Asthma (Doing good)  HPI Nicole Rodriguez is a 32 year old female who presents to the clinic for follow-up visit.  She was last seen in this clinic on 07/26/2022 by Thermon Leyland, via televisit for evaluation of asthma with acute exacerbation requiring prednisone, allergic rhinitis, and reflux.    At today's visit, she reports her asthma has been well-controlled with no shortness of breath, cough, or wheeze with activity or rest.  She continues montelukast 10 mg once a day, Advair 250-1 puff twice a day and rarely needs to use albuterol for relief of asthma symptoms.  She has not used Spiriva since her last visit to this clinic.  She continues Fasenra injections once every 8 weeks with no large or local reactions.  She reports a significant decrease in her symptoms of asthma while continuing on Fasenra injections.  Allergic rhinitis is reported as well-controlled with no symptoms including rhinorrhea or nasal congestion while using medications.  She continues Xyzal 5 mg once a day and Flonase 2 sprays in each nostril once a day.  She is not currently using a nasal saline rinse.  She continues azelastine only as needed which is infrequently.  Her last environmental allergy testing was on 07/13/2021 and was positive to grass pollens.  Reflux is reported as well-controlled with no symptoms including heartburn or vomiting.  She continues pantoprazole daily with relief of symptoms.  Her current medications are listed in the chart.   Drug Allergies:  Allergies  Allergen Reactions   Amoxicillin Hives   Asa [Aspirin] Hives   Cefoxitin Itching and Rash   Sulfa Antibiotics Hives    Physical Exam: BP 126/70 (BP Location: Left Arm, Patient Position: Sitting, Cuff Size:  Normal)   Pulse 87   Temp 98.2 F (36.8 C) (Temporal)   Resp 18   SpO2 97%    Physical Exam Vitals reviewed.  Constitutional:      Appearance: Normal appearance.  HENT:     Head: Normocephalic and atraumatic.     Right Ear: Tympanic membrane normal.     Left Ear: Tympanic membrane normal.     Nose:     Comments: Bilateral nares slightly erythematous with clear nasal drainage noted.  Pharynx normal.  Ears normal.  Eyes normal.    Mouth/Throat:     Pharynx: Oropharynx is clear.  Eyes:     Conjunctiva/sclera: Conjunctivae normal.  Cardiovascular:     Rate and Rhythm: Normal rate and regular rhythm.     Heart sounds: Normal heart sounds. No murmur heard. Pulmonary:     Effort: Pulmonary effort is normal.     Breath sounds: Normal breath sounds.     Comments: Lungs clear to auscultation Musculoskeletal:        General: Normal range of motion.     Cervical back: Normal range of motion and neck supple.  Skin:    General: Skin is warm and dry.  Neurological:     Mental Status: She is alert and oriented to person, place, and time.  Psychiatric:        Mood and Affect: Mood normal.        Behavior: Behavior normal.        Thought Content: Thought content normal.  Judgment: Judgment normal.     Diagnostics: FVC 3.67 which is 100% of predicted value.  FEV1 3.00 which is 98% of predicted value.  Spirometry indicates normal ventilatory function.  Assessment and Plan: 1. Moderate persistent asthma, unspecified whether complicated   2. Other seasonal allergic rhinitis   3. Gastroesophageal reflux disease, unspecified whether esophagitis present   4. Mild intermittent asthma without complication   5. Gastroesophageal reflux disease without esophagitis     Meds ordered this encounter  Medications   albuterol (PROVENTIL) (2.5 MG/3ML) 0.083% nebulizer solution    Sig: Take 3 mLs (2.5 mg total) by nebulization every 6 (six) hours as needed for wheezing or shortness of  breath.    Dispense:  75 mL    Refill:  3   albuterol (VENTOLIN HFA) 108 (90 Base) MCG/ACT inhaler    Sig: Inhale 2 puffs into the lungs every 6 (six) hours as needed for wheezing or shortness of breath.    Dispense:  8 g    Refill:  5    Okay to substitute to generic/formulary Albuterol.   Azelastine HCl 137 MCG/SPRAY SOLN    Sig: Place 2 sprays into both nostrils 2 (two) times daily as needed.    Dispense:  30 mL    Refill:  3   fluticasone (FLONASE) 50 MCG/ACT nasal spray    Sig: SPRAY 2 SPRAYS INTO EACH NOSTRIL EVERY DAY    Dispense:  16 g    Refill:  5   fluticasone-salmeterol (ADVAIR DISKUS) 250-50 MCG/ACT AEPB    Sig: Inhale 1 puff into the lungs in the morning and at bedtime.    Dispense:  1 each    Refill:  5   levocetirizine (XYZAL) 5 MG tablet    Sig: Take 1 tablet (5 mg total) by mouth daily as needed for allergies.    Dispense:  30 tablet    Refill:  5   montelukast (SINGULAIR) 10 MG tablet    Sig: Take 1 tablet (10 mg total) by mouth at bedtime.    Dispense:  90 tablet    Refill:  1    Patient Instructions  Asthma with acute exacerbation Continue Advair 250-1 puff twice a day to prevent cough or wheeze. Pre-treat with albuterol before using Advair for the next few days Continue albuterol 2 puffs every 4 hours as needed for cough or wheeze OR Instead use albuterol 0.083% solution via nebulizer one unit vial every 4 hours as needed for cough or wheeze Continue Fasenra once every 8 weeks for asthma control  Allergic rhinitis Continue avoidance measures directed toward grass pollen as listed below Continue Xyzal 5 mg once a day as needed for a runny nose or itch Continue Flonase 2 sprays in each nostril once a day as needed for a stuffy nose Continue azelastine 2 sprays in each nostril twice a day as needed for a runny nose Consider saline nasal rinses as needed for nasal symptoms. Use this before any medicated nasal sprays for best result  Reflux Continue  dietary and lifestyle modifications as listed below Continue pantoprazole 40 mg once a day as previously prescribed. Take this medication 30 minutes before your first meal for best results  Call the clinic if this treatment plan is not working well for you  Follow up in the clinic in 6 months or sooner if needed.   Return in about 6 months (around 06/02/2023), or if symptoms worsen or fail to improve.    Thank you for  the opportunity to care for this patient.  Please do not hesitate to contact me with questions.  Thermon Leyland, FNP [facility]

## 2022-12-01 ENCOUNTER — Ambulatory Visit (INDEPENDENT_AMBULATORY_CARE_PROVIDER_SITE_OTHER): Payer: BLUE CROSS/BLUE SHIELD | Admitting: Family Medicine

## 2022-12-01 ENCOUNTER — Encounter: Payer: Self-pay | Admitting: Family Medicine

## 2022-12-01 VITALS — BP 126/70 | HR 87 | Temp 98.2°F | Resp 18

## 2022-12-01 DIAGNOSIS — J4520 Mild intermittent asthma, uncomplicated: Secondary | ICD-10-CM

## 2022-12-01 DIAGNOSIS — J4540 Moderate persistent asthma, uncomplicated: Secondary | ICD-10-CM

## 2022-12-01 DIAGNOSIS — J302 Other seasonal allergic rhinitis: Secondary | ICD-10-CM

## 2022-12-01 DIAGNOSIS — K219 Gastro-esophageal reflux disease without esophagitis: Secondary | ICD-10-CM

## 2022-12-01 MED ORDER — ALBUTEROL SULFATE (2.5 MG/3ML) 0.083% IN NEBU: 2.5000 mg | INHALATION_SOLUTION | Freq: Four times a day (QID) | RESPIRATORY_TRACT | 3 refills | Status: DC | PRN

## 2022-12-01 MED ORDER — FLUTICASONE PROPIONATE 50 MCG/ACT NA SUSP: NASAL | 5 refills | Status: DC

## 2022-12-01 MED ORDER — LEVOCETIRIZINE DIHYDROCHLORIDE 5 MG PO TABS: 5.0000 mg | ORAL_TABLET | Freq: Every day | ORAL | 5 refills | Status: DC | PRN

## 2022-12-01 MED ORDER — FLUTICASONE-SALMETEROL 250-50 MCG/ACT IN AEPB: 1.0000 | INHALATION_SPRAY | Freq: Two times a day (BID) | RESPIRATORY_TRACT | 5 refills | Status: DC

## 2022-12-01 MED ORDER — ALBUTEROL SULFATE HFA 108 (90 BASE) MCG/ACT IN AERS: 2.0000 | INHALATION_SPRAY | Freq: Four times a day (QID) | RESPIRATORY_TRACT | 5 refills | Status: DC | PRN

## 2022-12-01 MED ORDER — AZELASTINE HCL 137 MCG/SPRAY NA SOLN: 2.0000 | Freq: Two times a day (BID) | NASAL | 3 refills | Status: DC | PRN

## 2022-12-01 MED ORDER — MONTELUKAST SODIUM 10 MG PO TABS: 10.0000 mg | ORAL_TABLET | Freq: Every day | ORAL | 1 refills | Status: DC

## 2022-12-04 ENCOUNTER — Other Ambulatory Visit: Payer: Self-pay | Admitting: *Deleted

## 2022-12-04 ENCOUNTER — Other Ambulatory Visit (HOSPITAL_COMMUNITY): Payer: Self-pay

## 2022-12-04 ENCOUNTER — Telehealth: Payer: Self-pay

## 2022-12-04 MED ORDER — ALBUTEROL SULFATE HFA 108 (90 BASE) MCG/ACT IN AERS: 2.0000 | INHALATION_SPRAY | Freq: Four times a day (QID) | RESPIRATORY_TRACT | 1 refills | Status: DC | PRN

## 2022-12-04 NOTE — Telephone Encounter (Signed)
Patient Advocate Encounter   Received notification from [employer] that prior authorization is required for Albuterol Sulfate HFA 108 (90 Base)MCG/ACT aerosol   Submitted: 12-04-2022 Key [id number]  PA not submitted at this time.

## 2022-12-04 NOTE — Telephone Encounter (Signed)
New prescription has been sent in.  

## 2022-12-22 ENCOUNTER — Other Ambulatory Visit (HOSPITAL_COMMUNITY): Payer: Self-pay | Admitting: Psychiatry

## 2022-12-26 ENCOUNTER — Other Ambulatory Visit (HOSPITAL_COMMUNITY): Payer: Self-pay | Admitting: Psychiatry

## 2022-12-26 MED ORDER — BUPROPION HCL ER (SR) 200 MG PO TB12: ORAL_TABLET | ORAL | 3 refills | Status: DC

## 2023-01-03 ENCOUNTER — Ambulatory Visit: Payer: BLUE CROSS/BLUE SHIELD

## 2023-01-04 ENCOUNTER — Ambulatory Visit: Payer: BLUE CROSS/BLUE SHIELD

## 2023-01-17 ENCOUNTER — Ambulatory Visit: Payer: BLUE CROSS/BLUE SHIELD

## 2023-02-13 ENCOUNTER — Encounter: Payer: Self-pay | Admitting: Allergy & Immunology

## 2023-02-16 ENCOUNTER — Encounter (HOSPITAL_COMMUNITY): Payer: Self-pay

## 2023-02-16 ENCOUNTER — Other Ambulatory Visit: Payer: Self-pay

## 2023-02-16 ENCOUNTER — Emergency Department (HOSPITAL_COMMUNITY)
Admission: EM | Admit: 2023-02-16 | Discharge: 2023-02-16 | Disposition: A | Discharge status: Home / Self Care | Payer: BLUE CROSS/BLUE SHIELD | Source: Home / Self Care | Attending: Emergency Medicine | Admitting: Emergency Medicine

## 2023-02-16 DIAGNOSIS — M542 Cervicalgia: Secondary | ICD-10-CM | POA: Diagnosis not present

## 2023-02-16 MED ORDER — KETOROLAC TROMETHAMINE 60 MG/2ML IM SOLN
60.0000 mg | Freq: Once | INTRAMUSCULAR | Status: AC
  Administered 2023-02-16: 60 mg via INTRAMUSCULAR
  Filled 2023-02-16: qty 2

## 2023-02-16 MED ORDER — METHYLPREDNISOLONE 4 MG PO TBPK: ORAL_TABLET | ORAL | 0 refills | Status: DC

## 2023-02-16 MED ORDER — PREDNISONE 20 MG PO TABS
60.0000 mg | ORAL_TABLET | Freq: Once | ORAL | Status: AC
  Administered 2023-02-16: 60 mg via ORAL
  Filled 2023-02-16: qty 3

## 2023-02-16 NOTE — Telephone Encounter (Signed)
Form has been filled out. Patient has been called and made aware. I informed to bring $10 at pick up.

## 2023-02-16 NOTE — Discharge Instructions (Addendum)
Continue taking all her medications as prescribed.  You can also begin taking the Medrol Dosepak.  Please follow-up with your spine orthopedic doctor.

## 2023-02-16 NOTE — ED Provider Notes (Signed)
[place] [facility] AT [facility] Provider Note   CSN: [id number] Arrival date & time: 02/16/23  0754     History  Chief Complaint  Patient presents with   Neck Pain    Nicole Rodriguez is a 32 y.o. female.  32 year old female here today for acute on chronic neck pain.  Patient says that they first hurt her neck in January of this year when they were throwing wood.  They have since followed up with orthopedic spine physician, who has prescribed oxycodone, gabapentin and Flexeril.  Patient is currently scheduled to receive C5-C6 injections.  Comes in today because over the last 3 days has had increased pain in their neck.  There has been no new trauma to the area.   Neck Pain      Home Medications Prior to Admission medications   Medication Sig Start Date End Date Taking? Authorizing Provider  methylPREDNISolone (MEDROL DOSEPAK) 4 MG TBPK tablet Bottle: 21 each  Box: 21 each  Disp Pack: 21 each 02/16/23  Yes Anders Simmonds T, DO  albuterol (PROVENTIL) (2.5 MG/3ML) 0.083% nebulizer solution Take 3 mLs (2.5 mg total) by nebulization every 6 (six) hours as needed for wheezing or shortness of breath. 12/01/22   Hetty Blend, FNP  albuterol (VENTOLIN HFA) 108 (90 Base) MCG/ACT inhaler Inhale 2 puffs into the lungs every 6 (six) hours as needed for wheezing or shortness of breath. 12/04/22   Ambs, Norvel Richards, FNP  Azelastine HCl 137 MCG/SPRAY SOLN Place 2 sprays into both nostrils 2 (two) times daily as needed. 12/01/22   Hetty Blend, FNP  benzonatate (TESSALON) 100 MG capsule Take 1 capsule (100 mg total) by mouth 3 (three) times daily as needed. Do not take with alcohol or while driving or operating heavy machinery.  May cause drowsiness. 07/24/22   Valentino Nose, NP  buPROPion [facility] SR) 200 MG 12 hr tablet 1 bid 12/26/22   Plovsky, Earvin Hansen, MD  clonazePAM [facility]) 1 MG tablet 2 qhs 11/01/22   Plovsky, Earvin Hansen, MD  doxycycline (VIBRAMYCIN) 100 MG capsule Take  100 mg by mouth 2 (two) times daily. 11/07/22   [provider]  famotidine (PEPCID) 40 MG tablet TAKE 1 TABLET BY MOUTH TWICE A DAY 04/12/22   Nehemiah Settle, FNP  FASENRA PEN 30 MG/ML SOAJ Inject 1 mL (30 mg total) into the skin every 8 (eight) weeks. Benralizumab. For severe eosinophilic asthma 09/05/22   Alfonse Spruce, MD  fluticasone [facility]) 50 MCG/ACT nasal spray SPRAY 2 SPRAYS INTO EACH NOSTRIL EVERY DAY 12/01/22   Ambs, Norvel Richards, FNP  fluticasone-salmeterol (ADVAIR DISKUS) 250-50 MCG/ACT AEPB Inhale 1 puff into the lungs in the morning and at bedtime. 12/01/22   Hetty Blend, FNP  gabapentin (NEURONTIN) 300 MG capsule Take 300 mg by mouth 3 (three) times daily. 09/26/22   [provider]  levocetirizine (XYZAL) 5 MG tablet Take 1 tablet (5 mg total) by mouth daily as needed for allergies. 12/01/22   Hetty Blend, FNP  meloxicam (MOBIC) 7.5 MG tablet Take 7.5 mg by mouth 2 (two) times daily. 10/02/22   [provider]  methocarbamol (ROBAXIN) 500 MG tablet Take 1 tablet (500 mg total) by mouth 2 (two) times daily. 07/12/22   Leath-Warren, Sadie Haber, NP  montelukast (SINGULAIR) 10 MG tablet Take 1 tablet (10 mg total) by mouth at bedtime. 12/01/22   Hetty Blend, FNP  ondansetron (ZOFRAN-ODT) 8 MG disintegrating tablet Take 8 mg by mouth  every 8 (eight) hours as needed for refractory nausea / vomiting. 11/07/22   [provider]  oxyCODONE (OXY IR/ROXICODONE) 5 MG immediate release tablet Take 5 mg by mouth every 4 (four) hours as needed for breakthrough pain. 11/20/22   [provider]  pantoprazole (PROTONIX) 40 MG tablet TAKE 1 TABLET (40 MG TOTAL) BY MOUTH TWICE A DAY BEFORE MEALS 09/21/22   Anabel Halon, MD  tiZANidine (ZANAFLEX) 4 MG tablet Take 4 mg by mouth every 6 (six) hours as needed for muscle spasms. 10/02/22   [provider]      Allergies    Amoxicillin, Asa [aspirin], Cefoxitin, and Sulfa antibiotics    Review of Systems    Review of Systems  Musculoskeletal:  Positive for neck pain.    Physical Exam Updated Vital Signs BP 115/83   Pulse 96   Temp 98 F (36.7 C)   Resp 18   LMP 02/07/2023   SpO2 100%  Physical Exam Vitals reviewed.  Constitutional:      Appearance: Normal appearance.  Musculoskeletal:     Cervical back: Neck supple.  Neurological:     General: No focal deficit present.     Mental Status: She is alert.     Comments: 5/5 bilateral upper extremity strength.  No diminished sensation.     ED Results / Procedures / Treatments   Labs (all labs ordered are listed, but only abnormal results are displayed) Labs Reviewed - No data to display  EKG None  Radiology No results found.  Procedures Procedures    Medications Ordered in ED Medications  ketorolac (TORADOL) injection 60 mg (60 mg Intramuscular Given 02/16/23 0924)  predniSONE (DELTASONE) tablet 60 mg (60 mg Oral Given 02/16/23 5188)    ED Course/ Medical Decision Making/ A&P                                 Medical Decision Making 32 year old female here today with neck pain.  Differential diagnoses include acute neck pain, less likely acute infectious process, less likely traumatic process.  Plan- patient has had CT scans and MRIs on this neck.  She is currently following with specialist for this pain.  Believe this is an acute exacerbation of her chronic pain.  Will attempt to treat the patient here with steroids and Toradol.\  Reassessment-patient's pain is modestly improved with Toradol.  Will discharge with Medrol Dosepak.  Will have patient follow-up with her spine doctor.           Final Clinical Impression(s) / ED Diagnoses Final diagnoses:  Neck pain    Rx / DC Orders ED Discharge Orders          Ordered    methylPREDNISolone (MEDROL DOSEPAK) 4 MG TBPK tablet        02/16/23 1001              Anders Simmonds T, DO 02/16/23 1002

## 2023-02-16 NOTE — ED Triage Notes (Signed)
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

## 2023-03-21 NOTE — Telephone Encounter (Signed)
The forms have been picked up and the 10$ fee has been collected.

## 2023-04-03 ENCOUNTER — Ambulatory Visit (HOSPITAL_COMMUNITY): Payer: BLUE CROSS/BLUE SHIELD | Admitting: Psychiatry

## 2023-05-01 ENCOUNTER — Ambulatory Visit (HOSPITAL_COMMUNITY): Payer: BLUE CROSS/BLUE SHIELD | Admitting: Psychiatry

## 2023-05-01 ENCOUNTER — Ambulatory Visit: Payer: BLUE CROSS/BLUE SHIELD

## 2023-05-04 ENCOUNTER — Other Ambulatory Visit (HOSPITAL_COMMUNITY): Payer: Self-pay

## 2023-05-04 ENCOUNTER — Encounter (HOSPITAL_COMMUNITY): Payer: Self-pay

## 2023-05-04 ENCOUNTER — Ambulatory Visit (HOSPITAL_COMMUNITY): Payer: BLUE CROSS/BLUE SHIELD | Admitting: Psychiatry

## 2023-05-04 DIAGNOSIS — F3181 Bipolar II disorder: Secondary | ICD-10-CM

## 2023-05-04 MED ORDER — CLONAZEPAM 1 MG PO TABS
ORAL_TABLET | ORAL | 1 refills | Status: DC
Start: 2023-05-04 — End: 2023-07-31

## 2023-05-09 ENCOUNTER — Encounter: Payer: Self-pay | Admitting: Allergy & Immunology

## 2023-05-09 DIAGNOSIS — K219 Gastro-esophageal reflux disease without esophagitis: Secondary | ICD-10-CM

## 2023-05-09 NOTE — Telephone Encounter (Signed)
Patient is requesting pantoprazole 40mg  . Please advise if okay to send in. It was last sent in 09/21/22 by Trena Platt K,MD. Patient has an upcoming appointment on 06-08-23.

## 2023-05-14 MED ORDER — PANTOPRAZOLE SODIUM 40 MG PO TBEC: 40.0000 mg | DELAYED_RELEASE_TABLET | Freq: Every day | ORAL | 0 refills | Status: DC

## 2023-05-14 NOTE — Telephone Encounter (Signed)
Ok to refill pantoprazole 40 mg once a day. Has an appointment with Dr. Dellis Anes in December. Thank you

## 2023-05-14 NOTE — Addendum Note (Signed)
Addended by: Elsworth Soho on: 05/14/2023 01:36 PM   Modules accepted: Orders

## 2023-05-14 NOTE — Addendum Note (Signed)
Addended by: Elsworth Soho on: 05/14/2023 01:37 PM   Modules accepted: Orders

## 2023-06-06 ENCOUNTER — Encounter (HOSPITAL_COMMUNITY): Payer: Self-pay

## 2023-06-06 ENCOUNTER — Ambulatory Visit (HOSPITAL_COMMUNITY): Payer: BLUE CROSS/BLUE SHIELD | Admitting: Psychiatry

## 2023-06-08 ENCOUNTER — Ambulatory Visit: Payer: BLUE CROSS/BLUE SHIELD | Admitting: Allergy & Immunology

## 2023-06-11 ENCOUNTER — Ambulatory Visit: Payer: BLUE CROSS/BLUE SHIELD | Admitting: Internal Medicine

## 2023-06-11 DIAGNOSIS — J309 Allergic rhinitis, unspecified: Secondary | ICD-10-CM

## 2023-06-30 ENCOUNTER — Other Ambulatory Visit: Payer: Self-pay | Admitting: Family Medicine

## 2023-07-16 ENCOUNTER — Other Ambulatory Visit: Payer: Self-pay | Admitting: Family Medicine

## 2023-07-24 ENCOUNTER — Ambulatory Visit
Admission: RE | Admit: 2023-07-24 | Discharge: 2023-07-24 | Disposition: A | Discharge status: Home / Self Care | Payer: Self-pay | Source: Ambulatory Visit | Attending: Family Medicine | Admitting: Family Medicine

## 2023-07-24 VITALS — BP 124/84 | HR 87 | Temp 98.3°F | Resp 20

## 2023-07-24 DIAGNOSIS — G43809 Other migraine, not intractable, without status migrainosus: Secondary | ICD-10-CM

## 2023-07-24 MED ORDER — SUMATRIPTAN SUCCINATE 50 MG PO TABS: ORAL_TABLET | ORAL | 0 refills | Status: DC

## 2023-07-24 MED ORDER — ONDANSETRON 4 MG PO TBDP
4.0000 mg | ORAL_TABLET | Freq: Once | ORAL | Status: AC
  Administered 2023-07-24: 4 mg via ORAL

## 2023-07-24 MED ORDER — KETOROLAC TROMETHAMINE 30 MG/ML IJ SOLN
30.0000 mg | Freq: Once | INTRAMUSCULAR | Status: AC
  Administered 2023-07-24: 30 mg via INTRAMUSCULAR

## 2023-07-24 MED ORDER — ONDANSETRON 4 MG PO TBDP: 4.0000 mg | ORAL_TABLET | Freq: Three times a day (TID) | ORAL | 0 refills | Status: DC | PRN

## 2023-07-24 NOTE — ED Provider Notes (Signed)
[facility]    CSN: [id number] Arrival date & time: 07/24/23  1218      History   Chief Complaint Chief Complaint  Patient presents with   Migraine    Entered by patient    HPI Nicole Rodriguez is a 33 y.o. female.   Patient presenting today with new onset frontal migraine that started this morning around 6 AM.  Nicole Rodriguez states Nicole Rodriguez has some associated photophobia, phonophobia, nausea, vomiting.  History of migraines, last one was about a year ago per patient.  States this feels very consistent.  Denies visual change, mental status change, dizziness, chest pain, shortness of breath, palpitations, recent head injury.  Trying Tylenol ibuprofen with no relief.    Past Medical History:  Diagnosis Date   Asthma    Bipolar disorder (HCC)    Generalized anxiety disorder    GERD (gastroesophageal reflux disease)    [employer] 08/17/2020   IBS (irritable bowel syndrome)    Plantar fasciitis 10/15/2018   Rectal bleeding 09/24/2019   Social anxiety disorder     Patient Active Problem List   Diagnosis Date Noted   Severe persistent asthma, uncomplicated 11/01/2022   Viral URI 07/26/2022   Intermittent palpitations 08/20/2020   Gastric polyp    GAD (generalized anxiety disorder) 07/19/2019   Social anxiety disorder 07/19/2019   Panic disorder 07/19/2019   Bipolar 2 disorder (HCC) 07/19/2019   Other seasonal allergic rhinitis 08/19/2018   GERD (gastroesophageal reflux disease) 08/19/2018   Asthma 11/29/2017   Breast mass, right 03/18/2014   Numbness and tingling in both hands 01/26/2014    Past Surgical History:  Procedure Laterality Date   APPENDECTOMY     BREAST LUMPECTOMY     COLONOSCOPY WITH PROPOFOL N/A 12/03/2019   Procedure: COLONOSCOPY WITH PROPOFOL;  Surgeon: Pasty Spillers, MD;  Location: [facility];  Service: Endoscopy;  Laterality: N/A;   CYSTOSCOPY W/ URETERAL STENT PLACEMENT Right 08/31/2021   Procedure: CYSTOSCOPY WITH BILATERAL RETROGRADE  PYELOGRAM;  Surgeon: Milderd Meager., MD;  Location: [facility];  Service: Urology;  Laterality: Right;   ESOPHAGOGASTRODUODENOSCOPY (EGD) WITH PROPOFOL N/A 12/03/2019   Procedure: ESOPHAGOGASTRODUODENOSCOPY (EGD) WITH PROPOFOL;  Surgeon: Pasty Spillers, MD;  Location: [facility];  Service: Endoscopy;  Laterality: N/A;   eunlar relocation Left 11/07/2022   FOOT SURGERY     URETEROSCOPY Right 08/31/2021   Procedure: URETEROSCOPY;  Surgeon: Milderd Meager., MD;  Location: [facility];  Service: Urology;  Laterality: Right;    OB History     Gravida  0   Para  0   Term  0   Preterm  0   AB  0   Living  0      SAB  0   IAB  0   Ectopic  0   Multiple  0   Live Births  0            Home Medications    Prior to Admission medications   Medication Sig Start Date End Date Taking? Authorizing Provider  ondansetron (ZOFRAN-ODT) 4 MG disintegrating tablet Take 1 tablet (4 mg total) by mouth every 8 (eight) hours as needed for nausea or vomiting. 07/24/23  Yes Particia Nearing, PA-C  SUMAtriptan (IMITREX) 50 MG tablet Take 1 tab at onset of migraine. May repeat in 2 hours if headache persists or recurs. Max of 2 tabs daily 07/24/23  Yes Particia Nearing, PA-C  albuterol (PROVENTIL) (2.5 MG/3ML) 0.083% nebulizer solution Take 3 mLs (  2.5 mg total) by nebulization every 6 (six) hours as needed for wheezing or shortness of breath. 12/01/22   Hetty Blend, FNP  albuterol (VENTOLIN HFA) 108 (90 Base) MCG/ACT inhaler Inhale 2 puffs into the lungs every 6 (six) hours as needed for wheezing or shortness of breath. 12/04/22   Ambs, Norvel Richards, FNP  Azelastine HCl 137 MCG/SPRAY SOLN Place 2 sprays into both nostrils 2 (two) times daily as needed. 12/01/22   Hetty Blend, FNP  benzonatate (TESSALON) 100 MG capsule Take 1 capsule (100 mg total) by mouth 3 (three) times daily as needed. Do not take with alcohol or while driving or operating heavy machinery.  May cause drowsiness.  07/24/22   Valentino Nose, NP  buPROPion [facility] SR) 200 MG 12 hr tablet 1 bid 12/26/22   Plovsky, Earvin Hansen, MD  clonazePAM [facility]) 1 MG tablet 2 qhs 05/04/23   Plovsky, Earvin Hansen, MD  doxycycline (VIBRAMYCIN) 100 MG capsule Take 100 mg by mouth 2 (two) times daily. 11/07/22   [provider]  famotidine (PEPCID) 40 MG tablet TAKE 1 TABLET BY MOUTH TWICE A DAY 04/12/22   Nehemiah Settle, FNP  FASENRA PEN 30 MG/ML SOAJ Inject 1 mL (30 mg total) into the skin every 8 (eight) weeks. Benralizumab. For severe eosinophilic asthma 09/05/22   Alfonse Spruce, MD  fluticasone [facility]) 50 MCG/ACT nasal spray SPRAY 2 SPRAYS INTO EACH NOSTRIL EVERY DAY 12/01/22   Ambs, Norvel Richards, FNP  fluticasone-salmeterol (ADVAIR DISKUS) 250-50 MCG/ACT AEPB Inhale 1 puff into the lungs in the morning and at bedtime. 12/01/22   Hetty Blend, FNP  gabapentin (NEURONTIN) 300 MG capsule Take 300 mg by mouth 3 (three) times daily. 09/26/22   [provider]  levocetirizine (XYZAL) 5 MG tablet TAKE 1 TABLET BY MOUTH DAILY AS NEEDED FOR ALLERGIES. 07/02/23   Hetty Blend, FNP  meloxicam (MOBIC) 7.5 MG tablet Take 7.5 mg by mouth 2 (two) times daily. 10/02/22   [provider]  methocarbamol (ROBAXIN) 500 MG tablet Take 1 tablet (500 mg total) by mouth 2 (two) times daily. 07/12/22   Leath-Warren, Sadie Haber, NP  methylPREDNISolone (MEDROL DOSEPAK) 4 MG TBPK tablet Bottle: 21 each  Box: 21 each  Disp Pack: 21 each 02/16/23   Anders Simmonds T, DO  montelukast (SINGULAIR) 10 MG tablet TAKE 1 TABLET BY MOUTH EVERYDAY AT BEDTIME 07/02/23   Ambs, Norvel Richards, FNP  ondansetron (ZOFRAN-ODT) 8 MG disintegrating tablet Take 8 mg by mouth every 8 (eight) hours as needed for refractory nausea / vomiting. 11/07/22   [provider]  oxyCODONE (OXY IR/ROXICODONE) 5 MG immediate release tablet Take 5 mg by mouth every 4 (four) hours as needed for breakthrough pain. 11/20/22   [provider]  pantoprazole  (PROTONIX) 40 MG tablet Take 1 tablet (40 mg total) by mouth daily. 05/14/23   Ambs, Norvel Richards, FNP  tiZANidine (ZANAFLEX) 4 MG tablet Take 4 mg by mouth every 6 (six) hours as needed for muscle spasms. 10/02/22   [provider]    Family History Family History  Problem Relation Age of Onset   Allergic rhinitis Father    Asthma Father    Crohn's disease Sister        half sister   Anxiety disorder Maternal Grandmother    Depression Maternal Grandmother    Diverticulitis Paternal Grandmother    Diabetes Other    Heart failure Other    Colon cancer Neg Hx  Celiac disease Neg Hx     Social History Social History   Tobacco Use   Smoking status: Never    Passive exposure: Never   Smokeless tobacco: Never  Vaping Use   Vaping status: Never Used  Substance Use Topics   Alcohol use: Yes    Comment: socially    Drug use: No     Allergies   Amoxicillin, Asa [aspirin], Cefoxitin, and Sulfa antibiotics   Review of Systems Review of Systems Per HPI  Physical Exam Triage Vital Signs ED Triage Vitals [07/24/23 1237]  Encounter Vitals Group     BP 124/84     Systolic BP Percentile      Diastolic BP Percentile      Pulse Rate 87     Resp 20     Temp 98.3 F (36.8 C)     Temp Source Oral     SpO2 98 %     Weight      Height      Head Circumference      Peak Flow      Pain Score 9     Pain Loc      Pain Education      Exclude from Growth Chart    No data found.  Updated Vital Signs BP 124/84 (BP Location: Right Arm)   Pulse 87   Temp 98.3 F (36.8 C) (Oral)   Resp 20   LMP 06/20/2023   SpO2 98%   Visual Acuity Right Eye Distance:   Left Eye Distance:   Bilateral Distance:    Right Eye Near:   Left Eye Near:    Bilateral Near:     Physical Exam Vitals and nursing note reviewed.  Constitutional:      Appearance: Normal appearance. Nicole Rodriguez is not ill-appearing.  HENT:     Head: Atraumatic.     Mouth/Throat:     Mouth: Mucous membranes are  moist.  Eyes:     Extraocular Movements: Extraocular movements intact.     Conjunctiva/sclera: Conjunctivae normal.     Pupils: Pupils are equal, round, and reactive to light.  Cardiovascular:     Rate and Rhythm: Normal rate and regular rhythm.     Heart sounds: Normal heart sounds.  Pulmonary:     Effort: Pulmonary effort is normal.     Breath sounds: Normal breath sounds.  Musculoskeletal:        General: Normal range of motion.     Cervical back: Normal range of motion and neck supple.  Skin:    General: Skin is warm and dry.  Neurological:     Mental Status: Nicole Rodriguez is alert and oriented to person, place, and time.     Cranial Nerves: No cranial nerve deficit.     Motor: No weakness.     Gait: Gait normal.  Psychiatric:        Mood and Affect: Mood normal.        Thought Content: Thought content normal.        Judgment: Judgment normal.      UC Treatments / Results  Labs (all labs ordered are listed, but only abnormal results are displayed) Labs Reviewed - No data to display  EKG   Radiology No results found.  Procedures Procedures (including critical care time)  Medications Ordered in UC Medications  ketorolac (TORADOL) 30 MG/ML injection 30 mg (30 mg Intramuscular Given 07/24/23 1309)  ondansetron (ZOFRAN-ODT) disintegrating tablet 4 mg (4 mg Oral Given 07/24/23 1309)  Initial Impression / Assessment and Plan / UC Course  I have reviewed the triage vital signs and the nursing notes.  Pertinent labs & imaging results that were available during my care of the patient were reviewed by me and considered in my medical decision making (see chart for details).     Treat with IM Toradol, Zofran, Imitrex as needed if not resolving.  Discussed supportive home care and return precautions.  No red flag findings today.  Work note given.  Final Clinical Impressions(s) / UC Diagnoses   Final diagnoses:  Other migraine without status migrainosus, not intractable    Discharge Instructions   None    ED Prescriptions     Medication Sig Dispense Auth. Provider   SUMAtriptan (IMITREX) 50 MG tablet Take 1 tab at onset of migraine. May repeat in 2 hours if headache persists or recurs. Max of 2 tabs daily 10 tablet Particia Nearing, PA-C   ondansetron (ZOFRAN-ODT) 4 MG disintegrating tablet Take 1 tablet (4 mg total) by mouth every 8 (eight) hours as needed for nausea or vomiting. 20 tablet Particia Nearing, [place]      PDMP not reviewed this encounter.   Particia Nearing, [place] 07/24/23 1424

## 2023-07-24 NOTE — ED Triage Notes (Signed)
Pt reports since about 6am she has been having a terrible migraine.   Took tylenol and ibuprofen

## 2023-07-25 ENCOUNTER — Ambulatory Visit: Payer: Self-pay

## 2023-07-25 ENCOUNTER — Telehealth: Payer: Self-pay | Admitting: Physician Assistant

## 2023-07-25 DIAGNOSIS — G43919 Migraine, unspecified, intractable, without status migrainosus: Secondary | ICD-10-CM

## 2023-07-25 NOTE — Patient Instructions (Signed)
[name], thank you for joining [name], PA-C for today's virtual visit.  While this provider is not your primary care provider (PCP), if your PCP is located in our provider database this encounter information will be shared with them immediately following your visit.   A [facility] MyChart account gives you access to today's visit and all your visits, tests, and labs performed at [facility] " click here if you don't have a [facility] MyChart account or go to [email or web address]  Consent: (Patient) [name] provided verbal consent for this virtual visit at the beginning of the encounter.  Current Medications:  Current Outpatient Medications:    albuterol (PROVENTIL) (2.5 MG/3ML) 0.083% nebulizer solution, Take 3 mLs (2.5 mg total) by nebulization every 6 (six) hours as needed for wheezing or shortness of breath., Disp: 75 mL, Rfl: 3   albuterol (VENTOLIN HFA) 108 (90 Base) MCG/ACT inhaler, Inhale 2 puffs into the lungs every 6 (six) hours as needed for wheezing or shortness of breath., Disp: 8 g, Rfl: 1   Azelastine HCl 137 MCG/SPRAY SOLN, Place 2 sprays into both nostrils 2 (two) times daily as needed., Disp: 30 mL, Rfl: 3   buPROPion (WELLBUTRIN SR) 200 MG 12 hr tablet, 1 bid, Disp: 60 tablet, Rfl: 3   clonazePAM (KLONOPIN) 1 MG tablet, 2 qhs, Disp: 60 tablet, Rfl: 1   famotidine (PEPCID) 40 MG tablet, TAKE 1 TABLET BY MOUTH TWICE A DAY, Disp: 180 tablet, Rfl: 1   FASENRA PEN 30 MG/ML SOAJ, Inject 1 mL (30 mg total) into the skin every 8 (eight) weeks. Benralizumab. For severe eosinophilic asthma, Disp: 1 mL, Rfl: 6   fluticasone (FLONASE) 50 MCG/ACT nasal spray, SPRAY 2 SPRAYS INTO EACH NOSTRIL EVERY DAY, Disp: 16 g, Rfl: 5   fluticasone-salmeterol (ADVAIR DISKUS) 250-50 MCG/ACT AEPB, Inhale 1 puff into the lungs in the morning and at bedtime., Disp: 1 each, Rfl: 5   levocetirizine (XYZAL) 5 MG tablet, TAKE 1 TABLET BY MOUTH DAILY AS NEEDED FOR  ALLERGIES., Disp: 30 tablet, Rfl: 0   montelukast (SINGULAIR) 10 MG tablet, TAKE 1 TABLET BY MOUTH EVERYDAY AT BEDTIME, Disp: 30 tablet, Rfl: 0   ondansetron (ZOFRAN-ODT) 4 MG disintegrating tablet, Take 1 tablet (4 mg total) by mouth every 8 (eight) hours as needed for nausea or vomiting., Disp: 20 tablet, Rfl: 0   pantoprazole (PROTONIX) 40 MG tablet, Take 1 tablet (40 mg total) by mouth daily., Disp: 30 tablet, Rfl: 0   SUMAtriptan (IMITREX) 50 MG tablet, Take 1 tab at onset of migraine. May repeat in 2 hours if headache persists or recurs. Max of 2 tabs daily, Disp: 10 tablet, Rfl: 0   Medications ordered in this encounter:  No orders of the defined types were placed in this encounter.    *If you need refills on other medications prior to your next appointment, please contact your pharmacy*  Follow-Up: Call back or seek an in-person evaluation if the symptoms worsen or if the condition fails to improve as anticipated.  [facility] [phone]  Other Instructions Migraine Headache A migraine headache is an intense pulsing or throbbing pain on one or both sides of the head. Migraine headaches may also cause other symptoms, such as nausea, vomiting, and sensitivity to light and noise. A migraine headache can last from 4 hours to 3 days. Talk with your health care provider about what things may bring on (trigger) your migraine headaches. What are the causes? The exact cause  is not known. However, a migraine may be caused when nerves in the brain get irritated and release chemicals that cause blood vessels to become inflamed. This inflammation causes pain. Migraines may be triggered or caused by: Smoking. Medicines, such as: Nitroglycerin, which is used to treat chest pain. Birth control pills. Estrogen. Certain blood pressure medicines. Foods or drinks that contain nitrates, glutamate, aspartame, MSG, or tyramine. Certain foods or drinks, such as aged cheeses, chocolate,  alcohol, or caffeine. Doing physical activity that is very hard. Other triggers may include: Menstruation. Pregnancy. Hunger. Stress. Getting too much or too little sleep. Weather changes. Tiredness (fatigue). What increases the risk? The following factors may make you more likely to have migraine headaches: Being between the ages of 46-47 years old. Being female. Having a family history of migraine headaches. Being Caucasian. Having a mental health condition, such as depression or anxiety. Being obese. What are the signs or symptoms? The main symptom of this condition is pulsing or throbbing pain. This pain may: Happen in any area of the head, such as on one or both sides. Make it hard to do daily activities. Get worse with physical activity. Get worse around bright lights, loud noises, or smells. Other symptoms may include: Nausea. Vomiting. Dizziness. Before a migraine headache starts, you may get warning signs (an aura). An aura may include: Seeing flashing lights or having blind spots. Seeing bright spots, halos, or zigzag lines. Having tunnel vision or blurred vision. Having numbness or a tingling feeling. Having trouble talking. Having muscle weakness. After a migraine ends, you may have symptoms. These may include: Feeling tired. Trouble concentrating. How is this diagnosed? A migraine headache can be diagnosed based on: Your symptoms. A physical exam. Tests, such as: A CT scan or an MRI of the head. These tests can help rule out other causes of headaches. Taking fluid from the spine (lumbar puncture) to examine it (cerebrospinal fluid analysis, or CSF analysis). How is this treated? This condition may be treated with medicines that: Relieve pain and nausea. Prevent migraines. Treatment may also include: Acupuncture. Lifestyle changes like avoiding foods that trigger migraine headaches. Learning ways to control your body (biofeedback). Talk therapy to help  you know and deal with negative thoughts (cognitive behavioral therapy). Follow these instructions at home: Medicines Take over-the-counter and prescription medicines only as told by your provider. Ask your provider if the medicine prescribed to you: Requires you to avoid driving or using machinery. Can cause constipation. You may need to take these actions to prevent or treat constipation: Drink enough fluid to keep your pee (urine) pale yellow. Take over-the-counter or prescription medicines. Eat foods that are high in fiber, such as beans, whole grains, and fresh fruits and vegetables. Limit foods that are high in fat and processed sugars, such as fried or sweet foods. Lifestyle  Do not drink alcohol. Do not use any products that contain nicotine or tobacco. These products include cigarettes, chewing tobacco, and vaping devices, such as e-cigarettes. If you need help quitting, ask your provider. Get 7-9 hours of sleep each night, or the amount recommended by your provider. Find ways to manage stress, such as meditation, deep breathing, or yoga. Try to exercise regularly. This can help lessen how bad and how often your migraines occur. General instructions Keep a journal to find out what triggers your migraines, so you can avoid those things. For example, write down: What you eat and drink. How much sleep you get. Any change to your diet  or medicines. If you have a migraine headache: Avoid things that make your symptoms worse, such as bright lights. Lie down in a dark, quiet room. Do not drive or use machinery. Ask your provider what activities are safe for you while you have symptoms. Keep all follow-up visits. Your provider will monitor your symptoms and recommend any further treatment. Where to find more information Coalition for Headache and Migraine Patients (CHAMP): headachemigraine.org American Migraine Foundation: americanmigrainefoundation.org National Headache Foundation:  headaches.org Contact a health care provider if: You have symptoms that are different or worse than your usual migraine headache symptoms. You have more than 15 days of headaches in one month. Get help right away if: Your migraine headache becomes severe or lasts more than 72 hours. You have a fever or stiff neck. You have vision loss. Your muscles feel weak or like you cannot control them. You lose your balance often or have trouble walking. You faint. You have a seizure. This information is not intended to replace advice given to you by your health care provider. Make sure you discuss any questions you have with your health care provider. Document Revised: 02/13/2022 Document Reviewed: 02/13/2022 Elsevier Patient Education  2024 Elsevier Inc.   If you have been instructed to have an in-person evaluation today at a local Urgent Care facility, please use the link below. It will take you to a list of all of our available [facility], including address, phone number and hours of operation. Please do not delay care.  [facility]  If you or a family member do not have a primary care provider, use the link below to schedule a visit and establish care. When you choose a [name] primary care physician or advanced practice provider, you gain a long-term partner in health. Find a Primary Care Provider  Learn more about [facility] in-office and virtual care options: [facility] - Get Care Now

## 2023-07-25 NOTE — Progress Notes (Signed)
Virtual Visit Consent   Nicole Rodriguez, you are scheduled for a virtual visit with a [name] provider today. Just as with appointments in the office, your consent must be obtained to participate. Your consent will be active for this visit and any virtual visit you may have with one of our providers in the next 365 days. If you have a MyChart account, a copy of this consent can be sent to you electronically.  As this is a virtual visit, video technology does not allow for your provider to perform a traditional examination. This may limit your provider's ability to fully assess your condition. If your provider identifies any concerns that need to be evaluated in person or the need to arrange testing (such as labs, EKG, etc.), we will make arrangements to do so. Although advances in technology are sophisticated, we cannot ensure that it will always work on either your end or our end. If the connection with a video visit is poor, the visit may have to be switched to a telephone visit. With either a video or telephone visit, we are not always able to ensure that we have a secure connection.  By engaging in this virtual visit, you consent to the provision of healthcare and authorize for your insurance to be billed (if applicable) for the services provided during this visit. Depending on your insurance coverage, you may receive a charge related to this service.  I need to obtain your verbal consent now. Are you willing to proceed with your visit today? [name] has provided verbal consent on 07/25/2023 for a virtual visit (video or telephone). [name], PA-C  Date: 07/25/2023 3:33 PM  Virtual Visit via Video Note   I, [name], connected with  Nicole Rodriguez  ([id number], [birth year]) on 07/25/23 at  3:30 PM EST by a video-enabled telemedicine application and verified that I am speaking with the correct person using two identifiers.  Location: Patient: Virtual Visit Location  Patient: Home Provider: Virtual Visit Location Provider: Home Office   I discussed the limitations of evaluation and management by telemedicine and the availability of in person appointments. The patient expressed understanding and agreed to proceed.    History of Present Illness: Nicole Rodriguez is a 33 y.o. who identifies as a female who was assigned female at birth, and is being seen today for continued Migraine. Migraine present for 2 days. Seen at [facility] in person yesterday and was given IM Toradol and started on Imitrex and Zofran as needed for nausea. Still with migraine today. Requesting extended work note for rest and to continue medication to try to break migraine.   Problems:  Patient Active Problem List   Diagnosis Date Noted   Severe persistent asthma, uncomplicated 11/01/2022   Viral URI 07/26/2022   Intermittent palpitations 08/20/2020   Gastric polyp    GAD (generalized anxiety disorder) 07/19/2019   Social anxiety disorder 07/19/2019   Panic disorder 07/19/2019   Bipolar 2 disorder (HCC) 07/19/2019   Other seasonal allergic rhinitis 08/19/2018   GERD (gastroesophageal reflux disease) 08/19/2018   Asthma 11/29/2017   Breast mass, right 03/18/2014   Numbness and tingling in both hands 01/26/2014    Allergies:  Allergies  Allergen Reactions   Amoxicillin Hives   Asa [Aspirin] Hives   Cefoxitin Itching and Rash   Sulfa Antibiotics Hives   Medications:  Current Outpatient Medications:    albuterol (PROVENTIL) (2.5 MG/3ML) 0.083% nebulizer solution, Take 3 mLs (2.5 mg total) by nebulization every 6 (  six) hours as needed for wheezing or shortness of breath., Disp: 75 mL, Rfl: 3   albuterol (VENTOLIN HFA) 108 (90 Base) MCG/ACT inhaler, Inhale 2 puffs into the lungs every 6 (six) hours as needed for wheezing or shortness of breath., Disp: 8 g, Rfl: 1   Azelastine HCl 137 MCG/SPRAY SOLN, Place 2 sprays into both nostrils 2 (two) times daily as needed., Disp: 30 mL, Rfl: 3    buPROPion (WELLBUTRIN SR) 200 MG 12 hr tablet, 1 bid, Disp: 60 tablet, Rfl: 3   clonazePAM (KLONOPIN) 1 MG tablet, 2 qhs, Disp: 60 tablet, Rfl: 1   famotidine (PEPCID) 40 MG tablet, TAKE 1 TABLET BY MOUTH TWICE A DAY, Disp: 180 tablet, Rfl: 1   FASENRA PEN 30 MG/ML SOAJ, Inject 1 mL (30 mg total) into the skin every 8 (eight) weeks. Benralizumab. For severe eosinophilic asthma, Disp: 1 mL, Rfl: 6   fluticasone (FLONASE) 50 MCG/ACT nasal spray, SPRAY 2 SPRAYS INTO EACH NOSTRIL EVERY DAY, Disp: 16 g, Rfl: 5   fluticasone-salmeterol (ADVAIR DISKUS) 250-50 MCG/ACT AEPB, Inhale 1 puff into the lungs in the morning and at bedtime., Disp: 1 each, Rfl: 5   levocetirizine (XYZAL) 5 MG tablet, TAKE 1 TABLET BY MOUTH DAILY AS NEEDED FOR ALLERGIES., Disp: 30 tablet, Rfl: 0   montelukast (SINGULAIR) 10 MG tablet, TAKE 1 TABLET BY MOUTH EVERYDAY AT BEDTIME, Disp: 30 tablet, Rfl: 0   ondansetron (ZOFRAN-ODT) 4 MG disintegrating tablet, Take 1 tablet (4 mg total) by mouth every 8 (eight) hours as needed for nausea or vomiting., Disp: 20 tablet, Rfl: 0   pantoprazole (PROTONIX) 40 MG tablet, Take 1 tablet (40 mg total) by mouth daily., Disp: 30 tablet, Rfl: 0   SUMAtriptan (IMITREX) 50 MG tablet, Take 1 tab at onset of migraine. May repeat in 2 hours if headache persists or recurs. Max of 2 tabs daily, Disp: 10 tablet, Rfl: 0  Observations/Objective: Patient is well-developed, well-nourished in no acute distress.  Resting comfortably at home.  Head is normocephalic, atraumatic.  No labored breathing.  Speech is clear and coherent with logical content.  Patient is alert and oriented at baseline.    Assessment and Plan: 1. Intractable migraine without status migrainosus, unspecified migraine type (Primary)  - Continue current medical treatment - Push fluids - Rest - Follow up if migraine last more than 3-4 days for consideration of steroids for status migrainous - Seek immediate medical attention if  headache worsens or becomes associated with any neurological symptoms  Follow Up Instructions: I discussed the assessment and treatment plan with the patient. The patient was provided an opportunity to ask questions and all were answered. The patient agreed with the plan and demonstrated an understanding of the instructions.  A copy of instructions were sent to the patient via MyChart unless otherwise noted below.    The patient was advised to call back or seek an in-person evaluation if the symptoms worsen or if the condition fails to improve as anticipated.    [name], PA-C

## 2023-07-31 ENCOUNTER — Other Ambulatory Visit (HOSPITAL_COMMUNITY): Payer: Self-pay

## 2023-07-31 DIAGNOSIS — F3181 Bipolar II disorder: Secondary | ICD-10-CM

## 2023-07-31 MED ORDER — CLONAZEPAM 1 MG PO TABS: ORAL_TABLET | ORAL | 2 refills | Status: DC

## 2023-07-31 MED ORDER — BUPROPION HCL ER (SR) 200 MG PO TB12: ORAL_TABLET | ORAL | 2 refills | Status: DC

## 2023-08-06 ENCOUNTER — Encounter: Payer: Self-pay | Admitting: Family Medicine

## 2023-08-06 NOTE — Patient Instructions (Incomplete)
Asthma Continue Advair 250/50 mcg-1 puff twice a day to prevent cough or wheeze.  Continue albuterol 2 puffs every 4 hours as needed for cough or wheeze OR Instead use albuterol 0.083% solution via nebulizer one unit vial every 4 hours as needed for cough or wheeze Continue Fasenra once every 8 weeks for asthma control  Asthma control goals:  Full participation in all desired activities (may need albuterol before activity) Albuterol use two time or less a week on average (not counting use with activity) Cough interfering with sleep two time or less a month Oral steroids no more than once a year No hospitalizations   Allergic rhinitis Continue avoidance measures directed toward grass pollen as listed below Continue Xyzal 5 mg once a day as needed for a runny nose or itch Continue Flonase (fluticasone) 2 sprays in each nostril once a day as needed for a stuffy nose Continue azelastine 2 sprays in each nostril twice a day as needed for a runny nose Consider saline nasal rinses as needed for nasal symptoms. Use this before any medicated nasal sprays for best result  Reflux Continue dietary and lifestyle modifications as listed below Continue pantoprazole 40 mg once a day as previously prescribed. Take this medication 30 minutes before your first meal for best results  Call the clinic if this treatment plan is not working well for you  Follow up in the clinic in  months or sooner if needed.  Reducing Pollen Exposure The American Academy of Allergy, Asthma and Immunology suggests the following steps to reduce your exposure to pollen during allergy seasons. Do not hang sheets or clothing out to dry; pollen may collect on these items. Do not mow lawns or spend time around freshly cut grass; mowing stirs up pollen. Keep windows closed at night.  Keep car windows closed while driving. Minimize morning activities outdoors, a time when pollen counts are usually at their highest. Stay indoors as  much as possible when pollen counts or humidity is high and on windy days when pollen tends to remain in the air longer. Use air conditioning when possible.  Many air conditioners have filters that trap the pollen spores. Use a HEPA room air filter to remove pollen form the indoor air you breathe.

## 2023-08-07 ENCOUNTER — Ambulatory Visit: Payer: Self-pay | Admitting: Family

## 2023-08-14 ENCOUNTER — Ambulatory Visit: Payer: Self-pay | Admitting: Family

## 2023-08-14 DIAGNOSIS — J309 Allergic rhinitis, unspecified: Secondary | ICD-10-CM

## 2023-08-21 ENCOUNTER — Other Ambulatory Visit: Payer: Self-pay | Admitting: Family Medicine

## 2023-08-22 ENCOUNTER — Other Ambulatory Visit (HOSPITAL_COMMUNITY): Payer: Self-pay | Admitting: Psychiatry

## 2023-08-23 ENCOUNTER — Telehealth: Payer: 59 | Admitting: Physician Assistant

## 2023-08-23 DIAGNOSIS — G43819 Other migraine, intractable, without status migrainosus: Secondary | ICD-10-CM | POA: Diagnosis not present

## 2023-08-23 MED ORDER — SUMATRIPTAN SUCCINATE 50 MG PO TABS: 50.0000 mg | ORAL_TABLET | ORAL | 0 refills | Status: DC | PRN

## 2023-08-23 NOTE — Progress Notes (Signed)
Virtual Visit Consent   Nicole Rodriguez, you are scheduled for a virtual visit with a [facility] provider today. Just as with appointments in the office, your consent must be obtained to participate. Your consent will be active for this visit and any virtual visit you may have with one of our providers in the next 365 days. If you have a MyChart account, a copy of this consent can be sent to you electronically.  As this is a virtual visit, video technology does not allow for your provider to perform a traditional examination. This may limit your provider's ability to fully assess your condition. If your provider identifies any concerns that need to be evaluated in person or the need to arrange testing (such as labs, EKG, etc.), we will make arrangements to do so. Although advances in technology are sophisticated, we cannot ensure that it will always work on either your end or our end. If the connection with a video visit is poor, the visit may have to be switched to a telephone visit. With either a video or telephone visit, we are not always able to ensure that we have a secure connection.  By engaging in this virtual visit, you consent to the provision of healthcare and authorize for your insurance to be billed (if applicable) for the services provided during this visit. Depending on your insurance coverage, you may receive a charge related to this service.  I need to obtain your verbal consent now. Are you willing to proceed with your visit today? Nicole Rodriguez has provided verbal consent on 08/23/2023 for a virtual visit (video or telephone). Nicole Loveless, PA-C  Date: 08/23/2023 1:36 PM   Virtual Visit via Video Note   I, Nicole Rodriguez, connected with  Nicole Rodriguez  ([id number], Sep 25, 1990) on 08/23/23 at  1:30 PM EST by a video-enabled telemedicine application and verified that I am speaking with the correct person using two identifiers.  Location: Patient: Virtual Visit  Location Patient: Home Provider: Virtual Visit Location Provider: Home Office   I discussed the limitations of evaluation and management by telemedicine and the availability of in person appointments. The patient expressed understanding and agreed to proceed.    History of Present Illness: Nicole Rodriguez is a 33 y.o. who identifies as a female who was assigned female at birth, and is being seen today for migraine.  HPI: Migraine  This is a new problem. The current episode started today (started at 6am). The problem occurs constantly. The problem has been gradually worsening. The pain is located in the Frontal region. Radiates to: radiates to the occipital region. The pain quality is similar to prior headaches. The quality of the pain is described as pulsating. The pain is moderate. Associated symptoms include nausea, neck pain, phonophobia and photophobia. Pertinent negatives include no blurred vision, dizziness, ear pain, eye pain, eye redness, eye watering, fever, hearing loss, loss of balance, numbness, rhinorrhea, scalp tenderness, tingling, tinnitus, visual change or vomiting. The symptoms are aggravated by bright light and weather changes. She has tried triptans and darkened room (Imitrex, Ibuprofen, Oxycodone, flexeril) for the symptoms. The treatment provided no relief. Her past medical history is significant for migraine headaches.     Problems:  Patient Active Problem List   Diagnosis Date Noted   Severe persistent asthma, uncomplicated 11/01/2022   Viral URI 07/26/2022   Intermittent palpitations 08/20/2020   Gastric polyp    GAD (generalized anxiety disorder) 07/19/2019   Social anxiety disorder 07/19/2019   Panic  disorder 07/19/2019   Bipolar 2 disorder (HCC) 07/19/2019   Other seasonal allergic rhinitis 08/19/2018   GERD (gastroesophageal reflux disease) 08/19/2018   Asthma 11/29/2017   Breast mass, right 03/18/2014   Numbness and tingling in both hands 01/26/2014     Allergies:  Allergies  Allergen Reactions   Amoxicillin Hives   Asa [Aspirin] Hives   Cefoxitin Itching and Rash   Sulfa Antibiotics Hives   Medications:  Current Outpatient Medications:    albuterol (PROVENTIL) (2.5 MG/3ML) 0.083% nebulizer solution, Take 3 mLs (2.5 mg total) by nebulization every 6 (six) hours as needed for wheezing or shortness of breath., Disp: 75 mL, Rfl: 3   albuterol (VENTOLIN HFA) 108 (90 Base) MCG/ACT inhaler, Inhale 2 puffs into the lungs every 6 (six) hours as needed for wheezing or shortness of breath., Disp: 8 g, Rfl: 1   Azelastine HCl 137 MCG/SPRAY SOLN, Place 2 sprays into both nostrils 2 (two) times daily as needed., Disp: 30 mL, Rfl: 3   buPROPion (WELLBUTRIN SR) 200 MG 12 hr tablet, 1 bid, Disp: 60 tablet, Rfl: 2   clonazePAM (KLONOPIN) 1 MG tablet, 2 qhs, Disp: 60 tablet, Rfl: 2   famotidine (PEPCID) 40 MG tablet, TAKE 1 TABLET BY MOUTH TWICE A DAY, Disp: 180 tablet, Rfl: 1   FASENRA PEN 30 MG/ML SOAJ, Inject 1 mL (30 mg total) into the skin every 8 (eight) weeks. Benralizumab. For severe eosinophilic asthma, Disp: 1 mL, Rfl: 6   fluticasone (FLONASE) 50 MCG/ACT nasal spray, SPRAY 2 SPRAYS INTO EACH NOSTRIL EVERY DAY, Disp: 16 g, Rfl: 5   fluticasone-salmeterol (ADVAIR DISKUS) 250-50 MCG/ACT AEPB, Inhale 1 puff into the lungs in the morning and at bedtime., Disp: 1 each, Rfl: 5   levocetirizine (XYZAL) 5 MG tablet, TAKE 1 TABLET BY MOUTH DAILY AS NEEDED FOR ALLERGIES., Disp: 30 tablet, Rfl: 0   montelukast (SINGULAIR) 10 MG tablet, TAKE 1 TABLET BY MOUTH EVERYDAY AT BEDTIME, Disp: 30 tablet, Rfl: 0   ondansetron (ZOFRAN-ODT) 4 MG disintegrating tablet, Take 1 tablet (4 mg total) by mouth every 8 (eight) hours as needed for nausea or vomiting., Disp: 20 tablet, Rfl: 0   pantoprazole (PROTONIX) 40 MG tablet, Take 1 tablet (40 mg total) by mouth daily., Disp: 30 tablet, Rfl: 0   SUMAtriptan (IMITREX) 50 MG tablet, Take 1 tablet (50 mg total) by mouth every 2  (two) hours as needed for migraine. May repeat in 2 hours if headache persists or recurs., Disp: 10 tablet, Rfl: 0  Observations/Objective: Patient is well-developed, well-nourished in no acute distress.  Resting comfortably at home.  Head is normocephalic, atraumatic.  No labored breathing.  Speech is clear and coherent with logical content.  Patient is alert and oriented at baseline.    Assessment and Plan: 1. Other migraine without status migrainosus, intractable (Primary) - SUMAtriptan (IMITREX) 50 MG tablet; Take 1 tablet (50 mg total) by mouth every 2 (two) hours as needed for migraine. May repeat in 2 hours if headache persists or recurs.  Dispense: 10 tablet; Refill: 0  - Recurrent Migraine, had one last month - Has chronic neck issues that complicate migraine course - Discussed taking another Imitrex since it has been over 2 hours since it was last taken - Imitrex refilled - Continue cold packs to neck and face, add heat to the feet - Push fluids - Rest - Seek in person evaluation if migraine persists for consideration of migraine cocktail treatment to break migraine  Follow Up Instructions:  I discussed the assessment and treatment plan with the patient. The patient was provided an opportunity to ask questions and all were answered. The patient agreed with the plan and demonstrated an understanding of the instructions.  A copy of instructions were sent to the patient via MyChart unless otherwise noted below.    The patient was advised to call back or seek an in-person evaluation if the symptoms worsen or if the condition fails to improve as anticipated.    Nicole Loveless, PA-C

## 2023-08-23 NOTE — Patient Instructions (Signed)
Nicole Rodriguez, thank you for joining [name], PA-C for today's virtual visit.  While this provider is not your primary care provider (PCP), if your PCP is located in our provider database this encounter information will be shared with them immediately following your visit.   A [facility] MyChart account gives you access to today's visit and all your visits, tests, and labs performed at [facility] " click here if you don't have a [facility] MyChart account or go to [email or web address]  Consent: (Patient) Nicole Rodriguez provided verbal consent for this virtual visit at the beginning of the encounter.  Current Medications:  Current Outpatient Medications:    albuterol (PROVENTIL) (2.5 MG/3ML) 0.083% nebulizer solution, Take 3 mLs (2.5 mg total) by nebulization every 6 (six) hours as needed for wheezing or shortness of breath., Disp: 75 mL, Rfl: 3   albuterol (VENTOLIN HFA) 108 (90 Base) MCG/ACT inhaler, Inhale 2 puffs into the lungs every 6 (six) hours as needed for wheezing or shortness of breath., Disp: 8 g, Rfl: 1   Azelastine HCl 137 MCG/SPRAY SOLN, Place 2 sprays into both nostrils 2 (two) times daily as needed., Disp: 30 mL, Rfl: 3   buPROPion (WELLBUTRIN SR) 200 MG 12 hr tablet, 1 bid, Disp: 60 tablet, Rfl: 2   clonazePAM (KLONOPIN) 1 MG tablet, 2 qhs, Disp: 60 tablet, Rfl: 2   famotidine (PEPCID) 40 MG tablet, TAKE 1 TABLET BY MOUTH TWICE A DAY, Disp: 180 tablet, Rfl: 1   FASENRA PEN 30 MG/ML SOAJ, Inject 1 mL (30 mg total) into the skin every 8 (eight) weeks. Benralizumab. For severe eosinophilic asthma, Disp: 1 mL, Rfl: 6   fluticasone (FLONASE) 50 MCG/ACT nasal spray, SPRAY 2 SPRAYS INTO EACH NOSTRIL EVERY DAY, Disp: 16 g, Rfl: 5   fluticasone-salmeterol (ADVAIR DISKUS) 250-50 MCG/ACT AEPB, Inhale 1 puff into the lungs in the morning and at bedtime., Disp: 1 each, Rfl: 5   levocetirizine (XYZAL) 5 MG tablet, TAKE 1 TABLET BY MOUTH DAILY AS NEEDED FOR  ALLERGIES., Disp: 30 tablet, Rfl: 0   montelukast (SINGULAIR) 10 MG tablet, TAKE 1 TABLET BY MOUTH EVERYDAY AT BEDTIME, Disp: 30 tablet, Rfl: 0   ondansetron (ZOFRAN-ODT) 4 MG disintegrating tablet, Take 1 tablet (4 mg total) by mouth every 8 (eight) hours as needed for nausea or vomiting., Disp: 20 tablet, Rfl: 0   pantoprazole (PROTONIX) 40 MG tablet, Take 1 tablet (40 mg total) by mouth daily., Disp: 30 tablet, Rfl: 0   SUMAtriptan (IMITREX) 50 MG tablet, Take 1 tablet (50 mg total) by mouth every 2 (two) hours as needed for migraine. May repeat in 2 hours if headache persists or recurs., Disp: 10 tablet, Rfl: 0   Medications ordered in this encounter:  Meds ordered this encounter  Medications   SUMAtriptan (IMITREX) 50 MG tablet    Sig: Take 1 tablet (50 mg total) by mouth every 2 (two) hours as needed for migraine. May repeat in 2 hours if headache persists or recurs.    Dispense:  10 tablet    Refill:  0    Supervising Provider:   Merrilee Jansky [[id number]]     *If you need refills on other medications prior to your next appointment, please contact your pharmacy*  Follow-Up: Call back or seek an in-person evaluation if the symptoms worsen or if the condition fails to improve as anticipated.   Virtual Care [phone]  Other Instructions Migraine Headache A migraine headache is an intense pulsing or  throbbing pain on one or both sides of the head. Migraine headaches may also cause other symptoms, such as nausea, vomiting, and sensitivity to light and noise. A migraine headache can last from 4 hours to 3 days. Talk with your health care provider about what things may bring on (trigger) your migraine headaches. What are the causes? The exact cause is not known. However, a migraine may be caused when nerves in the brain get irritated and release chemicals that cause blood vessels to become inflamed. This inflammation causes pain. Migraines may be triggered or caused  by: Smoking. Medicines, such as: Nitroglycerin, which is used to treat chest pain. Birth control pills. Estrogen. Certain blood pressure medicines. Foods or drinks that contain nitrates, glutamate, aspartame, MSG, or tyramine. Certain foods or drinks, such as aged cheeses, chocolate, alcohol, or caffeine. Doing physical activity that is very hard. Other triggers may include: Menstruation. Pregnancy. Hunger. Stress. Getting too much or too little sleep. Weather changes. Tiredness (fatigue). What increases the risk? The following factors may make you more likely to have migraine headaches: Being between the ages of 64-43 years old. Being female. Having a family history of migraine headaches. Being Caucasian. Having a mental health condition, such as depression or anxiety. Being obese. What are the signs or symptoms? The main symptom of this condition is pulsing or throbbing pain. This pain may: Happen in any area of the head, such as on one or both sides. Make it hard to do daily activities. Get worse with physical activity. Get worse around bright lights, loud noises, or smells. Other symptoms may include: Nausea. Vomiting. Dizziness. Before a migraine headache starts, you may get warning signs (an aura). An aura may include: Seeing flashing lights or having blind spots. Seeing bright spots, halos, or zigzag lines. Having tunnel vision or blurred vision. Having numbness or a tingling feeling. Having trouble talking. Having muscle weakness. After a migraine ends, you may have symptoms. These may include: Feeling tired. Trouble concentrating. How is this diagnosed? A migraine headache can be diagnosed based on: Your symptoms. A physical exam. Tests, such as: A CT scan or an MRI of the head. These tests can help rule out other causes of headaches. Taking fluid from the spine (lumbar puncture) to examine it (cerebrospinal fluid analysis, or CSF analysis). How is this  treated? This condition may be treated with medicines that: Relieve pain and nausea. Prevent migraines. Treatment may also include: Acupuncture. Lifestyle changes like avoiding foods that trigger migraine headaches. Learning ways to control your body (biofeedback). Talk therapy to help you know and deal with negative thoughts (cognitive behavioral therapy). Follow these instructions at home: Medicines Take over-the-counter and prescription medicines only as told by your provider. Ask your provider if the medicine prescribed to you: Requires you to avoid driving or using machinery. Can cause constipation. You may need to take these actions to prevent or treat constipation: Drink enough fluid to keep your pee (urine) pale yellow. Take over-the-counter or prescription medicines. Eat foods that are high in fiber, such as beans, whole grains, and fresh fruits and vegetables. Limit foods that are high in fat and processed sugars, such as fried or sweet foods. Lifestyle  Do not drink alcohol. Do not use any products that contain nicotine or tobacco. These products include cigarettes, chewing tobacco, and vaping devices, such as e-cigarettes. If you need help quitting, ask your provider. Get 7-9 hours of sleep each night, or the amount recommended by your provider. Find ways to manage  stress, such as meditation, deep breathing, or yoga. Try to exercise regularly. This can help lessen how bad and how often your migraines occur. General instructions Keep a journal to find out what triggers your migraines, so you can avoid those things. For example, write down: What you eat and drink. How much sleep you get. Any change to your diet or medicines. If you have a migraine headache: Avoid things that make your symptoms worse, such as bright lights. Lie down in a dark, quiet room. Do not drive or use machinery. Ask your provider what activities are safe for you while you have symptoms. Keep all  follow-up visits. Your provider will monitor your symptoms and recommend any further treatment. Where to find more information Coalition for Headache and Migraine Patients (CHAMP): headachemigraine.org American Migraine Foundation: americanmigrainefoundation.org National Headache Foundation: headaches.org Contact a health care provider if: You have symptoms that are different or worse than your usual migraine headache symptoms. You have more than 15 days of headaches in one month. Get help right away if: Your migraine headache becomes severe or lasts more than 72 hours. You have a fever or stiff neck. You have vision loss. Your muscles feel weak or like you cannot control them. You lose your balance often or have trouble walking. You faint. You have a seizure. This information is not intended to replace advice given to you by your health care provider. Make sure you discuss any questions you have with your health care provider. Document Revised: 02/13/2022 Document Reviewed: 02/13/2022 Elsevier Patient Education  2024 Elsevier Inc.   If you have been instructed to have an in-person evaluation today at a local Urgent Care facility, please use the link below. It will take you to a list of all of our available [facility], including address, phone number and hours of operation. Please do not delay care.  [facility]  If you or a family member do not have a primary care provider, use the link below to schedule a visit and establish care. When you choose a [facility] primary care physician or advanced practice provider, you gain a long-term partner in health. Find a Primary Care Provider  Learn more about [facility]'s in-office and virtual care options: [facility] - Get Care Now

## 2023-08-24 ENCOUNTER — Ambulatory Visit: Payer: Self-pay

## 2023-08-25 ENCOUNTER — Other Ambulatory Visit: Payer: Self-pay | Admitting: Family Medicine

## 2023-09-02 ENCOUNTER — Telehealth: Admitting: Physician Assistant

## 2023-09-02 ENCOUNTER — Emergency Department (HOSPITAL_COMMUNITY)

## 2023-09-02 ENCOUNTER — Other Ambulatory Visit: Payer: Self-pay

## 2023-09-02 ENCOUNTER — Encounter (HOSPITAL_COMMUNITY): Payer: Self-pay | Admitting: *Deleted

## 2023-09-02 ENCOUNTER — Emergency Department (HOSPITAL_COMMUNITY)
Admission: EM | Admit: 2023-09-02 | Discharge: 2023-09-02 | Disposition: A | Discharge status: Home / Self Care | Attending: Emergency Medicine | Admitting: Emergency Medicine

## 2023-09-02 DIAGNOSIS — R110 Nausea: Secondary | ICD-10-CM

## 2023-09-02 DIAGNOSIS — R197 Diarrhea, unspecified: Secondary | ICD-10-CM | POA: Diagnosis not present

## 2023-09-02 DIAGNOSIS — J45909 Unspecified asthma, uncomplicated: Secondary | ICD-10-CM | POA: Diagnosis not present

## 2023-09-02 DIAGNOSIS — K219 Gastro-esophageal reflux disease without esophagitis: Secondary | ICD-10-CM | POA: Diagnosis not present

## 2023-09-02 DIAGNOSIS — R1012 Left upper quadrant pain: Secondary | ICD-10-CM | POA: Insufficient documentation

## 2023-09-02 DIAGNOSIS — R140 Abdominal distension (gaseous): Secondary | ICD-10-CM | POA: Diagnosis not present

## 2023-09-02 DIAGNOSIS — R1013 Epigastric pain: Secondary | ICD-10-CM

## 2023-09-02 DIAGNOSIS — D72829 Elevated white blood cell count, unspecified: Secondary | ICD-10-CM | POA: Diagnosis not present

## 2023-09-02 DIAGNOSIS — Z7951 Long term (current) use of inhaled steroids: Secondary | ICD-10-CM | POA: Diagnosis not present

## 2023-09-02 LAB — CBC WITH DIFFERENTIAL/PLATELET
Abs Immature Granulocytes: 0.04 10*3/uL (ref 0.00–0.07)
Basophils Absolute: 0 10*3/uL (ref 0.0–0.1)
Basophils Relative: 0 %
Eosinophils Absolute: 0 10*3/uL (ref 0.0–0.5)
Eosinophils Relative: 0 %
HCT: 43.7 % (ref 36.0–46.0)
Hemoglobin: 15 g/dL (ref 12.0–15.0)
Immature Granulocytes: 0 %
Lymphocytes Relative: 36 %
Lymphs Abs: 4 10*3/uL (ref 0.7–4.0)
MCH: 30.1 pg (ref 26.0–34.0)
MCHC: 34.3 g/dL (ref 30.0–36.0)
MCV: 87.8 fL (ref 80.0–100.0)
Monocytes Absolute: 0.7 10*3/uL (ref 0.1–1.0)
Monocytes Relative: 6 %
Neutro Abs: 6.3 10*3/uL (ref 1.7–7.7)
Neutrophils Relative %: 58 %
Platelets: 413 10*3/uL — ABNORMAL HIGH (ref 150–400)
RBC: 4.98 MIL/uL (ref 3.87–5.11)
RDW: 12.3 % (ref 11.5–15.5)
WBC: 11 10*3/uL — ABNORMAL HIGH (ref 4.0–10.5)
nRBC: 0 % (ref 0.0–0.2)

## 2023-09-02 LAB — COMPREHENSIVE METABOLIC PANEL
ALT: 18 U/L (ref 0–44)
AST: 12 U/L — ABNORMAL LOW (ref 15–41)
Albumin: 4 g/dL (ref 3.5–5.0)
Alkaline Phosphatase: 47 U/L (ref 38–126)
Anion gap: 11 (ref 5–15)
BUN: 12 mg/dL (ref 6–20)
CO2: 24 mmol/L (ref 22–32)
Calcium: 9.3 mg/dL (ref 8.9–10.3)
Chloride: 102 mmol/L (ref 98–111)
Creatinine, Ser: 0.84 mg/dL (ref 0.44–1.00)
GFR, Estimated: >60 mL/min (ref >60)
Glucose, Bld: 89 mg/dL (ref 70–99)
Potassium: 3.9 mmol/L (ref 3.5–5.1)
Sodium: 137 mmol/L (ref 135–145)
Total Bilirubin: 0.7 mg/dL (ref 0.0–1.2)
Total Protein: 7.3 g/dL (ref 6.5–8.1)

## 2023-09-02 LAB — URINALYSIS, ROUTINE W REFLEX MICROSCOPIC
Bilirubin Urine: NEGATIVE
Glucose, UA: NEGATIVE mg/dL
Ketones, ur: NEGATIVE mg/dL
Leukocytes,Ua: NEGATIVE
Nitrite: NEGATIVE
Protein, ur: NEGATIVE mg/dL
Specific Gravity, Urine: 1.006 (ref 1.005–1.030)
pH: 6 (ref 5.0–8.0)

## 2023-09-02 LAB — LIPASE, BLOOD: Lipase: 35 U/L (ref 11–51)

## 2023-09-02 LAB — HCG, SERUM, QUALITATIVE: Preg, Serum: NEGATIVE

## 2023-09-02 MED ORDER — DIPHENHYDRAMINE HCL 50 MG/ML IJ SOLN
25.0000 mg | Freq: Once | INTRAMUSCULAR | Status: AC
  Administered 2023-09-02: 25 mg via INTRAVENOUS
  Filled 2023-09-02: qty 1

## 2023-09-02 MED ORDER — DICYCLOMINE HCL 10 MG/ML IM SOLN
20.0000 mg | Freq: Once | INTRAMUSCULAR | Status: AC
  Administered 2023-09-02: 20 mg via INTRAMUSCULAR
  Filled 2023-09-02: qty 2

## 2023-09-02 MED ORDER — METOCLOPRAMIDE HCL 5 MG/ML IJ SOLN
10.0000 mg | Freq: Once | INTRAMUSCULAR | Status: AC
  Administered 2023-09-02: 10 mg via INTRAVENOUS
  Filled 2023-09-02: qty 2

## 2023-09-02 MED ORDER — LACTATED RINGERS IV BOLUS
1000.0000 mL | Freq: Once | INTRAVENOUS | Status: AC
  Administered 2023-09-02: 1000 mL via INTRAVENOUS

## 2023-09-02 MED ORDER — ONDANSETRON 4 MG PO TBDP: 4.0000 mg | ORAL_TABLET | Freq: Three times a day (TID) | ORAL | 0 refills | Status: DC | PRN

## 2023-09-02 MED ORDER — ONDANSETRON HCL 4 MG/2ML IJ SOLN
4.0000 mg | Freq: Once | INTRAMUSCULAR | Status: AC
  Administered 2023-09-02: 4 mg via INTRAVENOUS
  Filled 2023-09-02: qty 2

## 2023-09-02 MED ORDER — OMEPRAZOLE 20 MG PO CPDR: 20.0000 mg | DELAYED_RELEASE_CAPSULE | Freq: Every day | ORAL | 0 refills | Status: DC

## 2023-09-02 MED ORDER — IOHEXOL 300 MG/ML  SOLN
100.0000 mL | Freq: Once | INTRAMUSCULAR | Status: AC | PRN
  Administered 2023-09-02: 100 mL via INTRAVENOUS

## 2023-09-02 MED ORDER — DICYCLOMINE HCL 20 MG PO TABS: 20.0000 mg | ORAL_TABLET | Freq: Two times a day (BID) | ORAL | 0 refills | Status: DC

## 2023-09-02 MED ORDER — FAMOTIDINE 40 MG PO TABS: 40.0000 mg | ORAL_TABLET | Freq: Every day | ORAL | 0 refills | Status: DC

## 2023-09-02 NOTE — Progress Notes (Signed)
 Virtual Visit Consent   Nicole Rodriguez, you are scheduled for a virtual visit with a [facility] provider today. Just as with appointments in the office, your consent must be obtained to participate. Your consent will be active for this visit and any virtual visit you may have with one of our providers in the next 365 days. If you have a MyChart account, a copy of this consent can be sent to you electronically.  As this is a virtual visit, video technology does not allow for your provider to perform a traditional examination. This may limit your provider's ability to fully assess your condition. If your provider identifies any concerns that need to be evaluated in person or the need to arrange testing (such as labs, EKG, etc.), we will make arrangements to do so. Although advances in technology are sophisticated, we cannot ensure that it will always work on either your end or our end. If the connection with a video visit is poor, the visit may have to be switched to a telephone visit. With either a video or telephone visit, we are not always able to ensure that we have a secure connection.  By engaging in this virtual visit, you consent to the provision of healthcare and authorize for your insurance to be billed (if applicable) for the services provided during this visit. Depending on your insurance coverage, you may receive a charge related to this service.  I need to obtain your verbal consent now. Are you willing to proceed with your visit today? [name] has provided verbal consent on 09/02/2023 for a virtual visit (video or telephone). [name], PA-C  Date: 09/02/2023 11:22 AM   Virtual Visit via Video Note   I, [name], connected with  Nicole Rodriguez  ([id number], 04/05/1991) on 09/02/23 at 11:15 AM EST by a video-enabled telemedicine application and verified that I am speaking with the correct person using two identifiers.  Location: Patient: Virtual Visit Location Patient:  Home Provider: Virtual Visit Location Provider: Home Office   I discussed the limitations of evaluation and management by telemedicine and the availability of in person appointments. The patient expressed understanding and agreed to proceed.    History of Present Illness: Nicole Rodriguez is a 33 y.o. with a history of migraines, asthma, and acid reflux, presents with a week-long history of stomach pain and a feeling of fullness. The symptoms began after a two-day migraine headache and a bout of diarrhea, which she suspects was due to a stomach flu. The pain is centrally located, right below the breast area, and is described as a feeling of 'something being held.' She has had no desire to eat, but has been forcing herself to consume peanut butter crackers and Pedialyte for hydration. She has had daily bowel movements, which are loose but not diarrhea. She has also experienced intermittent nausea throughout the week. She has been managing the stomach pain with Tylenol and ibuprofen, and has taken ibuprofen approximately five times throughout the week at a dose of 800mg . She is currently taking Protonix daily for acid reflux.   Problems:  Patient Active Problem List   Diagnosis Date Noted   Severe persistent asthma, uncomplicated 11/01/2022   Viral URI 07/26/2022   Intermittent palpitations 08/20/2020   Gastric polyp    GAD (generalized anxiety disorder) 07/19/2019   Social anxiety disorder 07/19/2019   Panic disorder 07/19/2019   Bipolar 2 disorder (HCC) 07/19/2019   Other seasonal allergic rhinitis 08/19/2018   GERD (gastroesophageal reflux  disease) 08/19/2018   Asthma 11/29/2017   Breast mass, right 03/18/2014   Numbness and tingling in both hands 01/26/2014    Allergies:  Allergies  Allergen Reactions   Amoxicillin Hives   Asa [Aspirin] Hives   Cefoxitin Itching and Rash   Sulfa Antibiotics Hives   Medications:  Current Outpatient Medications:    albuterol (PROVENTIL) (2.5  MG/3ML) 0.083% nebulizer solution, Take 3 mLs (2.5 mg total) by nebulization every 6 (six) hours as needed for wheezing or shortness of breath., Disp: 75 mL, Rfl: 3   albuterol (VENTOLIN HFA) 108 (90 Base) MCG/ACT inhaler, Inhale 2 puffs into the lungs every 6 (six) hours as needed for wheezing or shortness of breath., Disp: 8 g, Rfl: 1   Azelastine HCl 137 MCG/SPRAY SOLN, Place 2 sprays into both nostrils 2 (two) times daily as needed., Disp: 30 mL, Rfl: 3   buPROPion (WELLBUTRIN SR) 200 MG 12 hr tablet, TAKE 1 TABLET BY MOUTH TWICE A DAY, Disp: 180 tablet, Rfl: 1   clonazePAM (KLONOPIN) 1 MG tablet, 2 qhs, Disp: 60 tablet, Rfl: 2   famotidine (PEPCID) 40 MG tablet, Take 1 tablet (40 mg total) by mouth daily., Disp: 30 tablet, Rfl: 0   FASENRA PEN 30 MG/ML SOAJ, Inject 1 mL (30 mg total) into the skin every 8 (eight) weeks. Benralizumab. For severe eosinophilic asthma, Disp: 1 mL, Rfl: 6   fluticasone (FLONASE) 50 MCG/ACT nasal spray, SPRAY 2 SPRAYS INTO EACH NOSTRIL EVERY DAY, Disp: 16 g, Rfl: 5   fluticasone-salmeterol (ADVAIR DISKUS) 250-50 MCG/ACT AEPB, Inhale 1 puff into the lungs in the morning and at bedtime., Disp: 1 each, Rfl: 5   levocetirizine (XYZAL) 5 MG tablet, TAKE 1 TABLET BY MOUTH DAILY AS NEEDED FOR ALLERGIES., Disp: 30 tablet, Rfl: 0   montelukast (SINGULAIR) 10 MG tablet, TAKE 1 TABLET BY MOUTH EVERYDAY AT BEDTIME, Disp: 30 tablet, Rfl: 0   ondansetron (ZOFRAN-ODT) 4 MG disintegrating tablet, Take 1 tablet (4 mg total) by mouth every 8 (eight) hours as needed for nausea or vomiting., Disp: 20 tablet, Rfl: 0   pantoprazole (PROTONIX) 40 MG tablet, Take 1 tablet (40 mg total) by mouth daily., Disp: 30 tablet, Rfl: 0   SUMAtriptan (IMITREX) 50 MG tablet, Take 1 tablet (50 mg total) by mouth every 2 (two) hours as needed for migraine. May repeat in 2 hours if headache persists or recurs., Disp: 10 tablet, Rfl: 0  Observations/Objective: Patient is well-developed, well-nourished in no  acute distress.  Resting comfortably  at home.  Head is normocephalic, atraumatic.  No labored breathing.  Speech is clear and coherent with logical content.  Patient is alert and oriented at baseline.    Assessment and Plan: 1. Epigastric pain (Primary) - famotidine (PEPCID) 40 MG tablet; Take 1 tablet (40 mg total) by mouth daily.  Dispense: 30 tablet; Refill: 0  2. Nausea - ondansetron (ZOFRAN-ODT) 4 MG disintegrating tablet; Take 1 tablet (4 mg total) by mouth every 8 (eight) hours as needed for nausea or vomiting.  Dispense: 20 tablet; Refill: 0  3. Gastroesophageal reflux disease without esophagitis - famotidine (PEPCID) 40 MG tablet; Take 1 tablet (40 mg total) by mouth daily.  Dispense: 30 tablet; Refill: 0   Recent history of diarrhea and nausea with persistent epigastric pain and fullness. No current vomiting or fever. Loose stools. Currently on Protonix for acid reflux. Recent use of ibuprofen. -Continue Protonix as prescribed. -Add Pepcid to regimen. -Avoid ibuprofen. -Start Zofran for nausea. -Advised to follow  a gentle diet. -If symptoms persist or worsen, patient is advised to seek in-person medical attention.   Follow Up Instructions: I discussed the assessment and treatment plan with the patient. The patient was provided an opportunity to ask questions and all were answered. The patient agreed with the plan and demonstrated an understanding of the instructions.  A copy of instructions were sent to the patient via MyChart unless otherwise noted below.     The patient was advised to call back or seek an in-person evaluation if the symptoms worsen or if the condition fails to improve as anticipated.    Kasandra Knudsen Mayers, PA-C

## 2023-09-02 NOTE — ED Triage Notes (Addendum)
 Pt with abd pain with Nausea for a week, diarrhea x 2 days.  Denies any emesis. Decrease in appetite. Had some dizziness last night while in shower, denies syncope. States her daughter was sick recently- GI symptoms.

## 2023-09-02 NOTE — Patient Instructions (Signed)
 Nicole Rodriguez, thank you for joining [name], PA-C for today's virtual visit.  While this provider is not your primary care provider (PCP), if your PCP is located in our provider database this encounter information will be shared with them immediately following your visit.   A [facility] MyChart account gives you access to today's visit and all your visits, tests, and labs performed at [facility] " click here if you don't have a [facility] MyChart account or go to [email or web address]  Consent: (Patient) Nicole Rodriguez provided verbal consent for this virtual visit at the beginning of the encounter.  Current Medications:  Current Outpatient Medications:    albuterol (PROVENTIL) (2.5 MG/3ML) 0.083% nebulizer solution, Take 3 mLs (2.5 mg total) by nebulization every 6 (six) hours as needed for wheezing or shortness of breath., Disp: 75 mL, Rfl: 3   albuterol (VENTOLIN HFA) 108 (90 Base) MCG/ACT inhaler, Inhale 2 puffs into the lungs every 6 (six) hours as needed for wheezing or shortness of breath., Disp: 8 g, Rfl: 1   Azelastine HCl 137 MCG/SPRAY SOLN, Place 2 sprays into both nostrils 2 (two) times daily as needed., Disp: 30 mL, Rfl: 3   buPROPion (WELLBUTRIN SR) 200 MG 12 hr tablet, TAKE 1 TABLET BY MOUTH TWICE A DAY, Disp: 180 tablet, Rfl: 1   clonazePAM (KLONOPIN) 1 MG tablet, 2 qhs, Disp: 60 tablet, Rfl: 2   famotidine (PEPCID) 40 MG tablet, Take 1 tablet (40 mg total) by mouth daily., Disp: 30 tablet, Rfl: 0   FASENRA PEN 30 MG/ML SOAJ, Inject 1 mL (30 mg total) into the skin every 8 (eight) weeks. Benralizumab. For severe eosinophilic asthma, Disp: 1 mL, Rfl: 6   fluticasone (FLONASE) 50 MCG/ACT nasal spray, SPRAY 2 SPRAYS INTO EACH NOSTRIL EVERY DAY, Disp: 16 g, Rfl: 5   fluticasone-salmeterol (ADVAIR DISKUS) 250-50 MCG/ACT AEPB, Inhale 1 puff into the lungs in the morning and at bedtime., Disp: 1 each, Rfl: 5   levocetirizine (XYZAL) 5 MG tablet, TAKE 1  TABLET BY MOUTH DAILY AS NEEDED FOR ALLERGIES., Disp: 30 tablet, Rfl: 0   montelukast (SINGULAIR) 10 MG tablet, TAKE 1 TABLET BY MOUTH EVERYDAY AT BEDTIME, Disp: 30 tablet, Rfl: 0   ondansetron (ZOFRAN-ODT) 4 MG disintegrating tablet, Take 1 tablet (4 mg total) by mouth every 8 (eight) hours as needed for nausea or vomiting., Disp: 20 tablet, Rfl: 0   pantoprazole (PROTONIX) 40 MG tablet, Take 1 tablet (40 mg total) by mouth daily., Disp: 30 tablet, Rfl: 0   SUMAtriptan (IMITREX) 50 MG tablet, Take 1 tablet (50 mg total) by mouth every 2 (two) hours as needed for migraine. May repeat in 2 hours if headache persists or recurs., Disp: 10 tablet, Rfl: 0   Medications ordered in this encounter:  Meds ordered this encounter  Medications   ondansetron (ZOFRAN-ODT) 4 MG disintegrating tablet    Sig: Take 1 tablet (4 mg total) by mouth every 8 (eight) hours as needed for nausea or vomiting.    Dispense:  20 tablet    Refill:  0    Supervising Provider:   Merrilee Jansky [[id number]]   famotidine (PEPCID) 40 MG tablet    Sig: Take 1 tablet (40 mg total) by mouth daily.    Dispense:  30 tablet    Refill:  0    Supervising Provider:   Merrilee Jansky [id number]     *If you need refills on other medications prior to your next appointment, please contact  your pharmacy*  Follow-Up: Call back or seek an in-person evaluation if the symptoms worsen or if the condition fails to improve as anticipated.  [facility] [phone]  Other Instructions Nausea, Adult Nausea is the feeling of having an upset stomach or that you are about to vomit. Nausea on its own is not usually a serious concern, but it may be an early sign of a more serious medical problem. As nausea gets worse, it can lead to vomiting. If vomiting develops, or if you are not able to drink enough fluids, you are at risk of becoming dehydrated. Dehydration can make you tired and thirsty, cause you to have a dry mouth, and  decrease how often you urinate. Older adults and people with other diseases or a weak disease-fighting system (immune system) are at higher risk for dehydration. The main goals of treating your nausea are: To relieve your nausea. To limit repeated nausea episodes. To prevent vomiting and dehydration. Follow these instructions at home: Watch your symptoms for any changes. Tell your health care provider about them. Eating and drinking     Take an oral rehydration solution (ORS). This is a drink that is sold at pharmacies and retail stores. Drink clear fluids slowly and in small amounts as you are able. Clear fluids include water, ice chips, low-calorie sports drinks, and fruit juice that has water added (diluted fruit juice). Eat bland, easy-to-digest foods in small amounts as you are able. These foods include bananas, applesauce, rice, lean meats, toast, and crackers. Avoid drinking fluids that contain a lot of sugar or caffeine, such as energy drinks, sports drinks, and soda. Avoid alcohol. Avoid spicy or fatty foods. General instructions Take over-the-counter and prescription medicines only as told by your health care provider. Rest at home while you recover. Drink enough fluid to keep your urine pale yellow. Breathe slowly and deeply when you feel nauseous. Avoid smelling things that have strong odors. Wash your hands often using soap and water for at least 20 seconds. If soap and water are not available, use hand sanitizer. Make sure that everyone in your household washes their hands well and often. Keep all follow-up visits. This is important. Contact a health care provider if: Your nausea gets worse. Your nausea does not go away after two days. You vomit multiple times. You cannot drink fluids without vomiting. You have any of the following: New symptoms. A fever. A headache. Muscle cramps. A rash. Pain while urinating. You feel light-headed or dizzy. Get help right away  if: You have pain in your chest, neck, arm, or jaw. You feel extremely weak or you faint. You have vomit that is bright red or looks like coffee grounds. You have bloody or black stools (feces) or stools that look like tar. You have a severe headache, a stiff neck, or both. You have severe pain, cramping, or bloating in your abdomen. You have difficulty breathing or are breathing very quickly. Your heart is beating very quickly. Your skin feels cold and clammy. You feel confused. You have signs of dehydration, such as: Dark urine, very little urine, or no urine. Cracked lips. Dry mouth. Sunken eyes. Sleepiness. Weakness. These symptoms may be an emergency. Get help right away. Call 911. Do not wait to see if the symptoms will go away. Do not drive yourself to the hospital. Summary Nausea is the feeling that you have an upset stomach or that you are about to vomit. Nausea on its own is not usually  a serious concern, but it may be an early sign of a more serious medical problem. If vomiting develops, or if you are not able to drink enough fluids, you are at risk of becoming dehydrated. Follow recommendations for eating and drinking and take over-the-counter and prescription medicines only as told by your health care provider. Contact a health care provider right away if your symptoms worsen or you have new symptoms. Keep all follow-up visits. This is important. This information is not intended to replace advice given to you by your health care provider. Make sure you discuss any questions you have with your health care provider. Document Revised: 12/24/2020 Document Reviewed: 12/24/2020 Elsevier Patient Education  2024 [employer]   If you have been instructed to have an in-person evaluation today at a local Urgent Care facility, please use the link below. It will take you to a list of all of our available [facility], including address, phone number and hours of  operation. Please do not delay care.  [facility]  If you or a family member do not have a primary care provider, use the link below to schedule a visit and establish care. When you choose a [facility] primary care physician or advanced practice provider, you gain a long-term partner in health. Find a Primary Care Provider  Learn more about [facility]'s in-office and virtual care options: [facility] Get Care Now

## 2023-09-02 NOTE — ED Provider Notes (Signed)
 [place] EMERGENCY DEPARTMENT AT [facility] Provider Note   CSN: [id number] Arrival date & time: 09/02/23  1242     History  Chief Complaint  Patient presents with   Abdominal Pain    Nicole Rodriguez is a 33 y.o. female.  Patient is a 33 year old female who presents emergency department the chief complaint of nausea, diarrhea, epigastric and left upper quadrant abdominal pain which has been ongoing and intermittent for approximate the past week.  She notes that she was exposed to her daughter who was sick with similar symptoms.  Patient notes that she has had no associated fever or chills.  She denies any chest pain or shortness of breath.  She has had no associated cough, congestion, rhinorrhea, sore throat.  There is been no melena or hematochezia.  Patient denies any dysuria or hematuria.   Abdominal Pain Associated symptoms: diarrhea and nausea        Home Medications Prior to Admission medications   Medication Sig Start Date End Date Taking? Authorizing Provider  albuterol (PROVENTIL) (2.5 MG/3ML) 0.083% nebulizer solution Take 3 mLs (2.5 mg total) by nebulization every 6 (six) hours as needed for wheezing or shortness of breath. 12/01/22   Hetty Blend, FNP  albuterol (VENTOLIN HFA) 108 (90 Base) MCG/ACT inhaler Inhale 2 puffs into the lungs every 6 (six) hours as needed for wheezing or shortness of breath. 12/04/22   Ambs, Norvel Richards, FNP  Azelastine HCl 137 MCG/SPRAY SOLN Place 2 sprays into both nostrils 2 (two) times daily as needed. 12/01/22   Hetty Blend, FNP  buPROPion [facility] SR) 200 MG 12 hr tablet TAKE 1 TABLET BY MOUTH TWICE A DAY 08/28/23   Plovsky, Earvin Hansen, MD  clonazePAM [facility]) 1 MG tablet 2 qhs 07/31/23   Plovsky, Earvin Hansen, MD  famotidine (PEPCID) 40 MG tablet Take 1 tablet (40 mg total) by mouth daily. 09/02/23   Mayers, Cari S, PA-C  FASENRA PEN 30 MG/ML SOAJ Inject 1 mL (30 mg total) into the skin every 8 (eight) weeks. Benralizumab. For severe  eosinophilic asthma 09/05/22   Alfonse Spruce, MD  fluticasone [facility]) 50 MCG/ACT nasal spray SPRAY 2 SPRAYS INTO EACH NOSTRIL EVERY DAY 12/01/22   Ambs, Norvel Richards, FNP  fluticasone-salmeterol (ADVAIR DISKUS) 250-50 MCG/ACT AEPB Inhale 1 puff into the lungs in the morning and at bedtime. 12/01/22   Ambs, Norvel Richards, FNP  levocetirizine (XYZAL) 5 MG tablet TAKE 1 TABLET BY MOUTH DAILY AS NEEDED FOR ALLERGIES. 07/02/23   Hetty Blend, FNP  montelukast (SINGULAIR) 10 MG tablet TAKE 1 TABLET BY MOUTH EVERYDAY AT BEDTIME 07/02/23   Ambs, Norvel Richards, FNP  ondansetron (ZOFRAN-ODT) 4 MG disintegrating tablet Take 1 tablet (4 mg total) by mouth every 8 (eight) hours as needed for nausea or vomiting. 09/02/23   Mayers, Cari S, PA-C  pantoprazole (PROTONIX) 40 MG tablet Take 1 tablet (40 mg total) by mouth daily. 05/14/23   Hetty Blend, FNP  SUMAtriptan (IMITREX) 50 MG tablet Take 1 tablet (50 mg total) by mouth every 2 (two) hours as needed for migraine. May repeat in 2 hours if headache persists or recurs. 08/23/23   [name], PA-C      Allergies    Amoxicillin, Asa [aspirin], Cefoxitin, and Sulfa antibiotics    Review of Systems   Review of Systems  Gastrointestinal:  Positive for abdominal pain, diarrhea and nausea.    Physical Exam Updated Vital Signs BP (!) 136/94 (BP Location: Right Arm)  Pulse 93   Temp 98.2 F (36.8 C) (Oral)   Resp 18   Ht 5\' 2"  (1.575 m)   Wt 83.9 kg   LMP 08/28/2023   SpO2 100%   BMI 33.84 kg/m  Physical Exam Vitals and nursing note reviewed.  Constitutional:      Appearance: Normal appearance.  HENT:     Head: Normocephalic and atraumatic.     Nose: Nose normal.     Mouth/Throat:     Mouth: Mucous membranes are moist.  Eyes:     Extraocular Movements: Extraocular movements intact.     Conjunctiva/sclera: Conjunctivae normal.     Pupils: Pupils are equal, round, and reactive to light.  Cardiovascular:     Rate and Rhythm: Normal rate and regular  rhythm.     Pulses: Normal pulses.     Heart sounds: Normal heart sounds.  Pulmonary:     Effort: Pulmonary effort is normal.     Breath sounds: Normal breath sounds.  Abdominal:     General: Abdomen is flat. Bowel sounds are normal. There is distension.     Palpations: Abdomen is soft.     Comments: Tenderness palpation to epigastric and left upper quadrant  Musculoskeletal:        General: Normal range of motion.     Cervical back: Normal range of motion and neck supple.  Skin:    General: Skin is warm and dry.  Neurological:     General: No focal deficit present.     Mental Status: She is alert and oriented to person, place, and time. Mental status is at baseline.  Psychiatric:        Mood and Affect: Mood normal.        Behavior: Behavior normal.        Thought Content: Thought content normal.        Judgment: Judgment normal.     ED Results / Procedures / Treatments   Labs (all labs ordered are listed, but only abnormal results are displayed) Labs Reviewed  COMPREHENSIVE METABOLIC PANEL - Abnormal; Notable for the following components:      Result Value   AST 12 (*)    All other components within normal limits  CBC WITH DIFFERENTIAL/PLATELET - Abnormal; Notable for the following components:   WBC 11.0 (*)    Platelets 413 (*)    All other components within normal limits  URINALYSIS, ROUTINE W REFLEX MICROSCOPIC - Abnormal; Notable for the following components:   Color, Urine STRAW (*)    Hgb urine dipstick SMALL (*)    Bacteria, UA RARE (*)    All other components within normal limits  LIPASE, BLOOD  HCG, SERUM, QUALITATIVE    EKG None  Radiology CT ABDOMEN PELVIS W CONTRAST Result Date: 09/02/2023 CLINICAL DATA:  Epigastric pain and nausea, diarrhea EXAM: CT ABDOMEN AND PELVIS WITH CONTRAST TECHNIQUE: Multidetector CT imaging of the abdomen and pelvis was performed using the standard protocol following bolus administration of intravenous contrast. RADIATION  DOSE REDUCTION: This exam was performed according to the departmental dose-optimization program which includes automated exposure control, adjustment of the mA and/or kV according to patient size and/or use of iterative reconstruction technique. CONTRAST:  OMNIPAQUE IOHEXOL 300 MG/ML  SOLN COMPARISON:  08/23/2021 FINDINGS: Lower chest: No acute abnormality. Hepatobiliary: No solid liver abnormality is seen. No gallstones, gallbladder wall thickening, or biliary dilatation. Pancreas: Unremarkable. No pancreatic ductal dilatation or surrounding inflammatory changes. Spleen: Normal in size without significant abnormality. Adrenals/Urinary  Tract: Adrenal glands are unremarkable. Kidneys are normal, without renal calculi, solid lesion, or hydronephrosis. Bladder is unremarkable. Stomach/Bowel: Stomach is within normal limits. Status post appendectomy. No evidence of bowel wall thickening, distention, or inflammatory changes. Vascular/Lymphatic: No significant vascular findings are present. No enlarged abdominal or pelvic lymph nodes. Reproductive: No mass or other significant abnormality. Benign ovarian cysts and follicles, requiring no further follow-up or characterization. Other: No abdominal wall hernia or abnormality. No ascites. Musculoskeletal: No acute or significant osseous findings. IMPRESSION: 1. No acute CT findings of the abdomen or pelvis to explain epigastric pain, nausea, or diarrhea. 2. Status post appendectomy. Electronically Signed   By: Jearld Lesch M.D.   On: 09/02/2023 15:37    Procedures Procedures    Medications Ordered in ED Medications  lactated ringers bolus 1,000 mL (1,000 mLs Intravenous New Bag/Given 09/02/23 1422)  ondansetron (ZOFRAN) injection 4 mg (4 mg Intravenous Given 09/02/23 1424)  dicyclomine (BENTYL) injection 20 mg (20 mg Intramuscular Given 09/02/23 1425)  iohexol (OMNIPAQUE) 300 MG/ML solution 100 mL (100 mLs Intravenous Contrast Given 09/02/23 1524)  metoCLOPramide  (REGLAN) injection 10 mg (10 mg Intravenous Given 09/02/23 1546)  diphenhydrAMINE (BENADRYL) injection 25 mg (25 mg Intravenous Given 09/02/23 1547)    ED Course/ Medical Decision Making/ A&P                                 Medical Decision Making Amount and/or Complexity of Data Reviewed Labs: ordered. Radiology: ordered.  Risk Prescription drug management.   This patient presents to the ED for concern of abdominal pain, diarrhea, nausea differential diagnosis includes enteritis, acute cholecystitis, small bowel obstruction, diverticulitis, ovarian torsion or cyst, PID, to an abscess, pyelonephritis, kidney stone, mesenteric ischemia, pancreatitis, gastritis    Additional history obtained:  Additional history obtained from spouse External records from outside source obtained and reviewed including none   Lab Tests:  I Ordered, and personally interpreted labs.  The pertinent results include: Mild leukocytosis, no anemia, normal electrolytes, normal creatinine   Imaging Studies ordered:  I ordered imaging studies including CT scan of the abdomen and pelvis I independently visualized and interpreted imaging which showed no acute intra-abdominal surgical pathology I agree with the radiologist interpretation   Medicines ordered and prescription drug management:  I ordered medication including IV fluids, Zofran, Reglan, Bentyl, Benadryl for abdominal pain, nausea Reevaluation of the patient after these medicines showed that the patient improved I have reviewed the patients home medicines and have made adjustments as needed   Problem List / ED Course:  Patient is doing better at this time and is stable for discharge home.  Discussed with patient that suspect her symptoms are secondary to an acute viral gastroenteritis.  CT scan of abdomen pelvis demonstrated no signs of acute surgical pathology.  She has no indication for line etiology such as acute cholecystitis, small bowel  torsion, diverticulitis, ovarian torsion or cyst, PID, to brain abscess, pyelonephritis, kidney stone.  Will continue symptomatic treatment on outpatient basis.  Strict return precautions were discussed for any new or worsening symptoms.  Patient voiced understanding and had no additional questions.   Social Determinants of Health:  None           Final Clinical Impression(s) / ED Diagnoses Final diagnoses:  None    Rx / DC Orders ED Discharge Orders     None         Huston Foley  D, PA-C 09/02/23 1612    Bethann Berkshire, MD 09/04/23 [phone]

## 2023-09-02 NOTE — Discharge Instructions (Signed)
 Please follow-up closely with a primary care doctor on an outpatient basis.  Return to emergency department immediately for any new or worsening symptoms.  [facility] Primary Care Doctor List    Syliva Overman, MD. Specialty: [facility] Medicine Contact information: [address]  [place] [address]  [phone]   Lilyan Punt, MD. Specialty: [facility] Medicine Contact information: [address]  [place] [address]  [phone]   Avon Gully, MD Specialty: Internal Medicine Contact information: [address] [place] [address]  [phone]   Catalina Pizza, MD. Specialty: Internal Medicine Contact information: [address]  [place] [address]  [phone]    [facility] (Dr. Selena Batten) Specialty: Family Medicine Contact information: [address]  [place] [address]  [phone]   John Giovanni, MD. Specialty: [facility] Medicine Contact information: [address]  [address]  [place] [address]  [phone]   Carylon Perches, MD. Specialty: Internal Medicine Contact information: [address]  [address]  [place] [address]  [phone]   [facility] Family Medicine: [address] [phone]  [place], Family medicine [address]  [phone]  [facility] [address] [place], [place] [phone]  [facility]: [address] [phone]    [facility] - Benita Stabile  [address] [place], [place] [address] [phone]  Services [facility] offers a variety of basic health services.  Services include but are not limited to: Blood pressure checks  Heart rate checks  Blood sugar checks  Urine analysis  Rapid strep tests  Pregnancy tests.  Health education and referrals  People needing more complex services will be directed to a physician online. Using these virtual visits, doctors can evaluate  and prescribe medicine and treatments. There will be no medication on-site, though [facility] will help patients fill their prescriptions at little to no cost.   For More information please go to: [email or web address]  Allergy and Asthma:    [address] [place] Ace [phone]  Urology:  [address]  [place] [phone]  [facility]  [address] [place], [place] [phone]  Orthopedics   [address] [place], [place] [phone]  Endocrinology  [address] [place], [place] [phone]  Podiatry: [facility] [phone]

## 2023-10-11 ENCOUNTER — Ambulatory Visit

## 2023-10-16 ENCOUNTER — Encounter (HOSPITAL_COMMUNITY): Payer: Self-pay | Admitting: Psychiatry

## 2023-10-16 ENCOUNTER — Ambulatory Visit (HOSPITAL_COMMUNITY): Payer: Self-pay | Admitting: Psychiatry

## 2023-10-16 ENCOUNTER — Other Ambulatory Visit: Payer: Self-pay

## 2023-10-16 VITALS — BP 138/83 | HR 77 | Ht 62.0 in | Wt 188.0 lb

## 2023-10-16 DIAGNOSIS — F325 Major depressive disorder, single episode, in full remission: Secondary | ICD-10-CM

## 2023-10-16 DIAGNOSIS — F410 Panic disorder [episodic paroxysmal anxiety] without agoraphobia: Secondary | ICD-10-CM | POA: Diagnosis not present

## 2023-10-16 DIAGNOSIS — F3181 Bipolar II disorder: Secondary | ICD-10-CM

## 2023-10-16 DIAGNOSIS — F418 Other specified anxiety disorders: Secondary | ICD-10-CM | POA: Diagnosis not present

## 2023-10-16 MED ORDER — CLONAZEPAM 1 MG PO TABS: ORAL_TABLET | ORAL | 1 refills | Status: DC

## 2023-10-16 MED ORDER — BUPROPION HCL ER (SR) 200 MG PO TB12
200.0000 mg | ORAL_TABLET | Freq: Two times a day (BID) | ORAL | 1 refills | Status: DC
Start: 2023-10-16 — End: 2024-03-18

## 2023-10-16 NOTE — Progress Notes (Signed)
 BH MD/PA/NP OP Progress Note  10/16/2023 3:57 PM Nicole Rodriguez  MRN:  [id number] Interview was conducted in person and I verified that I was speaking with the correct person using two identifiers. I discussed the limitations of evaluation and management by telemedicine and  the availability of in person appointments. Patient expressed understanding and agreed to proceed. Participants in the visit: patient (location - home); physician (location - home office).  Chief Complaint:  Panic attacks.  Nicole Rodriguez.  Visit Diagnosis:   Today the patient is seen in the office.  She continues to do very very well.  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  She has been there a week and likes it a lot.  The patient recently this year had elbow surgery and has recovered well.  The patient denies daily depression.  Also since I have seen her she has been married to mitigate.  They share an 34-year-old girl named Nicole Rodriguez.  The mother of Nicole Rodriguez is actually Nicole Rodriguez the ex-partner of the patient.  Fortunately [employer] and the patient all get along well.  Nicole Rodriguez is doing great.  The patient denies daily depression.  She denies problems with sleep or appetite.  Her energy level is good.  She drinks no alcohol and uses no drugs.  Financially she is very stable.  Patient takes her medicine as prescribed.  I connected with Nicole Rodriguez on 10/16/23 at  3:30 PM EDT by telephone and verified that I am speaking with the correct person using two identifiers.  Location: Patient: home Provider: office   I discussed the limitations, risks, security and privacy concerns of performing an evaluation and management service by telephone and the availability of in person appointments. I also discussed with the patient that there may be a patient responsible charge related  to this service. The patient expressed understanding and agreed to proceed.      I discussed the assessment and treatment plan with the patient. The patient was provided an opportunity to ask questions and all were answered. The patient agreed with the plan and demonstrated an understanding of the instructions.   The patient was advised to call back or seek an in-person evaluation if the symptoms worsen or if the condition fails to improve as anticipated.  I provided 30 minutes of non-face-to-face time during this encounter.   [name], MD    ICD-10-CM   1. Bipolar 2 disorder (HCC)  F31.81 clonazePAM (KLONOPIN) 1 MG tablet      Past Psychiatric History: Please see intake H&P.  Past Medical History:  Past Medical History:  Diagnosis Date   Asthma    Bipolar disorder (HCC)    Generalized anxiety disorder    GERD (gastroesophageal reflux disease)    Phreesia 08/17/2020   IBS (irritable bowel syndrome)    Plantar fasciitis 10/15/2018   Rectal bleeding 09/24/2019   Social anxiety disorder     Past Surgical History:  Procedure Laterality Date   APPENDECTOMY     BREAST LUMPECTOMY     COLONOSCOPY WITH PROPOFOL N/A 12/03/2019   Procedure: COLONOSCOPY WITH PROPOFOL;  Surgeon: Pasty Spillers, MD;  Location: [facility];  Service: Endoscopy;  Laterality: N/A;   CYSTOSCOPY W/ URETERAL STENT PLACEMENT Right 08/31/2021   Procedure: CYSTOSCOPY WITH BILATERAL RETROGRADE PYELOGRAM;  Surgeon: Milderd Meager., MD;  Location: [facility];  Service: Urology;  Laterality: Right;   ESOPHAGOGASTRODUODENOSCOPY (EGD) WITH PROPOFOL N/A 12/03/2019   Procedure: ESOPHAGOGASTRODUODENOSCOPY (EGD) WITH PROPOFOL;  Surgeon: Irby Mannan, MD;  Location: [facility];  Service: Endoscopy;  Laterality: N/A;   eunlar relocation Left 11/07/2022   FOOT SURGERY     URETEROSCOPY Right 08/31/2021   Procedure: URETEROSCOPY;  Surgeon: Mellie Sprinkle., MD;  Location: [facility];  Service:  Urology;  Laterality: Right;    Family Psychiatric History: Reviewed.  Family History:  Family History  Problem Relation Age of Onset   Allergic rhinitis Father    Asthma Father    Crohn's disease Sister        half sister   Anxiety disorder Maternal Grandmother    Depression Maternal Grandmother    Diverticulitis Paternal Grandmother    Diabetes Other    Heart failure Other    Colon cancer Neg Hx    Celiac disease Neg Hx     Social History:  Social History   Socioeconomic History   Marital status: Married    Spouse name: Not on file   Number of children: 1   Years of education: Not on file   Highest education level: Not on file  Occupational History   Not on file  Tobacco Use   Smoking status: Never    Passive exposure: Never   Smokeless tobacco: Never  Vaping Use   Vaping status: Never Used  Substance and Sexual Activity   Alcohol use: Yes    Comment: socially    Drug use: No   Sexual activity: Yes    Birth control/protection: None  Other Topics Concern   Not on file  Social History Narrative   Daughter 32 years old.   Social Drivers of Corporate investment banker Strain: Not on file  Food Insecurity: Not on file  Transportation Needs: Not on file  Physical Activity: Not on file  Stress: Not on file  Social Connections: Not on file    Allergies:  Allergies  Allergen Reactions   Amoxicillin Hives   Asa [Aspirin] Hives   Cefoxitin Itching and Rash   Sulfa Antibiotics Hives    Metabolic Disorder Labs: Lab Results  Component Value Date   HGBA1C 5.2 02/01/2017   No results found for: "PROLACTIN" Lab Results  Component Value Date   CHOL 159 02/01/2017   TRIG 85 02/01/2017   HDL 44 02/01/2017   CHOLHDL 3.6 02/01/2017   LDLCALC 98 02/01/2017   LDLCALC 112 (H) 09/14/2016   Lab Results  Component Value Date   TSH 1.500 09/14/2016    Therapeutic Level Labs: No results found for: "LITHIUM" No results found for: "VALPROATE" No results  found for: "CBMZ"  Current Medications: Current Outpatient Medications  Medication Sig Dispense Refill   albuterol (PROVENTIL) (2.5 MG/3ML) 0.083% nebulizer solution Take 3 mLs (2.5 mg total) by nebulization every 6 (six) hours as needed for wheezing or shortness of breath. 75 mL 3   albuterol (VENTOLIN HFA) 108 (90 Base) MCG/ACT inhaler Inhale 2 puffs into the lungs every 6 (six) hours as needed for wheezing or shortness of breath. 8 g 1   Azelastine HCl 137 MCG/SPRAY SOLN Place 2 sprays into both nostrils 2 (two) times daily as needed. 30 mL 3   buPROPion (WELLBUTRIN SR) 200 MG 12 hr tablet Take 1 tablet (200 mg total) by mouth 2 (two) times daily. 270 tablet 1   clonazePAM (KLONOPIN) 1 MG tablet 2 qhs 180 tablet  1   dicyclomine (BENTYL) 20 MG tablet Take 1 tablet (20 mg total) by mouth 2 (two) times daily. 20 tablet 0   famotidine (PEPCID) 40 MG tablet Take 1 tablet (40 mg total) by mouth daily. 30 tablet 0   FASENRA PEN 30 MG/ML SOAJ Inject 1 mL (30 mg total) into the skin every 8 (eight) weeks. Benralizumab. For severe eosinophilic asthma 1 mL 6   fluticasone (FLONASE) 50 MCG/ACT nasal spray SPRAY 2 SPRAYS INTO EACH NOSTRIL EVERY DAY 16 g 5   fluticasone-salmeterol (ADVAIR DISKUS) 250-50 MCG/ACT AEPB Inhale 1 puff into the lungs in the morning and at bedtime. 1 each 5   levocetirizine (XYZAL) 5 MG tablet TAKE 1 TABLET BY MOUTH DAILY AS NEEDED FOR ALLERGIES. 30 tablet 0   montelukast (SINGULAIR) 10 MG tablet TAKE 1 TABLET BY MOUTH EVERYDAY AT BEDTIME 30 tablet 0   omeprazole (PRILOSEC) 20 MG capsule Take 1 capsule (20 mg total) by mouth daily. 21 capsule 0   ondansetron (ZOFRAN-ODT) 4 MG disintegrating tablet Take 1 tablet (4 mg total) by mouth every 8 (eight) hours as needed for nausea or vomiting. 20 tablet 0   pantoprazole (PROTONIX) 40 MG tablet Take 1 tablet (40 mg total) by mouth daily. 30 tablet 0   SUMAtriptan (IMITREX) 50 MG tablet Take 1 tablet (50 mg total) by mouth every 2 (two)  hours as needed for migraine. May repeat in 2 hours if headache persists or recurs. 10 tablet 0   No current facility-administered medications for this visit.      Psychiatric Specialty Exam: Review of Systems  Psychiatric/Behavioral:  The patient is nervous/anxious.   All other systems reviewed and are negative.   Blood pressure 138/83, pulse 77, height 5\' 2"  (1.575 m), weight 188 lb (85.3 kg).Body mass index is 34.39 kg/m.  General Appearance: NA  Eye Contact:  NA  Speech:  Clear and Coherent and Normal Rate  Volume:  Normal  Mood:  Anxious  Affect:  NA  Thought Process:  Goal Directed  Orientation:  Full (Time, Place, and Person)  Thought Content: Rumination   Suicidal Thoughts:  No  Homicidal Thoughts:  No  Memory:  Immediate;   Good Recent;   Good Remote;   Good  Judgement:  Good  Insight:  Fair  Psychomotor Activity:  NA  Concentration:  Concentration: Fair  Recall:  Good  Fund of Knowledge: Good  Language: Good  Akathisia:  Negative  Handed:  Right  AIMS (if indicated): not done  Assets:  Communication Skills Desire for Improvement Financial Resources/Insurance Housing Social Support Talents/Skills  ADL's:  Intact  Cognition: WNL  Sleep:  Good   Screenings: GAD-7    Flowsheet Row Office Visit from 06/18/2019 in [place] Office Visit from 06/05/2019 in [place] Office Visit from 04/01/2019 in [facility]  Total GAD-7 Score 20 19 0      PHQ2-9    Flowsheet Row Office Visit from 03/08/2022 in [facility] Office Visit from 06/30/2021 in [facility] Video Visit from 11/15/2020 in [facility] Office Visit from 08/20/2020 in [facility] Office Visit from 06/18/2019 in [place]  PHQ-2 Total Score 0 0 0 0 5  PHQ-9 Total Score 0 -- -- 0 20      Flowsheet Row ED from 09/02/2023 in [facility] Emergency Department at  [facility] ED from 07/24/2023 in [facility]  at [facility] ED from 02/16/2023 in [facility] Emergency Department at [facility]  C-SSRS RISK CATEGORY No Risk No Risk No Risk        Assessment and Plan:        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  She will continue taking Wellbutrin as prescribed.  She is not in any therapy at this time.  Her second problem is insomnia.  The patient takes 1 mg of Klonopin which works very well.  The patient is emotionally very stable.  Physically she is stable.  She will return to see me in 5 months.   Dx: Bipolar 2 disorder rapid cycling; Mixed anxiety disorder (panic disorder' GAD, social anxiety)   Plan:  Continue Lamictal to 200 mg at HS, Latuda 40 mg at HS, paroxetine 20 mg in PM, trazodone 50 mg prn sleep and bupropion SR 200 mg bid for fatigue. I will stop lorazepam 2 mg prn anxiety (up to tid) and start clonazepam 1 mg bid scheduled initially (then advised to start using it prn after anxiety decreases). We shall write her an excuse from work note for 3/26-4/18/22 period.  Next appointment with a new provider in 1 month. The plan was discussed with patient who had an opportunity to ask questions and these were all answered. I spend 15 minutes in phone consultation with the patient.    Delorse Fey, MD 10/16/2023, 3:57 PM

## 2023-10-23 NOTE — Patient Instructions (Incomplete)
 Asthma -not well controlled Continue Advair 250/50 mcg-1 puff twice a day to prevent cough or wheeze.Rinse mouth out after Continue Singulair  (montelukast ) 10 mg once a day.  Continue albuterol  2 puffs every 4 hours as needed for cough or wheeze OR Instead use albuterol  0.083% solution via nebulizer one unit vial every 4 hours as needed for cough or wheeze Restart Fasenra  once every 8 weeks for asthma control. I will send a message to Tammy,our biologics coordinator and she will be in touch with you  Asthma control goals:  Full participation in all desired activities (may need albuterol  before activity) Albuterol  use two time or less a week on average (not counting use with activity) Cough interfering with sleep two time or less a month Oral steroids no more than once a year No hospitalizations   Allergic rhinitis Continue avoidance measures directed toward grass pollen as listed below Continue Xyzal  5 mg once a day as needed for a runny nose or itch Continue Flonase  2 sprays in each nostril once a day as needed for a stuffy nose Continue azelastine  2 sprays in each nostril twice a day as needed for a runny nose Consider saline nasal rinses as needed for nasal symptoms. Use this before any medicated nasal sprays for best result  Reflux- not well ocontrolled Continue dietary and lifestyle modifications as listed below Continue pantoprazole  40 mg at night Start a 30 day trial of Pepcid  (famotidine )  20 mg in the morning to see if this decreases your symptoms. Let me know how your symptoms are doing after the trial  Call the clinic if this treatment plan is not working well for you  Follow up in the clinic in 3 months or sooner if needed.  Reducing Pollen Exposure The American Academy of Allergy , Asthma and Immunology suggests the following steps to reduce your exposure to pollen during allergy  seasons. Do not hang sheets or clothing out to dry; pollen may collect on these items. Do not  mow lawns or spend time around freshly cut grass; mowing stirs up pollen. Keep windows closed at night.  Keep car windows closed while driving. Minimize morning activities outdoors, a time when pollen counts are usually at their highest. Stay indoors as much as possible when pollen counts or humidity is high and on windy days when pollen tends to remain in the air longer. Use air conditioning when possible.  Many air conditioners have filters that trap the pollen spores. Use a HEPA room air filter to remove pollen form the indoor air you breathe.

## 2023-10-24 ENCOUNTER — Encounter: Payer: Self-pay | Admitting: Family

## 2023-10-24 ENCOUNTER — Other Ambulatory Visit: Payer: Self-pay

## 2023-10-24 ENCOUNTER — Encounter: Payer: Self-pay | Admitting: Family Medicine

## 2023-10-24 ENCOUNTER — Ambulatory Visit: Admitting: Family

## 2023-10-24 VITALS — BP 118/82 | HR 84 | Temp 98.3°F | Resp 16 | Ht 63.0 in | Wt 185.3 lb

## 2023-10-24 DIAGNOSIS — J4550 Severe persistent asthma, uncomplicated: Secondary | ICD-10-CM | POA: Diagnosis not present

## 2023-10-24 DIAGNOSIS — J302 Other seasonal allergic rhinitis: Secondary | ICD-10-CM

## 2023-10-24 DIAGNOSIS — K219 Gastro-esophageal reflux disease without esophagitis: Secondary | ICD-10-CM | POA: Diagnosis not present

## 2023-10-24 MED ORDER — LEVOCETIRIZINE DIHYDROCHLORIDE 5 MG PO TABS: ORAL_TABLET | ORAL | 5 refills | Status: DC

## 2023-10-24 MED ORDER — ALBUTEROL SULFATE HFA 108 (90 BASE) MCG/ACT IN AERS: 2.0000 | INHALATION_SPRAY | Freq: Four times a day (QID) | RESPIRATORY_TRACT | 1 refills | Status: DC | PRN

## 2023-10-24 MED ORDER — FAMOTIDINE 20 MG PO TABS: ORAL_TABLET | ORAL | 0 refills | Status: DC

## 2023-10-24 MED ORDER — FLUTICASONE-SALMETEROL 250-50 MCG/ACT IN AEPB: INHALATION_SPRAY | RESPIRATORY_TRACT | 5 refills | Status: DC

## 2023-10-24 MED ORDER — AZELASTINE HCL 137 MCG/SPRAY NA SOLN: NASAL | 3 refills | Status: AC

## 2023-10-24 MED ORDER — FLUTICASONE PROPIONATE 50 MCG/ACT NA SUSP: NASAL | 5 refills | Status: AC

## 2023-10-24 MED ORDER — MONTELUKAST SODIUM 10 MG PO TABS: 10.0000 mg | ORAL_TABLET | Freq: Every day | ORAL | 5 refills | Status: DC

## 2023-10-24 NOTE — Progress Notes (Signed)
 [address] [place] [address] Dept: [phone]  FOLLOW UP NOTE  Patient ID: Nicole Rodriguez, female    DOB: 07-28-1990  Age: 33 y.o. MRN: [id number] Date of Office Visit: 10/24/2023  Assessment  Chief Complaint: Asthma and Allergies  HPI Nicole Rodriguez is a 33 year old female who presents today for follow-up of moderate persistent asthma, seasonal allergic rhinitis, and gastroesophageal reflux disease.  She was last seen on Dec 01, 2022 by Marinus Sic, FNP.  She reports that in May 2024 she had surgery on her left elbow due to a pinched nerve.  Otherwise she denies any new diagnosis or surgeries.  Asthma: She reports that she would like to restart Fasenra .  She thinks that her last injection was approximately 4 to 5 months ago.  She has given our office her new insurance card.  Since being off Fasenra  she has noticed that she has had a cough that can be productive and nonproductive at times and some wheezing.  What she is coughing up is clear in color.  When she was on Fasenra  it stopped the excess mucus and the wheezing.  She denies tightness in chest, shortness of breath, nocturnal awakenings due to breathing problems, fever, and chills.  Since her last office visit she has not made any trips to the emergency room or urgent care due to breathing problems.  She has received steroids for her neck issues, but not for asthma.  She uses her albuterol  approximately a month.  She continues to take Advair 250/50 mcg 1 puff twice a day and Singulair  10 mg once a day.  Allergic rhinitis: She reports clear rhinorrhea, nasal congestion, and postnasal drip.  She has not been treated for any sinus infections since we last saw her.  She is currently taking Singulair  10 mg daily, Xyzal  5 mg daily, Flonase  2 sprays each nostril once a day, and azelastine  2 sprays each nostril twice a day.  Reflux is reported as being okay.  She reports that it has been flaring some.  She is typically having symptoms 2-3  times a week.  She is currently taking pantoprazole  40 mg once a day.  Her reflux flares are occurring different times of the day.  Drug Allergies:  Allergies  Allergen Reactions   Amoxicillin Hives   Asa [Aspirin] Hives   Cefoxitin Itching and Rash   Sulfa Antibiotics Hives    Review of Systems: Negative except as per HPI   Physical Exam: BP 118/82 (BP Location: Right Arm, Patient Position: Sitting, Cuff Size: Normal)   Pulse 84   Temp 98.3 F (36.8 C) (Temporal)   Resp 16   Ht 5\' 3"  (1.6 m)   Wt 185 lb 4.8 oz (84.1 kg)   SpO2 100%   BMI 32.82 kg/m    Physical Exam Exam conducted with a chaperone present.  Constitutional:      Appearance: Normal appearance.  HENT:     Head: Normocephalic and atraumatic.     Comments: Pharynx normal, eyes normal, ears normal, nose: Bilateral lower turbinates mildly edematous with no drainage noted    Right Ear: Tympanic membrane, ear canal and external ear normal.     Left Ear: Tympanic membrane, ear canal and external ear normal.     Mouth/Throat:     Mouth: Mucous membranes are moist.     Pharynx: Oropharynx is clear.  Eyes:     Conjunctiva/sclera: Conjunctivae normal.  Cardiovascular:     Rate and Rhythm: Regular rhythm.  Heart sounds: Normal heart sounds.  Pulmonary:     Effort: Pulmonary effort is normal.     Breath sounds: Normal breath sounds.     Comments: Lungs clear to auscultation Musculoskeletal:     Cervical back: Neck supple.  Skin:    General: Skin is warm.  Neurological:     Mental Status: She is alert and oriented to person, place, and time.  Psychiatric:        Mood and Affect: Mood normal.        Behavior: Behavior normal.        Thought Content: Thought content normal.        Judgment: Judgment normal.     Diagnostics: FVC 3.35 L (92%), FEV1 2.73 L (89%), FEV1/FVC 0.81.  Spirometry indicates normal spirometry.  Assessment and Plan: 1. Other seasonal allergic rhinitis   2. Not well controlled  severe persistent asthma   3. Gastroesophageal reflux disease, unspecified whether esophagitis present     No orders of the defined types were placed in this encounter.   Patient Instructions  Asthma -not well controlled Continue Advair 250/50 mcg-1 puff twice a day to prevent cough or wheeze.Rinse mouth out after Continue Singulair  (montelukast ) 10 mg once a day.  Continue albuterol  2 puffs every 4 hours as needed for cough or wheeze OR Instead use albuterol  0.083% solution via nebulizer one unit vial every 4 hours as needed for cough or wheeze Restart Fasenra  once every 8 weeks for asthma control. I will send a message to Tammy,our biologics coordinator and she will be in touch with you  Asthma control goals:  Full participation in all desired activities (may need albuterol  before activity) Albuterol  use two time or less a week on average (not counting use with activity) Cough interfering with sleep two time or less a month Oral steroids no more than once a year No hospitalizations   Allergic rhinitis Continue avoidance measures directed toward grass pollen as listed below Continue Xyzal  5 mg once a day as needed for a runny nose or itch Continue Flonase  2 sprays in each nostril once a day as needed for a stuffy nose Continue azelastine  2 sprays in each nostril twice a day as needed for a runny nose Consider saline nasal rinses as needed for nasal symptoms. Use this before any medicated nasal sprays for best result  Reflux- not well ocontrolled Continue dietary and lifestyle modifications as listed below Continue pantoprazole  40 mg at night Start a 30 day trial of Pepcid  (famotidine )  20 mg in the morning to see if this decreases your symptoms. Let me know how your symptoms are doing after the trial  Call the clinic if this treatment plan is not working well for you  Follow up in the clinic in 3 months or sooner if needed.  Reducing Pollen Exposure The American Academy of  Allergy , Asthma and Immunology suggests the following steps to reduce your exposure to pollen during allergy  seasons. Do not hang sheets or clothing out to dry; pollen may collect on these items. Do not mow lawns or spend time around freshly cut grass; mowing stirs up pollen. Keep windows closed at night.  Keep car windows closed while driving. Minimize morning activities outdoors, a time when pollen counts are usually at their highest. Stay indoors as much as possible when pollen counts or humidity is high and on windy days when pollen tends to remain in the air longer. Use air conditioning when possible.  Many air conditioners have  filters that trap the pollen spores. Use a HEPA room air filter to remove pollen form the indoor air you breathe.   Return in about 3 months (around 01/23/2024), or if symptoms worsen or fail to improve.    Thank you for the opportunity to care for this patient.  Please do not hesitate to contact me with questions.  Tinnie Forehand, FNP [facility] 

## 2023-10-24 NOTE — Progress Notes (Signed)
 Patient will need to have update eos to qualify last was last month with 0 eos so not sure she will qualify or was she on steroids

## 2023-10-25 ENCOUNTER — Other Ambulatory Visit: Payer: Self-pay

## 2023-10-25 ENCOUNTER — Telehealth: Payer: Self-pay | Admitting: Family

## 2023-10-25 DIAGNOSIS — J4550 Severe persistent asthma, uncomplicated: Secondary | ICD-10-CM

## 2023-10-25 MED ORDER — PANTOPRAZOLE SODIUM 40 MG PO TBEC: 40.0000 mg | DELAYED_RELEASE_TABLET | Freq: Every day | ORAL | 1 refills | Status: DC

## 2023-10-25 NOTE — Telephone Encounter (Signed)
 Please let [place] know that she needs to get an updated cbc with diff to try to re-qualify for Fasenra . She can come by our office and get this completed.  Tinnie Forehand, FNP [facility]

## 2023-10-25 NOTE — Telephone Encounter (Signed)
 Left a VM requesting a call back as well as informed I would send a [employer].

## 2023-10-30 ENCOUNTER — Ambulatory Visit: Admitting: Family Medicine

## 2023-10-31 ENCOUNTER — Encounter: Payer: Self-pay | Admitting: General Practice

## 2023-11-01 ENCOUNTER — Other Ambulatory Visit: Payer: Self-pay

## 2023-11-01 DIAGNOSIS — R1013 Epigastric pain: Secondary | ICD-10-CM

## 2023-11-01 DIAGNOSIS — K219 Gastro-esophageal reflux disease without esophagitis: Secondary | ICD-10-CM

## 2023-11-07 ENCOUNTER — Telehealth: Payer: Self-pay

## 2023-11-07 NOTE — Telephone Encounter (Signed)
 Medication refill request from Pharmacy, Patient was seen on [facility] unit on 3.23.25 VV with provider Bella Bowman PA. She was prescribed 40 mg Famotidine  40 mg tablet.   Pharmacy request for 90 day supply has been denied by Provider. This medication has been filled by FNP, Tinnie Forehand on 04.23.2025 for 20 mg tablets, Quantity of 30.

## 2023-11-29 ENCOUNTER — Ambulatory Visit: Admitting: Family Medicine

## 2024-01-03 ENCOUNTER — Ambulatory Visit (INDEPENDENT_AMBULATORY_CARE_PROVIDER_SITE_OTHER): Admitting: Allergy & Immunology

## 2024-01-03 ENCOUNTER — Encounter: Payer: Self-pay | Admitting: Allergy & Immunology

## 2024-01-03 ENCOUNTER — Other Ambulatory Visit: Payer: Self-pay

## 2024-01-03 DIAGNOSIS — J4540 Moderate persistent asthma, uncomplicated: Secondary | ICD-10-CM | POA: Diagnosis not present

## 2024-01-03 DIAGNOSIS — K219 Gastro-esophageal reflux disease without esophagitis: Secondary | ICD-10-CM | POA: Diagnosis not present

## 2024-01-03 DIAGNOSIS — J302 Other seasonal allergic rhinitis: Secondary | ICD-10-CM

## 2024-01-03 NOTE — Progress Notes (Signed)
 FOLLOW UP  Date of Service/Encounter:  01/03/24   Assessment:   Moderate persistent asthma, uncomplicated - with AEC 300 March 2022, previously doing well on Faserna, but treatment has lapsed due to insurance coverage   Failed Breo and Symbicort (starting Advair today)   Chronic rhinitis - with minimally reactive testing to grasses   Recurrent prednisone  courses in the last 6 months - improved with initiation of Fasenra   Plan/Recommendations:   1. Moderate persistent asthma, uncomplicated - Lung testing looks awesome today. - We are going to restart the Fasenra  since it has been so long.  - We will need to get another CBC to see we can find some elevated eosinophils .  - Consent signed today again just in case we need it.  - Daily controller medication(s): Advair 250/72mcg one puff twice daily - Prior to physical activity: AirSupra  2 puffs 10-15 minutes before physical activity. - Rescue medications: AirSupra  two puffs every 4-6 hours as needed.  - Asthma control goals:  * Full participation in all desired activities (may need albuterol  before activity) * Albuterol  use two time or less a week on average (not counting use with activity) * Cough interfering with sleep two time or less a month * Oral steroids no more than once a year * No hospitalizations  2. Chronic rhinitis (grasses) - Continue with: Xyzal  (levocetirizine) 5mg  tablet once daily, Singulair  (montelukast ) 10mg  daily, and Flonase  (fluticasone ) one spray per nostril daily and Astelin  (azelastine ) 2 sprays per nostril 1-2 times daily as needed - You can use an extra dose of the antihistamine, if needed, for breakthrough symptoms.  - Consider nasal saline rinses 1-2 times daily to remove allergens from the nasal cavities as well as help with mucous clearance (this is especially helpful to do before the nasal sprays are given)  3. GERD  - Stop the Protonix  and start Voquenza 10mg  daily. - This targets the acid  producing cells using a different mechanism.  - Call us  and let us  know how you think it is working.   4. Return in about 6 months (around 07/05/2024). You can have the follow up appointment with Dr. Iva or a Nurse Practicioner (our Nurse Practitioners are excellent and always have Physician oversight!).    Subjective:   Nicole Rodriguez is a 33 y.o. female presenting today for follow up of  Chief Complaint  Patient presents with   Establish Care   Follow-up   Asthma    Asthma doing well with medication    Nicole Rodriguez has a history of the following: Patient Active Problem List   Diagnosis Date Noted   Severe persistent asthma, uncomplicated 11/01/2022   Viral URI 07/26/2022   Intermittent palpitations 08/20/2020   Gastric polyp    GAD (generalized anxiety disorder) 07/19/2019   Social anxiety disorder 07/19/2019   Panic disorder 07/19/2019   Bipolar 2 disorder (HCC) 07/19/2019   Other seasonal allergic rhinitis 08/19/2018   GERD (gastroesophageal reflux disease) 08/19/2018   Asthma 11/29/2017   Breast mass, right 03/18/2014   Numbness and tingling in both hands 01/26/2014    History obtained from: chart review and patient.  Discussed the use of AI scribe software for clinical note transcription with the patient and/or guardian, who gave verbal consent to proceed.  Nicole Rodriguez is a 33 y.o. female presenting for a follow up visit.  She was last seen in April 2025.  At that time, we continue with Advair 250/50 micrograms 1 puff twice daily as well as  Singulair  and albuterol .  It was recommended that she restart her Fasenra  since that was working so well.  For her allergic rhinitis, we continue with Xyzal  as well as Flonase  and Astelin .  Her reflux was not under good control.  She was continued on pantoprazole  and started on Pepcid  20 mg in the morning.  Since last visit, she has been fairly good.   Asthma/Respiratory Symptom History: She seeks to resume her Fasenra   treatment after a lapse due to a job change. She last received Fasenra  before November 2024. Since discontinuing Fasenra , she has not required prednisone  but has been using her rescue inhaler approximately four times a week. She experiences nightly coughing, which is affecting her quality of life. She is currently taking Advair, one puff twice a day, and has been very consistent with this medication. She has been using her rescue inhaler frequently and her quality of life has taken a hit without the Fasenra  on board.   Allergic Rhinitis Symptom History: She also takes Xyzal  for environmental allergies, which are currently stable. She has never been on allergy  shots. Testing has only ever been positive to grasses, although she has symptoms during the entirety of the year.   GERD Symptom History: Her acid reflux has been flaring up despite being on Protonix  for a long time. She has previously tried an additional medication without significant improvement.  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 She works a two, two, three schedule, with shifts from 6 AM to 6 PM. She is also involved in travel softball tournaments with her family.   Otherwise, there have been no changes to her past medical history, surgical history, family history, or social history.    Review of systems otherwise negative other than that mentioned in the HPI.    Objective:   Vitals reviewed and are within normal limits   Physical Exam Vitals reviewed.  Constitutional:      Appearance: She is well-developed.     Comments: Pleasant and cooperative.  HENT:     Head: Normocephalic and atraumatic.     Right Ear: Tympanic membrane, ear canal and external ear normal. No drainage, swelling or tenderness. Tympanic membrane is not injected, scarred, erythematous, retracted or bulging.     Left Ear: Tympanic membrane, ear canal and external ear  normal. No drainage, swelling or tenderness. Tympanic membrane is not injected, scarred, erythematous, retracted or bulging.     Nose: No nasal deformity, septal deviation, mucosal edema or rhinorrhea.     Right Turbinates: Enlarged, swollen and pale.     Left Turbinates: Enlarged, swollen and pale.     Right Sinus: No maxillary sinus tenderness or frontal sinus tenderness.     Left Sinus: No maxillary sinus tenderness or frontal sinus tenderness.     Mouth/Throat:     Mouth: Mucous membranes are not pale and not dry.     Pharynx: Uvula midline.  Eyes:     General:        Right eye: No discharge.        Left eye: No discharge.     Conjunctiva/sclera: Conjunctivae normal.     Right eye: Right conjunctiva is not injected. No chemosis.    Left eye: Left conjunctiva is not injected. No chemosis.    Pupils: Pupils are equal, round, and reactive to light.  Cardiovascular:     Rate and Rhythm: Normal rate and regular  rhythm.     Heart sounds: Normal heart sounds.  Pulmonary:     Effort: Pulmonary effort is normal. No tachypnea, accessory muscle usage or respiratory distress.     Breath sounds: Normal breath sounds. No wheezing, rhonchi or rales.     Comments: Moving air well in all lung fields.  No increased work of breathing. Chest:     Chest wall: No tenderness.  Abdominal:     Tenderness: There is no abdominal tenderness. There is no guarding or rebound.  Lymphadenopathy:     Head:     Right side of head: No submandibular, tonsillar or occipital adenopathy.     Left side of head: No submandibular, tonsillar or occipital adenopathy.     Cervical: No cervical adenopathy.  Skin:    Coloration: Skin is not pale.     Findings: No abrasion, erythema, petechiae or rash. Rash is not papular, urticarial or vesicular.  Neurological:     Mental Status: She is alert.  Psychiatric:        Behavior: Behavior is cooperative.      Diagnostic studies:    Spirometry: results normal (FEV1:  2.65/93%, FVC: 3.29/97%, FEV1/FVC: 81%).    Spirometry consistent with normal pattern.   Allergy  Studies:  none           Marty Shaggy, MD  [name] 

## 2024-01-03 NOTE — Patient Instructions (Addendum)
 1. Moderate persistent asthma, uncomplicated - Lung testing looks awesome today. - We are going to restart the Fasenra  since it has been so long.  - We will need to get another CBC to see we can find some elevated eosinophils .  - Consent signed today again just in case we need it.  - Daily controller medication(s): Advair 250/72mcg one puff twice daily - Prior to physical activity: AirSupra 2 puffs 10-15 minutes before physical activity. - Rescue medications: AirSupra two puffs every 4-6 hours as needed.  - Asthma control goals:  * Full participation in all desired activities (may need albuterol  before activity) * Albuterol  use two time or less a week on average (not counting use with activity) * Cough interfering with sleep two time or less a month * Oral steroids no more than once a year * No hospitalizations  2. Chronic rhinitis (grasses) - Continue with: Xyzal  (levocetirizine) 5mg  tablet once daily, Singulair  (montelukast ) 10mg  daily, and Flonase  (fluticasone ) one spray per nostril daily and Astelin  (azelastine ) 2 sprays per nostril 1-2 times daily as needed - You can use an extra dose of the antihistamine, if needed, for breakthrough symptoms.  - Consider nasal saline rinses 1-2 times daily to remove allergens from the nasal cavities as well as help with mucous clearance (this is especially helpful to do before the nasal sprays are given)  3. GERD  - Stop the Protonix  and start Voquenza 10mg  daily. - This targets the acid producing cells using a different mechanism.  - Call us  and let us  know how you think it is working.   4. Return in about 6 months (around 07/05/2024). You can have the follow up appointment with Dr. Iva or a Nurse Practicioner (our Nurse Practitioners are excellent and always have Physician oversight!).    Please inform us  of any Emergency Department visits, hospitalizations, or changes in symptoms. Call us  before going to the ED for breathing or allergy   symptoms since we might be able to fit you in for a sick visit. Feel free to contact us  anytime with any questions, problems, or concerns.  It was a pleasure to see you again today!  Websites that have reliable patient information: 1. American Academy of Asthma, Allergy , and Immunology: www.aaaai.org 2. Food Allergy  Research and Education (FARE): foodallergy.org 3. Mothers of Asthmatics: http://www.asthmacommunitynetwork.org 4. [employer] of Allergy , Asthma, and Immunology: www.acaai.org      "Like" us  on Facebook and Instagram for our latest updates!      A healthy democracy works best when [employer] participate! Make sure you are registered to vote! If you have moved or changed any of your contact information, you will need to get this updated before voting! Scan the QR codes below to learn more!

## 2024-01-04 ENCOUNTER — Encounter: Payer: Self-pay | Admitting: Allergy & Immunology

## 2024-01-04 LAB — CBC WITH DIFFERENTIAL/PLATELET
Basophils Absolute: 0 x10E3/uL (ref 0.0–0.2)
Basos: 0 %
EOS (ABSOLUTE): 0.2 x10E3/uL (ref 0.0–0.4)
Eos: 2 %
Hematocrit: 38.3 % (ref 34.0–46.6)
Hemoglobin: 12.7 g/dL (ref 11.1–15.9)
Immature Grans (Abs): 0 x10E3/uL (ref 0.0–0.1)
Immature Granulocytes: 0 %
Lymphocytes Absolute: 3.6 x10E3/uL — ABNORMAL HIGH (ref 0.7–3.1)
Lymphs: 31 %
MCH: 30 pg (ref 26.6–33.0)
MCHC: 33.2 g/dL (ref 31.5–35.7)
MCV: 90 fL (ref 79–97)
Monocytes Absolute: 0.7 x10E3/uL (ref 0.1–0.9)
Monocytes: 6 %
Neutrophils Absolute: 7 x10E3/uL (ref 1.4–7.0)
Neutrophils: 61 %
Platelets: 326 x10E3/uL (ref 150–450)
RBC: 4.24 x10E6/uL (ref 3.77–5.28)
RDW: 12.9 % (ref 11.7–15.4)
WBC: 11.5 x10E3/uL — ABNORMAL HIGH (ref 3.4–10.8)

## 2024-01-07 ENCOUNTER — Encounter: Payer: Self-pay | Admitting: Allergy & Immunology

## 2024-01-07 ENCOUNTER — Other Ambulatory Visit: Payer: Self-pay

## 2024-01-07 MED ORDER — MONTELUKAST SODIUM 10 MG PO TABS: 10.0000 mg | ORAL_TABLET | Freq: Every day | ORAL | 5 refills | Status: AC

## 2024-01-07 MED ORDER — FLUTICASONE-SALMETEROL 250-50 MCG/ACT IN AEPB: INHALATION_SPRAY | RESPIRATORY_TRACT | 5 refills | Status: AC

## 2024-01-07 MED ORDER — AIRSUPRA 90-80 MCG/ACT IN AERO
2.0000 | INHALATION_SPRAY | RESPIRATORY_TRACT | 1 refills | Status: AC | PRN
Start: 2024-01-07 — End: ?

## 2024-01-07 MED ORDER — LEVOCETIRIZINE DIHYDROCHLORIDE 5 MG PO TABS: ORAL_TABLET | ORAL | 5 refills | Status: AC

## 2024-01-07 MED ORDER — VOQUEZNA 10 MG PO TABS: 1.0000 | ORAL_TABLET | Freq: Every day | ORAL | 5 refills | Status: DC

## 2024-01-08 NOTE — Addendum Note (Signed)
 Addended by: AZALEA, Cassy Sprowl on: 01/08/2024 04:01 PM   Modules accepted: Orders

## 2024-01-09 ENCOUNTER — Telehealth: Payer: Self-pay | Admitting: *Deleted

## 2024-01-09 NOTE — Telephone Encounter (Signed)
 Patient Ins has denied approval for Fasenra  due to the following: PFT  confirming dx of asthma with large reversibility after bronchodilator admin, serial measurements of lung function demonstrating high variability in FEV1 or PEF over time. Notes on recent exacerbation requiring steroids (last I found was Jan 2024) has to be within last year.   This is the one Ins that is hardest to get auth from.

## 2024-01-09 NOTE — Telephone Encounter (Signed)
 So I need ALL of those items?

## 2024-01-09 NOTE — Telephone Encounter (Signed)
-----   Message from Marty Morton Shaggy sent at 01/07/2024  8:47 AM EDT ----- Patient wants Fasenra  back on board. AEC 200.

## 2024-01-10 ENCOUNTER — Ambulatory Visit: Admitting: Nurse Practitioner

## 2024-01-10 ENCOUNTER — Telehealth: Payer: Self-pay

## 2024-01-10 NOTE — Telephone Encounter (Signed)
 Please initiate a PA for Voquezna  10 mg tablet.

## 2024-01-11 ENCOUNTER — Telehealth: Payer: Self-pay

## 2024-01-11 ENCOUNTER — Ambulatory Visit: Payer: Self-pay | Admitting: Family

## 2024-01-11 ENCOUNTER — Other Ambulatory Visit (HOSPITAL_COMMUNITY): Payer: Self-pay

## 2024-01-11 NOTE — Telephone Encounter (Signed)
 Pharmacy Patient Advocate Encounter  Received notification from [facility] that Prior Authorization for Voquezna  has been APPROVED from 01/01/2024 to 01/10/2025. Ran test claim, Copay is $100.00. This test claim was processed through [facility]- copay amounts may vary at other pharmacies due to pharmacy/plan contracts, or as the patient moves through the different stages of their insurance plan.

## 2024-01-11 NOTE — Telephone Encounter (Signed)
*  AA  Pharmacy Patient Advocate Encounter   Received notification from Pt Calls Messages that prior authorization for Voquezna  10MG  tablets  is required/requested.   Insurance verification completed.   The patient is insured through [employer] .   Per test claim: PA required; PA submitted to above mentioned insurance via CoverMyMeds Key/confirmation #/EOC [id number] Status is pending

## 2024-01-11 NOTE — Telephone Encounter (Signed)
 PA request has been Received. New Encounter has been or will be created for follow up. For additional info see Pharmacy Prior Auth telephone encounter from 07/11.

## 2024-01-11 NOTE — Progress Notes (Signed)
 It looks like Dr. Iva has already resulted this on a telephone encounter while I was out of the office. Thank you

## 2024-01-14 NOTE — Telephone Encounter (Signed)
 Will need all of these for any asthma diagnosis

## 2024-01-16 NOTE — Telephone Encounter (Signed)
 OK - did you call the patient to let her know?   Marty Shaggy, MD [facility] 

## 2024-01-17 ENCOUNTER — Ambulatory Visit: Admitting: Student in an Organized Health Care Education/Training Program

## 2024-01-18 NOTE — Telephone Encounter (Signed)
 Telephone call to patient to explain the outcome of request

## 2024-01-18 NOTE — Telephone Encounter (Signed)
 Thanks for letting her know, Tam Tam!   Marty Shaggy, MD [facility] 

## 2024-01-18 NOTE — Telephone Encounter (Signed)
 Called patient and advised requirements for biologic therapy with new PBM. Instructed her to call if she is having flareups or come in so we can get documentation of uncontrolled severe asthma requiring at least two oral steroids within the year then we can get her to come in for a low effort PFT

## 2024-01-20 ENCOUNTER — Encounter: Payer: Self-pay | Admitting: Allergy & Immunology

## 2024-01-21 MED ORDER — ALBUTEROL SULFATE (2.5 MG/3ML) 0.083% IN NEBU: 2.5000 mg | INHALATION_SOLUTION | Freq: Four times a day (QID) | RESPIRATORY_TRACT | 3 refills | Status: AC | PRN

## 2024-01-30 ENCOUNTER — Telehealth: Admitting: Physician Assistant

## 2024-01-30 DIAGNOSIS — G43909 Migraine, unspecified, not intractable, without status migrainosus: Secondary | ICD-10-CM

## 2024-01-30 MED ORDER — ONDANSETRON 4 MG PO TBDP: 4.0000 mg | ORAL_TABLET | Freq: Three times a day (TID) | ORAL | 0 refills | Status: DC | PRN

## 2024-01-30 MED ORDER — SUMATRIPTAN SUCCINATE 50 MG PO TABS: 50.0000 mg | ORAL_TABLET | Freq: Once | ORAL | 0 refills | Status: DC

## 2024-01-30 NOTE — Progress Notes (Signed)
 Virtual Visit Consent   Nicole Rodriguez, you are scheduled for a virtual visit with a [facility] provider today. Just as with appointments in the office, your consent must be obtained to participate. Your consent will be active for this visit and any virtual visit you may have with one of our providers in the next 365 days. If you have a MyChart account, a copy of this consent can be sent to you electronically.  As this is a virtual visit, video technology does not allow for your provider to perform a traditional examination. This may limit your provider's ability to fully assess your condition. If your provider identifies any concerns that need to be evaluated in person or the need to arrange testing (such as labs, EKG, etc.), we will make arrangements to do so. Although advances in technology are sophisticated, we cannot ensure that it will always work on either your end or our end. If the connection with a video visit is poor, the visit may have to be switched to a telephone visit. With either a video or telephone visit, we are not always able to ensure that we have a secure connection.  By engaging in this virtual visit, you consent to the provision of healthcare and authorize for your insurance to be billed (if applicable) for the services provided during this visit. Depending on your insurance coverage, you may receive a charge related to this service.  I need to obtain your verbal consent now. Are you willing to proceed with your visit today? Shadasia Oldfield has provided verbal consent on 01/30/2024 for a virtual visit (video or telephone). Nicole Rodriguez, [place]  Date: 01/30/2024 7:50 AM   Virtual Visit via Video Note   I, Nicole Rodriguez, connected with  Nicole Rodriguez  ([id number], October 24, 1990) on 01/30/24 at  7:45 AM EDT by a video-enabled telemedicine application and verified that I am speaking with the correct person using two identifiers.  Location: Patient: Virtual Visit  Location Patient: Home Provider: Virtual Visit Location Provider: Home Office   I discussed the limitations of evaluation and management by telemedicine and the availability of in person appointments. The patient expressed understanding and agreed to proceed.    History of Present Illness: Nicole Rodriguez is a 33 y.o. who identifies as a female who was assigned female at birth, with history of migraines and is being seen today for acute migraine headache. Notes symptoms started first thing this morning while getting ready for work. Noted frontal and left-sided throbbing headache. Photophobia and nausea without vomiting. Denies any new headache symptoms for her. Has taken some OTC Tylenol  and Aleve but so far without relief. Has follow-up with Neurologist in September. Is out of her Imitrex .   HPI: HPI  Problems:  Patient Active Problem List   Diagnosis Date Noted   Severe persistent asthma, uncomplicated 11/01/2022   Viral URI 07/26/2022   Intermittent palpitations 08/20/2020   Gastric polyp    GAD (generalized anxiety disorder) 07/19/2019   Social anxiety disorder 07/19/2019   Panic disorder 07/19/2019   Bipolar 2 disorder (HCC) 07/19/2019   Other seasonal allergic rhinitis 08/19/2018   GERD (gastroesophageal reflux disease) 08/19/2018   Asthma 11/29/2017   Breast mass, right 03/18/2014   Numbness and tingling in both hands 01/26/2014    Allergies:  Allergies  Allergen Reactions   Amoxicillin Hives   Asa [Aspirin] Hives   Cefoxitin Itching and Rash   Sulfa Antibiotics Hives   Medications:  Current Outpatient Medications:  AIRSUPRA  90-80 MCG/ACT AERO, Inhale 2 puffs into the lungs as needed (every four to six hours for cough, wheeze, shortness of breath. Rinse mouth)., Disp: 10.7 g, Rfl: 1   albuterol  (PROVENTIL ) (2.5 MG/3ML) 0.083% nebulizer solution, Take 3 mLs (2.5 mg total) by nebulization every 6 (six) hours as needed for wheezing or shortness of breath., Disp: 75 mL,  Rfl: 3   albuterol  (VENTOLIN  HFA) 108 (90 Base) MCG/ACT inhaler, Inhale 2 puffs into the lungs every 6 (six) hours as needed for wheezing or shortness of breath., Disp: 8 g, Rfl: 1   Azelastine  HCl 137 MCG/SPRAY SOLN, Place 2 sprays in each nostril twice a day as needed for runny nose/drainage down throat, Disp: 30 mL, Rfl: 3   buPROPion  (WELLBUTRIN  SR) 200 MG 12 hr tablet, Take 1 tablet (200 mg total) by mouth 2 (two) times daily., Disp: 270 tablet, Rfl: 1   clonazePAM  (KLONOPIN ) 1 MG tablet, 2 qhs, Disp: 180 tablet, Rfl: 1   fluticasone  (FLONASE ) 50 MCG/ACT nasal spray, SPRAY 2 SPRAYS INTO EACH NOSTRIL EVERY DAY AS NEEDED FOR STUFFY NOSE, Disp: 16 g, Rfl: 5   fluticasone -salmeterol (ADVAIR DISKUS) 250-50 MCG/ACT AEPB, Inhale 1 puff twice a day to help prevent cough and wheeze.  Rinse mouth out afterwards, Disp: 1 each, Rfl: 5   levocetirizine (XYZAL ) 5 MG tablet, Take one tablet once a day as needed for runny nose, Disp: 30 tablet, Rfl: 5   montelukast  (SINGULAIR ) 10 MG tablet, Take 1 tablet (10 mg total) by mouth at bedtime., Disp: 30 tablet, Rfl: 5   ondansetron  (ZOFRAN -ODT) 4 MG disintegrating tablet, Take 1 tablet (4 mg total) by mouth every 8 (eight) hours as needed for nausea or vomiting., Disp: 20 tablet, Rfl: 0   pantoprazole  (PROTONIX ) 40 MG tablet, Take 1 tablet (40 mg total) by mouth daily., Disp: 90 tablet, Rfl: 1   SUMAtriptan  (IMITREX ) 50 MG tablet, Take 1 tablet (50 mg total) by mouth once for 1 dose. May repeat in 2 hours if headache persists or recurs., Disp: 10 tablet, Rfl: 0   Vonoprazan Fumarate  (VOQUEZNA ) 10 MG TABS, Take 1 tablet by mouth daily., Disp: 30 tablet, Rfl: 5  Observations/Objective: Patient is well-developed, well-nourished in no acute distress.  Resting comfortably  at home.  Head is normocephalic, atraumatic.  No labored breathing.  Speech is clear and coherent with logical content.  Patient is alert and oriented at baseline.   Assessment and Plan: 1.  Acute migraine (Primary) - SUMAtriptan  (IMITREX ) 50 MG tablet; Take 1 tablet (50 mg total) by mouth once for 1 dose. May repeat in 2 hours if headache persists or recurs.  Dispense: 10 tablet; Refill: 0 - ondansetron  (ZOFRAN -ODT) 4 MG disintegrating tablet; Take 1 tablet (4 mg total) by mouth every 8 (eight) hours as needed for nausea or vomiting.  Dispense: 20 tablet; Refill: 0  No alarm signs or symptoms. No new headache symptoms per patient.  Supportive measures and OTC medications reviewed. Sumatriptan  refilled to use as directed. Zofran  per orders. Follow-up in person for any non-resolving, new or worsening symptoms despite treatment.  Follow-up with Neurologist as scheduled.   Follow Up Instructions: I discussed the assessment and treatment plan with the patient. The patient was provided an opportunity to ask questions and all were answered. The patient agreed with the plan and demonstrated an understanding of the instructions.  A copy of instructions were sent to the patient via MyChart unless otherwise noted below.   The patient was advised to  call back or seek an in-person evaluation if the symptoms worsen or if the condition fails to improve as anticipated.    Nicole Velma Lunger, PA-C

## 2024-01-30 NOTE — Patient Instructions (Signed)
 Nicole Rodriguez, thank you for joining Elsie Velma Lunger, PA-C for today's virtual visit.  While this provider is not your primary care provider (PCP), if your PCP is located in our provider database this encounter information will be shared with them immediately following your visit.   A [facility] MyChart account gives you access to today's visit and all your visits, tests, and labs performed at [facility]  click here if you don't have a [facility] MyChart account or go to [email or web address]  Consent: (Patient) Nicole Rodriguez provided verbal consent for this virtual visit at the beginning of the encounter.  Current Medications:  Current Outpatient Medications:    AIRSUPRA  90-80 MCG/ACT AERO, Inhale 2 puffs into the lungs as needed (every four to six hours for cough, wheeze, shortness of breath. Rinse mouth)., Disp: 10.7 g, Rfl: 1   albuterol  (PROVENTIL ) (2.5 MG/3ML) 0.083% nebulizer solution, Take 3 mLs (2.5 mg total) by nebulization every 6 (six) hours as needed for wheezing or shortness of breath., Disp: 75 mL, Rfl: 3   albuterol  (VENTOLIN  HFA) 108 (90 Base) MCG/ACT inhaler, Inhale 2 puffs into the lungs every 6 (six) hours as needed for wheezing or shortness of breath., Disp: 8 g, Rfl: 1   Azelastine  HCl 137 MCG/SPRAY SOLN, Place 2 sprays in each nostril twice a day as needed for runny nose/drainage down throat, Disp: 30 mL, Rfl: 3   buPROPion  (WELLBUTRIN  SR) 200 MG 12 hr tablet, Take 1 tablet (200 mg total) by mouth 2 (two) times daily., Disp: 270 tablet, Rfl: 1   clonazePAM  (KLONOPIN ) 1 MG tablet, 2 qhs, Disp: 180 tablet, Rfl: 1   fluticasone  (FLONASE ) 50 MCG/ACT nasal spray, SPRAY 2 SPRAYS INTO EACH NOSTRIL EVERY DAY AS NEEDED FOR STUFFY NOSE, Disp: 16 g, Rfl: 5   fluticasone -salmeterol (ADVAIR DISKUS) 250-50 MCG/ACT AEPB, Inhale 1 puff twice a day to help prevent cough and wheeze.  Rinse mouth out afterwards, Disp: 1 each, Rfl: 5   levocetirizine (XYZAL ) 5 MG  tablet, Take one tablet once a day as needed for runny nose, Disp: 30 tablet, Rfl: 5   montelukast  (SINGULAIR ) 10 MG tablet, Take 1 tablet (10 mg total) by mouth at bedtime., Disp: 30 tablet, Rfl: 5   ondansetron  (ZOFRAN -ODT) 4 MG disintegrating tablet, Take 1 tablet (4 mg total) by mouth every 8 (eight) hours as needed for nausea or vomiting., Disp: 20 tablet, Rfl: 0   pantoprazole  (PROTONIX ) 40 MG tablet, Take 1 tablet (40 mg total) by mouth daily., Disp: 90 tablet, Rfl: 1   SUMAtriptan  (IMITREX ) 50 MG tablet, Take 1 tablet (50 mg total) by mouth once for 1 dose. May repeat in 2 hours if headache persists or recurs., Disp: 10 tablet, Rfl: 0   Vonoprazan Fumarate  (VOQUEZNA ) 10 MG TABS, Take 1 tablet by mouth daily., Disp: 30 tablet, Rfl: 5   Medications ordered in this encounter:  Meds ordered this encounter  Medications   SUMAtriptan  (IMITREX ) 50 MG tablet    Sig: Take 1 tablet (50 mg total) by mouth once for 1 dose. May repeat in 2 hours if headache persists or recurs.    Dispense:  10 tablet    Refill:  0    Supervising Provider:   LAMPTEY, PHILIP O [[id number]]   ondansetron  (ZOFRAN -ODT) 4 MG disintegrating tablet    Sig: Take 1 tablet (4 mg total) by mouth every 8 (eight) hours as needed for nausea or vomiting.    Dispense:  20 tablet    Refill:  0    Supervising Provider:   BLAISE ALEENE KIDD [[id number]]     *If you need refills on other medications prior to your next appointment, please contact your pharmacy*  Follow-Up: Call back or seek an in-person evaluation if the symptoms worsen or if the condition fails to improve as anticipated.  [facility] [phone]  Other Instructions Hydrate and rest. Take the Sumatriptan  (Imitrex ) as directed. The Zofran  is for nausea and to prevent vomiting.  If you note any non-resolving, new, or worsening symptoms despite treatment, please seek an in-person evaluation ASAP. Follow-up as scheduled with your Neurologist.     Migraine Headache A migraine headache is a very strong throbbing pain on one or both sides of your head. This type of headache can also cause other symptoms. It can last from 4 hours to 3 days. Talk with your doctor about what things may bring on (trigger) this condition. What are the causes? The exact cause of a migraine is not known. This condition may be brought on or caused by: Smoking. Medicines, such as: Medicine used to treat chest pain (nitroglycerin). Birth control pills. Estrogen. Some blood pressure medicines. Certain substances in some foods or drinks. Foods and drinks, such as: Cheese. Chocolate. Alcohol. Caffeine. Doing physical activity that is very hard. Other things that may trigger a migraine headache include: Periods. Pregnancy. Hunger. Stress. Getting too much or too little sleep. Weather changes. Feeling tired (fatigue). What increases the risk? Being 49-57 years old. Being female. Having a family history of migraine headaches. Being Caucasian. Having a mental health condition, such as being sad (depressed) or feeling worried or nervous (anxious). Being very overweight (obese). What are the signs or symptoms? A throbbing pain. This pain may: Happen in any area of the head, such as on one or both sides. Make it hard to do daily activities. Get worse with physical activity. Get worse around bright lights, loud noises, or smells. Other symptoms may include: Feeling like you may vomit (nauseous). Vomiting. Dizziness. Before a migraine headache starts, you may get warning signs (an aura). An aura may include: Seeing flashing lights or having blind spots. Seeing bright spots, halos, or zigzag lines. Having tunnel vision or blurred vision. Having numbness or a tingling feeling. Having trouble talking. Having weak muscles. After a migraine ends, you may have symptoms. These may include: Tiredness. Trouble thinking (concentrating). How is this  treated? Taking medicines that: Relieve pain. Relieve the feeling like you may vomit. Prevent migraine headaches. Treatment may also include: Acupuncture. Lifestyle changes like avoiding foods that bring on migraine headaches. Learning ways to control your body functions (biofeedback). Therapy to help you know and deal with negative thoughts (cognitive behavioral therapy). Follow these instructions at home: Medicines Take over-the-counter and prescription medicines only as told by your doctor. If told, take steps to prevent problems with pooping (constipation). You may need to: Drink enough fluid to keep your pee (urine) pale yellow. Take medicines. You will be told what medicines to take. Eat foods that are high in fiber. These include beans, whole grains, and fresh fruits and vegetables. Limit foods that are high in fat and sugar. These include fried or sweet foods. Ask your doctor if you should avoid driving or using machines while you are taking your medicine. Lifestyle  Do not drink alcohol. Do not smoke or use any products that contain nicotine or tobacco. If you need help quitting, ask your doctor. Get 7-9 hours of sleep each night, or  the amount recommended by your doctor. Find ways to deal with stress, such as meditation, deep breathing, or yoga. Try to exercise often. This can help lessen how bad and how often your migraines happen. General instructions Keep a journal to find out what may bring on your migraine headaches. This can help you avoid those things. For example, write down: What you eat and drink. How much sleep you get. Any change to your medicines or diet. If you have a migraine headache: Avoid things that make your symptoms worse, such as bright lights. Lie down in a dark, quiet room. Do not drive or use machinery. Ask your doctor what activities are safe for you. Where to find more information Coalition for Headache and Migraine Patients (CHAMP):  headachemigraine.org American Migraine Foundation: americanmigrainefoundation.org National Headache Foundation: headaches.org Contact a doctor if: You get a migraine headache that is different or worse than others you have had. You have more than 15 days of headaches in one month. Get help right away if: Your migraine headache gets very bad. Your migraine headache lasts more than 72 hours. You have a fever or stiff neck. You have trouble seeing. Your muscles feel weak or like you cannot control them. You lose your balance a lot. You have trouble walking. You faint. You have a seizure. This information is not intended to replace advice given to you by your health care provider. Make sure you discuss any questions you have with your health care provider. Document Revised: 02/13/2022 Document Reviewed: 02/13/2022 Elsevier Patient Education  2024 Elsevier Inc.   If you have been instructed to have an in-person evaluation today at a local Urgent Care facility, please use the link below. It will take you to a list of all of our available [facility], including address, phone number and hours of operation. Please do not delay care.  [facility]  If you or a family member do not have a primary care provider, use the link below to schedule a visit and establish care. When you choose a [facility] primary care physician or advanced practice provider, you gain a long-term partner in health. Find a Primary Care Provider  Learn more about [facility]'s in-office and virtual care options: [facility] Get Care Now

## 2024-02-06 ENCOUNTER — Encounter: Payer: Self-pay | Admitting: Allergy & Immunology

## 2024-02-15 NOTE — Pre-Procedure Instructions (Signed)
 Surgical Instructions   Your procedure is scheduled on February 21, 2024. Report to [facility] Main Entrance A at 5:30 A.M., then check in with the Admitting office. Any questions or running late day of surgery: call [phone]  Questions prior to your surgery date: call [phone], Monday-Friday, 8am-4pm. If you experience any cold or flu symptoms such as cough, fever, chills, shortness of breath, etc. between now and your scheduled surgery, please notify us  at the above number.     Remember:  Do not eat after midnight the night before your surgery  You may drink clear liquids until 4:30 AM the morning of your surgery.   Clear liquids allowed are: Water , Non-Citrus Juices (without pulp), Carbonated Beverages, Clear Tea (no milk, honey, etc.), Black Coffee Only (NO MILK, CREAM OR POWDERED CREAMER of any kind), and Gatorade.    Take these medicines the morning of surgery with A SIP OF WATER : buPROPion  (WELLBUTRIN  SR)  clonazePAM  (KLONOPIN )  fluticasone  (FLONASE ) nasal spray  fluticasone -salmeterol (ADVAIR DISKUS)  gabapentin (NEURONTIN)  Vonoprazan Fumarate  (VOQUEZNA )    May take these medicines IF NEEDED: AIRSUPRA  AERO inhaler - please bring inhaler with you morning of surgery albuterol  (PROVENTIL ) nebulizer solution  Azelastine  HCl nasal spray ondansetron  (ZOFRAN -ODT)  oxyCODONE  (OXY IR/ROXICODONE )  SUMAtriptan  (IMITREX )    One week prior to surgery, STOP taking any Aspirin (unless otherwise instructed by your surgeon) Aleve, Naproxen, Ibuprofen , Motrin , Advil , Goody's, BC's, all herbal medications, fish oil, and non-prescription vitamins.                     Do NOT Smoke (Tobacco/Vaping) for 24 hours prior to your procedure.  If you use a CPAP at night, you may bring your mask/headgear for your overnight stay.   You will be asked to remove any contacts, glasses, piercing's, hearing aid's, dentures/partials prior to surgery. Please bring cases for these items if  needed.    Patients discharged the day of surgery will not be allowed to drive home, and someone needs to stay with them for 24 hours.  SURGICAL WAITING ROOM VISITATION Patients may have no more than 2 support people in the waiting area - these visitors may rotate.   Pre-op nurse will coordinate an appropriate time for 1 ADULT support person, who may not rotate, to accompany patient in pre-op.  Children under the age of 92 must have an adult with them who is not the patient and must remain in the main waiting area with an adult.  If the patient needs to stay at the hospital during part of their recovery, the visitor guidelines for inpatient rooms apply.  Please refer to the [facility] website for the visitor guidelines for any additional information.   If you received a COVID test during your pre-op visit  it is requested that you wear a mask when out in public, stay away from anyone that may not be feeling well and notify your surgeon if you develop symptoms. If you have been in contact with anyone that has tested positive in the last 10 days please notify you surgeon.      Pre-operative 5 CHG Bathing Instructions   You can play a key role in reducing the risk of infection after surgery. Your skin needs to be as free of germs as possible. You can reduce the number of germs on your skin by washing with CHG (chlorhexidine  gluconate) soap before surgery. CHG is an antiseptic soap that kills germs and continues to kill germs even  after washing.   DO NOT use if you have an allergy  to chlorhexidine /CHG or antibacterial soaps. If your skin becomes reddened or irritated, stop using the CHG and notify one of our RNs at [phone].   Please shower with the CHG soap starting 4 days before surgery using the following schedule:     Please keep in mind the following:  DO NOT shave, including legs and underarms, starting the day of your first shower.   You may shave your face at any point  before/day of surgery.  Place clean sheets on your bed the day you start using CHG soap. Use a clean washcloth (not used since being washed) for each shower. DO NOT sleep with pets once you start using the CHG.   CHG Shower Instructions:  Wash your face and private area with normal soap. If you choose to wash your hair, wash first with your normal shampoo.  After you use shampoo/soap, rinse your hair and body thoroughly to remove shampoo/soap residue.  Turn the water  OFF and apply about 3 tablespoons (45 ml) of CHG soap to a CLEAN washcloth.  Apply CHG soap ONLY FROM YOUR NECK DOWN TO YOUR TOES (washing for 3-5 minutes)  DO NOT use CHG soap on face, private areas, open wounds, or sores.  Pay special attention to the area where your surgery is being performed.  If you are having back surgery, having someone wash your back for you may be helpful. Wait 2 minutes after CHG soap is applied, then you may rinse off the CHG soap.  Pat dry with a clean towel  Put on clean clothes/pajamas   If you choose to wear lotion, please use ONLY the CHG-compatible lotions that are listed below.  Additional instructions for the day of surgery: DO NOT APPLY any lotions, deodorants, cologne, or perfumes.   Do not bring valuables to the hospital. [facility] is not responsible for any belongings/valuables. Do not wear nail polish, gel polish, artificial nails, or any other type of covering on natural nails (fingers and toes) Do not wear jewelry or makeup Put on clean/comfortable clothes.  Please brush your teeth.  Ask your nurse before applying any prescription medications to the skin.     CHG Compatible Lotions   Aveeno Moisturizing lotion  Cetaphil Moisturizing Cream  Cetaphil Moisturizing Lotion  Clairol Herbal Essence Moisturizing Lotion, Dry Skin  Clairol Herbal Essence Moisturizing Lotion, Extra Dry Skin  Clairol Herbal Essence Moisturizing Lotion, Normal Skin  Curel Age Defying Therapeutic  Moisturizing Lotion with Alpha Hydroxy  Curel Extreme Care Body Lotion  Curel Soothing Hands Moisturizing Hand Lotion  Curel Therapeutic Moisturizing Cream, Fragrance-Free  Curel Therapeutic Moisturizing Lotion, Fragrance-Free  Curel Therapeutic Moisturizing Lotion, Original Formula  Eucerin Daily Replenishing Lotion  Eucerin Dry Skin Therapy Plus Alpha Hydroxy Crme  Eucerin Dry Skin Therapy Plus Alpha Hydroxy Lotion  Eucerin Original Crme  Eucerin Original Lotion  Eucerin Plus Crme Eucerin Plus Lotion  Eucerin TriLipid Replenishing Lotion  Keri Anti-Bacterial Hand Lotion  Keri Deep Conditioning Original Lotion Dry Skin Formula Softly Scented  Keri Deep Conditioning Original Lotion, Fragrance Free Sensitive Skin Formula  Keri Lotion Fast Absorbing Fragrance Free Sensitive Skin Formula  Keri Lotion Fast Absorbing Softly Scented Dry Skin Formula  Keri Original Lotion  Keri Skin Renewal Lotion Keri Silky Smooth Lotion  Keri Silky Smooth Sensitive Skin Lotion  Nivea Body Creamy Conditioning Oil  Nivea Body Extra Enriched Regulatory affairs officer  Nivea Crme  Nivea Skin Firming Lotion  NutraDerm 30 Skin Lotion  NutraDerm Skin Lotion  NutraDerm Therapeutic Skin Cream  NutraDerm Therapeutic Skin Lotion  ProShield Protective Hand Cream  Provon moisturizing lotion  Please read over the following fact sheets that you were given.

## 2024-02-17 ENCOUNTER — Ambulatory Visit (HOSPITAL_COMMUNITY): Payer: Self-pay | Admitting: Physician Assistant

## 2024-02-18 ENCOUNTER — Other Ambulatory Visit: Payer: Self-pay

## 2024-02-18 ENCOUNTER — Encounter (HOSPITAL_COMMUNITY)
Admission: RE | Admit: 2024-02-18 | Discharge: 2024-02-18 | Disposition: A | Discharge status: Home / Self Care | Source: Ambulatory Visit | Attending: Orthopedic Surgery | Admitting: Orthopedic Surgery

## 2024-02-18 ENCOUNTER — Encounter (HOSPITAL_COMMUNITY): Payer: Self-pay

## 2024-02-18 VITALS — BP 117/85 | HR 87 | Temp 98.3°F | Resp 16 | Ht 62.0 in | Wt 181.8 lb

## 2024-02-18 DIAGNOSIS — Z01812 Encounter for preprocedural laboratory examination: Secondary | ICD-10-CM | POA: Insufficient documentation

## 2024-02-18 DIAGNOSIS — Z01818 Encounter for other preprocedural examination: Secondary | ICD-10-CM

## 2024-02-18 LAB — CBC
HCT: 42.2 % (ref 36.0–46.0)
Hemoglobin: 14.1 g/dL (ref 12.0–15.0)
MCH: 29.4 pg (ref 26.0–34.0)
MCHC: 33.4 g/dL (ref 30.0–36.0)
MCV: 88.1 fL (ref 80.0–100.0)
Platelets: 371 K/uL (ref 150–400)
RBC: 4.79 MIL/uL (ref 3.87–5.11)
RDW: 12.2 % (ref 11.5–15.5)
WBC: 11.5 K/uL — ABNORMAL HIGH (ref 4.0–10.5)
nRBC: 0 % (ref 0.0–0.2)

## 2024-02-18 LAB — BASIC METABOLIC PANEL WITH GFR
Anion gap: 12 (ref 5–15)
BUN: 7 mg/dL (ref 6–20)
CO2: 24 mmol/L (ref 22–32)
Calcium: 9.6 mg/dL (ref 8.9–10.3)
Chloride: 100 mmol/L (ref 98–111)
Creatinine, Ser: 0.7 mg/dL (ref 0.44–1.00)
GFR, Estimated: >60 mL/min (ref >60)
Glucose, Bld: 92 mg/dL (ref 70–99)
Potassium: 3.7 mmol/L (ref 3.5–5.1)
Sodium: 136 mmol/L (ref 135–145)

## 2024-02-18 LAB — SURGICAL PCR SCREEN
MRSA, PCR: NEGATIVE
Staphylococcus aureus: NEGATIVE

## 2024-02-18 NOTE — Progress Notes (Signed)
 PCP - Wanda Craze, FNP Cardiologist - Denies  PPM/ICD - Denies Device Orders - n/a Rep Notified - n/a  Chest x-ray - N/A EKG - N/A Stress Test - Denies ECHO - Denies Cardiac Cath - Denies   Sleep Study - Denies CPAP - n/a  Fasting Blood Sugar - Denies Checks Blood Sugar _____ times a day: N/A  Last dose of GLP1 agonist-  Denies GLP1 instructions: n/a  Blood Thinner Instructions: Denies Aspirin Instructions: Denies  ERAS Protcol - Clears until 4:30 PRE-SURGERY Ensure or G2- none  COVID TEST- n/a   Anesthesia review: No  Patient denies shortness of breath, fever, cough and chest pain at PAT appointment. Patient denies any respiratory issues at this time.    All instructions explained to the patient, with a verbal understanding of the material. Patient agrees to go over the instructions while at home for a better understanding. Patient also instructed to self quarantine after being tested for COVID-19. The opportunity to ask questions was provided.

## 2024-02-20 ENCOUNTER — Encounter (HOSPITAL_COMMUNITY): Payer: Self-pay | Admitting: Orthopedic Surgery

## 2024-02-20 NOTE — Anesthesia Preprocedure Evaluation (Signed)
 Anesthesia Evaluation  Patient identified by MRN, date of birth, ID band Patient awake    Reviewed: Allergy  & Precautions, NPO status , Patient's Chart, lab work & pertinent test results  Airway Mallampati: II  TM Distance: >3 FB     Dental no notable dental hx. 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   Abdominal  (+) + obese  Peds  Hematology negative hematology ROS (+)   Anesthesia Other Findings   Reproductive/Obstetrics                              Anesthesia Physical Anesthesia Plan  ASA: 2  Anesthesia Plan: General   Post-op Pain Management: Dilaudid  IV, Precedex , Ofirmev  IV (intra-op)*, Ketamine  IV* and Minimal or no pain anticipated   Induction: Intravenous and Cricoid pressure planned  PONV Risk Score and Plan: 4 or greater and Treatment may vary due to age or medical condition, Midazolam , Scopolamine  patch - Pre-op, Ondansetron  and Dexamethasone   Airway Management Planned: Oral ETT and Video Laryngoscope Planned  Additional Equipment: None  Intra-op Plan:   Post-operative Plan: Extubation in OR  Informed Consent: I have reviewed the patients History and Physical, chart, labs and discussed the procedure including the risks, benefits and alternatives for the proposed anesthesia with the patient or authorized representative who has indicated his/her understanding and acceptance.     Dental advisory given  Plan Discussed with: CRNA and  Anesthesiologist  Anesthesia Plan Comments:          Anesthesia Quick Evaluation

## 2024-02-21 ENCOUNTER — Ambulatory Visit (HOSPITAL_COMMUNITY): Admission: RE | Admit: 2024-02-21 | Source: Home / Self Care | Admitting: Orthopedic Surgery

## 2024-02-21 ENCOUNTER — Encounter (HOSPITAL_COMMUNITY): Admission: RE | Payer: Self-pay | Source: Home / Self Care

## 2024-02-21 ENCOUNTER — Ambulatory Visit (HOSPITAL_BASED_OUTPATIENT_CLINIC_OR_DEPARTMENT_OTHER)

## 2024-02-21 ENCOUNTER — Other Ambulatory Visit: Payer: Self-pay

## 2024-02-21 ENCOUNTER — Encounter (HOSPITAL_COMMUNITY)
Admission: RE | Disposition: A | Discharge status: Home / Self Care | Payer: Self-pay | Source: Home / Self Care | Attending: Orthopedic Surgery

## 2024-02-21 ENCOUNTER — Encounter (HOSPITAL_COMMUNITY)

## 2024-02-21 ENCOUNTER — Ambulatory Visit (HOSPITAL_COMMUNITY)
Admission: RE | Admit: 2024-02-21 | Discharge: 2024-02-21 | Disposition: A | Discharge status: Home / Self Care | Attending: Orthopedic Surgery | Admitting: Orthopedic Surgery

## 2024-02-21 ENCOUNTER — Encounter (HOSPITAL_COMMUNITY): Payer: Self-pay | Admitting: Orthopedic Surgery

## 2024-02-21 ENCOUNTER — Ambulatory Visit (HOSPITAL_COMMUNITY)

## 2024-02-21 DIAGNOSIS — J45909 Unspecified asthma, uncomplicated: Secondary | ICD-10-CM | POA: Diagnosis not present

## 2024-02-21 DIAGNOSIS — Z7951 Long term (current) use of inhaled steroids: Secondary | ICD-10-CM | POA: Diagnosis not present

## 2024-02-21 DIAGNOSIS — M4722 Other spondylosis with radiculopathy, cervical region: Secondary | ICD-10-CM

## 2024-02-21 DIAGNOSIS — M5030 Other cervical disc degeneration, unspecified cervical region: Secondary | ICD-10-CM | POA: Diagnosis present

## 2024-02-21 DIAGNOSIS — M50322 Other cervical disc degeneration at C5-C6 level: Secondary | ICD-10-CM | POA: Insufficient documentation

## 2024-02-21 DIAGNOSIS — K219 Gastro-esophageal reflux disease without esophagitis: Secondary | ICD-10-CM | POA: Diagnosis not present

## 2024-02-21 DIAGNOSIS — M5412 Radiculopathy, cervical region: Secondary | ICD-10-CM | POA: Diagnosis not present

## 2024-02-21 DIAGNOSIS — M4802 Spinal stenosis, cervical region: Secondary | ICD-10-CM | POA: Diagnosis not present

## 2024-02-21 HISTORY — PX: ANTERIOR CERVICAL DECOMP/DISCECTOMY FUSION: SHX1161

## 2024-02-21 LAB — POCT PREGNANCY, URINE: Preg Test, Ur: NEGATIVE

## 2024-02-21 SURGERY — ANTERIOR CERVICAL DECOMPRESSION/DISCECTOMY FUSION 1 LEVEL
Anesthesia: General

## 2024-02-21 SURGERY — ANTERIOR CERVICAL DECOMPRESSION/DISCECTOMY FUSION 1 LEVEL
Anesthesia: General | Site: Spine Cervical

## 2024-02-21 MED ORDER — FENTANYL CITRATE (PF) 250 MCG/5ML IJ SOLN
INTRAMUSCULAR | Status: DC | PRN
  Administered 2024-02-21: 100 ug via INTRAVENOUS
  Administered 2024-02-21 (×2): 50 ug via INTRAVENOUS
  Administered 2024-02-21: 100 ug via INTRAVENOUS
  Administered 2024-02-21: 50 ug via INTRAVENOUS

## 2024-02-21 MED ORDER — LIDOCAINE 2% (20 MG/ML) 5 ML SYRINGE
INTRAMUSCULAR | Status: AC
  Filled 2024-02-21: qty 5

## 2024-02-21 MED ORDER — HYDROMORPHONE HCL 1 MG/ML IJ SOLN
INTRAMUSCULAR | Status: AC
  Filled 2024-02-21: qty 0.5

## 2024-02-21 MED ORDER — OXYCODONE HCL 5 MG PO TABS
ORAL_TABLET | ORAL | Status: AC
  Filled 2024-02-21: qty 1

## 2024-02-21 MED ORDER — ONDANSETRON HCL 4 MG PO TABS: 4.0000 mg | ORAL_TABLET | Freq: Three times a day (TID) | ORAL | 0 refills | Status: DC | PRN

## 2024-02-21 MED ORDER — ONDANSETRON HCL 4 MG/2ML IJ SOLN
INTRAMUSCULAR | Status: AC
Start: 2024-02-21 — End: 2024-02-21
  Filled 2024-02-21: qty 2

## 2024-02-21 MED ORDER — KETAMINE HCL 10 MG/ML IJ SOLN
INTRAMUSCULAR | Status: DC | PRN
  Administered 2024-02-21: 25 mg via INTRAVENOUS

## 2024-02-21 MED ORDER — PHENYLEPHRINE HCL-NACL 20-0.9 MG/250ML-% IV SOLN
INTRAVENOUS | Status: AC
  Filled 2024-02-21: qty 250

## 2024-02-21 MED ORDER — SCOPOLAMINE 1 MG/3DAYS TD PT72
1.0000 | MEDICATED_PATCH | TRANSDERMAL | Status: DC
  Administered 2024-02-21: 1.5 mg via TRANSDERMAL

## 2024-02-21 MED ORDER — PROPOFOL 10 MG/ML IV BOLUS
INTRAVENOUS | Status: AC
  Filled 2024-02-21: qty 20

## 2024-02-21 MED ORDER — SURGIFLO WITH THROMBIN (HEMOSTATIC MATRIX KIT) OPTIME
TOPICAL | Status: DC | PRN
  Administered 2024-02-21: 1 via TOPICAL

## 2024-02-21 MED ORDER — SCOPOLAMINE 1 MG/3DAYS TD PT72
MEDICATED_PATCH | TRANSDERMAL | Status: AC
  Filled 2024-02-21: qty 1

## 2024-02-21 MED ORDER — METHOCARBAMOL 500 MG PO TABS: 500.0000 mg | ORAL_TABLET | Freq: Three times a day (TID) | ORAL | 0 refills | Status: AC | PRN

## 2024-02-21 MED ORDER — HYDROMORPHONE HCL 1 MG/ML IJ SOLN
0.2500 mg | INTRAMUSCULAR | Status: DC | PRN
  Administered 2024-02-21: .5 mg via INTRAVENOUS

## 2024-02-21 MED ORDER — OXYCODONE HCL 5 MG/5ML PO SOLN: 5.0000 mg | Freq: Once | ORAL | Status: AC | PRN

## 2024-02-21 MED ORDER — PROPOFOL 10 MG/ML IV BOLUS
INTRAVENOUS | Status: DC | PRN
  Administered 2024-02-21: 170 mg via INTRAVENOUS
  Administered 2024-02-21: 30 mg via INTRAVENOUS

## 2024-02-21 MED ORDER — ACETAMINOPHEN 10 MG/ML IV SOLN
INTRAVENOUS | Status: AC
  Filled 2024-02-21: qty 100

## 2024-02-21 MED ORDER — SUGAMMADEX SODIUM 200 MG/2ML IV SOLN
INTRAVENOUS | Status: DC | PRN
  Administered 2024-02-21: 100 mg via INTRAVENOUS
  Administered 2024-02-21 (×2): 50 mg via INTRAVENOUS

## 2024-02-21 MED ORDER — DEXMEDETOMIDINE HCL IN NACL 80 MCG/20ML IV SOLN
INTRAVENOUS | Status: DC | PRN
  Administered 2024-02-21 (×2): 8 ug via INTRAVENOUS

## 2024-02-21 MED ORDER — DEXAMETHASONE SODIUM PHOSPHATE 10 MG/ML IJ SOLN
INTRAMUSCULAR | Status: DC | PRN
  Administered 2024-02-21: 10 mg via INTRAVENOUS

## 2024-02-21 MED ORDER — ACETAMINOPHEN 10 MG/ML IV SOLN
INTRAVENOUS | Status: DC | PRN
  Administered 2024-02-21: 1000 mg via INTRAVENOUS

## 2024-02-21 MED ORDER — THROMBIN 20000 UNITS EX SOLR
CUTANEOUS | Status: AC
  Filled 2024-02-21: qty 20000

## 2024-02-21 MED ORDER — LIDOCAINE 2% (20 MG/ML) 5 ML SYRINGE
INTRAMUSCULAR | Status: DC | PRN
  Administered 2024-02-21: 60 mg via INTRAVENOUS

## 2024-02-21 MED ORDER — OXYCODONE-ACETAMINOPHEN 10-325 MG PO TABS: 1.0000 | ORAL_TABLET | Freq: Four times a day (QID) | ORAL | 0 refills | Status: AC | PRN

## 2024-02-21 MED ORDER — ONDANSETRON HCL 4 MG/2ML IJ SOLN
INTRAMUSCULAR | Status: DC | PRN
  Administered 2024-02-21: 4 mg via INTRAVENOUS

## 2024-02-21 MED ORDER — ONDANSETRON HCL 4 MG/2ML IJ SOLN: 4.0000 mg | Freq: Once | INTRAMUSCULAR | Status: DC | PRN

## 2024-02-21 MED ORDER — CHLORHEXIDINE GLUCONATE 0.12 % MT SOLN
15.0000 mL | Freq: Once | OROMUCOSAL | Status: AC
  Administered 2024-02-21: 15 mL via OROMUCOSAL
  Filled 2024-02-21: qty 15

## 2024-02-21 MED ORDER — VANCOMYCIN HCL IN DEXTROSE 1-5 GM/200ML-% IV SOLN
1000.0000 mg | INTRAVENOUS | Status: AC
  Administered 2024-02-21: 1000 mg via INTRAVENOUS
  Filled 2024-02-21: qty 200

## 2024-02-21 MED ORDER — THROMBIN 20000 UNITS EX SOLR
CUTANEOUS | Status: DC | PRN
  Administered 2024-02-21: 20 mL via TOPICAL

## 2024-02-21 MED ORDER — DEXAMETHASONE SODIUM PHOSPHATE 10 MG/ML IJ SOLN
INTRAMUSCULAR | Status: AC
  Filled 2024-02-21: qty 1

## 2024-02-21 MED ORDER — BUPIVACAINE-EPINEPHRINE 0.25% -1:200000 IJ SOLN
INTRAMUSCULAR | Status: DC | PRN
  Administered 2024-02-21: 9 mL

## 2024-02-21 MED ORDER — AMISULPRIDE (ANTIEMETIC) 5 MG/2ML IV SOLN: 10.0000 mg | Freq: Once | INTRAVENOUS | Status: DC | PRN

## 2024-02-21 MED ORDER — ROCURONIUM BROMIDE 10 MG/ML (PF) SYRINGE
PREFILLED_SYRINGE | INTRAVENOUS | Status: DC | PRN
  Administered 2024-02-21: 20 mg via INTRAVENOUS
  Administered 2024-02-21: 10 mg via INTRAVENOUS
  Administered 2024-02-21: 50 mg via INTRAVENOUS

## 2024-02-21 MED ORDER — MIDAZOLAM HCL 2 MG/2ML IJ SOLN
INTRAMUSCULAR | Status: DC | PRN
  Administered 2024-02-21: 2 mg via INTRAVENOUS

## 2024-02-21 MED ORDER — DEXMEDETOMIDINE HCL IN NACL 80 MCG/20ML IV SOLN
INTRAVENOUS | Status: AC
Start: 2024-02-21 — End: 2024-02-21
  Filled 2024-02-21: qty 20

## 2024-02-21 MED ORDER — BUPIVACAINE-EPINEPHRINE (PF) 0.25% -1:200000 IJ SOLN
INTRAMUSCULAR | Status: AC
  Filled 2024-02-21: qty 30

## 2024-02-21 MED ORDER — TRANEXAMIC ACID-NACL 1000-0.7 MG/100ML-% IV SOLN
1000.0000 mg | INTRAVENOUS | Status: AC
  Administered 2024-02-21: 1000 mg via INTRAVENOUS
  Filled 2024-02-21: qty 100

## 2024-02-21 MED ORDER — HYDROMORPHONE HCL 1 MG/ML IJ SOLN
INTRAMUSCULAR | Status: AC
  Filled 2024-02-21: qty 1

## 2024-02-21 MED ORDER — LACTATED RINGERS IV SOLN: INTRAVENOUS | Status: DC

## 2024-02-21 MED ORDER — KETAMINE HCL 50 MG/5ML IJ SOSY
PREFILLED_SYRINGE | INTRAMUSCULAR | Status: AC
  Filled 2024-02-21: qty 5

## 2024-02-21 MED ORDER — ROCURONIUM BROMIDE 10 MG/ML (PF) SYRINGE
PREFILLED_SYRINGE | INTRAVENOUS | Status: AC
Start: 2024-02-21 — End: 2024-02-21
  Filled 2024-02-21: qty 10

## 2024-02-21 MED ORDER — OXYCODONE HCL 5 MG PO TABS
5.0000 mg | ORAL_TABLET | Freq: Once | ORAL | Status: AC | PRN
  Administered 2024-02-21: 5 mg via ORAL

## 2024-02-21 MED ORDER — ORAL CARE MOUTH RINSE: 15.0000 mL | Freq: Once | OROMUCOSAL | Status: AC

## 2024-02-21 MED ORDER — MIDAZOLAM HCL 2 MG/2ML IJ SOLN
INTRAMUSCULAR | Status: AC
Start: 2024-02-21 — End: 2024-02-21
  Filled 2024-02-21: qty 2

## 2024-02-21 MED ORDER — PHENYLEPHRINE 80 MCG/ML (10ML) SYRINGE FOR IV PUSH (FOR BLOOD PRESSURE SUPPORT)
PREFILLED_SYRINGE | INTRAVENOUS | Status: DC | PRN
  Administered 2024-02-21 (×2): 80 ug via INTRAVENOUS

## 2024-02-21 MED ORDER — 0.9 % SODIUM CHLORIDE (POUR BTL) OPTIME
TOPICAL | Status: DC | PRN
Start: 2024-02-21 — End: 2024-02-21
  Administered 2024-02-21: 1000 mL

## 2024-02-21 MED ORDER — FENTANYL CITRATE (PF) 250 MCG/5ML IJ SOLN
INTRAMUSCULAR | Status: AC
Start: 2024-02-21 — End: 2024-02-21
  Filled 2024-02-21: qty 5

## 2024-02-21 MED ORDER — FENTANYL CITRATE (PF) 250 MCG/5ML IJ SOLN
INTRAMUSCULAR | Status: AC
  Filled 2024-02-21: qty 5

## 2024-02-21 SURGICAL SUPPLY — 53 items
ALLOGRFT BNE OSSIFUSE FBR 1CC (Bone Implant) IMPLANT
BAG COUNTER SPONGE SURGICOUNT (BAG) ×1 IMPLANT
BLADE CLIPPER SURG (BLADE) IMPLANT
CABLE BIPOLOR RESECTION CORD (MISCELLANEOUS) ×1 IMPLANT
CANISTER SUCTION 3000ML PPV (SUCTIONS) ×1 IMPLANT
CLSR STERI-STRIP ANTIMIC 1/2X4 (GAUZE/BANDAGES/DRESSINGS) ×1 IMPLANT
COVER MAYO STAND STRL (DRAPES) ×3 IMPLANT
COVER SURGICAL LIGHT HANDLE (MISCELLANEOUS) ×2 IMPLANT
DEVICE ENDSKLTN IMPLANT SM 7MM (Cage) IMPLANT
DRAPE C-ARM 42X72 X-RAY (DRAPES) ×1 IMPLANT
DRAPE POUCH INSTRU U-SHP 10X18 (DRAPES) ×1 IMPLANT
DRAPE SURG 17X23 STRL (DRAPES) ×1 IMPLANT
DRAPE U-SHAPE 47X51 STRL (DRAPES) ×1 IMPLANT
DRSG OPSITE POSTOP 3X4 (GAUZE/BANDAGES/DRESSINGS) ×1 IMPLANT
DRSG OPSITE POSTOP 4X6 (GAUZE/BANDAGES/DRESSINGS) IMPLANT
DURAPREP 26ML APPLICATOR (WOUND CARE) ×1 IMPLANT
ELECT COATED BLADE 2.86 ST (ELECTRODE) ×1 IMPLANT
ELECT PENCIL ROCKER SW 15FT (MISCELLANEOUS) ×1 IMPLANT
ELECTRODE REM PT RTRN 9FT ADLT (ELECTROSURGICAL) ×1 IMPLANT
GLOVE BIO SURGEON STRL SZ 6.5 (GLOVE) ×1 IMPLANT
GLOVE BIOGEL PI IND STRL 6.5 (GLOVE) ×1 IMPLANT
GLOVE BIOGEL PI IND STRL 8.5 (GLOVE) ×1 IMPLANT
GLOVE SS BIOGEL STRL SZ 8.5 (GLOVE) ×1 IMPLANT
GOWN STRL REUS W/ TWL LRG LVL3 (GOWN DISPOSABLE) ×1 IMPLANT
GOWN STRL REUS W/TWL 2XL LVL3 (GOWN DISPOSABLE) ×1 IMPLANT
KIT BASIN OR (CUSTOM PROCEDURE TRAY) ×1 IMPLANT
KIT TURNOVER KIT B (KITS) ×1 IMPLANT
NDL HYPO 22X1.5 SAFETY MO (MISCELLANEOUS) ×1 IMPLANT
NDL SPNL 18GX3.5 QUINCKE PK (NEEDLE) ×1 IMPLANT
NEEDLE HYPO 22X1.5 SAFETY MO (MISCELLANEOUS) ×1 IMPLANT
NEEDLE SPNL 18GX3.5 QUINCKE PK (NEEDLE) ×1 IMPLANT
NS IRRIG 1000ML POUR BTL (IV SOLUTION) ×1 IMPLANT
PACK ORTHO CERVICAL (CUSTOM PROCEDURE TRAY) ×1 IMPLANT
PACK UNIVERSAL I (CUSTOM PROCEDURE TRAY) ×1 IMPLANT
PAD ARMBOARD POSITIONER FOAM (MISCELLANEOUS) ×2 IMPLANT
PIN DISTRATION 14MM (PIN) IMPLANT
PLATE ACP 1.6 18 (Plate) IMPLANT
POSITIONER HEAD DONUT 9IN (MISCELLANEOUS) ×1 IMPLANT
RESTRAINT LIMB HOLDER UNIV (RESTRAINTS) ×1 IMPLANT
SCREW ACP 3.5X13 S/D VAR ANGLE (Screw) IMPLANT
SPONGE INTESTINAL PEANUT (DISPOSABLE) ×1 IMPLANT
SPONGE SURGIFOAM ABS GEL SZ50 (HEMOSTASIS) ×1 IMPLANT
SURGIFLO W/THROMBIN 8M KIT (HEMOSTASIS) IMPLANT
SUT BONE WAX W31G (SUTURE) ×1 IMPLANT
SUT MNCRL AB 3-0 PS2 27 (SUTURE) ×1 IMPLANT
SUT VIC AB 2-0 CT1 18 (SUTURE) ×1 IMPLANT
SYR BULB IRRIG 60ML STRL (SYRINGE) ×1 IMPLANT
SYR CONTROL 10ML LL (SYRINGE) ×1 IMPLANT
TAPE CLOTH 4X10 WHT NS (GAUZE/BANDAGES/DRESSINGS) ×1 IMPLANT
TAPE UMBILICAL 1/8X30 (MISCELLANEOUS) ×1 IMPLANT
TOWEL GREEN STERILE (TOWEL DISPOSABLE) ×1 IMPLANT
TOWEL GREEN STERILE FF (TOWEL DISPOSABLE) ×1 IMPLANT
WATER STERILE IRR 1000ML POUR (IV SOLUTION) ×1 IMPLANT

## 2024-02-21 NOTE — Brief Op Note (Signed)
 02/21/2024  9:33 AM  PATIENT:  Nicole Rodriguez  34 y.o. female  PRE-OPERATIVE DIAGNOSIS:  degenerative disc C5-6  POST-OPERATIVE DIAGNOSIS:  degenerative disc C5-6  PROCEDURE:  Procedure(s): ANTERIOR CERVICAL DECOMPRESSION/DISCECTOMY FUSION CERVICAL FIVE-CERVICAL SIX (N/A)  SURGEON:  Surgeons and Role:    DEWAINE Burnetta Aures, MD - Primary  PHYSICIAN ASSISTANT: Jeoffrey Sages, PA  ANESTHESIA:   general  EBL:  25 mL   BLOOD ADMINISTERED:none  DRAINS: none   LOCAL MEDICATIONS USED:  MARCAINE      SPECIMEN:  No Specimen  DISPOSITION OF SPECIMEN:  N/A  COUNTS:  YES  TOURNIQUET:  * No tourniquets in log *  DICTATION: .Dragon Dictation  PLAN OF CARE: Discharge to home after PACU  PATIENT DISPOSITION:  PACU - hemodynamically stable.

## 2024-02-21 NOTE — H&P (Signed)
 Chief Complaint: Neck and radicular left arm.  History: Nicole Rodriguez is a very pleasant 33 year old woman whose had chronic neck pain for over a year now. She has had injection therapy and physical therapy in the past but unfortunately her quality of life is continued to deteriorate.  As a result we have elected to move forward with a single level ACDF.  Past Medical History:  Diagnosis Date   Asthma    Bipolar disorder (HCC)    Generalized anxiety disorder    GERD (gastroesophageal reflux disease)    [employer] 08/17/2020   IBS (irritable bowel syndrome)    Plantar fasciitis 10/15/2018   Rectal bleeding 09/24/2019   Social anxiety disorder     Allergies  Allergen Reactions   Amoxicillin Hives   Asa [Aspirin] Hives   Cefoxitin Itching and Rash   Sulfa Antibiotics Hives    No current facility-administered medications on file prior to encounter.   Current Outpatient Medications on File Prior to Encounter  Medication Sig Dispense Refill   albuterol  (PROVENTIL ) (2.5 MG/3ML) 0.083% nebulizer solution Take 3 mLs (2.5 mg total) by nebulization every 6 (six) hours as needed for wheezing or shortness of breath. 75 mL 3   buPROPion  (WELLBUTRIN  SR) 200 MG 12 hr tablet Take 1 tablet (200 mg total) by mouth 2 (two) times daily. 270 tablet 1   clonazePAM  (KLONOPIN ) 1 MG tablet 2 qhs 180 tablet 1   fluticasone  (FLONASE ) 50 MCG/ACT nasal spray SPRAY 2 SPRAYS INTO EACH NOSTRIL EVERY DAY AS NEEDED FOR STUFFY NOSE (Patient taking differently: Place 2 sprays into both nostrils daily.) 16 g 5   fluticasone -salmeterol (ADVAIR DISKUS) 250-50 MCG/ACT AEPB Inhale 1 puff twice a day to help prevent cough and wheeze.  Rinse mouth out afterwards 1 each 5   gabapentin (NEURONTIN) 300 MG capsule Take 300 mg by mouth 3 (three) times daily.     levocetirizine (XYZAL ) 5 MG tablet Take one tablet once a day as needed for runny nose (Patient taking differently: Take 5 mg by mouth every evening.) 30 tablet 5    montelukast  (SINGULAIR ) 10 MG tablet Take 1 tablet (10 mg total) by mouth at bedtime. 30 tablet 5   ondansetron  (ZOFRAN -ODT) 4 MG disintegrating tablet Take 1 tablet (4 mg total) by mouth every 8 (eight) hours as needed for nausea or vomiting. 20 tablet 0   oxyCODONE  (OXY IR/ROXICODONE ) 5 MG immediate release tablet Take 5 mg by mouth 3 (three) times daily as needed for moderate pain (pain score 4-6).     SUMAtriptan  (IMITREX ) 50 MG tablet Take 1 tablet (50 mg total) by mouth once for 1 dose. May repeat in 2 hours if headache persists or recurs. 10 tablet 0   Vonoprazan Fumarate  (VOQUEZNA ) 10 MG TABS Take 1 tablet by mouth daily. 30 tablet 5   AIRSUPRA  90-80 MCG/ACT AERO Inhale 2 puffs into the lungs as needed (every four to six hours for cough, wheeze, shortness of breath. Rinse mouth). 10.7 g 1   albuterol  (VENTOLIN  HFA) 108 (90 Base) MCG/ACT inhaler Inhale 2 puffs into the lungs every 6 (six) hours as needed for wheezing or shortness of breath. (Patient not taking: Reported on 02/13/2024) 8 g 1   Azelastine  HCl 137 MCG/SPRAY SOLN Place 2 sprays in each nostril twice a day as needed for runny nose/drainage down throat 30 mL 3   pantoprazole  (PROTONIX ) 40 MG tablet Take 1 tablet (40 mg total) by mouth daily. (Patient not taking: Reported on 02/13/2024) 90  tablet 1    Physical Exam: Vitals:   02/21/24 0559  BP: 124/86  Pulse: 86  Resp: 18  Temp: 98.8 F (37.1 C)  SpO2: 98%   Body mass index is 33.11 kg/m. Clinical exam: Nicole Rodriguez is a pleasant individual, who appears younger than their stated age.  She is alert and orientated 3.  No shortness of breath, chest pain.  Abdomen is soft and non-tender, negative loss of bowel and bladder control, no rebound tenderness.  Negative: skin lesions abrasions contusions  Peripheral pulses: 2+ peripheral pulses bilaterally. LE compartments are: Soft and nontender.  Gait pattern: Normal  Assistive devices: None  Neuro: Positive numbness and  dysesthesias in the left C6 dermatome. 5/5 motor strength in the upper extremity bilaterally. Positive left Spurling sign with reproduction of C6 radicular pain. Negative Hoffman test, negative Lhermitte sign. Symmetrical 1+ deep tendon reflexes.  Musculoskeletal: Significant neck pain radiating into the left upper extremity. No shoulder, elbow, wrist pain with isolated joint range of motion. Positive cervical crepitus and occipital headaches are noted.  Imaging: X-rays of the cervical spine demonstrate loss of normal cervical lordosis with moderate degenerative disc disease C5-6.  Cervical MRI: completed on 01/30/2024 was reviewed with the patient. It was completed at [facility]; I have independently reviewed the images as well as the radiology report. No cord signal changes. Moderate spinal canal stenosis at C5-6 with moderate left lateral recess and foraminal narrowing causing encroachment on the left C6 nerve root. No disc herniation or stenosis C6-T1. No spinal or foraminal stenosis C3-5. Positive degenerative disc changes C5-6.  A/P: Nicole Rodriguez is a very pleasant 33 year old woman whose had chronic neck pain for over a year now. She has had injection therapy and physical therapy in the past but unfortunately her quality of life is continued to deteriorate. Over the last several weeks she has noticed progressive left upper extremity dysesthesias and neuropathic pain in the C6 dermatome. An updated MRI clearly shows foraminal and lateral recess stenosis at C5-6 which would affect the left C6 nerve root. This would explain her new onset of radicular arm pain. Given the progression in her pain and loss in quality of life she is seeking a surgical solution to her pain. At this point given the loss of normal cervical lordosis I think the best surgical option would be a single level ACDF. I have gone over the surgical procedure in great detail with the patient and her significant other. All of their questions  were addressed.  Risks and benefits of surgery were discussed with the patient. These include: Infection, bleeding, death, stroke, paralysis, ongoing or worse pain, need for additional surgery, nonunion, leak of spinal fluid, adjacent segment degeneration requiring additional fusion surgery. Pseudoarthrosis (nonunion)requiring supplemental posterior fixation. Throat pain, swallowing difficulties, hoarseness or change in voice.

## 2024-02-21 NOTE — Anesthesia Procedure Notes (Cosign Needed)
 Procedure Name: Intubation Date/Time: 02/21/2024 7:34 AM  Performed by: Jerrye Sharper, MDPre-anesthesia Checklist: Patient identified, Emergency Drugs available, Suction available and Patient being monitored Patient Re-evaluated:Patient Re-evaluated prior to induction Oxygen Delivery Method: Circle system utilized Preoxygenation: Pre-oxygenation with 100% oxygen Induction Type: IV induction Ventilation: Mask ventilation without difficulty Laryngoscope Size: Glidescope and 3 Grade View: Grade I Tube type: Oral Number of attempts: 1 Airway Equipment and Method: Stylet and Oral airway Placement Confirmation: ETT inserted through vocal cords under direct vision, positive ETCO2 and breath sounds checked- equal and bilateral Secured at: 22 cm Tube secured with: Tape Dental Injury: Teeth and Oropharynx as per pre-operative assessment

## 2024-02-21 NOTE — Transfer of Care (Signed)
 Immediate Anesthesia Transfer of Care Note  Patient: Nicole Rodriguez  Procedure(s) Performed: ANTERIOR CERVICAL DECOMPRESSION/DISCECTOMY FUSION CERVICAL FIVE-CERVICAL SIX (Spine Cervical)  Patient Location: PACU  Anesthesia Type:General  Level of Consciousness: drowsy and patient cooperative  Airway & Oxygen Therapy: Patient Spontanous Breathing  Post-op Assessment: Report given to RN and Post -op Vital signs reviewed and stable  Post vital signs: Reviewed and stable  Last Vitals:  Vitals Value Taken Time  BP 119/95 02/21/24 09:45  Temp 36.7 C 02/21/24 09:45  Pulse 117 02/21/24 09:49  Resp 19 02/21/24 09:49  SpO2 96 % 02/21/24 09:49  Vitals shown include unfiled device data.  Last Pain:  Vitals:   02/21/24 0605  TempSrc:   PainSc: 9       Patients Stated Pain Goal: 3 (02/21/24 9394)  Complications: No notable events documented.

## 2024-02-21 NOTE — Op Note (Signed)
 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  18 mm NuVasive ACP plate.  13 mm locking screws.  Graft: ossifuse  BRIEF HISTORY: Nicole Rodriguez is a 33 y.o. female who presented to my office with complaints of significant neck and radicular left arm pain.  Attempts at conservative management had failed to improve her quality of life.  As a result we elected to move forward with surgery.  All appropriate risks, benefits, alternatives were discussed and consent was obtained.  PROCEDURE DETAILS: Patient was brought into the operating room and was properly positioned on the operating room table.  After induction with general anesthesia the patient was endotracheally intubated.  A timeout was taken to confirm all important data: including patient, procedure, and the level. Teds, SCD's were applied.   Patient was placed supine on the operating table and the anterior cervical spine was prepped and draped in a standard fashion.  Using fluoroscopy I marked out the C5-6 disc space level and infiltrated my incision site with quarter percent Marcaine  with epinephrine .  Transverse incision was made and sharp dissection was carried out down to and through the platysma.  A standard Smith-Robinson approach was performed to the cervical spine.  Identified the avascular plane along the medial border the sternocleidomastoid and sharply dissected through the deep cervical and prevertebral fascia.  The omohyoid was identified and swept to the right.  The carotid sheath was identified and protected with a  finger laterally.  The esophagus and trachea were then mobilized to the right retracted with a hand-held retractor.  The anterior longitudinal ligament was now visible.  Using Kitner dissectors I exposed the level and then placed the needle into the disc space.  X-ray confirmed that we are at the appropriate level.  Once the level was proved I mobilized the longus coli muscle using bipolar cautery from the mid body of C5 to the mid body of C6.  Caspar retracting blades were placed underneath the longus coli muscle and the endotracheal cuff was deflated.  I expanded the retractor to the appropriate width and reinflated the endotracheal cuff.  Annulotomy was then performed with a 15 blade scalpel and using pituitary rongeurs and curettes I removed the bulk of the disc material.  Distraction pins were placed into the body of C5 and C6 and distracted the intervertebral space with a laminar spreader and maintained with a distraction pin set.  I continue to remove the disc material working back to the posterior annulus with curettes and pituitary rongeurs.  I then used a 1 mm Kerrison to begin removing the posterior osteophyte from the body of C5 and C6.  I then used a fine nerve hook to begin dissecting through the posterior annulus and posterior longitudinal ligament.  2 fragments of disc material were mobilized in the left posterior lateral corner consistent with preoperative MRI.  I then created a plane between the posterior longitudinal ligament and the thecal sac and resected the PLL with a 1 mm Kerrison rongeur.  I then undercut the uncovertebral joints to further decompress the nerve bilaterally.  I could now  easily pass my nerve hook behind the vertebral bodies of C5 and C6 and under the uncovertebral joints bilaterally.  I confirmed satisfactory decompression with fluoroscopy.  I also confirmed that parallel endplate distraction using live fluoroscopy.  Once the decompression/discectomy proved satisfactory I  moved forward with the instrumentation.  Using trial intervertebral spacers I elected to use the 7 small lordotic implant.  The endplates were rasped and I irrigated the wound copiously with normal saline.  Distraction pins were removed and the resulting holes were sealed with bone wax.  The implant was obtained and packed with the allograft and malleted to the appropriate depth.  I gently countersunk it to allow for some additional bone graft to be placed anterior to the cage for sentinel fusion.  The 18 mm plate was then gently contoured and then applied to the vertebral body of C5 and C6.  It was secured with 13 mm locking screws.  All 4 screws had excellent purchase.  Once the screws were appropriately tightened down I engaged the locking device on the plate to prevent backout.  I irrigated the wound copious with normal saline and then obtained hemostasis with bipolar cautery and Floseal.  After final irrigation retractors were removed and the trach and esophagus were returned to midline.  Final imaging demonstrated satisfactory positioning of the implants in both the AP and lateral planes.  The platysma was now closed with interrupted 2-0 Vicryl sutures and the skin with a 3-0 Monocryl.  Steri-Strips dry dressing and an Aspen collar were applied.  The patient was ultimately extubated and transferred to the PACU without incident.  The end of the case all needle sponge counts were correct.  There were no adverse intraoperative events.  Donaciano Sprang, MD 02/21/2024 9:25 AM

## 2024-02-21 NOTE — Progress Notes (Signed)
 Patient ambulated to bathroom. Able to void spontaneously.  Patient tolerated PO intake.  Patient discharged per order.

## 2024-02-21 NOTE — Anesthesia Postprocedure Evaluation (Signed)
 Anesthesia Post Note  Patient: Nicole Rodriguez  Procedure(s) Performed: ANTERIOR CERVICAL DECOMPRESSION/DISCECTOMY FUSION CERVICAL FIVE-CERVICAL SIX (Spine Cervical)     Patient location during evaluation: PACU Anesthesia Type: General Level of consciousness: awake and alert and oriented Pain management: pain level controlled Vital Signs Assessment: post-procedure vital signs reviewed and stable Respiratory status: spontaneous breathing, nonlabored ventilation and respiratory function stable Cardiovascular status: blood pressure returned to baseline and stable Postop Assessment: no apparent nausea or vomiting Anesthetic complications: no   No notable events documented.  Last Vitals:  Vitals:   02/21/24 1000 02/21/24 1015  BP: 114/77 117/75  Pulse: (!) 106 97  Resp: (!) 23 (!) 21  Temp:  36.7 C  SpO2: 93% 93%    Last Pain:  Vitals:   02/21/24 1034  TempSrc:   PainSc: 4                  Ilyaas Musto A.

## 2024-02-21 NOTE — Discharge Instructions (Signed)
 Today you will be discharged from the hospital.  The purpose of the following handout is to help guide you over the next 2 weeks.  First and foremost, be sure you have a follow up appointment with Dr. Shon Baton 2 weeks from the time of your surgery to have your sutures removed.  Please call [facility] [phone] to schedule or confirm this appointment.      Brace You do not have to wear the collar while lying in bed or sitting in a high-backed chair, eating, sleeping or showering.  Other than these instances, you must wear the brace.  You may NOT wear the collar while driving a vehicle (see driving restrictions below).  It is advisable that you wear the collar in public places or while traveling in a car as a passenger.  Dr. Shon Baton will discuss further use of the collar at your 2 week postop visit.  Wound Care You may SHOWER 5 days from the date of surgery.  Shower directly over the steri-strips.  DO NOT scrub or submerge (bath tub, swimming pool, hot tub, etc.) the area.  Pat to dry following your shower.  There is no need for additional dressings other than the steri-strips.  Allow the steri-strips to fall off on their own.  Once the strips have fallen off, you may leave the area undressed.  DO NOT apply lotion/cream/ointment to the area.  The wound must remain dry at all times other than while showering.  Dr. Shon Baton or his staff will remove your stiches at your first postop visit and give you additional instructions regarding wound care at that time.   Activity NO DRIVING FOR 2 WEEKS.  No lifting over 5 pounds (approximately a gallon of milk).  No bending, stooping, squatting or twisting.  No overhead activities.  We encourage you to walk (short distances and often throughout the day) as you can tolerate.  A good rule of thumb is to get up and move once or twice every hour.  You may go up and down stairs carefully.  As you continue to recover, Dr. Shon Baton will address and  adjust restrictions to your activities until no further restrictions are needed.  However, until your first postop visit, when Dr. Shon Baton can assess your recovery, you are to follow these instructions.  At the end of this document is a tentative outline of activities for up to 1 year.       Medication You will be discharged from the hospital with medication for pain, spasm, nausea and constipation.  You will be given enough medication to last until your first postop visit in 2 weeks.  Medications WILL NOT BE REFILLED EARLY; therefore, you are to take the medications only as directed.  If you have been given multiple prescriptions, please leave them with your pharmacy.  They can keep them on file for when you need them.  Medications that are lost or stolen WILL NOT be replaced.  We will address the need for continuing certain medications on an individual basis during your postop visit.  We ask that you avoid over the counter anti-inflammatory medications (Advil, Aleve, Motrin) for 3 months.    What you can expect following neck surgery... It is not uncommon to experience a sore throat or difficulty swallowing following neck surgery.  Cold liquids and soft foods are helpful in soothing this discomfort.  There is no specific diet that you are to follow after surgery, however, there are a  few things you should keep in mind to avoid unneeded discomfort.  Take small bites and eat slowly.  Chew your food thoroughly before swallowing.   It is not uncommon to experience incisional soreness or pain in the back of the neck, shoulders or between the shoulder blades.  These symptoms will slowly begin to resolve as you continue to recover, however, they can last for a few weeks.    It is not uncommon to experience INTERMITTENT arm pain following surgery.  This pain can mimic the arm pain you had prior to surgery.  As long as the pain resolves on its own and is not constant, there is no need to become alarmed.    When To Call If you experience fever >101F, loss of bowel or bladder control, painful swelling in the lower extremities, constant (unresolving) arm pain.  If you experience any of these symptoms, please call [facility] [phone].  What's Next As mentioned earlier, you will follow up with Dr. Shon Baton in 2 weeks.  At that time, we will likely remove your stitches and discuss additional aspects of your recovery.                   ACTIVITY GUIDELINES ANTERIOR CERVICAL DISECTOMY AND FUSION  Activity Discharge 2 weeks 6 weeks 3 months 6 months 1 year  Shower 5 days        Submerge the wound  no no yes     Walking outdoors yes       Lifting 5 lbs yes       Climbing stairs yes       Cooking yes       Car rides (less than 30 minutes) yes       Car rides (greater than 30 minutes) no varies yes     Air travel no varies yes     Short outings [employer], visits, etc...) yes       School no no yes     Driving a car no no varies yes    Light upper extremity exercises no no varies yes    Stationary bike no no yes     Swimming (no diving) no no no varies yes   Vacuuming, laundry, mopping no no no varies yes   Biking outdoors no no no no varies yes  Light jogging no no no varies yes   Low impact aerobics no no no varies yes   Non-contact sports (tennis, golf) no no no varies yes   Hunting (no tree climbing) no no no varies yes   Dancing (non-gymnastics) no no no varies yes   Down-hill skiing (experienced skier) no no no no yes   Down-hill skiing (novice) no no no no yes   Cross-country skiing no no no no yes   Horseback riding (noncompetitive)  no no no no yes   Horseback riding (competitive) no no no no varies yes  Gardening/landscaping no no no varies yes   House repairs no no no varies varies yes  Lifting up to 50 lbs no no no no varies yes

## 2024-02-22 ENCOUNTER — Encounter (HOSPITAL_COMMUNITY): Payer: Self-pay | Admitting: Orthopedic Surgery

## 2024-03-10 ENCOUNTER — Encounter: Payer: Self-pay | Admitting: Neurology

## 2024-03-10 ENCOUNTER — Ambulatory Visit: Payer: Self-pay | Admitting: Neurology

## 2024-03-11 ENCOUNTER — Encounter: Payer: Self-pay | Admitting: Allergy & Immunology

## 2024-03-13 MED ORDER — LINACLOTIDE 72 MCG PO CAPS: 72.0000 ug | ORAL_CAPSULE | Freq: Every day | ORAL | 1 refills | Status: AC

## 2024-03-18 ENCOUNTER — Telehealth (HOSPITAL_COMMUNITY): Payer: Self-pay | Admitting: *Deleted

## 2024-03-18 ENCOUNTER — Ambulatory Visit (HOSPITAL_COMMUNITY): Admitting: Psychiatry

## 2024-03-18 DIAGNOSIS — F3181 Bipolar II disorder: Secondary | ICD-10-CM

## 2024-03-18 MED ORDER — BUPROPION HCL ER (SR) 200 MG PO TB12: 200.0000 mg | ORAL_TABLET | Freq: Two times a day (BID) | ORAL | 1 refills | Status: DC

## 2024-03-18 MED ORDER — CLONAZEPAM 1 MG PO TABS
ORAL_TABLET | ORAL | 1 refills | Status: DC
Start: 2024-03-18 — End: 2024-05-06

## 2024-03-18 NOTE — Telephone Encounter (Signed)
 Pt called requesting bridge refill of the Wellbutrin  200 mg BID and Klonopin  1 mg, 2 tabs at bedtime.

## 2024-03-19 ENCOUNTER — Emergency Department (HOSPITAL_BASED_OUTPATIENT_CLINIC_OR_DEPARTMENT_OTHER)
Admission: EM | Admit: 2024-03-19 | Discharge: 2024-03-19 | Disposition: A | Discharge status: Home / Self Care | Attending: Emergency Medicine | Admitting: Emergency Medicine

## 2024-03-19 ENCOUNTER — Emergency Department (HOSPITAL_BASED_OUTPATIENT_CLINIC_OR_DEPARTMENT_OTHER)

## 2024-03-19 ENCOUNTER — Encounter: Payer: Self-pay | Admitting: Allergy & Immunology

## 2024-03-19 ENCOUNTER — Other Ambulatory Visit: Payer: Self-pay

## 2024-03-19 DIAGNOSIS — R911 Solitary pulmonary nodule: Secondary | ICD-10-CM

## 2024-03-19 DIAGNOSIS — R300 Dysuria: Secondary | ICD-10-CM | POA: Diagnosis not present

## 2024-03-19 DIAGNOSIS — R1031 Right lower quadrant pain: Secondary | ICD-10-CM | POA: Diagnosis present

## 2024-03-19 LAB — CBC WITH DIFFERENTIAL/PLATELET
Abs Immature Granulocytes: 0.02 K/uL (ref 0.00–0.07)
Basophils Absolute: 0 K/uL (ref 0.0–0.1)
Basophils Relative: 0 %
Eosinophils Absolute: 0.2 K/uL (ref 0.0–0.5)
Eosinophils Relative: 2 %
HCT: 38.7 % (ref 36.0–46.0)
Hemoglobin: 13.6 g/dL (ref 12.0–15.0)
Immature Granulocytes: 0 %
Lymphocytes Relative: 35 %
Lymphs Abs: 3.4 K/uL (ref 0.7–4.0)
MCH: 30 pg (ref 26.0–34.0)
MCHC: 35.1 g/dL (ref 30.0–36.0)
MCV: 85.2 fL (ref 80.0–100.0)
Monocytes Absolute: 0.8 K/uL (ref 0.1–1.0)
Monocytes Relative: 8 %
Neutro Abs: 5.3 K/uL (ref 1.7–7.7)
Neutrophils Relative %: 55 %
Platelets: 330 K/uL (ref 150–400)
RBC: 4.54 MIL/uL (ref 3.87–5.11)
RDW: 12.2 % (ref 11.5–15.5)
WBC: 9.8 K/uL (ref 4.0–10.5)
nRBC: 0 % (ref 0.0–0.2)

## 2024-03-19 LAB — COMPREHENSIVE METABOLIC PANEL WITH GFR
ALT: 16 U/L (ref 0–44)
AST: 13 U/L — ABNORMAL LOW (ref 15–41)
Albumin: 4.4 g/dL (ref 3.5–5.0)
Alkaline Phosphatase: 73 U/L (ref 38–126)
Anion gap: 12 (ref 5–15)
BUN: 8 mg/dL (ref 6–20)
CO2: 24 mmol/L (ref 22–32)
Calcium: 9.9 mg/dL (ref 8.9–10.3)
Chloride: 105 mmol/L (ref 98–111)
Creatinine, Ser: 0.81 mg/dL (ref 0.44–1.00)
GFR, Estimated: >60 mL/min (ref >60)
Glucose, Bld: 75 mg/dL (ref 70–99)
Potassium: 3.6 mmol/L (ref 3.5–5.1)
Sodium: 141 mmol/L (ref 135–145)
Total Bilirubin: 0.4 mg/dL (ref 0.0–1.2)
Total Protein: 7.3 g/dL (ref 6.5–8.1)

## 2024-03-19 LAB — URINALYSIS, ROUTINE W REFLEX MICROSCOPIC
Bilirubin Urine: NEGATIVE
Glucose, UA: NEGATIVE mg/dL
Hgb urine dipstick: NEGATIVE
Ketones, ur: NEGATIVE mg/dL
Leukocytes,Ua: NEGATIVE
Nitrite: NEGATIVE
Protein, ur: NEGATIVE mg/dL
Specific Gravity, Urine: 1.005 (ref 1.005–1.030)
pH: 6.5 (ref 5.0–8.0)

## 2024-03-19 LAB — PREGNANCY, URINE: Preg Test, Ur: NEGATIVE

## 2024-03-19 LAB — LIPASE, BLOOD: Lipase: 38 U/L (ref 11–51)

## 2024-03-19 NOTE — Discharge Instructions (Signed)
 Take 8 scoops of miralax in 32oz of whatever you would like to drink.(Gatorade comes in this size) You can also use a fleets enema which you can buy over the counter at the pharmacy.  Return for worsening abdominal pain, vomiting or fever.  As we discussed please mention your symptoms to your spinal surgeon.  Please return for numbness or weakness to your arms or legs.

## 2024-03-19 NOTE — ED Triage Notes (Signed)
 C/o lower abd pain and trouble urinating States some episodes of urinary incontinence.   Neck surgery on August 24th. Arrived wearing c-collar.

## 2024-03-19 NOTE — ED Provider Notes (Signed)
 [place] EMERGENCY DEPARTMENT AT [facility] Provider Note   CSN: [id number] Arrival date & time: 03/19/24  1029     Patient presents with: Abdominal Pain   Nicole Rodriguez is a 33 y.o. female.   33 yo F with a chief complaint of difficulty urinating.  She has felt this way since she had a procedure done on her C-spine almost a month ago.  She has not mentioned this to her surgeon.  She denies weakness or numbness to her arms or legs.  Denies low back pain.  She has been regularly taking narcotics for this and this is the first day that she did not take her pain medicines.  She feels like she has an ache to her right lower quadrant.  She had been having issues moving her bowels.  Went about 2 weeks without a bowel movement.  Had some improvement with laxatives.  No fevers no vomiting.   Abdominal Pain      Prior to Admission medications   Medication Sig Start Date End Date Taking? Authorizing Provider  methocarbamol  (ROBAXIN ) 500 MG tablet Take 500 mg by mouth every 8 (eight) hours as needed for muscle spasms. 03/11/24  Yes [provider]  ondansetron  (ZOFRAN -ODT) 4 MG disintegrating tablet Take 4 mg by mouth 2 (two) times daily. nausea 03/11/24  Yes [provider]  Oxycodone  HCl 10 MG TABS Take 10 mg by mouth every 8 (eight) hours as needed. Neck pain 02/26/24  Yes [provider]  AIRSUPRA  90-80 MCG/ACT AERO Inhale 2 puffs into the lungs as needed (every four to six hours for cough, wheeze, shortness of breath. Rinse mouth). 01/07/24   Iva Marty Saltness, MD  albuterol  (PROVENTIL ) (2.5 MG/3ML) 0.083% nebulizer solution Take 3 mLs (2.5 mg total) by nebulization every 6 (six) hours as needed for wheezing or shortness of breath. 01/21/24   Iva Marty Saltness, MD  albuterol  (VENTOLIN  HFA) 108 [phone] Base) MCG/ACT inhaler Inhale 2 puffs into the lungs every 6 (six) hours as needed for wheezing or shortness of breath. Patient not taking: Reported on  02/13/2024 10/24/23   Cheryl Reusing, FNP  Azelastine  HCl 137 MCG/SPRAY SOLN Place 2 sprays in each nostril twice a day as needed for runny nose/drainage down throat 10/24/23   Cheryl Reusing, FNP  buPROPion  (WELLBUTRIN  SR) 200 MG 12 hr tablet Take 1 tablet (200 mg total) by mouth 2 (two) times daily. 03/18/24   Plovsky, Elna, MD  clonazePAM  (KLONOPIN ) 1 MG tablet 2 qhs 03/18/24   Plovsky, Elna, MD  fluticasone  (FLONASE ) 50 MCG/ACT nasal spray SPRAY 2 SPRAYS INTO EACH NOSTRIL EVERY DAY AS NEEDED FOR STUFFY NOSE Patient taking differently: Place 2 sprays into both nostrils daily. 10/24/23   Cheryl Reusing, FNP  fluticasone -salmeterol (ADVAIR DISKUS) 250-50 MCG/ACT AEPB Inhale 1 puff twice a day to help prevent cough and wheeze.  Rinse mouth out afterwards 01/07/24   Iva Marty Saltness, MD  gabapentin (NEURONTIN) 300 MG capsule Take 300 mg by mouth 3 (three) times daily. 08/11/23   [provider]  levocetirizine (XYZAL ) 5 MG tablet Take one tablet once a day as needed for runny nose Patient taking differently: Take 5 mg by mouth every evening. 01/07/24   Iva Marty Saltness, MD  linaclotide  (LINZESS ) 72 MCG capsule Take 1 capsule (72 mcg total) by mouth daily before breakfast. 03/13/24   Iva Marty Saltness, MD  montelukast  (SINGULAIR ) 10 MG tablet Take 1 tablet (10 mg total) by mouth at bedtime. 01/07/24   Iva,  Marty Saltness, MD  ondansetron  (ZOFRAN ) 4 MG tablet Take 1 tablet (4 mg total) by mouth every 8 (eight) hours as needed for nausea or vomiting. 02/21/24   Burnetta Aures, MD  SUMAtriptan  (IMITREX ) 50 MG tablet Take 1 tablet (50 mg total) by mouth once for 1 dose. May repeat in 2 hours if headache persists or recurs. 01/30/24 02/21/24  Gladis Elsie BROCKS, PA-C  Vonoprazan Fumarate  (VOQUEZNA ) 10 MG TABS Take 1 tablet by mouth daily. 01/07/24   Iva Marty Saltness, MD    Allergies: Amoxicillin, Asa [aspirin], Cefoxitin, and Sulfa antibiotics    Review of Systems  Gastrointestinal:   Positive for abdominal pain.    Updated Vital Signs BP 132/83 (BP Location: Right Arm)   Pulse (!) 110   Temp 97.7 F (36.5 C) (Oral)   Resp 18   LMP 02/06/2024   SpO2 100%   Physical Exam Vitals and nursing note reviewed.  Constitutional:      General: She is not in acute distress.    Appearance: She is well-developed. She is not diaphoretic.  HENT:     Head: Normocephalic and atraumatic.  Eyes:     Pupils: Pupils are equal, round, and reactive to light.  Cardiovascular:     Rate and Rhythm: Normal rate and regular rhythm.     Heart sounds: No murmur heard.    No friction rub. No gallop.  Pulmonary:     Effort: Pulmonary effort is normal.     Breath sounds: No wheezing or rales.  Abdominal:     General: There is no distension.     Palpations: Abdomen is soft.     Tenderness: There is no abdominal tenderness.     Comments: Points to the right lower quadrant as area of pain.  No obvious discomfort on palpation.  Musculoskeletal:        General: No tenderness.     Cervical back: Normal range of motion and neck supple.  Skin:    General: Skin is warm and dry.  Neurological:     Mental Status: She is alert and oriented to person, place, and time.  Psychiatric:        Behavior: Behavior normal.     (all labs ordered are listed, but only abnormal results are displayed) Labs Reviewed  URINALYSIS, ROUTINE W REFLEX MICROSCOPIC - Abnormal; Notable for the following components:      Result Value   Color, Urine COLORLESS (*)    All other components within normal limits  COMPREHENSIVE METABOLIC PANEL WITH GFR - Abnormal; Notable for the following components:   AST 13 (*)    All other components within normal limits  PREGNANCY, URINE  CBC WITH DIFFERENTIAL/PLATELET  LIPASE, BLOOD    EKG: None  Radiology: CT Renal Stone Study Result Date: 03/19/2024 CLINICAL DATA:  Lower abdominal pain. EXAM: CT ABDOMEN AND PELVIS WITHOUT CONTRAST TECHNIQUE: Multidetector CT imaging  of the abdomen and pelvis was performed following the standard protocol without IV contrast. RADIATION DOSE REDUCTION: This exam was performed according to the departmental dose-optimization program which includes automated exposure control, adjustment of the mA and/or kV according to patient size and/or use of iterative reconstruction technique. COMPARISON:  September 02, 2023. FINDINGS: Lower chest: 5 mm nodule is noted in right middle lobe. Hepatobiliary: No focal liver abnormality is seen. No gallstones, gallbladder wall thickening, or biliary dilatation. Pancreas: Unremarkable. No pancreatic ductal dilatation or surrounding inflammatory changes. Spleen: Normal in size without focal abnormality. Adrenals/Urinary Tract: Adrenal glands are  unremarkable. Kidneys are normal, without renal calculi, focal lesion, [name]; Radiology 2017; 284:228-243. 2. No acute abnormality seen in the abdomen or pelvis. Electronically Signed   By: Lynwood Landy Raddle M.D.   On: 03/19/2024 11:54     Procedures   Medications Ordered in the ED - No data to display                                  Medical Decision Making Amount and/or Complexity of Data Reviewed Labs: ordered. Radiology: ordered.   33 yo F with the  chief complaint of difficulty urinating and right lower abdominal discomfort.  This been going on she thinks for about a month.  She did not notice having pain in her abdomen until today she thinks maybe it was obscured by taking pain medicine from her recent spinal procedure.  She tells me she has had issues since the surgery had happened.  I think it would be uncommon that she would develop bladder incontinence from a C-spine procedure.  She has no leg weakness or numbness, no bowel incontinence.  And will BladderScan her here.  CT stone study.  UA Preg.  CT stone study on my independent interpretation without obvious ureterolithiasis.  In the area of discomfort the patient has a fairly large stool burden.  I did discuss this with her.  Radiology read negative.  Less than 50 cc in the bladder on bladder scan.  UA negative for infection.  Will discharge home.  Orthospine follow-up.  12:03 PM:  I have discussed the diagnosis/risks/treatment options with the patient.  Evaluation and diagnostic testing in the emergency department does not suggest an emergent condition requiring admission or immediate intervention beyond what has been performed at this time.  They will follow up with PCP. We also discussed returning to the ED immediately if new or worsening sx occur. We discussed the sx which are most concerning (e.g., sudden worsening pain, fever, inability to tolerate by mouth) that necessitate immediate return. Medications administered to the patient during their visit and any new prescriptions provided to the patient are listed below.  Medications given during this visit Medications - No data to display   The patient appears reasonably screen and/or stabilized for discharge and I doubt any other medical condition or other [facility] requiring further screening, evaluation, or treatment in the ED at this time prior to discharge.        Final diagnoses:  Right lower quadrant abdominal pain     ED Discharge Orders     None          Emil Share, DO 03/19/24 1203

## 2024-03-19 NOTE — ED Notes (Signed)
 DC paperwork given and verbally understood.

## 2024-03-20 ENCOUNTER — Telehealth: Payer: Self-pay | Admitting: Allergy & Immunology

## 2024-03-20 NOTE — Telephone Encounter (Signed)
 [place] was internally referred to [facility].  [place] is scheduled for 03/26/24 at 11:00 am with Dr. Isadora.

## 2024-03-24 ENCOUNTER — Ambulatory Visit (INDEPENDENT_AMBULATORY_CARE_PROVIDER_SITE_OTHER): Admitting: Acute Care

## 2024-03-24 ENCOUNTER — Encounter: Payer: Self-pay | Admitting: Acute Care

## 2024-03-24 VITALS — BP 116/80 | HR 99 | Ht 63.0 in | Wt 184.0 lb

## 2024-03-24 DIAGNOSIS — J45909 Unspecified asthma, uncomplicated: Secondary | ICD-10-CM | POA: Diagnosis not present

## 2024-03-24 DIAGNOSIS — R911 Solitary pulmonary nodule: Secondary | ICD-10-CM

## 2024-03-24 DIAGNOSIS — F17200 Nicotine dependence, unspecified, uncomplicated: Secondary | ICD-10-CM | POA: Diagnosis not present

## 2024-03-24 DIAGNOSIS — R9389 Abnormal findings on diagnostic imaging of other specified body structures: Secondary | ICD-10-CM

## 2024-03-24 NOTE — Patient Instructions (Addendum)
 It is good to see you today. We have looked at your chest CT's. There is a small 5 mm lung nodule in your right middle lobe. Looking back, this was also on your scan 6 months ago.  We will do a 6 month follow up Ct chest to monitor for growth. This will be due 06/2024. You will get a call to get this scheduled closer to the time. Call for any blood in your sputum, or unexplained weight loss.  Please check your basement for Radon with a test kit( you can buy these at [facility]) Please work on quitting vaping.  I know this is hard, but it will help maintain better health. Call if you need us . You can receive free nicotine replacement therapy (patches, gum, or mints) by calling 1-800-QUIT NOW. Please call so we can get you on the path to becoming a non-smoker. I know it is hard, but you can do this!  Hypnosis for smoking cessation  [employer] [phone]  Acupuncture for smoking cessation  [employer] [phone]

## 2024-03-24 NOTE — Progress Notes (Signed)
 History of Present Illness Nicole Rodriguez is a 33 y.o. female never cigarette smoker, but Nicole Rodriguez has vaped for about 3 years, who presents for a lung nodule consult. Nicole Rodriguez will be followed by Dr. Shelah   03/24/2024 Discussed the use of AI scribe software for clinical note transcription with the patient, who gave verbal consent to proceed.  Synopsis Pt. Presents to the ED on 03/19/2024 for difficulty urinating and right lower quadrant pain.Nicole Rodriguez had recently had a C5-6 cervical fusion, and had been on pain medication . A Ct Abdomen and pelvis was done to evaluate for etiology and there was an incidental finding of a 5 mm right middle lobe  pulmonary nodule that was not noted on previous imaging . Pt. Is here for a lung nodule consult.  History of Present Illness A 33 year old female presents for follow-up of a pulmonary nodule discovered during a CT scan for urinary retention and right lower quadrant pain.  Nicole Rodriguez initially presented to the emergency department with urinary retention and right lower quadrant pain, [name], [name], but it was not noted at that time and therefore there was no size indicated.. To my eye it appears to be about the same size. No hemoptysis or unintentional weight loss.  Nicole Rodriguez has a history of asthma and uses Advair [address] as needed, along with a nebulizer for illness-related exacerbations. Nicole Rodriguez experiences occasional shortness of breath with activity.  Nicole Rodriguez has been vaping for about three years and previously had benzene exposure while working at [employer]. Nicole Rodriguez lives in a house with a basement but has not checked for radon.  Family history includes lung cancer in her paternal grandfather and breast cancer on her maternal side. Nicole Rodriguez had a benign breast lump removed at  age 28 or 81.     Test Results: CT Abdomen and Pelvis 03/19/2024 Lower chest: 5 mm nodule is noted in right middle lobe.      Latest Ref Rng & Units 03/19/2024   11:05 AM 02/18/2024    3:00 PM 01/03/2024    4:55 PM  CBC  WBC 4.0 - 10.5 K/uL 9.8  11.5  11.5   Hemoglobin 12.0 - 15.0 g/dL 86.3  85.8  87.2   Hematocrit 36.0 - 46.0 % 38.7  42.2  38.3   Platelets 150 - 400 K/uL 330  371  326        Latest Ref Rng & Units 03/19/2024   11:05 AM 02/18/2024    3:00 PM 09/02/2023    2:15 PM  BMP  Glucose 70 - 99 mg/dL 75  92  89   BUN 6 - 20 mg/dL 8  7  12    Creatinine 0.44 - 1.00 mg/dL 9.18  9.29  9.15   Sodium 135 - 145 mmol/L 141  136  137   Potassium 3.5 - 5.1 mmol/L 3.6  3.7  3.9   Chloride 98 - 111 mmol/L 105  100  102   CO2 22 - 32 mmol/L 24  24  24    Calcium 8.9 - 10.3 mg/dL 9.9  9.6  9.3     BNP    Component Value Date/Time   BNP 5.0 08/06/2021 1317    ProBNP No results found for: PROBNP  PFT  Component Value Date/Time   FEV1PRE 2.86 01/29/2019 1111   FEV1POST 2.75 01/29/2019 1111   FVCPRE 3.29 01/29/2019 1111   FVCPOST 3.31 01/29/2019 1111   TLC 5.08 12/14/2017 0854   DLCOUNC 21.63 12/14/2017 0854   PREFEV1FVCRT 87 01/29/2019 1111   PSTFEV1FVCRT 83 01/29/2019 1111    CT Renal Stone Study Result Date: 03/19/2024 CLINICAL DATA:  Lower abdominal pain. EXAM: CT ABDOMEN AND PELVIS WITHOUT CONTRAST TECHNIQUE: Multidetector CT imaging of the abdomen and pelvis was performed following the standard protocol without IV contrast. RADIATION DOSE REDUCTION: This exam was performed according to the departmental dose-optimization program which includes automated exposure control, adjustment of the mA and/or kV according to patient size and/or use of iterative reconstruction technique. COMPARISON:  September 02, 2023. FINDINGS: Lower chest: 5 mm nodule is noted in right middle lobe. Hepatobiliary: No focal liver abnormality is seen. No gallstones, gallbladder wall thickening, [name], without renal calculi, focal lesion, [name]; Radiology 2017; 284:228-243. 2. No acute abnormality seen in the abdomen or pelvis. Electronically Signed   By: Lynwood Landy Raddle M.D.   On: 03/19/2024 11:54     Past medical hx Past Medical History:  Diagnosis Date   Asthma    Bipolar disorder (HCC)    Generalized anxiety disorder    GERD (gastroesophageal reflux disease)    Phreesia 08/17/2020   IBS (irritable bowel syndrome)    Plantar fasciitis 10/15/2018   Rectal bleeding 09/24/2019   Social anxiety disorder      Social History   Tobacco Use   Smoking status: Every Day    Types: Cigarettes    Passive exposure: Never   Smokeless tobacco: Never   Tobacco comments:    Uses vapes  Vaping Use   Vaping status: Never Used  Substance Use Topics   Alcohol use: Yes    Comment: socially    Drug use: No    Nicole Rodriguez reports that Nicole Rodriguez has been smoking cigarettes. Nicole Rodriguez has never been exposed to tobacco smoke. Nicole Rodriguez has never used smokeless tobacco.  Nicole Rodriguez reports current alcohol use. Nicole Rodriguez reports that Nicole Rodriguez does not use drugs.  Tobacco Cessation: Vapes x 3 years Current every day vaper, I spent 3-4 minutes counseling patient on  steps to stop use of tobacco products. I have provided patient with information on receiving free nicotine replacement therapy, and contact numbers for hypnosis for smoking cessation as well as acupuncture for smoking cessation.   Past surgical hx, Family hx, Social hx all reviewed.  Current Outpatient Medications on File Prior to Visit  Medication Sig   AIRSUPRA  90-80 MCG/ACT AERO Inhale 2 puffs into the lungs as needed (every four to six hours for cough, wheeze, shortness of breath. Rinse mouth).   albuterol  (PROVENTIL ) (2.5 MG/3ML) 0.083% nebulizer solution Take  3 mLs (2.5 mg total) by nebulization every 6 (six) hours as needed for wheezing or shortness of breath.   Azelastine  HCl 137 MCG/SPRAY SOLN Place 2 sprays in each nostril twice a day as needed for runny nose/drainage down throat   buPROPion  (WELLBUTRIN  SR) 200 MG 12 hr tablet Take 1 tablet (200 mg total) by mouth 2 (two) times daily.   clonazePAM  (KLONOPIN ) 1 MG tablet 2 qhs   cyclobenzaprine (FLEXERIL) 10 MG tablet Take 10 mg by mouth 3 (three) times daily as needed for muscle spasms.   fluticasone  (FLONASE ) 50 MCG/ACT nasal spray SPRAY 2 SPRAYS INTO EACH NOSTRIL EVERY DAY AS NEEDED FOR STUFFY NOSE   fluticasone -salmeterol (ADVAIR DISKUS) 250-50 MCG/ACT AEPB Inhale 1 puff twice a day to help prevent cough and wheeze.  Rinse mouth out afterwards   gabapentin (NEURONTIN) 300 MG capsule Take 300 mg by mouth 3 (three) times daily.   levocetirizine (XYZAL ) 5 MG tablet Take one tablet once a day as needed for runny nose   linaclotide  (LINZESS ) 72 MCG capsule Take 1 capsule (72 mcg total) by mouth daily before breakfast.   methocarbamol  (ROBAXIN ) 500 MG tablet Take 500 mg by mouth every 8 (eight) hours as needed for muscle spasms.   montelukast  (SINGULAIR ) 10 MG  tablet Take 1 tablet (10 mg total) by mouth at bedtime.   ondansetron  (ZOFRAN ) 4 MG tablet Take 1 tablet (4 mg total) by mouth every 8 (eight) hours as needed for nausea or vomiting.   Oxycodone  HCl 10 MG TABS Take 10 mg by mouth every 8 (eight) hours as needed. Neck pain   Vonoprazan Fumarate  (VOQUEZNA ) 10 MG TABS Take 1 tablet by mouth daily.   No current facility-administered medications on file prior to visit.     Allergies  Allergen Reactions   Dexamethasone  Other (See Comments)    Flares acid reflux   Amoxicillin Hives   Asa [Aspirin] Hives   Cefoxitin Itching and Rash   Sulfa Antibiotics Hives    Review Of Systems:  Constitutional:   No  weight loss, night sweats,  Fevers, chills, fatigue, or  lassitude.  HEENT:   No headaches,  Difficulty swallowing,  Tooth/dental problems, or  Sore throat,                No sneezing, itching, ear ache, nasal congestion, post nasal drip,   CV:  No chest pain,  Orthopnea, PND, swelling in lower extremities, anasarca, dizziness, palpitations, syncope.   GI  No heartburn, indigestion, abdominal pain, nausea, vomiting, diarrhea, change in bowel habits, loss of appetite, bloody stools.   Resp: No shortness of breath with exertion or at rest.  No excess mucus, no productive cough,  No non-productive cough,  No coughing up of blood.  No change in color of mucus.  No wheezing.  No chest wall deformity  Skin: no rash or lesions.  GU: no dysuria, change in color of urine, no urgency or frequency.  No flank pain, no hematuria   MS:  No joint pain or swelling.  No decreased range of motion.  No back pain.Currently in a   Psych:  No change in mood or affect. No depression or anxiety.  No memory loss.   Vital Signs BP 116/80   Pulse 99   Ht 5' 3 (1.6 m)   Wt 184 lb (83.5 kg)   SpO2 99%   BMI 32.59 kg/m    Physical Exam:  General- No distress,  A&Ox3, pleasant ENT: No sinus  tenderness, TM clear, pale nasal mucosa, no oral exudate,no  post nasal drip, no LAN Cardiac: S1, S2, regular rate and rhythm, no murmur Chest: No wheeze/ rales/ dullness; no accessory muscle use, no nasal flaring, no sternal retractions Abd.: Soft Non-tender, ND, BS +, Body mass index is 32.59 kg/m.  Ext: No clubbing cyanosis, edema, no obvious deformities Neuro:  normal strength, MAE x 4, A&O x 3, pleasant Skin: No rashes, warm and dry, no obvious skin lesions Psych: normal mood and behavior   Assessment & Plan Solitary pulmonary nodule, right middle lobe lung Low suspicion for malignancy due to low risk factors. Possible benign etiology, but surveillance needed due to family cancer history and potential benzene and radon exposure. - Order follow-up CT scan in three months, due 06/2024. - Advise obtaining radon test kit for basement. - Instruct to return if hemoptysis or unexplained weight loss occurs.  Asthma Managed with Advair and Air Supra. Occasional exertional dyspnea consistent with asthma. - Continue current plan  Nicotine dependence (vaping) Vaping for three years. Advised cessation due to risks including lung inflammation and potential contribution to pulmonary nodule. - Advise cessation of vaping. - Resources provided  I spent 25 minutes dedicated to the care of this patient on the date of this encounter to include pre-visit review of records, face-to-face time with the patient discussing conditions above, post visit ordering of testing, clinical documentation with the electronic health record, making appropriate referrals as documented, and communicating necessary information to the patient's healthcare team.   AVS 03/24/2024 We have looked at your chest CT's. There is a small 5 mm lung nodule in your right middle lobe. Looking back, this was also on your scan 6 months ago.  We will do a 6 month follow up Ct chest to monitor for growth. This will be due 06/2024. You will get a call to get this scheduled closer to the time. Call  for any blood in your sputum, or unexplained weight loss.  Please check your basement for Radon with a test kit( you can buy these at [facility]) Please work on quitting vaping.  I know this is hard, but it will help maintain better health. Call if you need us . You can receive free nicotine replacement therapy (patches, gum, or mints) by calling 1-800-QUIT NOW. Please call so we can get you on the path to becoming a non-smoker. I know it is hard, but you can do this!  Hypnosis for smoking cessation  [employer] [phone]  Acupuncture for smoking cessation  [facility] [phone]       Nicole JULIANNA Lites, NP 03/24/2024  9:21 AM

## 2024-03-26 ENCOUNTER — Ambulatory Visit: Admitting: Student in an Organized Health Care Education/Training Program

## 2024-04-16 ENCOUNTER — Other Ambulatory Visit: Payer: Self-pay | Admitting: Family

## 2024-05-01 ENCOUNTER — Encounter: Payer: Self-pay | Admitting: Allergy & Immunology

## 2024-05-06 ENCOUNTER — Ambulatory Visit (HOSPITAL_BASED_OUTPATIENT_CLINIC_OR_DEPARTMENT_OTHER): Payer: Self-pay | Admitting: Psychiatry

## 2024-05-06 ENCOUNTER — Encounter (HOSPITAL_COMMUNITY): Payer: Self-pay

## 2024-05-06 DIAGNOSIS — F320 Major depressive disorder, single episode, mild: Secondary | ICD-10-CM | POA: Diagnosis not present

## 2024-05-06 DIAGNOSIS — F3181 Bipolar II disorder: Secondary | ICD-10-CM

## 2024-05-06 MED ORDER — CLONAZEPAM 1 MG PO TABS: ORAL_TABLET | ORAL | 4 refills | Status: DC

## 2024-05-06 MED ORDER — SERTRALINE HCL 100 MG PO TABS: ORAL_TABLET | ORAL | 4 refills | Status: AC

## 2024-05-06 NOTE — Progress Notes (Signed)
 [facility] MD/PA/NP OP Progress Note  05/06/2024 4:09 PM Nicole Rodriguez  MRN:  [id number] Interview was conducted in person and I verified that I was speaking with the correct person using two identifiers. I discussed the limitations of evaluation and management by telemedicine and  the availability of in person appointments. Patient expressed understanding and agreed to proceed. Participants in the visit: patient (location - home); physician (location - home office).  Chief Complaint:  Panic attacks.  Landry Seat.  Visit Diagnosis:    Today the patient is not doing all that well.  For the last 2 months she has been feeling depressed.  It is pretty much at the plateau.  But it is persistent.  Her sleep is also been interrupted.  She feels sleepy during the day.  Sometimes she can get back to sleep.  Her appetite is good.  Her energy level is low.  She has no problems thinking or concentrating her psychomotor functioning is normal.  She denies a feeling of worthlessness.  She is not suicidal.  The patient has been on Wellbutrin  for 7 years.  Today we will go to have her reduce her Wellbutrin  to taking 1 in the morning for a week and discontinue it.  She is going to start on Zoloft .  A big stress in her life I think is related to her daughter.  Her daughter is having difficulty time coping.  Regards name is Nicole Rodriguez.  She is 33 years old.  Her daughter is having a lot of problems and the patient is concerned.  The patient's job is still doing pretty well.  She works for a careers information officer.  Noted also that she herself had neck surgery which actually went well.  So physically she is fairly stable.  Her job is stable.  Her relationship with Magen is good.  The patient acknowledges that her ability to enjoy things are going down particular related to her sexual energy.  She still enjoys the television has a pet.  In the past she was diagnosed with bipolar disorder and tried many medicines for this condition.  I do not think  that diagnosis is accurate.  I connected with Laymon Chute on 05/06/24 at  3:30 PM EST by telephone and verified that I am speaking with the correct person using two identifiers.  Location: Patient: home Provider: office   I discussed the limitations, risks, security and privacy concerns of performing an evaluation and management service by telephone and the availability of in person appointments. I also discussed with the patient that there may be a patient responsible charge related to this service. The patient expressed understanding and agreed to proceed.      I discussed the assessment and treatment plan with the patient. The patient was provided an opportunity to ask questions and all were answered. The patient agreed with the plan and demonstrated an understanding of the instructions.   The patient was advised to call back or seek an in-person evaluation if the symptoms worsen or if the condition fails to improve as anticipated.  I provided 30 minutes of non-face-to-face time during this encounter.   Elna LILLETTE Lo, MD    ICD-10-CM   1. Bipolar 2 disorder (HCC)  F31.81 clonazePAM  (KLONOPIN ) 1 MG tablet      Past Psychiatric History: Please see intake H&P.  Past Medical History:  Past Medical History:  Diagnosis Date   Asthma    Bipolar disorder (HCC)    Generalized anxiety disorder  GERD (gastroesophageal reflux disease)    Phreesia 08/17/2020   IBS (irritable bowel syndrome)    Plantar fasciitis 10/15/2018   Rectal bleeding 09/24/2019   Social anxiety disorder     Past Surgical History:  Procedure Laterality Date   ANTERIOR CERVICAL DECOMP/DISCECTOMY FUSION N/A 02/21/2024   Procedure: ANTERIOR CERVICAL DECOMPRESSION/DISCECTOMY FUSION CERVICAL FIVE-CERVICAL SIX;  Surgeon: Burnetta Aures, MD;  [facility];  Service: Orthopedics;  Laterality: N/A;   APPENDECTOMY     BREAST LUMPECTOMY     COLONOSCOPY WITH PROPOFOL  N/A 12/03/2019   Procedure: COLONOSCOPY  WITH PROPOFOL ;  Surgeon: Janalyn Keene NOVAK, MD;  Location: [facility] END[facility]COPY;  Service: Endoscopy;  Laterality: N/A;   CYSTOSCOPY W/ URETERAL STENT PLACEMENT Right 08/31/2021   Procedure: CYSTOSCOPY WITH BILATERAL RETROGRADE PYELOGRAM;  Surgeon: Roseann Adine PARAS., MD;  Location: [facility];  Service: Urology;  Laterality: Right;   ESOPHAGOGASTRODUODENOSCOPY (EGD) WITH PROPOFOL  N/A 12/03/2019   Procedure: ESOPHAGOGASTRODUODENOSCOPY (EGD) WITH PROPOFOL ;  Surgeon: Janalyn Keene NOVAK, MD;  Location: [facility] END[facility]COPY;  Service: Endoscopy;  Laterality: N/A;   eunlar relocation Left 11/07/2022   FOOT SURGERY     URETEROSCOPY Right 08/31/2021   Procedure: URETEROSCOPY;  Surgeon: Roseann Adine PARAS., MD;  Location: [facility];  Service: Urology;  Laterality: Right;    Family Psychiatric History: Reviewed.  Family History:  Family History  Problem Relation Age of Onset   Allergic rhinitis Father    Asthma Father    Crohn's disease Sister        half sister   Anxiety disorder Maternal Grandmother    Depression Maternal Grandmother    Diverticulitis Paternal Grandmother    Diabetes Other    Heart failure Other    Colon cancer Neg Hx    Celiac disease Neg Hx     Social History:  Social History   Socioeconomic History   Marital status: Married    Spouse name: Not on file   Number of children: 1   Years of education: Not on file   Highest education level: Not on file  Occupational History   Not on file  Tobacco Use   Smoking status: Every Day    Types: Cigarettes    Passive exposure: Never   Smokeless tobacco: Never   Tobacco comments:    Uses vapes  Vaping Use   Vaping status: Never Used  Substance and Sexual Activity   Alcohol use: Yes    Comment: socially    Drug use: No   Sexual activity: Yes    Birth control/protection: None  Other Topics Concern   Not on file  Social History Narrative   Daughter 22 years old.   Social Drivers of Corporate Investment Banker Strain:  Not on file  Food Insecurity: Not on file  Transportation Needs: Not on file  Physical Activity: Not on file  Stress: Not on file  Social Connections: Not on file    Allergies:  Allergies  Allergen Reactions   Dexamethasone  Other (See Comments)    Flares acid reflux   Amoxicillin Hives   Asa [Aspirin] Hives   Cefoxitin Itching and Rash   Sulfa Antibiotics Hives    Metabolic Disorder Labs: Lab Results  Component Value Date   HGBA1C 5.2 02/01/2017   No results found for: PROLACTIN Lab Results  Component Value Date   CHOL 159 02/01/2017   TRIG 85 02/01/2017   HDL 44 02/01/2017   CHOLHDL 3.6 02/01/2017   LDLCALC 98 02/01/2017   LDLCALC 112 (H) 09/14/2016  Lab Results  Component Value Date   TSH 1.500 09/14/2016    Therapeutic Level Labs: No results found for: LITHIUM No results found for: VALPROATE No results found for: CBMZ  Current Medications: Current Outpatient Medications  Medication Sig Dispense Refill   sertraline  (ZOLOFT ) 100 MG tablet Half  qam  for 6 days then 1  qam 30 tablet 4   AIRSUPRA  90-80 MCG/ACT AERO Inhale 2 puffs into the lungs as needed (every four to six hours for cough, wheeze, shortness of breath. Rinse mouth). 10.7 g 1   albuterol  (PROVENTIL ) (2.5 MG/3ML) 0.083% nebulizer solution Take 3 mLs (2.5 mg total) by nebulization every 6 (six) hours as needed for wheezing or shortness of breath. 75 mL 3   Azelastine  HCl 137 MCG/SPRAY SOLN Place 2 sprays in each nostril twice a day as needed for runny nose/drainage down throat 30 mL 3   buPROPion  (WELLBUTRIN  SR) 200 MG 12 hr tablet Take 1 tablet (200 mg total) by mouth 2 (two) times daily. 60 tablet 1   clonazePAM  (KLONOPIN ) 1 MG tablet 2 qhs 60 tablet 4   cyclobenzaprine (FLEXERIL) 10 MG tablet Take 10 mg by mouth 3 (three) times daily as needed for muscle spasms.     fluticasone  (FLONASE ) 50 MCG/ACT nasal spray SPRAY 2 SPRAYS INTO EACH NOSTRIL EVERY DAY AS NEEDED FOR STUFFY NOSE 16 g 5    fluticasone -salmeterol (ADVAIR DISKUS) 250-50 MCG/ACT AEPB Inhale 1 puff twice a day to help prevent cough and wheeze.  Rinse mouth out afterwards 1 each 5   gabapentin (NEURONTIN) 300 MG capsule Take 300 mg by mouth 3 (three) times daily.     levocetirizine (XYZAL ) 5 MG tablet Take one tablet once a day as needed for runny nose 30 tablet 5   linaclotide  (LINZESS ) 72 MCG capsule Take 1 capsule (72 mcg total) by mouth daily before breakfast. 90 capsule 1   methocarbamol  (ROBAXIN ) 500 MG tablet Take 500 mg by mouth every 8 (eight) hours as needed for muscle spasms.     montelukast  (SINGULAIR ) 10 MG tablet Take 1 tablet (10 mg total) by mouth at bedtime. 30 tablet 5   ondansetron  (ZOFRAN ) 4 MG tablet Take 1 tablet (4 mg total) by mouth every 8 (eight) hours as needed for nausea or vomiting. 20 tablet 0   Oxycodone  HCl 10 MG TABS Take 10 mg by mouth every 8 (eight) hours as needed. Neck pain     Vonoprazan Fumarate  (VOQUEZNA ) 10 MG TABS Take 1 tablet by mouth daily. 30 tablet 5   No current facility-administered medications for this visit.      Psychiatric Specialty Exam: Review of Systems  Psychiatric/Behavioral:  The patient is nervous/anxious.   All other systems reviewed and are negative.   There were no vitals taken for this visit.There is no height or weight on file to calculate BMI.  General Appearance: NA  Eye Contact:  NA  Speech:  Clear and Coherent and Normal Rate  Volume:  Normal  Mood:  Anxious  Affect:  NA  Thought Process:  Goal Directed  Orientation:  Full (Time, Place, and Person)  Thought Content: Rumination   Suicidal Thoughts:  No  Homicidal Thoughts:  No  Memory:  Immediate;   Good Recent;   Good Remote;   Good  Judgement:  Good  Insight:  Fair  Psychomotor Activity:  NA  Concentration:  Concentration: Fair  Recall:  Good  Fund of Knowledge: Good  Language: Good  Akathisia:  Negative  Handed:  Right  AIMS (if indicated): not done  Assets:  Communication  Skills Desire for Improvement Financial Resources/Insurance Housing Social Support Talents/Skills  ADL's:  Intact  Cognition: WNL  Sleep:  Good   Screenings: GAD-7    Flowsheet Row Office Visit from 06/18/2019 in [place] Office Visit from 06/05/2019 in [place] Office Visit from 04/01/2019 in [facility]  Total GAD-7 Score 20 19 0   PHQ2-9    Flowsheet Row Office Visit from 03/08/2022 in [facility] Office Visit from 06/30/2021 in [facility] Video Visit from 11/15/2020 in [facility] Office Visit from 08/20/2020 in [facility] Office Visit from 06/18/2019 in [place]  PHQ-2 Total Score 0 0 0 0 5  PHQ-9 Total Score 0 -- -- 0 20   Flowsheet Row ED from 03/19/2024 in [facility] Emergency Department at [facility] Admission (Discharged) from 02/21/2024 in  [facility] ED from 09/02/2023 in [facility] Emergency Department at [facility]  C-SSRS RISK CATEGORY No Risk No Risk No Risk     Assessment and Plan:        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  I think her persistent depression is significant and the fact that her sleep is interrupted is a significant factor.  Today our plan is to double her Klonopin  to taking 1 mg 2 at night.  Today we will reduce and discontinue her Wellbutrin  and should begin on Zoloft  50 mg for a week and then increase it to a full dose of 100 mg.  Importantly also is that the patient will call [facility] an effort to try to get Zane Glatter a provider at [facility] to see her daughter.  This patient to return to see me in 7 weeks.   Dx: Bipolar 2 disorder rapid cycling; Mixed anxiety disorder (panic disorder' GAD, social anxiety)   Plan:  Continue Lamictal  to 200 mg at HS, Latuda  40 mg at HS, paroxetine  20 mg in PM, trazodone  50 mg prn sleep and bupropion  SR 200 mg bid for  fatigue. I will stop lorazepam  2 mg prn anxiety (up to tid) and start clonazepam  1 mg bid scheduled initially (then advised to start using it prn after anxiety decreases). We shall write her an excuse from work note for 3/26-4/18/22 period.  Next appointment with a new provider in 1 month. The plan was discussed with patient who had an opportunity to ask questions and these were all answered. I spend 15 minutes in phone consultation with the patient.    Elna LILLETTE Lo, MD 05/06/2024, 4:09 PM

## 2024-05-20 ENCOUNTER — Encounter (HOSPITAL_COMMUNITY): Payer: Self-pay

## 2024-05-21 ENCOUNTER — Other Ambulatory Visit (HOSPITAL_COMMUNITY): Payer: Self-pay | Admitting: Psychiatry

## 2024-05-21 MED ORDER — BUPROPION HCL ER (SR) 200 MG PO TB12: 200.0000 mg | ORAL_TABLET | Freq: Two times a day (BID) | ORAL | 4 refills | Status: DC

## 2024-05-21 NOTE — Telephone Encounter (Signed)
 Dr. Tasia called this patient

## 2024-06-21 ENCOUNTER — Ambulatory Visit (HOSPITAL_COMMUNITY)
Admission: RE | Admit: 2024-06-21 | Discharge: 2024-06-21 | Disposition: A | Discharge status: Home / Self Care | Source: Ambulatory Visit | Attending: Acute Care | Admitting: Acute Care

## 2024-06-21 DIAGNOSIS — R911 Solitary pulmonary nodule: Secondary | ICD-10-CM | POA: Insufficient documentation

## 2024-07-01 ENCOUNTER — Ambulatory Visit
Admission: EM | Admit: 2024-07-01 | Discharge: 2024-07-01 | Disposition: A | Discharge status: Home / Self Care | Source: Home / Self Care

## 2024-07-01 ENCOUNTER — Ambulatory Visit (HOSPITAL_COMMUNITY): Admitting: Psychiatry

## 2024-07-01 ENCOUNTER — Encounter: Payer: Self-pay | Admitting: Emergency Medicine

## 2024-07-01 ENCOUNTER — Ambulatory Visit (INDEPENDENT_AMBULATORY_CARE_PROVIDER_SITE_OTHER)

## 2024-07-01 DIAGNOSIS — S41111A Laceration without foreign body of right upper arm, initial encounter: Secondary | ICD-10-CM | POA: Diagnosis not present

## 2024-07-01 DIAGNOSIS — W540XXA Bitten by dog, initial encounter: Secondary | ICD-10-CM | POA: Diagnosis not present

## 2024-07-01 DIAGNOSIS — Z23 Encounter for immunization: Secondary | ICD-10-CM | POA: Diagnosis not present

## 2024-07-01 DIAGNOSIS — M79644 Pain in right finger(s): Secondary | ICD-10-CM | POA: Diagnosis not present

## 2024-07-01 MED ORDER — TETANUS-DIPHTH-ACELL PERTUSSIS 5-2-15.5 LF-MCG/0.5 IM SUSP
0.5000 mL | Freq: Once | INTRAMUSCULAR | Status: AC
  Administered 2024-07-01: 0.5 mL via INTRAMUSCULAR

## 2024-07-01 MED ORDER — CLINDAMYCIN HCL 300 MG PO CAPS: 300.0000 mg | ORAL_CAPSULE | Freq: Three times a day (TID) | ORAL | 0 refills | Status: AC

## 2024-07-01 NOTE — ED Provider Notes (Signed)
 " [facility]    CSN: [id number] Arrival date & time: 07/01/24  1833      History   Chief Complaint Chief Complaint  Patient presents with   Animal Bite    HPI Nicole Rodriguez is a 33 y.o. female.   The history is provided by the patient.   Patient presents for complaints of a dog bite to the right upper extremity.  Patient states that she was trying to break up her dogs, and states that she has a bite to the right upper arm with bruising, and to the right thumb.  She states that she fell several times, states she has pain and swelling in the right thumb with difficulty moving the right thumb.  Patient denies fever, chills, oozing, or drainage from the site.  States that her dog's rabies status is up-to-date.  She states she is unsure of her last tetanus vaccine.  Past Medical History:  Diagnosis Date   Asthma    Bipolar disorder (HCC)    Generalized anxiety disorder    GERD (gastroesophageal reflux disease)    Phreesia 08/17/2020   IBS (irritable bowel syndrome)    Plantar fasciitis 10/15/2018   Rectal bleeding 09/24/2019   Social anxiety disorder     Patient Active Problem List   Diagnosis Date Noted   Severe persistent asthma, uncomplicated (HCC) 11/01/2022   Viral URI 07/26/2022   Intermittent palpitations 08/20/2020   Gastric polyp    GAD (generalized anxiety disorder) 07/19/2019   Social anxiety disorder 07/19/2019   Panic disorder 07/19/2019   Bipolar 2 disorder (HCC) 07/19/2019   Other seasonal allergic rhinitis 08/19/2018   GERD (gastroesophageal reflux disease) 08/19/2018   Asthma 11/29/2017   Breast mass, right 03/18/2014   Numbness and tingling in both hands 01/26/2014    Past Surgical History:  Procedure Laterality Date   ANTERIOR CERVICAL DECOMP/DISCECTOMY FUSION N/A 02/21/2024   Procedure: ANTERIOR CERVICAL DECOMPRESSION/DISCECTOMY FUSION CERVICAL FIVE-CERVICAL SIX;  Surgeon: Burnetta Aures, MD;  Location: [facility];  Service: Orthopedics;   Laterality: N/A;   APPENDECTOMY     BREAST LUMPECTOMY     COLONOSCOPY WITH PROPOFOL  N/A 12/03/2019   Procedure: COLONOSCOPY WITH PROPOFOL ;  Surgeon: Janalyn Keene NOVAK, MD;  Location: [facility] END[facility]COPY;  Service: Endoscopy;  Laterality: N/A;   CYSTOSCOPY W/ URETERAL STENT PLACEMENT Right 08/31/2021   Procedure: CYSTOSCOPY WITH BILATERAL RETROGRADE PYELOGRAM;  Surgeon: Roseann Adine PARAS., MD;  Location: [facility];  Service: Urology;  Laterality: Right;   ESOPHAGOGASTRODUODENOSCOPY (EGD) WITH PROPOFOL  N/A 12/03/2019   Procedure: ESOPHAGOGASTRODUODENOSCOPY (EGD) WITH PROPOFOL ;  Surgeon: Janalyn Keene NOVAK, MD;  Location: [facility] END[facility];  Service: Endoscopy;  Laterality: N/A;   eunlar relocation Left 11/07/2022   FOOT SURGERY     URETEROSCOPY Right 08/31/2021   Procedure: URETEROSCOPY;  Surgeon: Roseann Adine PARAS., MD;  Location: [facility];  Service: Urology;  Laterality: Right;    OB History     Gravida  0   Para  0   Term  0   Preterm  0   AB  0   Living  0      SAB  0   IAB  0   Ectopic  0   Multiple  0   Live Births  0            Home Medications    Prior to Admission medications  Medication Sig Start Date End Date Taking? Authorizing Provider  clindamycin (CLEOCIN) 300 MG capsule Take 1 capsule (300 mg total)  by mouth 3 (three) times daily for 7 days. 07/01/24 07/08/24 Yes Leath-Warren, Etta PARAS, NP  AIRSUPRA  90-80 MCG/ACT AERO Inhale 2 puffs into the lungs as needed (every four to six hours for cough, wheeze, shortness of breath. Rinse mouth). 01/07/24   Iva Marty Saltness, MD  albuterol  (PROVENTIL ) (2.5 MG/3ML) 0.083% nebulizer solution Take 3 mLs (2.5 mg total) by nebulization every 6 (six) hours as needed for wheezing or shortness of breath. 01/21/24   Iva Marty Saltness, MD  Azelastine  HCl 137 MCG/SPRAY SOLN Place 2 sprays in each nostril twice a day as needed for runny nose/drainage down throat 10/24/23   Cheryl Reusing, FNP  buPROPion  (WELLBUTRIN  SR)  200 MG 12 hr tablet Take 1 tablet (200 mg total) by mouth 2 (two) times daily. 05/21/24   Plovsky, Elna, MD  clonazePAM  (KLONOPIN ) 1 MG tablet 2 qhs 05/06/24   Plovsky, Elna, MD  cyclobenzaprine (FLEXERIL) 10 MG tablet Take 10 mg by mouth 3 (three) times daily as needed for muscle spasms. 02/26/24   [provider]  fluticasone  (FLONASE ) 50 MCG/ACT nasal spray SPRAY 2 SPRAYS INTO EACH NOSTRIL EVERY DAY AS NEEDED FOR STUFFY NOSE 10/24/23   Cheryl Reusing, FNP  fluticasone -salmeterol (ADVAIR DISKUS) 250-50 MCG/ACT AEPB Inhale 1 puff twice a day to help prevent cough and wheeze.  Rinse mouth out afterwards 01/07/24   Iva Marty Saltness, MD  gabapentin (NEURONTIN) 300 MG capsule Take 300 mg by mouth 3 (three) times daily. 08/11/23   [provider]  levocetirizine (XYZAL ) 5 MG tablet Take one tablet once a day as needed for runny nose 01/07/24   Iva Marty Saltness, MD  linaclotide  (LINZESS ) 72 MCG capsule Take 1 capsule (72 mcg total) by mouth daily before breakfast. 03/13/24   Iva Marty Saltness, MD  methocarbamol  (ROBAXIN ) 500 MG tablet Take 500 mg by mouth every 8 (eight) hours as needed for muscle spasms. 03/11/24   [provider]  montelukast  (SINGULAIR ) 10 MG tablet Take 1 tablet (10 mg total) by mouth at bedtime. 01/07/24   Iva Marty Saltness, MD  ondansetron  (ZOFRAN ) 4 MG tablet Take 1 tablet (4 mg total) by mouth every 8 (eight) hours as needed for nausea or vomiting. 02/21/24   Burnetta Aures, MD  Oxycodone  HCl 10 MG TABS Take 10 mg by mouth every 8 (eight) hours as needed. Neck pain 02/26/24   [provider]  sertraline  (ZOLOFT ) 100 MG tablet Half  qam  for 6 days then 1  qam 05/06/24   Plovsky, Elna, MD  Vonoprazan Fumarate  (VOQUEZNA ) 10 MG TABS Take 1 tablet by mouth daily. 01/07/24   Iva Marty Saltness, MD    Family History Family History  Problem Relation Age of Onset   Allergic rhinitis Father    Asthma Father    Crohn's disease Sister         half sister   Anxiety disorder Maternal Grandmother    Depression Maternal Grandmother    Diverticulitis Paternal Grandmother    Diabetes Other    Heart failure Other    Colon cancer Neg Hx    Celiac disease Neg Hx     Social History Social History[1]   Allergies   Dexamethasone , Amoxicillin, Asa [aspirin], Cefoxitin, and Sulfa antibiotics   Review of Systems Review of Systems Per HPI  Physical Exam Triage Vital Signs ED Triage Vitals  Encounter Vitals Group     BP 07/01/24 1913 127/88     Girls Systolic BP Percentile --  Girls Diastolic BP Percentile --      Boys Systolic BP Percentile --      Boys Diastolic BP Percentile --      Pulse Rate 07/01/24 1913 (!) 118     Resp 07/01/24 1913 16     Temp 07/01/24 1913 98.5 F (36.9 C)     Temp Source 07/01/24 1913 Oral     SpO2 07/01/24 1913 96 %     Weight --      Height --      Head Circumference --      Peak Flow --      Pain Score 07/01/24 1911 7     Pain Loc --      Pain Education --      Exclude from Growth Chart --    No data found.  Updated Vital Signs BP 127/88 (BP Location: Right Arm)   Pulse (!) 118   Temp 98.5 F (36.9 C) (Oral)   Resp 16   LMP 06/22/2024 (Exact Date)   SpO2 96%   Visual Acuity Right Eye Distance:   Left Eye Distance:   Bilateral Distance:    Right Eye Near:   Left Eye Near:    Bilateral Near:     Physical Exam Vitals and nursing note reviewed.  Constitutional:      General: She is not in acute distress.    Appearance: Normal appearance.  HENT:     Head: Normocephalic.  Eyes:     Extraocular Movements: Extraocular movements intact.     Pupils: Pupils are equal, round, and reactive to light.  Cardiovascular:     Rate and Rhythm: Regular rhythm. Tachycardia present.     Pulses: Normal pulses.     Heart sounds: Normal heart sounds.  Pulmonary:     Effort: Pulmonary effort is normal.     Breath sounds: Normal breath sounds.  Musculoskeletal:     Right shoulder:  Normal.     Right upper arm: Swelling (Swelling to the deltoid of the right upper extremity with bruising present.) and tenderness present. No deformity.     Right elbow: Normal.     Right forearm: Normal.     Right wrist: Normal.     Right hand: Swelling (right thumb) and tenderness present. Decreased range of motion. Normal sensation. Normal capillary refill. Normal pulse.     Cervical back: Normal range of motion.  Skin:    General: Skin is warm and dry.      Neurological:     General: No focal deficit present.     Mental Status: She is alert and oriented to person, place, and time.  Psychiatric:        Mood and Affect: Mood normal.        Behavior: Behavior normal.      UC Treatments / Results  Labs (all labs ordered are listed, but only abnormal results are displayed) Labs Reviewed - No data to display  EKG   Radiology No results found.  Procedures Procedures (including critical care time)  Medications Ordered in UC Medications  Tdap (ADACEL) injection 0.5 mL (0.5 mLs Intramuscular Given 07/01/24 1942)    Initial Impression / Assessment and Plan / UC Course  I have reviewed the triage vital signs and the nursing notes.  Pertinent labs & imaging results that were available during my care of the patient were reviewed by me and considered in my medical decision making (see chart for details).  Patient presents for lacerations  to the right upper extremity and right thumb pain after a dog fight.  The laceration is superficial.  There is no oozing or drainage present.  Tdap was updated today.  Will treat patient for infection prophylaxis with clindamycin 300 mg.  Supportive care recommendations were provided and discussed with the patient to include RICE therapy and over-the-counter analgesics.  Discussed indications with patient regarding follow-up to include signs of infection.  Patient was in agreement with this plan of care and verbalizes understanding.  All questions  were answered.  Patient stable for discharge.  Work note was provided.  Final Clinical Impressions(s) / UC Diagnoses   Final diagnoses:  Thumb pain, right  Laceration of right upper extremity, initial encounter  Dog bite, initial encounter     Discharge Instructions      The x-ray of your right thumb is pending.  You will be contacted if the pending test result is abnormal.  You may also call our office tomorrow morning to receive your x-ray results if you have not already been notified. Your Tdap was updated today.  It is good for the next 10 years. You may take over-the-counter Tylenol  as needed for pain or discomfort. RICE therapy, rest, ice, compression, and elevation.  Apply ice to the right thumb into the right upper arm to help with pain, bruising and swelling. A splint has been provided to allow for additional compression and support.  Wear the splint while you are engaged in prolonged or strenuous activity. Monitor the areas of the dog bite for signs of infection.  Seek care if you develop increased redness, foul-smelling drainage from the site, or if you develop fever or chills. Follow-up as needed.     ED Prescriptions     Medication Sig Dispense Auth. Provider   clindamycin (CLEOCIN) 300 MG capsule Take 1 capsule (300 mg total) by mouth 3 (three) times daily for 7 days. 21 capsule Leath-Warren, Etta PARAS, NP      PDMP not reviewed this encounter.     [1]  Social History Tobacco Use   Smoking status: Every Day    Types: Cigarettes    Passive exposure: Never   Smokeless tobacco: Never   Tobacco comments:    Uses vapes  Vaping Use   Vaping status: Never Used  Substance Use Topics   Alcohol use: Yes    Comment: socially    Drug use: No     Gilmer Etta PARAS, NP 07/01/24 2007  "

## 2024-07-01 NOTE — Discharge Instructions (Signed)
 The x-ray of your right thumb is pending.  You will be contacted if the pending test result is abnormal.  You may also call our office tomorrow morning to receive your x-ray results if you have not already been notified. Your Tdap was updated today.  It is good for the next 10 years. You may take over-the-counter Tylenol  as needed for pain or discomfort. RICE therapy, rest, ice, compression, and elevation.  Apply ice to the right thumb into the right upper arm to help with pain, bruising and swelling. A splint has been provided to allow for additional compression and support.  Wear the splint while you are engaged in prolonged or strenuous activity. Monitor the areas of the dog bite for signs of infection.  Seek care if you develop increased redness, foul-smelling drainage from the site, or if you develop fever or chills. Follow-up as needed.

## 2024-07-01 NOTE — ED Triage Notes (Signed)
 Pt got bit in right upper arm and right hand by her own dog when trying to break up her dogs from fighting,

## 2024-07-04 ENCOUNTER — Other Ambulatory Visit: Payer: Self-pay | Admitting: Allergy & Immunology

## 2024-07-08 ENCOUNTER — Ambulatory Visit: Admitting: Acute Care

## 2024-07-08 ENCOUNTER — Encounter: Payer: Self-pay | Admitting: Acute Care

## 2024-07-08 VITALS — BP 122/85 | HR 90 | Temp 97.9°F | Ht 62.0 in | Wt 188.0 lb

## 2024-07-08 DIAGNOSIS — F17290 Nicotine dependence, other tobacco product, uncomplicated: Secondary | ICD-10-CM

## 2024-07-08 DIAGNOSIS — R911 Solitary pulmonary nodule: Secondary | ICD-10-CM

## 2024-07-08 DIAGNOSIS — R918 Other nonspecific abnormal finding of lung field: Secondary | ICD-10-CM | POA: Diagnosis not present

## 2024-07-08 DIAGNOSIS — Z7289 Other problems related to lifestyle: Secondary | ICD-10-CM

## 2024-07-08 DIAGNOSIS — R9389 Abnormal findings on diagnostic imaging of other specified body structures: Secondary | ICD-10-CM

## 2024-07-08 NOTE — Progress Notes (Signed)
 "  History of Present Illness Nicole Rodriguez is a 34 y.o. female  but she has vaped for about 3 years, who presents for a lung nodule consult. She will be followed by Dr. Shelah .  Synopsis Pt. Presents to the ED on 03/19/2024 for difficulty urinating and right lower quadrant pain.She had recently had a C5-6 cervical fusion, and had been on pain medication . A Ct Abdomen and pelvis was done to evaluate for etiology and there was an incidental finding of a 5 mm right middle lobe  pulmonary nodule that was not noted on previous imaging. She was seen by me 03/24/2024 for nodule consult.   She has a history of asthma and uses Advair [address] as needed, along with a nebulizer for illness-related exacerbations. She experiences occasional shortness of breath with activity.   She has been vaping for about three years and previously had benzene exposure while working at [employer]. She lives in a house with a basement but has not checked for radon.   Family history includes lung cancer in her paternal grandfather and breast cancer on her maternal side. She had a benign breast lump removed at age 16 or 23.  07/08/2024 Discussed the use of AI scribe software for clinical note transcription with the patient, who gave verbal consent to proceed.  History of Present Illness Nicole Rodriguez is a 34 year old female who presents for follow-up CT Imaging as surveillance of pulmonary nodules.  She has a history of pulmonary nodules, with a five millimeter right middle lobe nodule and a two millimeter left upper lobe nodule, both identified in 2023. She is considered low risk, and recommendation is that we can stop surveillance, however, with her family history and her vaping history we will do an annual follow up scan.   No new or worsening breathing issues are reported.  She has significantly reduced her vaping habit by 85% and is using Zyn pouches as an alternative. We discussed  utilizing an app to help manage cravings, which provides activities and exercises to distract from vaping urges.  There is a family history of cancer, which is relevant to her current pulmonary concerns. She understands to call to be seen sooner for any unintentional weight loss or blood in the sputum.      Test Results: 06/21/2024 CT Chest 5 mm right middle lobe pulmonary nodule identified on the 03/19/2024 exam is stable in the interval and new since the 08/06/2021 exam. 2 mm anterior left upper lobe nodule is also new since the 2023 exam. No follow-up needed if patient is low-risk (and has no known or suspected primary neoplasm). Non-contrast chest CT can be considered in 12 months if patient is high-risk.  CT Abdomen and Pelvis 03/19/2024 Lower chest: 5 mm nodule is noted in right middle lobe.     Latest Ref Rng & Units 03/19/2024   11:05 AM 02/18/2024    3:00 PM 01/03/2024    4:55 PM  CBC  WBC 4.0 - 10.5 K/uL 9.8  11.5  11.5   Hemoglobin 12.0 - 15.0 g/dL 86.3  85.8  87.2   Hematocrit 36.0 - 46.0 % 38.7  42.2  38.3   Platelets 150 - 400 K/uL 330  371  326        Latest Ref Rng & Units 03/19/2024   11:05 AM 02/18/2024    3:00 PM 09/02/2023    2:15 PM  BMP  Glucose 70 - 99 mg/dL 75  92  89   BUN 6 - 20 mg/dL 8  7  12    Creatinine 0.44 - 1.00 mg/dL 9.18  9.29  9.15   Sodium 135 - 145 mmol/L 141  136  137   Potassium 3.5 - 5.1 mmol/L 3.6  3.7  3.9   Chloride 98 - 111 mmol/L 105  100  102   CO2 22 - 32 mmol/L 24  24  24    Calcium 8.9 - 10.3 mg/dL 9.9  9.6  9.3     BNP    Component Value Date/Time   BNP 5.0 08/06/2021 1317    ProBNP No results found for: PROBNP  PFT    Component Value Date/Time   FEV1PRE 2.86 01/29/2019 1111   FEV1POST 2.75 01/29/2019 1111   FVCPRE 3.29 01/29/2019 1111   FVCPOST 3.31 01/29/2019 1111   TLC 5.08 12/14/2017 0854   DLCOUNC 21.63 12/14/2017 0854   PREFEV1FVCRT 87 01/29/2019 1111   PSTFEV1FVCRT 83 01/29/2019 1111    CT CHEST WO  CONTRAST Result Date: 07/06/2024 CLINICAL DATA:  Pulmonary nodule. EXAM: CT CHEST WITHOUT CONTRAST TECHNIQUE: Multidetector CT imaging of the chest was performed following the standard protocol without IV contrast. RADIATION DOSE REDUCTION: This exam was performed according to the departmental dose-optimization program which includes automated exposure control, adjustment of the mA and/or kV according to patient size and/or use of iterative reconstruction technique. COMPARISON:  CT stone study 03/19/2024.  Chest CT 08/06/2021 FINDINGS: Cardiovascular: The heart size is normal. No substantial pericardial effusion. No thoracic aortic aneurysm. Mediastinum/Nodes: No mediastinal lymphadenopathy. No evidence for gross hilar lymphadenopathy although assessment is limited by the lack of intravenous contrast on the current study. The esophagus has normal imaging features. There is no axillary lymphadenopathy. Lungs/Pleura: 5 mm right middle lobe pulmonary nodule identified on the 03/19/2024 exam is stable in the interval and new since the 08/06/2021 exam. 2 mm anterior left upper lobe nodule on 42/4 is also new since the 2023 exam. No other suspicious pulmonary nodule or mass. No focal airspace consolidation. There is no evidence of pleural effusion. Upper Abdomen: Visualized portion of the upper abdomen shows no acute findings. Musculoskeletal: No worrisome lytic or sclerotic osseous abnormality. IMPRESSION: 1. 5 mm right middle lobe pulmonary nodule identified on the 03/19/2024 exam is stable in the interval and new since the 08/06/2021 exam. 2 mm anterior left upper lobe nodule is also new since the 2023 exam. No follow-up needed if patient is low-risk (and has no known or suspected primary neoplasm). Non-contrast chest CT can be considered in 12 months if patient is high-risk. This recommendation follows the consensus statement: Guidelines for Management of Incidental Pulmonary Nodules Detected on CT Images: From the  Fleischner Society 2017; Radiology 2017; 284:228-243. Electronically Signed   By: Camellia Candle M.D.   On: 07/06/2024 06:47   DG Finger Thumb Right Result Date: 07/01/2024 EXAM: 3 VIEW(S) XRAY OF THE FINGER(S) 07/01/2024 07:43:09 PM COMPARISON: None available. CLINICAL HISTORY: pain and swelling s/p dog bite incident FINDINGS: BONES AND JOINTS: No acute fracture. No malalignment. SOFT TISSUES: Soft tissue swelling along the radial aspect of the thumb. No evidence of foreign body. IMPRESSION: 1. Soft tissue swelling along the radial aspect of the thumb, likely related to dog bite. 2. No evidence of foreign body. Electronically signed by: Greig Pique MD 07/01/2024 08:57 PM EST RP Workstation: [id number]     Past medical hx Past Medical History:  Diagnosis Date   Asthma    Bipolar disorder (HCC)  Generalized anxiety disorder    GERD (gastroesophageal reflux disease)    Phreesia 08/17/2020   IBS (irritable bowel syndrome)    Plantar fasciitis 10/15/2018   Rectal bleeding 09/24/2019   Social anxiety disorder      Social History[1]  Ms.Kochan reports that she has been smoking cigarettes. She has never been exposed to tobacco smoke. She has never used smokeless tobacco. She reports current alcohol use. She reports that she does not use drugs.  Tobacco Cessation: Ready to quit: Not Answered Counseling given: Not Answered Tobacco comments: Uses vapes Current vaper, working on quitting  Past surgical hx, Family hx, Social hx all reviewed.  Current Outpatient Medications on File Prior to Visit  Medication Sig   AIRSUPRA  90-80 MCG/ACT AERO Inhale 2 puffs into the lungs as needed (every four to six hours for cough, wheeze, shortness of breath. Rinse mouth).   albuterol  (PROVENTIL ) (2.5 MG/3ML) 0.083% nebulizer solution Take 3 mLs (2.5 mg total) by nebulization every 6 (six) hours as needed for wheezing or shortness of breath.   Azelastine  HCl 137 MCG/SPRAY SOLN Place 2 sprays in each  nostril twice a day as needed for runny nose/drainage down throat   buPROPion  (WELLBUTRIN  SR) 200 MG 12 hr tablet Take 1 tablet (200 mg total) by mouth 2 (two) times daily.   clindamycin  (CLEOCIN ) 300 MG capsule Take 1 capsule (300 mg total) by mouth 3 (three) times daily for 7 days.   clonazePAM  (KLONOPIN ) 1 MG tablet 2 qhs   fluticasone  (FLONASE ) 50 MCG/ACT nasal spray SPRAY 2 SPRAYS INTO EACH NOSTRIL EVERY DAY AS NEEDED FOR STUFFY NOSE   fluticasone -salmeterol (ADVAIR DISKUS) 250-50 MCG/ACT AEPB Inhale 1 puff twice a day to help prevent cough and wheeze.  Rinse mouth out afterwards   gabapentin (NEURONTIN) 300 MG capsule Take 300 mg by mouth 3 (three) times daily.   levocetirizine (XYZAL ) 5 MG tablet Take one tablet once a day as needed for runny nose   linaclotide  (LINZESS ) 72 MCG capsule Take 1 capsule (72 mcg total) by mouth daily before breakfast.   montelukast  (SINGULAIR ) 10 MG tablet Take 1 tablet (10 mg total) by mouth at bedtime.   phentermine  (ADIPEX-P ) 37.5 MG tablet Take 37.5 mg by mouth daily.   sertraline  (ZOLOFT ) 100 MG tablet Half  qam  for 6 days then 1  qam   VOQUEZNA  10 MG TABS Take 1 tablet by mouth once daily   No current facility-administered medications on file prior to visit.     Allergies[2]  Review Of Systems:  Constitutional:   No  weight loss, night sweats,  Fevers, chills, fatigue, or  lassitude.  HEENT:   No headaches,  Difficulty swallowing,  Tooth/dental problems, or  Sore throat,                No sneezing, itching, ear ache, nasal congestion, post nasal drip,   CV:  No chest pain,  Orthopnea, PND, swelling in lower extremities, anasarca, dizziness, palpitations, syncope.   GI  No heartburn, indigestion, abdominal pain, nausea, vomiting, diarrhea, change in bowel habits, loss of appetite, bloody stools.   Resp: No shortness of breath with exertion or at rest.  No excess mucus, no productive cough,  No non-productive cough,  No coughing up of blood.  No  change in color of mucus.  No wheezing.  No chest wall deformity  Skin: no rash or lesions.  GU: no dysuria, change in color of urine, no urgency or frequency.  No flank pain,  no hematuria   MS:  No joint pain or swelling.  No decreased range of motion.  No back pain.  Psych:  No change in mood or affect. No depression or anxiety.  No memory loss.   Vital Signs BP 112/84   Pulse 90   Temp 97.9 F (36.6 C) (Oral)   Ht 5' 2 (1.575 m)   Wt 188 lb (85.3 kg)   LMP 06/22/2024 (Exact Date)   SpO2 99%   BMI 34.39 kg/m    Physical Exam: Physical Exam GENERAL: No distress, alert and oriented times 3. EARS NOSE THROAT: No sinus tenderness, tympanic membranes clear, pale nasal mucosa, no oral exudate, no post nasal drip, no lymphadenopathy. CHEST: No wheeze, rales, dullness, no accessory muscle use, no nasal flaring, no sternal retractions. CARDIAC: S1, S2, regular rate and rhythm, no murmur. ABDOMINAL: Soft, non tender. ND, BS present, EXTREMITIES: No clubbing, cyanosis, edema. No obvious deformities NEUROLOGICAL: Normal strength. Alert and oriented x 3, MAE x 4 SKIN: No rashes, warm and dry. No obvious skin lesions PSYCHIATRIC: Normal mood and behavior.   Assessment/Plan  Assessment and Plan Assessment & Plan Pulmonary nodules Stable 5 mm right middle lobe and 2 mm left upper lobe nodules, new since 2023.  Low malignancy risk, but family history of cancer and vaping increase risk. - Scheduled follow-up CT scan in one year.  Nicotine dependence (vaping) Vaping reduced by 85%. Discussed 'Quit Start' app for cravings and triggers. - Encouraged continued reduction in vaping. - Recommended 'Quit Start' app use. - Counseled x 3 minutes on smoking cessation today.  - Scheduled video visit follow-up to review progress after follow up CT Chest.    I spent 15 minutes dedicated to the care of this patient on the date of this encounter to include pre-visit review of records,  face-to-face time with the patient discussing conditions above, post visit ordering of testing, clinical documentation with the electronic health record, making appropriate referrals as documented, and communicating necessary information to the patient's healthcare team.    Lauraine JULIANNA Lites, NP 07/08/2024  3:43 PM             [1]  Social History Tobacco Use   Smoking status: Every Day    Types: Cigarettes    Passive exposure: Never   Smokeless tobacco: Never   Tobacco comments:    Uses vapes  Vaping Use   Vaping status: Never Used  Substance Use Topics   Alcohol use: Yes    Comment: socially    Drug use: No  [2]  Allergies Allergen Reactions   Dexamethasone  Other (See Comments)    Flares acid reflux   Amoxicillin Hives   Asa [Aspirin] Hives   Cefoxitin Itching and Rash   Sulfa Antibiotics Hives   "

## 2024-07-08 NOTE — Patient Instructions (Signed)
 It is good to see you today. Your scan shows stable nodules in the last 76-month interval. This is reassuring. We will do a 64-month follow-up scan just as surveillance to ensure these nodules remain stable. You will get a call to get this scheduled closer to the time. Follow-up with me 1 to 2 weeks after the scan to review results through a video visit. Very proud of you for decreasing your vaping by 85%. Keep up the great work and see if you can quit altogether.  Smoking Cessation  Tips to Quit:  Pick a Quit Day within the next week.  Remove temptations: toss cigarettes, lighters, ashtrays.  Tell someone you trust for support.  Avoid triggers like stress, boredom, or being around smokers.  Use healthy replacements: water , gum, walking, deep breaths.  Stay busy with hobbies, music, drawing, or exercise.  Be patient with yourself--slipping once doesnt mean failure.   [place] Smoking/Nicotine Cessation Resources  If youre ready to quit TODAY, our virtual care team is available to start your journey to a tobacco free life. Appointments are available from 8 a.m. to 8 p.m. Monday to Friday. Most health insurance plans will cover some level of tobacco cessation visits and medications. To make a virtual appointment and access other resources, follow the link below.  severties.nl   You can receive free nicotine replacement therapy (patches, gum, or mints) by calling 1-800-QUIT NOW. Please call so we can get you on the path to becoming a non-smoker. I know it is hard, but you can do this!  The American Lung Association offers Freedom From Smoking Programs Self guided or group programs offered, check their website for free virtual programs available to [facility] residents Lung Helpline at 1-800-LUNGUSA  http://keith.biz/  [employer] Offers tools and tips to quit smoking.  Free quitSTART app:   Monitor progress, manage cravings, access tools,  and more with the app.  portablegrid.se  Hypnosis for smoking cessation  [employer] [phone]  Acupuncture for smoking cessation  [employer] [phone]

## 2024-07-09 ENCOUNTER — Encounter (HOSPITAL_COMMUNITY): Payer: Self-pay | Admitting: Psychiatry

## 2024-07-09 ENCOUNTER — Other Ambulatory Visit: Payer: Self-pay

## 2024-07-09 ENCOUNTER — Ambulatory Visit (HOSPITAL_COMMUNITY): Admitting: Psychiatry

## 2024-07-09 VITALS — BP 147/97 | HR 94 | Ht 62.0 in | Wt 186.0 lb

## 2024-07-09 DIAGNOSIS — F320 Major depressive disorder, single episode, mild: Secondary | ICD-10-CM

## 2024-07-09 DIAGNOSIS — F3181 Bipolar II disorder: Secondary | ICD-10-CM

## 2024-07-09 MED ORDER — BUPROPION HCL ER (SR) 200 MG PO TB12: 200.0000 mg | ORAL_TABLET | Freq: Two times a day (BID) | ORAL | 4 refills | Status: AC

## 2024-07-09 MED ORDER — CLONAZEPAM 1 MG PO TABS: ORAL_TABLET | ORAL | 4 refills | Status: AC

## 2024-07-09 NOTE — Progress Notes (Signed)
 [facility] MD/PA/NP OP Progress Note  07/09/2024 4:50 PM Nicole Rodriguez  MRN:  [id number] Interview was conducted in person and I verified that I was speaking with the correct person using two identifiers. I discussed the limitations of evaluation and management by telemedicine and  the availability of in person appointments. Patient expressed understanding and agreed to proceed. Participants in the visit: patient (location - home); physician (location - home office).  Chief Complaint:  Panic attacks.  Landry Seat.  Visit Diagnosis:    Today the she is doing very well.  It turns out that the Zoloft  that we changed her to cause problems.  It caused nausea and it affected her sex drive.  She went back to Wellbutrin  200 mg slow release 2.  But the most important intervention is that her daughter is about got into see Zane Glatter for therapy.  Her daughter was put on Prozac and now is doing much better.  Patient feels good about her daughter being better and she has a good relationship with making her partner.  The patient is sleeping much better her appetite is good she is back to work.  The patient likes her work her great deal.  She feels good being back to work her daughter is better and life is much better for her.  She has good holidays.  I connected with Laymon Chute on 07/09/2024 at  4:30 PM EST by telephone and verified that I am speaking with the correct person using two identifiers.  Location: Patient: home Provider: office   I discussed the limitations, risks, security and privacy concerns of performing an evaluation and management service by telephone and the availability of in person appointments. I also discussed with the patient that there may be a patient responsible charge related to this service. The patient expressed understanding and agreed to proceed.      I discussed the assessment and treatment plan with the patient. The patient was provided an opportunity to ask questions  and all were answered. The patient agreed with the plan and demonstrated an understanding of the instructions.   The patient was advised to call back or seek an in-person evaluation if the symptoms worsen or if the condition fails to improve as anticipated.  I provided 30 minutes of non-face-to-face time during this encounter.   Elna LILLETTE Lo, MD    ICD-10-CM   1. Bipolar 2 disorder (HCC)  F31.81 clonazePAM  (KLONOPIN ) 1 MG tablet      Past Psychiatric History: Please see intake H&P.  Past Medical History:  Past Medical History:  Diagnosis Date   Asthma    Bipolar disorder (HCC)    Generalized anxiety disorder    GERD (gastroesophageal reflux disease)    Phreesia 08/17/2020   IBS (irritable bowel syndrome)    Plantar fasciitis 10/15/2018   Rectal bleeding 09/24/2019   Social anxiety disorder     Past Surgical History:  Procedure Laterality Date   ANTERIOR CERVICAL DECOMP/DISCECTOMY FUSION N/A 02/21/2024   Procedure: ANTERIOR CERVICAL DECOMPRESSION/DISCECTOMY FUSION CERVICAL FIVE-CERVICAL SIX;  Surgeon: Burnetta Aures, MD;  Location: [facility];  Service: Orthopedics;  Laterality: N/A;   APPENDECTOMY     BREAST LUMPECTOMY     COLONOSCOPY WITH PROPOFOL  N/A 12/03/2019   Procedure: COLONOSCOPY WITH PROPOFOL ;  Surgeon: Janalyn Keene NOVAK, MD;  Location: [facility] ENDOSCOPY;  Service: Endoscopy;  Laterality: N/A;   CYSTOSCOPY W/ URETERAL STENT PLACEMENT Right 08/31/2021   Procedure: CYSTOSCOPY WITH BILATERAL RETROGRADE PYELOGRAM;  Surgeon: Roseann Adine PARAS., MD;  Location: [facility];  Service: Urology;  Laterality: Right;   ESOPHAGOGASTRODUODENOSCOPY (EGD) WITH PROPOFOL  N/A 12/03/2019   Procedure: ESOPHAGOGASTRODUODENOSCOPY (EGD) WITH PROPOFOL ;  Surgeon: Janalyn Keene NOVAK, MD;  Location: [facility] ENDOSCOPY;  Service: Endoscopy;  Laterality: N/A;   eunlar relocation Left 11/07/2022   FOOT SURGERY     URETEROSCOPY Right 08/31/2021   Procedure: URETEROSCOPY;  Surgeon: Roseann Adine PARAS., MD;  Location: [facility];  Service: Urology;  Laterality: Right;    Family Psychiatric History: Reviewed.  Family History:  Family History  Problem Relation Age of Onset   Allergic rhinitis Father    Asthma Father    Crohn's disease Sister        half sister   Anxiety disorder Maternal Grandmother    Depression Maternal Grandmother    Diverticulitis Paternal Grandmother    Diabetes Other    Heart failure Other    Colon cancer Neg Hx    Celiac disease Neg Hx     Social History:  Social History   Socioeconomic History   Marital status: Married    Spouse name: Not on file   Number of children: 1   Years of education: Not on file   Highest education level: Not on file  Occupational History   Not on file  Tobacco Use   Smoking status: Every Day    Types: Cigarettes    Passive exposure: Never   Smokeless tobacco: Never   Tobacco comments:    Uses vapes  Vaping Use   Vaping status: Never Used  Substance and Sexual Activity   Alcohol use: Yes    Comment: socially    Drug use: No   Sexual activity: Yes    Birth control/protection: None  Other Topics Concern   Not on file  Social History Narrative   Daughter 71 years old.   Social Drivers of Health   Tobacco Use: High Risk (07/09/2024)   Patient History    Smoking Tobacco Use: Every Day    Smokeless Tobacco Use: Never    Passive Exposure: Never  Financial Resource Strain: Not on file  Food Insecurity: Low Risk (06/03/2024)   Received from [facility]   Epic    Within the past 12 months, you worried that your food would run out before you got money to buy more: Never true    Within the past 12 months, the food you bought just didn't last and you didn't have money to get more. : Never true  Transportation Needs: No Transportation Needs (06/03/2024)   Received from [facility]    In the past 12 months, has lack of reliable transportation kept you from medical appointments, meetings, work or from  getting things needed for daily living? : No  Physical Activity: Not on file  Stress: Not on file  Social Connections: Not on file  Depression (PHQ2-9): Low Risk (03/08/2022)   Depression (PHQ2-9)    PHQ-2 Score: 0  Alcohol Screen: Not on file  Housing: Low Risk (06/03/2024)   Received from [facility]   Epic    What is your living situation today?: I have a steady place to live    Think about the place you live. Do you have problems with any of the following? Choose all that apply:: None/None on this list  Utilities: Low Risk (06/03/2024)   Received from [facility]   Utilities    In the past 12 months has the electric, gas, oil, or water  company threatened to  shut off services in your home? : No  Health Literacy: Not on file    Allergies:  Allergies  Allergen Reactions   Dexamethasone  Other (See Comments)    Flares acid reflux   Amoxicillin Hives   Asa [Aspirin] Hives   Cefoxitin Itching and Rash   Sulfa Antibiotics Hives    Metabolic Disorder Labs: Lab Results  Component Value Date   HGBA1C 5.2 02/01/2017   No results found for: PROLACTIN Lab Results  Component Value Date   CHOL 159 02/01/2017   TRIG 85 02/01/2017   HDL 44 02/01/2017   CHOLHDL 3.6 02/01/2017   LDLCALC 98 02/01/2017   LDLCALC 112 (H) 09/14/2016   Lab Results  Component Value Date   TSH 1.500 09/14/2016    Therapeutic Level Labs: No results found for: LITHIUM No results found for: VALPROATE No results found for: CBMZ  Current Medications: Current Outpatient Medications  Medication Sig Dispense Refill   AIRSUPRA  90-80 MCG/ACT AERO Inhale 2 puffs into the lungs as needed (every four to six hours for cough, wheeze, shortness of breath. Rinse mouth). 10.7 g 1   albuterol  (PROVENTIL ) (2.5 MG/3ML) 0.083% nebulizer solution Take 3 mLs (2.5 mg total) by nebulization every 6 (six) hours as needed for wheezing or shortness of breath. 75 mL 3   Azelastine  HCl 137 MCG/SPRAY SOLN Place 2  sprays in each nostril twice a day as needed for runny nose/drainage down throat 30 mL 3   buPROPion  (WELLBUTRIN  SR) 200 MG 12 hr tablet Take 1 tablet (200 mg total) by mouth 2 (two) times daily. 60 tablet 4   clonazePAM  (KLONOPIN ) 1 MG tablet 2 qhs 60 tablet 4   fluticasone  (FLONASE ) 50 MCG/ACT nasal spray SPRAY 2 SPRAYS INTO EACH NOSTRIL EVERY DAY AS NEEDED FOR STUFFY NOSE 16 g 5   fluticasone -salmeterol (ADVAIR DISKUS) 250-50 MCG/ACT AEPB Inhale 1 puff twice a day to help prevent cough and wheeze.  Rinse mouth out afterwards 1 each 5   gabapentin (NEURONTIN) 300 MG capsule Take 300 mg by mouth 3 (three) times daily.     levocetirizine (XYZAL ) 5 MG tablet Take one tablet once a day as needed for runny nose 30 tablet 5   linaclotide  (LINZESS ) 72 MCG capsule Take 1 capsule (72 mcg total) by mouth daily before breakfast. 90 capsule 1   montelukast  (SINGULAIR ) 10 MG tablet Take 1 tablet (10 mg total) by mouth at bedtime. 30 tablet 5   phentermine  (ADIPEX-P ) 37.5 MG tablet Take 37.5 mg by mouth daily.     sertraline  (ZOLOFT ) 100 MG tablet Half  qam  for 6 days then 1  qam 30 tablet 4   VOQUEZNA  10 MG TABS Take 1 tablet by mouth once daily 30 tablet 0   No current facility-administered medications for this visit.      Psychiatric Specialty Exam: Review of Systems  Psychiatric/Behavioral:  The patient is nervous/anxious.   All other systems reviewed and are negative.   Blood pressure (!) 147/97, pulse 94, height 5' 2 (1.575 m), weight 186 lb (84.4 kg), last menstrual period 06/22/2024.Body mass index is 34.02 kg/m.  General Appearance: NA  Eye Contact:  NA  Speech:  Clear and Coherent and Normal Rate  Volume:  Normal  Mood:  Anxious  Affect:  NA  Thought Process:  Goal Directed  Orientation:  Full (Time, Place, and Person)  Thought Content: Rumination   Suicidal Thoughts:  No  Homicidal Thoughts:  No  Memory:  Immediate;   Good  Recent;   Good Remote;   Good  Judgement:  Good   Insight:  Fair  Psychomotor Activity:  NA  Concentration:  Concentration: Fair  Recall:  Good  Fund of Knowledge: Good  Language: Good  Akathisia:  Negative  Handed:  Right  AIMS (if indicated): not done  Assets:  Communication Skills Desire for Improvement Financial Resources/Insurance Housing Social Support Talents/Skills  ADL's:  Intact  Cognition: WNL  Sleep:  Good   Screenings: GAD-7    Flowsheet Row Office Visit from 06/18/2019 in [place] Office Visit from 06/05/2019 in [place] Office Visit from 04/01/2019 in [facility]  Total GAD-7 Score 20 19 0   PHQ2-9    Flowsheet Row Office Visit from 03/08/2022 in [facility] Office Visit from 06/30/2021 in [facility] Video Visit from 11/15/2020 in [facility] Office Visit from 08/20/2020 in [facility] Office Visit from 06/18/2019 in [facility]  PHQ-2 Total Score 0 0 0 0 5  PHQ-9 Total Score 0 -- -- 0 20   Flowsheet Row UC from 07/01/2024 in [facility] ED from 03/19/2024 in [facility] Emergency Department at [facility] Admission (Discharged) from 02/21/2024 in [place] PERIOPERATIVE AREA  C-SSRS RISK CATEGORY No Risk No Risk No Risk     Assessment and Plan:         This patient's diagnosis is major depression and now is in remission.  She will continue taking Wellbutrin .  Her second problem is insomnia but that is much better with 2 mg of Klonopin .  The patient is functioning very well.  She will return to see me in 5 months.  She will call back to get an appointment.   Dx: Bipolar 2 disorder rapid cycling; Mixed anxiety disorder (panic disorder' GAD, social anxiety)   Plan:  Continue Lamictal  to 200 mg at HS, Latuda  40 mg at HS, paroxetine  20 mg in PM, trazodone  50 mg prn sleep and bupropion  SR 200 mg bid for fatigue. I will stop lorazepam  2 mg  prn anxiety (up to tid) and start clonazepam  1 mg bid scheduled initially (then advised to start using it prn after anxiety decreases). We shall write her an excuse from work note for 3/26-4/18/22 period.  Next appointment with a new provider in 1 month. The plan was discussed with patient who had an opportunity to ask questions and these were all answered. I spend 15 minutes in phone consultation with the patient.    Elna LILLETTE Lo, MD 07/09/2024, 4:50 PM

## 2024-07-23 ENCOUNTER — Encounter: Payer: Self-pay | Admitting: Allergy & Immunology

## 2024-07-23 ENCOUNTER — Ambulatory Visit: Admitting: Allergy & Immunology

## 2024-08-05 ENCOUNTER — Other Ambulatory Visit: Payer: Self-pay | Admitting: Allergy & Immunology
